# Patient Record
Sex: Male | Born: 1970
Health system: Southern US, Community
[De-identification: ages and names within clinical notes are randomized; demographics above are authoritative.]

## PROBLEM LIST (undated history)

## (undated) DIAGNOSIS — Z8709 Personal history of other diseases of the respiratory system: Secondary | ICD-10-CM

## (undated) DIAGNOSIS — D649 Anemia, unspecified: Secondary | ICD-10-CM

## (undated) DIAGNOSIS — Z992 Dependence on renal dialysis: Secondary | ICD-10-CM

## (undated) DIAGNOSIS — D573 Sickle-cell trait: Secondary | ICD-10-CM

## (undated) DIAGNOSIS — I729 Aneurysm of unspecified site: Secondary | ICD-10-CM

## (undated) DIAGNOSIS — K219 Gastro-esophageal reflux disease without esophagitis: Secondary | ICD-10-CM

## (undated) DIAGNOSIS — K5900 Constipation, unspecified: Secondary | ICD-10-CM

## (undated) DIAGNOSIS — N186 End stage renal disease: Secondary | ICD-10-CM

## (undated) DIAGNOSIS — I10 Essential (primary) hypertension: Secondary | ICD-10-CM

## (undated) HISTORY — PX: AV FISTULA PLACEMENT: SHX1204

## (undated) HISTORY — DX: End stage renal disease: N18.6

## (undated) HISTORY — PX: OTHER SURGICAL HISTORY: SHX169

## (undated) HISTORY — DX: Essential (primary) hypertension: I10

## (undated) HISTORY — DX: Dependence on renal dialysis: Z99.2

## (undated) HISTORY — DX: Aneurysm of unspecified site: I72.9

## (undated) HISTORY — PX: COLONOSCOPY: SHX174

## (undated) HISTORY — DX: Anemia, unspecified: D64.9

---

## 2004-02-20 ENCOUNTER — Inpatient Hospital Stay (HOSPITAL_COMMUNITY): Admission: EM | Admit: 2004-02-20 | Discharge: 2004-02-25 | Payer: Self-pay | Admitting: *Deleted

## 2004-02-23 DIAGNOSIS — Z992 Dependence on renal dialysis: Secondary | ICD-10-CM

## 2004-02-23 HISTORY — DX: Dependence on renal dialysis: Z99.2

## 2004-11-02 ENCOUNTER — Ambulatory Visit (HOSPITAL_COMMUNITY): Admission: RE | Admit: 2004-11-02 | Discharge: 2004-11-02 | Payer: Self-pay | Admitting: Vascular Surgery

## 2007-02-15 ENCOUNTER — Ambulatory Visit: Payer: Self-pay | Admitting: *Deleted

## 2007-02-15 DIAGNOSIS — I729 Aneurysm of unspecified site: Secondary | ICD-10-CM

## 2007-02-15 HISTORY — DX: Aneurysm of unspecified site: I72.9

## 2007-05-22 ENCOUNTER — Ambulatory Visit: Payer: Self-pay | Admitting: Vascular Surgery

## 2007-08-07 ENCOUNTER — Ambulatory Visit: Payer: Self-pay | Admitting: Surgery

## 2007-08-07 ENCOUNTER — Ambulatory Visit (HOSPITAL_COMMUNITY): Admission: RE | Admit: 2007-08-07 | Discharge: 2007-08-07 | Payer: Self-pay | Admitting: Surgery

## 2007-08-14 ENCOUNTER — Ambulatory Visit: Payer: Self-pay | Admitting: Vascular Surgery

## 2008-08-04 ENCOUNTER — Observation Stay (HOSPITAL_COMMUNITY): Admission: AD | Admit: 2008-08-04 | Discharge: 2008-08-05 | Payer: Self-pay | Admitting: Internal Medicine

## 2008-10-16 ENCOUNTER — Ambulatory Visit: Payer: Self-pay | Admitting: *Deleted

## 2009-06-25 ENCOUNTER — Ambulatory Visit: Payer: Self-pay | Admitting: Vascular Surgery

## 2009-07-30 ENCOUNTER — Ambulatory Visit: Payer: Self-pay | Admitting: Vascular Surgery

## 2010-06-29 ENCOUNTER — Ambulatory Visit: Payer: Self-pay | Admitting: Vascular Surgery

## 2011-03-08 NOTE — Assessment & Plan Note (Signed)
OFFICE VISIT   KOLVIN, [name]  DOB:  [birth year]                                       07/30/2009  [id number]   I saw the patient in the office today for continued followup of his  right upper arm AV fistula.  I had evaluated these on 06/25/2009.  He  had some large aneurysms of his right upper arm AV fistula, and I  favored placing new access in his left arm and then once this was usable  addressing the aneurysms and essentially excising them.  However, the  patient states that he has had such good luck with his fistula, he was  reluctant to do anything about this.  He takes meticulously good care of  his fistula and keeps a very close eye on the aneurysms.   He comes in today to have his fistula checked again.  The areas of  superficial ulceration over the aneurysms appear to be gradually  improving.  There is no erythema or drainage.  The fistula has an  excellent thrill.   He will continue to keep the fistula well protected and to apply  bacitracin to the small eschars and keep them covered.  Hopefully these  will gradually contract and heal in and he can continue to use his  fistula.  I have explained that certainly if these increase in size at  all, we would need to proceed with placement of new access and excise  these large aneurysms.  I plan on seeing him back in 3 months.  He knows  to call sooner if he has problems.   Judeth Cornfield. Scot Dock, M.D.  Electronically Signed   CSD/MEDQ  D:  07/30/2009  T:  07/31/2009  Job:  [id number]

## 2011-03-08 NOTE — Assessment & Plan Note (Signed)
OFFICE VISIT   STRAN, BECTON  DOB:  05-08-1971                                       08/14/2007  [id number]   Patient is a 40 year old male patient with end-stage renal disease with  a right upper arm AV fistula which I  created in 2005.  The fistula has  functioned nicely but has become aneurysmally dilated in a few areas.  We have evaluated him twice in the past six months for that problem.  Apparently he had some superficial infection over one of the areas of  dilatation but has had no bleeding.  He was treated with Levaquin via a  Port-A-Cath.   PHYSICAL EXAMINATION:  He has three areas of aneurysmal dilatation in  the right upper arm fistula, which does have an excellent pulse and  palpable thrill.  There is no evidence of steal distally.  There is one  area of very slight excoriation at the apex of one of these aneurysms,  measuring about 3 x 4 mm, which is healing.  There is no evidence of any  active infection at this time.  No purulent drainage or cellulitis.   I discussed at length with the patient the options, including creating a  fistula in the contralateral left upper arm versus trying to get some  more use out of this fistula because of his young age, and he would like  to continue to use this fistula.  We discussed the very small incidence  of bleeding from this, and he understands that and would like to  continue to utilize this fistula for  the time being.  If he should change his feelings about this or if renal  physicians would like [place] to proceed with a left upper arm fistula, would  be happy to do so.   Nelda Severe Kellie Simmering, M.D.  Electronically Signed   JDL/MEDQ  D:  08/14/2007  T:  08/15/2007  Job:  [id number]   cc:   Sherril Croon, M.D.

## 2011-03-08 NOTE — Assessment & Plan Note (Signed)
OFFICE VISIT   [name], [name]  DOB:  1971-07-16                                       10/16/2008  [id number]   The patient is a 40 year old gentleman on chronic hemodialysis.  He had  a right upper arm arteriovenous fistula created in 2005 with revision  for competing branches in January 2006.   He is referred today for an evaluation due to an area of ulceration over  his fistula.  No history of bleeding.  Also, this has become quite  hypertrophied.   Evaluation reveals a well-appearing 40 year old [place] American male.  Alert and oriented.  No distress.  His right upper arm does reveal a  large hypertrophied fistula.  Two small areas of eschar noted over this.  They do not appear to erode deeply.  No history of bleeding.  No recent  bleeding evident.   Recommend cannulation away from these sites to allow the skin to heal.  Moving needles around.  No indication at this time for ligation of the  fistula as this is functioning well.   Dorothea Glassman, M.D.  Electronically Signed   PGH/MEDQ  D:  10/16/2008  T:  10/20/2008  Job:  [id number]   cc:   Sherril Croon, M.D.  [place]

## 2011-03-08 NOTE — Assessment & Plan Note (Signed)
OFFICE VISIT   [name], [name]  DOB:  1971-07-07                                       06/25/2009  [id number]   I saw the patient in the office today to evaluate the large aneurysms of  his right upper arm AV fistula.  This was placed in 2005.  He has  developed a superficial ulceration over two of the aneurysms that are  located more centrally.  He states that the fistula has been working  well and he has never really had any problems with this before this.  He  states that he has had the ulcers for about 2 weeks.   PAST MEDICAL HISTORY:  Significant for chronic kidney disease and  hypertension.  He denies any history of diabetes, history of previous  myocardial infarction or history of congestive heart failure.  He also  has secondary hyperparathyroidism and obesity.   SOCIAL HISTORY:  He has one child.  He works for the [employer] division of [employer].  He does not use tobacco.   REVIEW OF SYSTEMS:  Is unremarkable and is documented on the medical  history form of his chart.   PHYSICAL EXAMINATION:  This is a pleasant 40 year old gentleman who  appears his stated age.  Blood pressure is 99/66, heart rate is 86.  Lungs are clear bilaterally to auscultation.  On cardiac exam he has a  regular rate and rhythm.  His fistula has several large aneurysms, the  two more centrally located aneurysms have some superficial ulceration  overlying them.  There is no real eschar just some superficial loss of  the epidermis.  I do not see any drainage or erythema to suggest  infection.   Given the large aneurysms I would favor working on new access in the  left arm and then once this can be used we could ligate the fistula on  the right and address the aneurysms.  I have explained that the other  option would be to go ahead and ligate the fistula now and place a  catheter and place a new graft in the right arm soon after.  I  have  explained that I think there certainly is some risk of bleeding from his  aneurysms and we discussed the importance of applying pressure if he  developed any bleeding associated with these aneurysms.   The patient is very reluctant to give up on the fistula at this point  and would like at least some time to see if these superficial ulcers  heal on their own.  I do not think that this is totally unreasonable and  we had a long discussion about the importance of proper wound care  including the use of xeroform or Vaseline gauze to prevent the dressing  from sticking to the aneurysms and also trying to avoid the use of tape.  Certainly I do not think it would be a good idea to cannulate in this  area.  I plan on seeing him back in 2 weeks to be sure that these are  improving.  If they worsen then I think the best option would be to go  ahead and ligate the fistula, address the aneurysms and place a catheter  and then plan on placing a new graft once the arm is healed and we do  not have any significant problems with swelling.   I will see him back in 2 weeks.  He knows to call sooner if the ulcers  are not improving.   Judeth Cornfield. Scot Dock, M.D.  Electronically Signed   CSD/MEDQ  D:  06/25/2009  T:  06/26/2009  Job:  [id number]   cc:   Elzie Rings. Lorrene Reid, M.D.

## 2011-03-08 NOTE — Assessment & Plan Note (Signed)
OFFICE VISIT   [name], [name]  DOB:  03/13/1971                                       05/22/2007  [id number]   [name] returns today for further evaluation of his right upper arm  IV fistula.  This was evaluated by Dr. Amedeo Plenty in April 2008.  The fistula  has been present for about three years and has worked nicely over that  time.  He has developed aneurysmal dilatation as many patients do  diffusely throughout the fistula with some areas dilated up to 2.5-3 cm.  There are two superficial ulcerations in the mid portion and in the  proximal portion, neither of which has bled or been infected.  I  discussed the options with [name] today including ligation of the  fistula which would require insertion of access in his contralateral  left arm versus trying to get more time out of this fistula since it has  not bled or been infected, and he would like to proceed with more use  from the right upper arm fistula and watching carefully.  The next step  would be to create access in his other upper extremity and at some point  in time when that is functional then ligate fistula completely because  it is not revisable in the right arm.   Nelda Severe Kellie Simmering, M.D.  Electronically Signed   JDL/MEDQ  D:  05/22/2007  T:  05/24/2007  Job:  [id number]

## 2011-03-08 NOTE — Assessment & Plan Note (Signed)
OFFICE VISIT   CAMIREN, DORWARD  DOB:  Oct 15, 1971                                       06/29/2010  [id number]   The patient presents today for evaluation of his right arm AV fistula.  This was initially placed by Dr. Ruel Favors in 2005.  He has had  outstanding results with this since that time.  He in looking back in  his office notes has had multiple evaluations in our office, average  once a year where he has superficial ulcerations over his vein.  These  have all healed eventually in the past.  The patient is a very pleasant  young man and he reports that despite his best intentions does have a  habit of picking at these small eschars where he has had puncture.  He  has not had any bleeding from these.  His history otherwise is unchanged  from prior evaluations.   PHYSICAL EXAM:  General:  A well-developed obese black male in no acute  distress.  Vital signs:  Blood pressure is 124/71, heart rate is 108,  temperature is 98.  He is well-nourished and in no acute distress.  HEENT:  Normal.  Musculoskeletal:  Shows no major deformities or  cyanosis.  Neurological:  No focal weakness or paresthesias.  Skin:  Without ulcers or rashes.  Aside from an evaluation of his right upper  arm AV fistula he does have two areas that appear to be superficial over  the fistula.  He has three large venous aneurysms basically making up  the entire fistula.  He reports that he is not having any difficulty  accessing this at the dialysis center.   I have recommended that he continue to keep a close eye on this and to  avoid traumatizing the vein.  I would recommend continued use of his  fistula as long as possible, feel it is very unlikely that he will ever  have this long of a durable outcome in the future and he understands  this.  We will see him again on an as-needed basis and he knows to  notify us should he develop any worsening ulceration or skin breakdown.  I  did discuss the emergent treatment should he have any bleeding from  this with direct pressure, specifically over the bleeding site until he  can seek emergency help.  He will see [place] on an as-needed basis.     Rosetta Posner, M.D.  Electronically Signed   TFE/MEDQ  D:  06/29/2010  T:  06/30/2010  Job:  [id number]   cc:   Sherril Croon, M.D.  [facility]

## 2011-03-08 NOTE — Discharge Summary (Signed)
[name], [name]               ACCOUNT NO.:  [id number]   MEDICAL RECORD NO.:  [id number]          PATIENT TYPE:  INP   LOCATION:  [id number]                         FACILITY:  [facility]   PHYSICIAN:  Vernell Leep, MD     DATE OF BIRTH:  08/18/1971   DATE OF ADMISSION:  08/04/2008  DATE OF DISCHARGE:  08/05/2008                               DISCHARGE SUMMARY   PRIMARY MEDICAL DOCTOR:  Althia Forts.   PRIMARY NEUROLOGIST:  Dr. Edrick Oh at the [facility].   DISCHARGE DIAGNOSES:  1. Abdominal pain, unclear etiology.  Resolved.  2. ? Acute pancreatitis.  3. End-stage renal disease on hemodialysis.  4. Hypertension.  5. Secondary hyperparathyroidism.  6. Obesity.  7. Anemia - resolved.   DISCHARGE MEDICATIONS:  Unchanged from the ones prior to admission which  include:  1. Tums 2 tablets p.o. t.i.d. before meals.  2. Sensipar 30 mg p.o. daily.  3. Fosrenol 1000 mg with each meal t.i.d.  4. Nephro-Vite1 p.o. daily.   PROCEDURES:  CT of the abdomen and pelvis without contrast done at  [facility] in [place].  I called this morning for this report.  1. Urogram.  Impression:  Normal unenhanced CT of the pelvis without      evidence of obstructive uropathy or urethral calculi.  2. CT of the abdomen without contrast.  Negative unenhanced CT of the      abdomen with small shrunken kidneys consistent with medical renal      disease.  No evidence of obstructive uropathy.  3. Ultrasound of the abdomen on 08/05/2008.  Impression:  A)  No      evidence of gallstones, biliary dilatation or other acute findings.      B) small echogenic kidneys, consistent with end-stage renal      disease.   PERTINENT LABORATORIES:  Lipase 37, amylase 287, complete metabolic  panel is remarkable for BUN 31, creatinine 12.5.  LFTs with total  protein 7.8, albumin 3.7, AST 157, ALT 35, total bilirubin 1.4, alkaline  phosphatase 39.  CBCs with hemoglobin 15.5, hematocrit 45.2, white blood  cell 8.8, platelets 203.   CONSULTATIONS:  None.   HOSPITAL COURSE/PATIENT DISPOSITION:  Please refer to the history and  physical note for initial admission details.  In summary, Mr. Schadler is  a very pleasant 40 year old African American man patient with history of  end-stage renal disease on dialysis at the [facility] on  Mondays, Wednesdays and Fridays who presented with history of abdominal  pain which began at 11 a.m. on the day of admission.  This was not  associated with nausea, vomiting.  He is not a heavy alcohol consumer.  There were no recent change in his home medications.  The current  medications he has been taking for approximately 4 years.  In any event,  he was evaluated at the [facility] and was found to have a lipase  of 1263.  Although the CT of the abdomen and pelvis was negative and his  LFT showed AST 55 and ALT 20.  He was thereby transferred for management  of possible acute pancreatitis.  Patient was admitted to the medical  floor.  Patient indicates that since 7:30 p.m. last night, patient has  had no pains or any other symptoms.  This morning he is without  complaints.  He is hemodynamically stable.  His abdomen is soft.  Unclear if his high lipase at the [facility] was true versus a  lab error.  In any event, we will advance his diet to low-fat diet as  tolerated and if he does tolerate this, he will be discharged today to  follow up with his renal physicians at the [facility].  He  is advised to seek immediate medical attention if there is any  recurrence of symptoms.  We will add fasting lipids to his a.m. labs.  Also, to evaluate his uric acid at the dialysis center.  He has mildly  elevated AST. Consider evaluation by a gastroenterologist for this in  the context of this transient abdominal pain. D/W Dr. Justin Mend.      Vernell Leep, MD  Electronically Signed     AH/MEDQ  D:  08/05/2008  T:  08/05/2008  Job:   [id number]   cc:   Sherril Croon, M.D.

## 2011-03-08 NOTE — H&P (Signed)
[name], [name]               ACCOUNT NO.:  [id number]   MEDICAL RECORD NO.:  [id number]          PATIENT TYPE:  INP   LOCATION:  NA                           FACILITY:  [facility]   PHYSICIAN:  Sharlet Salina, M.D.   DATE OF BIRTH:  [birth year]   DATE OF ADMISSION:  DATE OF DISCHARGE:                              HISTORY & PHYSICAL   CHIEF COMPLAINT:  Abdominal pain.   HISTORY OF PRESENT ILLNESS:  The patient is a 40 year old African  American male with end-stage renal disease.  The patient stated that he  was at dialysis in [place] this morning when he developed some pain on  the right side of the abdomen; that was about 11:00 a.m. today.  He had  no nausea, no vomiting.  He stated that normally when he gets a little  pain with dialysis it will go away, but this pain was persistent.  He  ate two pickles, thinking that, that would make it better, but the pain  got worse.  He states that he does not drink alcohol.  The last time he  had alcohol was about 2 weeks ago.  He had a beer in a mixed drink at a  reunion.   PAST MEDICAL HISTORY:  1. Significant for end-stage renal disease, hemodialysis Monday,      Wednesday, Friday in [place].  2. Hypertension.  3. Anemia.  4. Obesity.  5. Secondary hyperparathyroidism.   FAMILY HISTORY:  Mother has end-stage renal disease secondary to  hypertension.  She also has sickle cell anemia.  Father's history is  unknown.   SOCIAL HISTORY:  The patient is single.  He has one son.  No tobacco  use.  He drinks alcohol socially.  He works in the [employer] in [place].   MEDICATIONS:  1. Tums 2 with each meal three times daily.  2. Sensipar 30 mg daily.  3. Fosrenol 1000 mg with each meal three times a day.  4. Nephro-Vite daily.   ALLERGIES:  NONE.   REVIEW OF SYSTEMS:  Negative, otherwise stated in the HPI.   PHYSICAL EXAMINATION:  VITAL SIGNS:  Temperature 97, blood pressure  105/67, heart rate  76, respirations 18.  Pulse ox 99%.  GENERAL:  Patient lying in bed no acute distress.  HEENT:  Normocephalic, atraumatic.  Pupils reactive to light.  Throat  without erythema.  CARDIOVASCULAR:  Regular rate and rhythm with a 2/6  systolic murmur.  LUNGS:  Clear bilaterally.  ABDOMEN:  Positive bowel sounds.  Soft, nontender, nondistended.  EXTREMITIES:  Without edema.  He does have a good thrill over his  fistula on the right.   LABORATORY DATA:  WBC 9.7, hemoglobin 15.9, MCV of 94, platelets 201.  Sodium 142, potassium 4.8, chloride 96, CO2 of 28, glucose 96, BUN 29,  creatinine 9.98, total protein 9.9, bilirubin 0.9, AST 55, ALT 20.  Alk  phos 64, lipase 1263.  CT scan non-contrast of the abdomen and pelvis  negative, except for small shrunken kidney.   ASSESSMENT/PLAN:  1. Acute pancreatitis.  2. End-stage renal disease.  3. Obesity.  4. Secondary hyperparathyroidism.  5. Anemia.   I am unsure of the etiology of his acute pancreatitis.  He does not  drink alcohol.  He is not on hydrochlorothiazide.  I will have to check  to see if any of the medications that he is on can cause acute  pancreatitis.  Will make him n.p.o. for now.  He is pain free at  present.  Will recheck his lipase in the morning and his amylase as  well.  Will get a right upper quadrant ultrasound as well, but he is  totally asymptomatic, no abdominal pain or tenderness at present.  Will  also ask nephrology to see him tomorrow for his dialysis on Wednesday.      Sharlet Salina, M.D.  Electronically Signed     NJ/MEDQ  D:  08/04/2008  T:  08/04/2008  Job:  [id number]

## 2011-03-08 NOTE — Op Note (Signed)
[name], [name]               ACCOUNT NO.:  [id number]   MEDICAL RECORD NO.:  [id number]          PATIENT TYPE:  AMB   LOCATION:  SDS                          FACILITY:  [facility]   PHYSICIAN:  Theotis Burrow IV, MDDATE OF BIRTH:  [birth year]   DATE OF PROCEDURE:  08/07/2007  DATE OF DISCHARGE:                               OPERATIVE REPORT   PREOPERATIVE DIAGNOSIS:  End stage renal disease.   POSTOPERATIVE DIAGNOSIS:  End stage renal disease.   PROCEDURE PERFORMED:  Right internal jugular Diatek catheter placement.   TYPE OF ANESTHESIA:  MAC.   BLOOD LOSS:  Minimal.   INDICATIONS:  This is a 40 year old with end stage renal disease who has  been dialyzing through a right upper arm fistula that has become  infected.  Plan is for a Diatek catheter today.   After obtaining informed consent, the patient was taken to room 8.  He  was placed supine on the table.  MAC anesthesia was induced.  The  patient was then prepped and draped in standard, sterile fashion.  A  timeout was called.  Perioperative antibiotics were administered.  Using  ultrasound, the right internal jugular vein was accessed with an 18-  gauge needle after the skin and subcutaneous tissue had been  anesthetized with 1% lidocaine.  An 0.035 wire was then advanced without  resistance into the right atrium.  Next, the sequential dilators were  used to dilate the subcutaneous tract.  Over the wire, the peel-away  sheath was then placed.  A 32-cm catheter was selected.  It was advanced  through the peel-away sheath which was then removed.  The catheter tip  was then evaluated to be in the right atrium.  A skin nick was made at  the intended location for the cuff of the catheter.  A subcutaneous  tunnel was then created and then dilated with the dilator that comes  with the kit.  The catheter was then brought through the subcutaneous  tunnel and the cuff was placed at the skin exit site.  Fluoroscopic  images were  then obtained to ensure there were no kinks within the  catheter.  The tip was in good position.  Both lumens flushed and  aspirated without difficulty.  The catheter was then sutured into  position.  Sterile dressings were applied.  The patient tolerated the  procedure well.  There were no immediate complications.      Eldridge Abrahams, MD  Electronically Signed     VWB/MEDQ  D:  08/07/2007  T:  08/07/2007  Job:  [id number]

## 2011-03-11 NOTE — Discharge Summary (Signed)
NAMEZACKRY, ZEPHIR                           ACCOUNT NO.:  [id number]   MEDICAL RECORD NO.:  [id number]                   PATIENT TYPE:  INP   LOCATION:  5501                                 FACILITY:  [facility]   PHYSICIAN:  Sol Blazing, M.D.             DATE OF BIRTH:  03-28-1971   DATE OF ADMISSION:  02/20/2004  DATE OF DISCHARGE:  02/25/2004                                 DISCHARGE SUMMARY   ADMISSION DIAGNOSES:  1. Renal failure acute on chronic.  2. Hypertension.  3. Anemia.   DISCHARGE DIAGNOSES:  1. End-stage renal disease with new onset hemodialysis.  2. Hypertension.  3. Anemia of chronic disease.  4. Secondary hyperparathyroidism.  5. Obesity.  6. Iron-deficiency anemia.   BRIEF HISTORY/REASON FOR ADMISSION:  Mr. Yeagley is a 40 year old black male  with history of hypertension, questionable  __________ factor, who presents  after outpatient blood tests revealed a creatinine of 16.9.  Prior  creatinine in April, 2003 was 2.1.  The patient was turning to his primary  care physician for followup of hypertension noted on ED visit on February 12, 2004 when seen for foot pain which resolved.  His blood pressure at that  time was 207/111.  The patient states he received Norvasc and Vicodin  prescriptions from the emergency room, and reports he has been off his blood  pressure medications for at least one year.  He took Altace and  hydrochlorothiazide in the past, was prescribed Norvasc but never filled it.   He gave a history of hypertension for 3 to 4 years.  Again denied any NSAID.  Reports his mother is on hemodialysis  with history of hypertension and  sickle cell.  On presentation physical by the renal resident, he had a blood  pressure of 148/86.  He was alert, appropriate, in no acute distress.  Lungs  were clear to auscultation and cardiac exam had a regular rate and rhythm,  no rub appreciated.  Abdomen was soft, nontender, no bruits appreciated.  Extremities  revealed 2+ pedal edema.  On abdominal exam he was obese, bowel  sounds positive, no bruits were appreciated.  Neurologically no asterixis  was appreciated.  Admission labs showed a hemoglobin of 9.5.  A phosphorus  of 7.0, BUN of 82, creatinine 16.2, potassium of 4.3, CO2 was 21, calcium  was 6.0.  He was admitted by Dr. Jonnie Finner and renal resident to evaluate his  acute renal failure with history of chronic renal insufficiency by notes  from [facility].  Renal ultrasound was ordered to rule  out obstruction.  Dr. Jonnie Finner thought his progressive renal disease was  secondary to hypertension and/or primary __________ disease.  Will give IV  fluids, check his UA, ultrasound, follow up renal function if no response to  IV fluids, will need to prepare for dialysis treatment.   HOSPITAL COURSE:  The patient's renal ultrasound showed  small echogenic  kidneys compatible with chronic medical renal disease, no hydronephrosis or  mass appreciated.  The chest x-ray showed no volume overload.  Cardiomegaly  and no active lung disease.  With IV fluids his creatinine was no better.  HIV was nonreactive.  Noted PTH intact was 828.6.  Bence-Jones protein was  negative.  Urine was negative for monoclonal free light chains.  Urinalysis  showed more than 300 mg/dl of protein.  Granular casts present, 0-2 rbcs and  white blood cells.  Specific gravity was 1.001.  Culture showed no growth.  ANA was negative.  It was thought that his renal failure was due to  hypertensive disease.   PROBLEM 1.  Therefore arrangements were made for hemodialysis.  A Diatek  catheter to be placed by CV[facility].  This was done with a right upper arm A-V  fistula inserted by Dr. Kellie Simmering on Feb 23, 2004.  Postop no signs of steal  syndrome.  His hemodialysis Perm-Cath functioned without difficulty with  hemodialysis performed on May 2, May 3, Feb 25, 2004.  Arrangements were made  for outpatient hemodialysis at [facility] with Tuesday, Thursday,  Saturday schedule.   PROBLEM 2.  Hypertension.  The patient's blood pressure improved with volume  removal on hemodialysis.  Blood pressure medications begun on admission of  Labetalol and Norvasc were tapered down at time of discharge.  He was only  on Norvasc 5 mg at h.s.  To be held if his blood pressure is low.  It should  be noted he did require some IV fluids at the time of discharge because of  hypotension post-hemodialysis.  Blood pressure and dry weights will be  adjusted at the [facility].   PROBLEM 3.  Anemia.  As noted, his hemoglobin was below 10 on admission.  Stool was checked which was heme negative.  Also noted to have a transferrin  saturation of 40, ferritin was 435, and TIBC was low at 206.  He was placed  on Epogen at 10,000 units IV q. Dialysis and will be on 50 mg IV weekly of  InFeD.  Hemoglobin remained stable at 9.6 at the time of discharge.  He did  not require any transfusion.   PROBLEM 4.  Secondary hyperparathyroidism.  Noted his PTH intact was 828.6.  He was placed on Calcitrol 0.5 mcg IV q. Hemodialysis at the time of  discharge.  He was placed on Tums 3 each meal for phosphate binder, with a  calcium up to 7.3 with an albumin of 3.6 and his phosphorus was down to 6.3  at the time of discharge.  Calcium, phosphorus and PTH are to be followed up  at the [facility] as an outpatient.   It should be noted he had a 24-hour urine protein came back near discharge  at 5.2 grams, implying chronic gene, questionable type, rather than elevated  blood pressures causing his hemodialysis.  This will be followed up at the  dialysis unit.  Again, blood pressure was responding nicely to volume  removal.   DISCHARGE MEDICATIONS:  1. Nephrite 1 daily.  2. Norvasc 5 mg at h.s., hold if blood pressure is low.  3. Tums Ultra 3 a.c.  4. Quinine sulfate 325 mg q.8h p.r.n. cramping. 5. InFeD q. week at dialysis.  6. Calcitrol  0.5 mcg IV q. dialysis.  7. Epogen 10,000 units IV q. dialysis.   Renal failure was discussed with the patient at the time of discharge.  He  was instructed to keep his PermCath dry and clean with dressing changes at  dialysis unit.  Told to exercise his fistula starting one week after  insertion.  Dry weight is 138 kg., and to be tapered at the dialysis unit  and adjusted there.   FOLLOW UP:  [facility] dialysis unit Tuesday, Thursday, Saturday.      Foye Clock, P.A.                    Sol Blazing, M.D.    DWZ/MEDQ  D:  03/29/2004  T:  03/30/2004  Job:  [id number]   cc:   [facility]   [place], [place]

## 2011-03-11 NOTE — Op Note (Signed)
NAMERHET, BERNOSKY               ACCOUNT NO.:  [id number]   MEDICAL RECORD NO.:  [id number]          PATIENT TYPE:  OIB   LOCATION:  2899                         FACILITY:  [facility]   PHYSICIAN:  Rosetta Posner, M.D.    DATE OF BIRTH:  [birth year]   DATE OF PROCEDURE:  11/02/2004  DATE OF DISCHARGE:                                 OPERATIVE REPORT   PREOPERATIVE DIAGNOSIS:  End-stage renal disease with competing branch of  ___left_______ upper arm arteriovenous fistula.   POSTOPERATIVE DIAGNOSIS:  End-stage renal disease with competing branch of  ____left______ upper arm arteriovenous fistula.   OPERATION PERFORMED:  Ligation of competing branch of __left______ upper arm  arteriovenous fistula.   SURGEON:  Rosetta Posner, M.D.   ASSISTANT:  John Giovanni, P.A.-C.   ANESTHESIA:  MAC.   COMPLICATIONS:  None.   DISPOSITION:  Recovery room stable.   DESCRIPTION OF PROCEDURE:  The patient was taken to the operating room and  placed in supine position where the area of the ____left______ arm was  prepped and draped in the usual sterile fashion.  The venogram had shown a  large competing branch of his arteriovenous fistula near the venous  anastomosis.  There was no evidence of proximal stenosis.  Incision was made  at the brachial artery anastomosis and the vein was exposed at this level  and the fistula branch could not be reached from this site.  A separate  small incision was made using local anesthesia, more proximally over the  area of the vein.  The competing vein was easily visualized and was quite  large.  This was ligated with 2-0 silk suture.  There remained excellent  flow through the cephalic vein fistula.  Wounds irrigated with saline,  hemostasis electrocautery.  Wounds closed with 3-0 Vicryl in subcutaneous  and subcuticular tissue.  Benzoin and Steri-Strips were applied.                                               ______________________________  Rosetta Posner,  M.D.  Electronically Signed 11/15/2004 01:56:32pm EST    TFE/MEDQ  D:  11/02/2004  T:  11/02/2004  Job:  [id number]

## 2011-03-11 NOTE — Op Note (Signed)
NAMERIZWAN, MAMMONE                           ACCOUNT NO.:  [id number]   MEDICAL RECORD NO.:  [id number]                   PATIENT TYPE:  INP   LOCATION:  5501                                 FACILITY:  [facility]   PHYSICIAN:  Nelda Severe. Kellie Simmering, M.D.               DATE OF BIRTH:  1971-10-05   DATE OF PROCEDURE:  02/23/2004  DATE OF DISCHARGE:                                 OPERATIVE REPORT   PREOPERATIVE DIAGNOSIS:  End-stage renal disease.   POSTOPERATIVE DIAGNOSIS:  End-stage renal disease.   OPERATION:  1. Creation of a right upper arm brachial artery to cephalic vein     arteriovenous fistula (Kauffman shunt).  2. Bilateral ultrasound localization, internal jugular veins.  3. Insertion of Diatek catheter, right internal jugular vein (28 cm).   SURGEON:  Nelda Severe. Kellie Simmering, M.D.   FIRST ASSISTANT:  Claudette TLevie Heritage, N.P.   ANESTHESIA:  Local.   PROCEDURE:  The patient was taken to the operating room and placed in the  supine position, at which time the right upper extremity was prepped with  Betadine scrub and solution and draped in the routine sterile manner.  After  infiltration with 1% Xylocaine with epinephrine, a transverse incision was  made in the antecubital area, antecubital vein dissected free.  The cephalic  vein was excellent, being at least 4 mm in size.  The brachial artery was  exposed beneath the fascia and encircled with vessel loops, and it was an  excellent artery.  The vein was dilated with heparinized saline and could be  palpated up to the deltopectoral groove in the shoulder.  It was decided to  proceed with a fistula.  The vein was transected distally after ligating it,  3000 units of heparin given intravenously.  Brachial artery was occluded  proximally and distally with vessel loops, opened with a 15 blade, extended  with the Potts scissors.  The cephalic vein was spatulated and anastomosed  end-to-side with 6-0 Prolene.  Vessel loops then released and  there was a  palpable pulse and a thrill in the operative site and good Doppler flow up  to the shoulder in the cephalic vein.  Protamine was given to reverse the  heparin.  Following adequate hemostasis, the wound was irrigated with saline  and closed in layers with Vicryl in a subcuticular fashion.  Attention  turned to the upper chest and neck, which were then exposed, and both  internal jugular veins were imaged with the B mode ultrasound device.  Both  were noted to be patent and normal in appearance.  After prepping and  draping in a routine sterile manner, the right internal jugular vein was  entered using a supraclavicular approach.  A guidewire wire passed into the  right atrium under fluoroscopic guidance.  After dilating the tract  appropriately over the guidewire, a 28 cm Diatek catheter was passed through  a peel-away  sheath positioned in the right atrium and the sheath removed.  The catheter was then tunneled peripherally, secured with nylon sutures, and  the wound closed with Vicryl in a subcuticular fashion.  A sterile dressing  applied, the patient taken to the recovery room in satisfactory condition.                                               Nelda Severe Kellie Simmering, M.D.    JDL/MEDQ  D:  02/23/2004  T:  02/23/2004  Job:  [id number]

## 2011-07-25 LAB — COMPREHENSIVE METABOLIC PANEL
ALT: 35
Albumin: 3.7
Alkaline Phosphatase: 39
BUN: 31 — ABNORMAL HIGH
CO2: 29
Chloride: 97
GFR calc non Af Amer: 5 — ABNORMAL LOW
Total Protein: 7.8

## 2011-07-25 LAB — CBC
HCT: 45.2
Hemoglobin: 15.5
RBC: 4.78
WBC: 8.8

## 2011-07-25 LAB — LIPASE, BLOOD: Lipase: 37

## 2011-07-25 LAB — LIPID PANEL: LDL Cholesterol: 84

## 2011-07-25 LAB — AMYLASE: Amylase: 287 — ABNORMAL HIGH

## 2011-08-04 LAB — POCT I-STAT 4, (NA,K, GLUC, HGB,HCT)
Hemoglobin: 16.7
Operator id: 274481
Potassium: 4.2
Sodium: 134 — ABNORMAL LOW

## 2011-10-26 DIAGNOSIS — N186 End stage renal disease: Secondary | ICD-10-CM | POA: Diagnosis not present

## 2011-10-26 DIAGNOSIS — N2581 Secondary hyperparathyroidism of renal origin: Secondary | ICD-10-CM | POA: Diagnosis not present

## 2011-11-24 DIAGNOSIS — N186 End stage renal disease: Secondary | ICD-10-CM | POA: Diagnosis not present

## 2011-11-25 DIAGNOSIS — N2581 Secondary hyperparathyroidism of renal origin: Secondary | ICD-10-CM | POA: Diagnosis not present

## 2011-11-25 DIAGNOSIS — N186 End stage renal disease: Secondary | ICD-10-CM | POA: Diagnosis not present

## 2011-11-28 DIAGNOSIS — N2581 Secondary hyperparathyroidism of renal origin: Secondary | ICD-10-CM | POA: Diagnosis not present

## 2011-11-28 DIAGNOSIS — N186 End stage renal disease: Secondary | ICD-10-CM | POA: Diagnosis not present

## 2011-11-29 DIAGNOSIS — N2581 Secondary hyperparathyroidism of renal origin: Secondary | ICD-10-CM | POA: Diagnosis not present

## 2011-11-29 DIAGNOSIS — N186 End stage renal disease: Secondary | ICD-10-CM | POA: Diagnosis not present

## 2011-12-02 DIAGNOSIS — N2581 Secondary hyperparathyroidism of renal origin: Secondary | ICD-10-CM | POA: Diagnosis not present

## 2011-12-02 DIAGNOSIS — N186 End stage renal disease: Secondary | ICD-10-CM | POA: Diagnosis not present

## 2011-12-05 DIAGNOSIS — N2581 Secondary hyperparathyroidism of renal origin: Secondary | ICD-10-CM | POA: Diagnosis not present

## 2011-12-05 DIAGNOSIS — N186 End stage renal disease: Secondary | ICD-10-CM | POA: Diagnosis not present

## 2011-12-07 DIAGNOSIS — N186 End stage renal disease: Secondary | ICD-10-CM | POA: Diagnosis not present

## 2011-12-07 DIAGNOSIS — N2581 Secondary hyperparathyroidism of renal origin: Secondary | ICD-10-CM | POA: Diagnosis not present

## 2011-12-09 DIAGNOSIS — N186 End stage renal disease: Secondary | ICD-10-CM | POA: Diagnosis not present

## 2011-12-09 DIAGNOSIS — N2581 Secondary hyperparathyroidism of renal origin: Secondary | ICD-10-CM | POA: Diagnosis not present

## 2011-12-12 DIAGNOSIS — N186 End stage renal disease: Secondary | ICD-10-CM | POA: Diagnosis not present

## 2011-12-12 DIAGNOSIS — N2581 Secondary hyperparathyroidism of renal origin: Secondary | ICD-10-CM | POA: Diagnosis not present

## 2011-12-14 DIAGNOSIS — N2581 Secondary hyperparathyroidism of renal origin: Secondary | ICD-10-CM | POA: Diagnosis not present

## 2011-12-14 DIAGNOSIS — N186 End stage renal disease: Secondary | ICD-10-CM | POA: Diagnosis not present

## 2011-12-16 DIAGNOSIS — N186 End stage renal disease: Secondary | ICD-10-CM | POA: Diagnosis not present

## 2011-12-16 DIAGNOSIS — N2581 Secondary hyperparathyroidism of renal origin: Secondary | ICD-10-CM | POA: Diagnosis not present

## 2011-12-19 DIAGNOSIS — N2581 Secondary hyperparathyroidism of renal origin: Secondary | ICD-10-CM | POA: Diagnosis not present

## 2011-12-19 DIAGNOSIS — N186 End stage renal disease: Secondary | ICD-10-CM | POA: Diagnosis not present

## 2011-12-21 DIAGNOSIS — N186 End stage renal disease: Secondary | ICD-10-CM | POA: Diagnosis not present

## 2011-12-21 DIAGNOSIS — N2581 Secondary hyperparathyroidism of renal origin: Secondary | ICD-10-CM | POA: Diagnosis not present

## 2011-12-22 DIAGNOSIS — N186 End stage renal disease: Secondary | ICD-10-CM | POA: Diagnosis not present

## 2011-12-23 DIAGNOSIS — D539 Nutritional anemia, unspecified: Secondary | ICD-10-CM | POA: Diagnosis not present

## 2011-12-23 DIAGNOSIS — N2581 Secondary hyperparathyroidism of renal origin: Secondary | ICD-10-CM | POA: Diagnosis not present

## 2011-12-23 DIAGNOSIS — N186 End stage renal disease: Secondary | ICD-10-CM | POA: Diagnosis not present

## 2011-12-26 DIAGNOSIS — N186 End stage renal disease: Secondary | ICD-10-CM | POA: Diagnosis not present

## 2011-12-26 DIAGNOSIS — N2581 Secondary hyperparathyroidism of renal origin: Secondary | ICD-10-CM | POA: Diagnosis not present

## 2011-12-26 DIAGNOSIS — D539 Nutritional anemia, unspecified: Secondary | ICD-10-CM | POA: Diagnosis not present

## 2011-12-28 DIAGNOSIS — D539 Nutritional anemia, unspecified: Secondary | ICD-10-CM | POA: Diagnosis not present

## 2011-12-28 DIAGNOSIS — N186 End stage renal disease: Secondary | ICD-10-CM | POA: Diagnosis not present

## 2011-12-28 DIAGNOSIS — N2581 Secondary hyperparathyroidism of renal origin: Secondary | ICD-10-CM | POA: Diagnosis not present

## 2011-12-30 DIAGNOSIS — N186 End stage renal disease: Secondary | ICD-10-CM | POA: Diagnosis not present

## 2011-12-30 DIAGNOSIS — D539 Nutritional anemia, unspecified: Secondary | ICD-10-CM | POA: Diagnosis not present

## 2011-12-30 DIAGNOSIS — N2581 Secondary hyperparathyroidism of renal origin: Secondary | ICD-10-CM | POA: Diagnosis not present

## 2012-01-02 DIAGNOSIS — N2581 Secondary hyperparathyroidism of renal origin: Secondary | ICD-10-CM | POA: Diagnosis not present

## 2012-01-02 DIAGNOSIS — D539 Nutritional anemia, unspecified: Secondary | ICD-10-CM | POA: Diagnosis not present

## 2012-01-02 DIAGNOSIS — N186 End stage renal disease: Secondary | ICD-10-CM | POA: Diagnosis not present

## 2012-01-04 DIAGNOSIS — N186 End stage renal disease: Secondary | ICD-10-CM | POA: Diagnosis not present

## 2012-01-04 DIAGNOSIS — D539 Nutritional anemia, unspecified: Secondary | ICD-10-CM | POA: Diagnosis not present

## 2012-01-04 DIAGNOSIS — N2581 Secondary hyperparathyroidism of renal origin: Secondary | ICD-10-CM | POA: Diagnosis not present

## 2012-01-06 DIAGNOSIS — N2581 Secondary hyperparathyroidism of renal origin: Secondary | ICD-10-CM | POA: Diagnosis not present

## 2012-01-06 DIAGNOSIS — N186 End stage renal disease: Secondary | ICD-10-CM | POA: Diagnosis not present

## 2012-01-06 DIAGNOSIS — D539 Nutritional anemia, unspecified: Secondary | ICD-10-CM | POA: Diagnosis not present

## 2012-01-09 DIAGNOSIS — D539 Nutritional anemia, unspecified: Secondary | ICD-10-CM | POA: Diagnosis not present

## 2012-01-09 DIAGNOSIS — N186 End stage renal disease: Secondary | ICD-10-CM | POA: Diagnosis not present

## 2012-01-09 DIAGNOSIS — N2581 Secondary hyperparathyroidism of renal origin: Secondary | ICD-10-CM | POA: Diagnosis not present

## 2012-01-11 DIAGNOSIS — N186 End stage renal disease: Secondary | ICD-10-CM | POA: Diagnosis not present

## 2012-01-11 DIAGNOSIS — N2581 Secondary hyperparathyroidism of renal origin: Secondary | ICD-10-CM | POA: Diagnosis not present

## 2012-01-11 DIAGNOSIS — D539 Nutritional anemia, unspecified: Secondary | ICD-10-CM | POA: Diagnosis not present

## 2012-01-13 DIAGNOSIS — N186 End stage renal disease: Secondary | ICD-10-CM | POA: Diagnosis not present

## 2012-01-13 DIAGNOSIS — N2581 Secondary hyperparathyroidism of renal origin: Secondary | ICD-10-CM | POA: Diagnosis not present

## 2012-01-13 DIAGNOSIS — D539 Nutritional anemia, unspecified: Secondary | ICD-10-CM | POA: Diagnosis not present

## 2012-01-16 DIAGNOSIS — N2581 Secondary hyperparathyroidism of renal origin: Secondary | ICD-10-CM | POA: Diagnosis not present

## 2012-01-16 DIAGNOSIS — N186 End stage renal disease: Secondary | ICD-10-CM | POA: Diagnosis not present

## 2012-01-16 DIAGNOSIS — D539 Nutritional anemia, unspecified: Secondary | ICD-10-CM | POA: Diagnosis not present

## 2012-01-18 DIAGNOSIS — D539 Nutritional anemia, unspecified: Secondary | ICD-10-CM | POA: Diagnosis not present

## 2012-01-18 DIAGNOSIS — N2581 Secondary hyperparathyroidism of renal origin: Secondary | ICD-10-CM | POA: Diagnosis not present

## 2012-01-18 DIAGNOSIS — N186 End stage renal disease: Secondary | ICD-10-CM | POA: Diagnosis not present

## 2012-01-20 DIAGNOSIS — N186 End stage renal disease: Secondary | ICD-10-CM | POA: Diagnosis not present

## 2012-01-20 DIAGNOSIS — D539 Nutritional anemia, unspecified: Secondary | ICD-10-CM | POA: Diagnosis not present

## 2012-01-20 DIAGNOSIS — N2581 Secondary hyperparathyroidism of renal origin: Secondary | ICD-10-CM | POA: Diagnosis not present

## 2012-01-22 DIAGNOSIS — N186 End stage renal disease: Secondary | ICD-10-CM | POA: Diagnosis not present

## 2012-01-23 DIAGNOSIS — N2581 Secondary hyperparathyroidism of renal origin: Secondary | ICD-10-CM | POA: Diagnosis not present

## 2012-01-23 DIAGNOSIS — E8779 Other fluid overload: Secondary | ICD-10-CM | POA: Diagnosis not present

## 2012-01-23 DIAGNOSIS — N186 End stage renal disease: Secondary | ICD-10-CM | POA: Diagnosis not present

## 2012-01-23 DIAGNOSIS — D539 Nutritional anemia, unspecified: Secondary | ICD-10-CM | POA: Diagnosis not present

## 2012-01-23 DIAGNOSIS — D509 Iron deficiency anemia, unspecified: Secondary | ICD-10-CM | POA: Diagnosis not present

## 2012-01-25 DIAGNOSIS — N2581 Secondary hyperparathyroidism of renal origin: Secondary | ICD-10-CM | POA: Diagnosis not present

## 2012-01-25 DIAGNOSIS — D539 Nutritional anemia, unspecified: Secondary | ICD-10-CM | POA: Diagnosis not present

## 2012-01-25 DIAGNOSIS — N186 End stage renal disease: Secondary | ICD-10-CM | POA: Diagnosis not present

## 2012-01-25 DIAGNOSIS — D509 Iron deficiency anemia, unspecified: Secondary | ICD-10-CM | POA: Diagnosis not present

## 2012-01-25 DIAGNOSIS — E8779 Other fluid overload: Secondary | ICD-10-CM | POA: Diagnosis not present

## 2012-01-27 DIAGNOSIS — N2581 Secondary hyperparathyroidism of renal origin: Secondary | ICD-10-CM | POA: Diagnosis not present

## 2012-01-27 DIAGNOSIS — D509 Iron deficiency anemia, unspecified: Secondary | ICD-10-CM | POA: Diagnosis not present

## 2012-01-27 DIAGNOSIS — E8779 Other fluid overload: Secondary | ICD-10-CM | POA: Diagnosis not present

## 2012-01-27 DIAGNOSIS — D539 Nutritional anemia, unspecified: Secondary | ICD-10-CM | POA: Diagnosis not present

## 2012-01-27 DIAGNOSIS — N186 End stage renal disease: Secondary | ICD-10-CM | POA: Diagnosis not present

## 2012-01-30 DIAGNOSIS — E8779 Other fluid overload: Secondary | ICD-10-CM | POA: Diagnosis not present

## 2012-01-30 DIAGNOSIS — D539 Nutritional anemia, unspecified: Secondary | ICD-10-CM | POA: Diagnosis not present

## 2012-01-30 DIAGNOSIS — N186 End stage renal disease: Secondary | ICD-10-CM | POA: Diagnosis not present

## 2012-01-30 DIAGNOSIS — N2581 Secondary hyperparathyroidism of renal origin: Secondary | ICD-10-CM | POA: Diagnosis not present

## 2012-01-30 DIAGNOSIS — D509 Iron deficiency anemia, unspecified: Secondary | ICD-10-CM | POA: Diagnosis not present

## 2012-02-01 DIAGNOSIS — N186 End stage renal disease: Secondary | ICD-10-CM | POA: Diagnosis not present

## 2012-02-01 DIAGNOSIS — N2581 Secondary hyperparathyroidism of renal origin: Secondary | ICD-10-CM | POA: Diagnosis not present

## 2012-02-01 DIAGNOSIS — D539 Nutritional anemia, unspecified: Secondary | ICD-10-CM | POA: Diagnosis not present

## 2012-02-01 DIAGNOSIS — E8779 Other fluid overload: Secondary | ICD-10-CM | POA: Diagnosis not present

## 2012-02-01 DIAGNOSIS — D509 Iron deficiency anemia, unspecified: Secondary | ICD-10-CM | POA: Diagnosis not present

## 2012-02-03 DIAGNOSIS — E8779 Other fluid overload: Secondary | ICD-10-CM | POA: Diagnosis not present

## 2012-02-03 DIAGNOSIS — N186 End stage renal disease: Secondary | ICD-10-CM | POA: Diagnosis not present

## 2012-02-03 DIAGNOSIS — N2581 Secondary hyperparathyroidism of renal origin: Secondary | ICD-10-CM | POA: Diagnosis not present

## 2012-02-03 DIAGNOSIS — D509 Iron deficiency anemia, unspecified: Secondary | ICD-10-CM | POA: Diagnosis not present

## 2012-02-03 DIAGNOSIS — D539 Nutritional anemia, unspecified: Secondary | ICD-10-CM | POA: Diagnosis not present

## 2012-02-06 DIAGNOSIS — N2581 Secondary hyperparathyroidism of renal origin: Secondary | ICD-10-CM | POA: Diagnosis not present

## 2012-02-06 DIAGNOSIS — N186 End stage renal disease: Secondary | ICD-10-CM | POA: Diagnosis not present

## 2012-02-06 DIAGNOSIS — D539 Nutritional anemia, unspecified: Secondary | ICD-10-CM | POA: Diagnosis not present

## 2012-02-06 DIAGNOSIS — D509 Iron deficiency anemia, unspecified: Secondary | ICD-10-CM | POA: Diagnosis not present

## 2012-02-06 DIAGNOSIS — E8779 Other fluid overload: Secondary | ICD-10-CM | POA: Diagnosis not present

## 2012-02-08 DIAGNOSIS — D539 Nutritional anemia, unspecified: Secondary | ICD-10-CM | POA: Diagnosis not present

## 2012-02-08 DIAGNOSIS — D509 Iron deficiency anemia, unspecified: Secondary | ICD-10-CM | POA: Diagnosis not present

## 2012-02-08 DIAGNOSIS — E8779 Other fluid overload: Secondary | ICD-10-CM | POA: Diagnosis not present

## 2012-02-08 DIAGNOSIS — N186 End stage renal disease: Secondary | ICD-10-CM | POA: Diagnosis not present

## 2012-02-08 DIAGNOSIS — N2581 Secondary hyperparathyroidism of renal origin: Secondary | ICD-10-CM | POA: Diagnosis not present

## 2012-02-10 DIAGNOSIS — N2581 Secondary hyperparathyroidism of renal origin: Secondary | ICD-10-CM | POA: Diagnosis not present

## 2012-02-10 DIAGNOSIS — D509 Iron deficiency anemia, unspecified: Secondary | ICD-10-CM | POA: Diagnosis not present

## 2012-02-10 DIAGNOSIS — N186 End stage renal disease: Secondary | ICD-10-CM | POA: Diagnosis not present

## 2012-02-10 DIAGNOSIS — D539 Nutritional anemia, unspecified: Secondary | ICD-10-CM | POA: Diagnosis not present

## 2012-02-10 DIAGNOSIS — E8779 Other fluid overload: Secondary | ICD-10-CM | POA: Diagnosis not present

## 2012-02-13 DIAGNOSIS — D509 Iron deficiency anemia, unspecified: Secondary | ICD-10-CM | POA: Diagnosis not present

## 2012-02-13 DIAGNOSIS — E8779 Other fluid overload: Secondary | ICD-10-CM | POA: Diagnosis not present

## 2012-02-13 DIAGNOSIS — N186 End stage renal disease: Secondary | ICD-10-CM | POA: Diagnosis not present

## 2012-02-13 DIAGNOSIS — N2581 Secondary hyperparathyroidism of renal origin: Secondary | ICD-10-CM | POA: Diagnosis not present

## 2012-02-13 DIAGNOSIS — D539 Nutritional anemia, unspecified: Secondary | ICD-10-CM | POA: Diagnosis not present

## 2012-02-14 DIAGNOSIS — E8779 Other fluid overload: Secondary | ICD-10-CM | POA: Diagnosis not present

## 2012-02-14 DIAGNOSIS — D509 Iron deficiency anemia, unspecified: Secondary | ICD-10-CM | POA: Diagnosis not present

## 2012-02-14 DIAGNOSIS — D539 Nutritional anemia, unspecified: Secondary | ICD-10-CM | POA: Diagnosis not present

## 2012-02-14 DIAGNOSIS — N2581 Secondary hyperparathyroidism of renal origin: Secondary | ICD-10-CM | POA: Diagnosis not present

## 2012-02-14 DIAGNOSIS — N186 End stage renal disease: Secondary | ICD-10-CM | POA: Diagnosis not present

## 2012-02-15 DIAGNOSIS — D509 Iron deficiency anemia, unspecified: Secondary | ICD-10-CM | POA: Diagnosis not present

## 2012-02-15 DIAGNOSIS — E8779 Other fluid overload: Secondary | ICD-10-CM | POA: Diagnosis not present

## 2012-02-15 DIAGNOSIS — N186 End stage renal disease: Secondary | ICD-10-CM | POA: Diagnosis not present

## 2012-02-15 DIAGNOSIS — N2581 Secondary hyperparathyroidism of renal origin: Secondary | ICD-10-CM | POA: Diagnosis not present

## 2012-02-15 DIAGNOSIS — D539 Nutritional anemia, unspecified: Secondary | ICD-10-CM | POA: Diagnosis not present

## 2012-02-17 DIAGNOSIS — D539 Nutritional anemia, unspecified: Secondary | ICD-10-CM | POA: Diagnosis not present

## 2012-02-17 DIAGNOSIS — E8779 Other fluid overload: Secondary | ICD-10-CM | POA: Diagnosis not present

## 2012-02-17 DIAGNOSIS — N2581 Secondary hyperparathyroidism of renal origin: Secondary | ICD-10-CM | POA: Diagnosis not present

## 2012-02-17 DIAGNOSIS — D509 Iron deficiency anemia, unspecified: Secondary | ICD-10-CM | POA: Diagnosis not present

## 2012-02-17 DIAGNOSIS — N186 End stage renal disease: Secondary | ICD-10-CM | POA: Diagnosis not present

## 2012-02-20 DIAGNOSIS — D539 Nutritional anemia, unspecified: Secondary | ICD-10-CM | POA: Diagnosis not present

## 2012-02-20 DIAGNOSIS — D509 Iron deficiency anemia, unspecified: Secondary | ICD-10-CM | POA: Diagnosis not present

## 2012-02-20 DIAGNOSIS — E8779 Other fluid overload: Secondary | ICD-10-CM | POA: Diagnosis not present

## 2012-02-20 DIAGNOSIS — N2581 Secondary hyperparathyroidism of renal origin: Secondary | ICD-10-CM | POA: Diagnosis not present

## 2012-02-20 DIAGNOSIS — N186 End stage renal disease: Secondary | ICD-10-CM | POA: Diagnosis not present

## 2012-02-21 DIAGNOSIS — N186 End stage renal disease: Secondary | ICD-10-CM | POA: Diagnosis not present

## 2012-02-22 DIAGNOSIS — N186 End stage renal disease: Secondary | ICD-10-CM | POA: Diagnosis not present

## 2012-02-22 DIAGNOSIS — N2581 Secondary hyperparathyroidism of renal origin: Secondary | ICD-10-CM | POA: Diagnosis not present

## 2012-02-22 DIAGNOSIS — D539 Nutritional anemia, unspecified: Secondary | ICD-10-CM | POA: Diagnosis not present

## 2012-03-23 DIAGNOSIS — N186 End stage renal disease: Secondary | ICD-10-CM | POA: Diagnosis not present

## 2012-03-26 DIAGNOSIS — N186 End stage renal disease: Secondary | ICD-10-CM | POA: Diagnosis not present

## 2012-03-26 DIAGNOSIS — N2581 Secondary hyperparathyroidism of renal origin: Secondary | ICD-10-CM | POA: Diagnosis not present

## 2012-03-26 DIAGNOSIS — D539 Nutritional anemia, unspecified: Secondary | ICD-10-CM | POA: Diagnosis not present

## 2012-03-28 DIAGNOSIS — D539 Nutritional anemia, unspecified: Secondary | ICD-10-CM | POA: Diagnosis not present

## 2012-03-28 DIAGNOSIS — N2581 Secondary hyperparathyroidism of renal origin: Secondary | ICD-10-CM | POA: Diagnosis not present

## 2012-03-28 DIAGNOSIS — N186 End stage renal disease: Secondary | ICD-10-CM | POA: Diagnosis not present

## 2012-03-30 DIAGNOSIS — N186 End stage renal disease: Secondary | ICD-10-CM | POA: Diagnosis not present

## 2012-03-30 DIAGNOSIS — D539 Nutritional anemia, unspecified: Secondary | ICD-10-CM | POA: Diagnosis not present

## 2012-03-30 DIAGNOSIS — N2581 Secondary hyperparathyroidism of renal origin: Secondary | ICD-10-CM | POA: Diagnosis not present

## 2012-04-02 DIAGNOSIS — N186 End stage renal disease: Secondary | ICD-10-CM | POA: Diagnosis not present

## 2012-04-02 DIAGNOSIS — D539 Nutritional anemia, unspecified: Secondary | ICD-10-CM | POA: Diagnosis not present

## 2012-04-02 DIAGNOSIS — N2581 Secondary hyperparathyroidism of renal origin: Secondary | ICD-10-CM | POA: Diagnosis not present

## 2012-04-04 ENCOUNTER — Other Ambulatory Visit: Payer: Self-pay

## 2012-04-04 DIAGNOSIS — N186 End stage renal disease: Secondary | ICD-10-CM | POA: Diagnosis not present

## 2012-04-04 DIAGNOSIS — N2581 Secondary hyperparathyroidism of renal origin: Secondary | ICD-10-CM | POA: Diagnosis not present

## 2012-04-04 DIAGNOSIS — T82598A Other mechanical complication of other cardiac and vascular devices and implants, initial encounter: Secondary | ICD-10-CM

## 2012-04-04 DIAGNOSIS — D539 Nutritional anemia, unspecified: Secondary | ICD-10-CM | POA: Diagnosis not present

## 2012-04-06 DIAGNOSIS — N186 End stage renal disease: Secondary | ICD-10-CM | POA: Diagnosis not present

## 2012-04-06 DIAGNOSIS — D539 Nutritional anemia, unspecified: Secondary | ICD-10-CM | POA: Diagnosis not present

## 2012-04-06 DIAGNOSIS — N2581 Secondary hyperparathyroidism of renal origin: Secondary | ICD-10-CM | POA: Diagnosis not present

## 2012-04-09 ENCOUNTER — Encounter: Payer: Self-pay | Admitting: Vascular Surgery

## 2012-04-09 DIAGNOSIS — N186 End stage renal disease: Secondary | ICD-10-CM | POA: Diagnosis not present

## 2012-04-09 DIAGNOSIS — N2581 Secondary hyperparathyroidism of renal origin: Secondary | ICD-10-CM | POA: Diagnosis not present

## 2012-04-09 DIAGNOSIS — D539 Nutritional anemia, unspecified: Secondary | ICD-10-CM | POA: Diagnosis not present

## 2012-04-11 DIAGNOSIS — D539 Nutritional anemia, unspecified: Secondary | ICD-10-CM | POA: Diagnosis not present

## 2012-04-11 DIAGNOSIS — N186 End stage renal disease: Secondary | ICD-10-CM | POA: Diagnosis not present

## 2012-04-11 DIAGNOSIS — N2581 Secondary hyperparathyroidism of renal origin: Secondary | ICD-10-CM | POA: Diagnosis not present

## 2012-04-13 DIAGNOSIS — D539 Nutritional anemia, unspecified: Secondary | ICD-10-CM | POA: Diagnosis not present

## 2012-04-13 DIAGNOSIS — N2581 Secondary hyperparathyroidism of renal origin: Secondary | ICD-10-CM | POA: Diagnosis not present

## 2012-04-13 DIAGNOSIS — N186 End stage renal disease: Secondary | ICD-10-CM | POA: Diagnosis not present

## 2012-04-16 DIAGNOSIS — D539 Nutritional anemia, unspecified: Secondary | ICD-10-CM | POA: Diagnosis not present

## 2012-04-16 DIAGNOSIS — N2581 Secondary hyperparathyroidism of renal origin: Secondary | ICD-10-CM | POA: Diagnosis not present

## 2012-04-16 DIAGNOSIS — N186 End stage renal disease: Secondary | ICD-10-CM | POA: Diagnosis not present

## 2012-04-17 ENCOUNTER — Ambulatory Visit: Payer: Self-pay | Admitting: Vascular Surgery

## 2012-04-18 DIAGNOSIS — N2581 Secondary hyperparathyroidism of renal origin: Secondary | ICD-10-CM | POA: Diagnosis not present

## 2012-04-18 DIAGNOSIS — D539 Nutritional anemia, unspecified: Secondary | ICD-10-CM | POA: Diagnosis not present

## 2012-04-18 DIAGNOSIS — N186 End stage renal disease: Secondary | ICD-10-CM | POA: Diagnosis not present

## 2012-04-20 DIAGNOSIS — D539 Nutritional anemia, unspecified: Secondary | ICD-10-CM | POA: Diagnosis not present

## 2012-04-20 DIAGNOSIS — N2581 Secondary hyperparathyroidism of renal origin: Secondary | ICD-10-CM | POA: Diagnosis not present

## 2012-04-20 DIAGNOSIS — N186 End stage renal disease: Secondary | ICD-10-CM | POA: Diagnosis not present

## 2012-04-22 DIAGNOSIS — N186 End stage renal disease: Secondary | ICD-10-CM | POA: Diagnosis not present

## 2012-04-23 DIAGNOSIS — D509 Iron deficiency anemia, unspecified: Secondary | ICD-10-CM | POA: Diagnosis not present

## 2012-04-23 DIAGNOSIS — D539 Nutritional anemia, unspecified: Secondary | ICD-10-CM | POA: Diagnosis not present

## 2012-04-23 DIAGNOSIS — N186 End stage renal disease: Secondary | ICD-10-CM | POA: Diagnosis not present

## 2012-04-23 DIAGNOSIS — N2581 Secondary hyperparathyroidism of renal origin: Secondary | ICD-10-CM | POA: Diagnosis not present

## 2012-05-02 ENCOUNTER — Encounter: Payer: Self-pay | Admitting: Vascular Surgery

## 2012-05-03 ENCOUNTER — Encounter (INDEPENDENT_AMBULATORY_CARE_PROVIDER_SITE_OTHER): Payer: Medicare Other | Admitting: *Deleted

## 2012-05-03 ENCOUNTER — Ambulatory Visit (INDEPENDENT_AMBULATORY_CARE_PROVIDER_SITE_OTHER): Payer: Medicare Other | Admitting: Vascular Surgery

## 2012-05-03 ENCOUNTER — Encounter: Payer: Self-pay | Admitting: Vascular Surgery

## 2012-05-03 VITALS — BP 109/61 | HR 111 | Temp 98.4°F | Ht 71.5 in | Wt 318.0 lb

## 2012-05-03 DIAGNOSIS — T82598A Other mechanical complication of other cardiac and vascular devices and implants, initial encounter: Secondary | ICD-10-CM

## 2012-05-03 DIAGNOSIS — N186 End stage renal disease: Secondary | ICD-10-CM

## 2012-05-03 DIAGNOSIS — T82898A Other specified complication of vascular prosthetic devices, implants and grafts, initial encounter: Secondary | ICD-10-CM

## 2012-05-03 NOTE — Progress Notes (Signed)
Patient is a 41 year old male referred by Dr. 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 The fistula was originally placed in 2005. He was last seen in September of 2011 [facility] [address]. early. At that time was described he had 3 areas of aneurysmal degeneration in the upper arm fistula. He has had no prior episodes of bleeding. He occasionally does have a small scab that forms on the aneurysm and he tries to avoid picking at these. He states that he thinks aneurysms at about the same as they did 2 years ago.  Review of systems: Pulmonary he denies shortness of breath Cardiac he denies chest pain  Physical exam: Filed Vitals:   05/03/12 1108  BP: 109/61  Pulse: 111  Temp: 98.4 F (36.9 C)  TempSrc: Oral  Height: 5' 11.5" (1.816 m)  Weight: 318 lb (144.244 kg)  SpO2: 90%   Right upper extremity: 3 discrete aneurysms of the right upper arm AV fistula. These are all approximately 4-4/2 cm in diameter. There is some pink scan that it does not appear to be significantly thinned out. I can pinch the skin up and away from the fistula. There are no real significant ulcerations.  Chest: Clear to auscultation palpable fistula in the right anterior chest wall but no collateralization  Cardiac: Regular rate and rhythm  Data: Patient had a duplex of his AV fistula today. This shows the fistula diameter is between 2 and 3 a half centimeters throughout its course. There is no obvious narrowing. I reviewed and interpreted this study.  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 This does not really appear to have deteriorated any since seen by my partner Dr. Donnetta Hutching in 2011.  Plan: The patient was instructed today on how to control any bleeding if he has a bleeding episode from the fistula. Think overall he is fairly low risk for this currently. If he does have any bleeding episodes we would need to consider either ligation of the fistula or plication of it. However do  not believe that is necessarily warranted currently. I scheduled him for a followup visit with me in 6 months time to recheck him.  Ruta Hinds, MD [facility] Office: [phone] Pager: [phone]

## 2012-05-11 NOTE — Procedures (Unsigned)
VASCULAR LAB EXAM  INDICATION:  Evaluation of stability of right arm fistula pseudoaneurysm.  HISTORY: Diabetes:  Yes Cardiac:  Unknown Hypertension:  Yes  EXAM:  Right brachiocephalic AV fistula duplex.  IMPRESSION: 1. Patent right arterial inflow and outflow. 2. Multiple areas of aneurysmal dilatation noted throughout the right     cephalic outflow vein. 3. Please see the following worksheet for depth, diameter, and     velocity measurements.  ___________________________________________ Jessy Oto. Fields, MD  EM/MEDQ  D:  05/04/2012  T:  05/04/2012  Job:  [id number]

## 2012-05-23 DIAGNOSIS — N186 End stage renal disease: Secondary | ICD-10-CM | POA: Diagnosis not present

## 2012-05-25 DIAGNOSIS — D539 Nutritional anemia, unspecified: Secondary | ICD-10-CM | POA: Diagnosis not present

## 2012-05-25 DIAGNOSIS — N2581 Secondary hyperparathyroidism of renal origin: Secondary | ICD-10-CM | POA: Diagnosis not present

## 2012-05-25 DIAGNOSIS — D509 Iron deficiency anemia, unspecified: Secondary | ICD-10-CM | POA: Diagnosis not present

## 2012-05-25 DIAGNOSIS — N186 End stage renal disease: Secondary | ICD-10-CM | POA: Diagnosis not present

## 2012-05-25 DIAGNOSIS — E8779 Other fluid overload: Secondary | ICD-10-CM | POA: Diagnosis not present

## 2012-05-28 DIAGNOSIS — N186 End stage renal disease: Secondary | ICD-10-CM | POA: Diagnosis not present

## 2012-05-28 DIAGNOSIS — E8779 Other fluid overload: Secondary | ICD-10-CM | POA: Diagnosis not present

## 2012-05-28 DIAGNOSIS — N2581 Secondary hyperparathyroidism of renal origin: Secondary | ICD-10-CM | POA: Diagnosis not present

## 2012-05-28 DIAGNOSIS — D539 Nutritional anemia, unspecified: Secondary | ICD-10-CM | POA: Diagnosis not present

## 2012-05-28 DIAGNOSIS — D509 Iron deficiency anemia, unspecified: Secondary | ICD-10-CM | POA: Diagnosis not present

## 2012-05-30 DIAGNOSIS — D539 Nutritional anemia, unspecified: Secondary | ICD-10-CM | POA: Diagnosis not present

## 2012-05-30 DIAGNOSIS — D509 Iron deficiency anemia, unspecified: Secondary | ICD-10-CM | POA: Diagnosis not present

## 2012-05-30 DIAGNOSIS — N2581 Secondary hyperparathyroidism of renal origin: Secondary | ICD-10-CM | POA: Diagnosis not present

## 2012-05-30 DIAGNOSIS — E8779 Other fluid overload: Secondary | ICD-10-CM | POA: Diagnosis not present

## 2012-05-30 DIAGNOSIS — N186 End stage renal disease: Secondary | ICD-10-CM | POA: Diagnosis not present

## 2012-06-01 DIAGNOSIS — D509 Iron deficiency anemia, unspecified: Secondary | ICD-10-CM | POA: Diagnosis not present

## 2012-06-01 DIAGNOSIS — E8779 Other fluid overload: Secondary | ICD-10-CM | POA: Diagnosis not present

## 2012-06-01 DIAGNOSIS — N2581 Secondary hyperparathyroidism of renal origin: Secondary | ICD-10-CM | POA: Diagnosis not present

## 2012-06-01 DIAGNOSIS — D539 Nutritional anemia, unspecified: Secondary | ICD-10-CM | POA: Diagnosis not present

## 2012-06-01 DIAGNOSIS — N186 End stage renal disease: Secondary | ICD-10-CM | POA: Diagnosis not present

## 2012-06-04 DIAGNOSIS — N2581 Secondary hyperparathyroidism of renal origin: Secondary | ICD-10-CM | POA: Diagnosis not present

## 2012-06-04 DIAGNOSIS — E8779 Other fluid overload: Secondary | ICD-10-CM | POA: Diagnosis not present

## 2012-06-04 DIAGNOSIS — D509 Iron deficiency anemia, unspecified: Secondary | ICD-10-CM | POA: Diagnosis not present

## 2012-06-04 DIAGNOSIS — D539 Nutritional anemia, unspecified: Secondary | ICD-10-CM | POA: Diagnosis not present

## 2012-06-04 DIAGNOSIS — N186 End stage renal disease: Secondary | ICD-10-CM | POA: Diagnosis not present

## 2012-06-06 DIAGNOSIS — D509 Iron deficiency anemia, unspecified: Secondary | ICD-10-CM | POA: Diagnosis not present

## 2012-06-06 DIAGNOSIS — D539 Nutritional anemia, unspecified: Secondary | ICD-10-CM | POA: Diagnosis not present

## 2012-06-06 DIAGNOSIS — N186 End stage renal disease: Secondary | ICD-10-CM | POA: Diagnosis not present

## 2012-06-06 DIAGNOSIS — E8779 Other fluid overload: Secondary | ICD-10-CM | POA: Diagnosis not present

## 2012-06-06 DIAGNOSIS — N2581 Secondary hyperparathyroidism of renal origin: Secondary | ICD-10-CM | POA: Diagnosis not present

## 2012-06-08 DIAGNOSIS — E8779 Other fluid overload: Secondary | ICD-10-CM | POA: Diagnosis not present

## 2012-06-08 DIAGNOSIS — N2581 Secondary hyperparathyroidism of renal origin: Secondary | ICD-10-CM | POA: Diagnosis not present

## 2012-06-08 DIAGNOSIS — D539 Nutritional anemia, unspecified: Secondary | ICD-10-CM | POA: Diagnosis not present

## 2012-06-08 DIAGNOSIS — N186 End stage renal disease: Secondary | ICD-10-CM | POA: Diagnosis not present

## 2012-06-08 DIAGNOSIS — D509 Iron deficiency anemia, unspecified: Secondary | ICD-10-CM | POA: Diagnosis not present

## 2012-06-11 DIAGNOSIS — D509 Iron deficiency anemia, unspecified: Secondary | ICD-10-CM | POA: Diagnosis not present

## 2012-06-11 DIAGNOSIS — D539 Nutritional anemia, unspecified: Secondary | ICD-10-CM | POA: Diagnosis not present

## 2012-06-11 DIAGNOSIS — N2581 Secondary hyperparathyroidism of renal origin: Secondary | ICD-10-CM | POA: Diagnosis not present

## 2012-06-11 DIAGNOSIS — E8779 Other fluid overload: Secondary | ICD-10-CM | POA: Diagnosis not present

## 2012-06-11 DIAGNOSIS — N186 End stage renal disease: Secondary | ICD-10-CM | POA: Diagnosis not present

## 2012-06-13 DIAGNOSIS — E8779 Other fluid overload: Secondary | ICD-10-CM | POA: Diagnosis not present

## 2012-06-13 DIAGNOSIS — D509 Iron deficiency anemia, unspecified: Secondary | ICD-10-CM | POA: Diagnosis not present

## 2012-06-13 DIAGNOSIS — N186 End stage renal disease: Secondary | ICD-10-CM | POA: Diagnosis not present

## 2012-06-13 DIAGNOSIS — D539 Nutritional anemia, unspecified: Secondary | ICD-10-CM | POA: Diagnosis not present

## 2012-06-13 DIAGNOSIS — N2581 Secondary hyperparathyroidism of renal origin: Secondary | ICD-10-CM | POA: Diagnosis not present

## 2012-06-14 DIAGNOSIS — N186 End stage renal disease: Secondary | ICD-10-CM | POA: Diagnosis not present

## 2012-06-14 DIAGNOSIS — E8779 Other fluid overload: Secondary | ICD-10-CM | POA: Diagnosis not present

## 2012-06-14 DIAGNOSIS — N2581 Secondary hyperparathyroidism of renal origin: Secondary | ICD-10-CM | POA: Diagnosis not present

## 2012-06-14 DIAGNOSIS — D509 Iron deficiency anemia, unspecified: Secondary | ICD-10-CM | POA: Diagnosis not present

## 2012-06-14 DIAGNOSIS — D539 Nutritional anemia, unspecified: Secondary | ICD-10-CM | POA: Diagnosis not present

## 2012-06-15 DIAGNOSIS — D539 Nutritional anemia, unspecified: Secondary | ICD-10-CM | POA: Diagnosis not present

## 2012-06-15 DIAGNOSIS — D509 Iron deficiency anemia, unspecified: Secondary | ICD-10-CM | POA: Diagnosis not present

## 2012-06-15 DIAGNOSIS — E8779 Other fluid overload: Secondary | ICD-10-CM | POA: Diagnosis not present

## 2012-06-15 DIAGNOSIS — N186 End stage renal disease: Secondary | ICD-10-CM | POA: Diagnosis not present

## 2012-06-15 DIAGNOSIS — N2581 Secondary hyperparathyroidism of renal origin: Secondary | ICD-10-CM | POA: Diagnosis not present

## 2012-06-18 DIAGNOSIS — N186 End stage renal disease: Secondary | ICD-10-CM | POA: Diagnosis not present

## 2012-06-18 DIAGNOSIS — N2581 Secondary hyperparathyroidism of renal origin: Secondary | ICD-10-CM | POA: Diagnosis not present

## 2012-06-18 DIAGNOSIS — D509 Iron deficiency anemia, unspecified: Secondary | ICD-10-CM | POA: Diagnosis not present

## 2012-06-18 DIAGNOSIS — E8779 Other fluid overload: Secondary | ICD-10-CM | POA: Diagnosis not present

## 2012-06-18 DIAGNOSIS — D539 Nutritional anemia, unspecified: Secondary | ICD-10-CM | POA: Diagnosis not present

## 2012-06-20 DIAGNOSIS — E8779 Other fluid overload: Secondary | ICD-10-CM | POA: Diagnosis not present

## 2012-06-20 DIAGNOSIS — N2581 Secondary hyperparathyroidism of renal origin: Secondary | ICD-10-CM | POA: Diagnosis not present

## 2012-06-20 DIAGNOSIS — D509 Iron deficiency anemia, unspecified: Secondary | ICD-10-CM | POA: Diagnosis not present

## 2012-06-20 DIAGNOSIS — D539 Nutritional anemia, unspecified: Secondary | ICD-10-CM | POA: Diagnosis not present

## 2012-06-20 DIAGNOSIS — N186 End stage renal disease: Secondary | ICD-10-CM | POA: Diagnosis not present

## 2012-06-22 DIAGNOSIS — D539 Nutritional anemia, unspecified: Secondary | ICD-10-CM | POA: Diagnosis not present

## 2012-06-22 DIAGNOSIS — N2581 Secondary hyperparathyroidism of renal origin: Secondary | ICD-10-CM | POA: Diagnosis not present

## 2012-06-22 DIAGNOSIS — N186 End stage renal disease: Secondary | ICD-10-CM | POA: Diagnosis not present

## 2012-06-22 DIAGNOSIS — E8779 Other fluid overload: Secondary | ICD-10-CM | POA: Diagnosis not present

## 2012-06-22 DIAGNOSIS — D509 Iron deficiency anemia, unspecified: Secondary | ICD-10-CM | POA: Diagnosis not present

## 2012-06-23 DIAGNOSIS — N186 End stage renal disease: Secondary | ICD-10-CM | POA: Diagnosis not present

## 2012-06-25 DIAGNOSIS — N2581 Secondary hyperparathyroidism of renal origin: Secondary | ICD-10-CM | POA: Diagnosis not present

## 2012-06-25 DIAGNOSIS — N186 End stage renal disease: Secondary | ICD-10-CM | POA: Diagnosis not present

## 2012-06-25 DIAGNOSIS — D539 Nutritional anemia, unspecified: Secondary | ICD-10-CM | POA: Diagnosis not present

## 2012-06-27 DIAGNOSIS — D539 Nutritional anemia, unspecified: Secondary | ICD-10-CM | POA: Diagnosis not present

## 2012-06-27 DIAGNOSIS — N2581 Secondary hyperparathyroidism of renal origin: Secondary | ICD-10-CM | POA: Diagnosis not present

## 2012-06-27 DIAGNOSIS — N186 End stage renal disease: Secondary | ICD-10-CM | POA: Diagnosis not present

## 2012-06-29 DIAGNOSIS — N186 End stage renal disease: Secondary | ICD-10-CM | POA: Diagnosis not present

## 2012-06-29 DIAGNOSIS — N2581 Secondary hyperparathyroidism of renal origin: Secondary | ICD-10-CM | POA: Diagnosis not present

## 2012-06-29 DIAGNOSIS — D539 Nutritional anemia, unspecified: Secondary | ICD-10-CM | POA: Diagnosis not present

## 2012-07-02 DIAGNOSIS — N186 End stage renal disease: Secondary | ICD-10-CM | POA: Diagnosis not present

## 2012-07-02 DIAGNOSIS — N2581 Secondary hyperparathyroidism of renal origin: Secondary | ICD-10-CM | POA: Diagnosis not present

## 2012-07-02 DIAGNOSIS — D539 Nutritional anemia, unspecified: Secondary | ICD-10-CM | POA: Diagnosis not present

## 2012-07-04 DIAGNOSIS — N186 End stage renal disease: Secondary | ICD-10-CM | POA: Diagnosis not present

## 2012-07-04 DIAGNOSIS — D539 Nutritional anemia, unspecified: Secondary | ICD-10-CM | POA: Diagnosis not present

## 2012-07-04 DIAGNOSIS — N2581 Secondary hyperparathyroidism of renal origin: Secondary | ICD-10-CM | POA: Diagnosis not present

## 2012-07-07 DIAGNOSIS — N186 End stage renal disease: Secondary | ICD-10-CM | POA: Diagnosis not present

## 2012-07-07 DIAGNOSIS — D539 Nutritional anemia, unspecified: Secondary | ICD-10-CM | POA: Diagnosis not present

## 2012-07-07 DIAGNOSIS — N2581 Secondary hyperparathyroidism of renal origin: Secondary | ICD-10-CM | POA: Diagnosis not present

## 2012-07-10 DIAGNOSIS — N2581 Secondary hyperparathyroidism of renal origin: Secondary | ICD-10-CM | POA: Diagnosis not present

## 2012-07-10 DIAGNOSIS — N186 End stage renal disease: Secondary | ICD-10-CM | POA: Diagnosis not present

## 2012-07-10 DIAGNOSIS — D539 Nutritional anemia, unspecified: Secondary | ICD-10-CM | POA: Diagnosis not present

## 2012-07-12 DIAGNOSIS — N2581 Secondary hyperparathyroidism of renal origin: Secondary | ICD-10-CM | POA: Diagnosis not present

## 2012-07-12 DIAGNOSIS — N186 End stage renal disease: Secondary | ICD-10-CM | POA: Diagnosis not present

## 2012-07-12 DIAGNOSIS — D539 Nutritional anemia, unspecified: Secondary | ICD-10-CM | POA: Diagnosis not present

## 2012-07-13 DIAGNOSIS — N186 End stage renal disease: Secondary | ICD-10-CM | POA: Diagnosis not present

## 2012-07-13 DIAGNOSIS — N2581 Secondary hyperparathyroidism of renal origin: Secondary | ICD-10-CM | POA: Diagnosis not present

## 2012-07-13 DIAGNOSIS — D539 Nutritional anemia, unspecified: Secondary | ICD-10-CM | POA: Diagnosis not present

## 2012-07-16 DIAGNOSIS — D539 Nutritional anemia, unspecified: Secondary | ICD-10-CM | POA: Diagnosis not present

## 2012-07-16 DIAGNOSIS — N186 End stage renal disease: Secondary | ICD-10-CM | POA: Diagnosis not present

## 2012-07-16 DIAGNOSIS — N2581 Secondary hyperparathyroidism of renal origin: Secondary | ICD-10-CM | POA: Diagnosis not present

## 2012-07-18 DIAGNOSIS — N2581 Secondary hyperparathyroidism of renal origin: Secondary | ICD-10-CM | POA: Diagnosis not present

## 2012-07-18 DIAGNOSIS — N186 End stage renal disease: Secondary | ICD-10-CM | POA: Diagnosis not present

## 2012-07-18 DIAGNOSIS — D539 Nutritional anemia, unspecified: Secondary | ICD-10-CM | POA: Diagnosis not present

## 2012-07-20 DIAGNOSIS — N2581 Secondary hyperparathyroidism of renal origin: Secondary | ICD-10-CM | POA: Diagnosis not present

## 2012-07-20 DIAGNOSIS — N186 End stage renal disease: Secondary | ICD-10-CM | POA: Diagnosis not present

## 2012-07-20 DIAGNOSIS — D539 Nutritional anemia, unspecified: Secondary | ICD-10-CM | POA: Diagnosis not present

## 2012-07-23 DIAGNOSIS — D539 Nutritional anemia, unspecified: Secondary | ICD-10-CM | POA: Diagnosis not present

## 2012-07-23 DIAGNOSIS — N2581 Secondary hyperparathyroidism of renal origin: Secondary | ICD-10-CM | POA: Diagnosis not present

## 2012-07-23 DIAGNOSIS — N186 End stage renal disease: Secondary | ICD-10-CM | POA: Diagnosis not present

## 2012-07-25 DIAGNOSIS — N2581 Secondary hyperparathyroidism of renal origin: Secondary | ICD-10-CM | POA: Diagnosis not present

## 2012-07-25 DIAGNOSIS — Z23 Encounter for immunization: Secondary | ICD-10-CM | POA: Diagnosis not present

## 2012-07-25 DIAGNOSIS — N186 End stage renal disease: Secondary | ICD-10-CM | POA: Diagnosis not present

## 2012-07-25 DIAGNOSIS — D539 Nutritional anemia, unspecified: Secondary | ICD-10-CM | POA: Diagnosis not present

## 2012-07-25 DIAGNOSIS — D509 Iron deficiency anemia, unspecified: Secondary | ICD-10-CM | POA: Diagnosis not present

## 2012-07-27 DIAGNOSIS — D509 Iron deficiency anemia, unspecified: Secondary | ICD-10-CM | POA: Diagnosis not present

## 2012-07-27 DIAGNOSIS — N2581 Secondary hyperparathyroidism of renal origin: Secondary | ICD-10-CM | POA: Diagnosis not present

## 2012-07-27 DIAGNOSIS — D539 Nutritional anemia, unspecified: Secondary | ICD-10-CM | POA: Diagnosis not present

## 2012-07-27 DIAGNOSIS — N186 End stage renal disease: Secondary | ICD-10-CM | POA: Diagnosis not present

## 2012-07-27 DIAGNOSIS — Z23 Encounter for immunization: Secondary | ICD-10-CM | POA: Diagnosis not present

## 2012-07-30 DIAGNOSIS — D509 Iron deficiency anemia, unspecified: Secondary | ICD-10-CM | POA: Diagnosis not present

## 2012-07-30 DIAGNOSIS — D539 Nutritional anemia, unspecified: Secondary | ICD-10-CM | POA: Diagnosis not present

## 2012-07-30 DIAGNOSIS — N186 End stage renal disease: Secondary | ICD-10-CM | POA: Diagnosis not present

## 2012-07-30 DIAGNOSIS — Z23 Encounter for immunization: Secondary | ICD-10-CM | POA: Diagnosis not present

## 2012-07-30 DIAGNOSIS — N2581 Secondary hyperparathyroidism of renal origin: Secondary | ICD-10-CM | POA: Diagnosis not present

## 2012-07-31 DIAGNOSIS — N2581 Secondary hyperparathyroidism of renal origin: Secondary | ICD-10-CM | POA: Diagnosis not present

## 2012-07-31 DIAGNOSIS — D509 Iron deficiency anemia, unspecified: Secondary | ICD-10-CM | POA: Diagnosis not present

## 2012-07-31 DIAGNOSIS — D539 Nutritional anemia, unspecified: Secondary | ICD-10-CM | POA: Diagnosis not present

## 2012-07-31 DIAGNOSIS — N186 End stage renal disease: Secondary | ICD-10-CM | POA: Diagnosis not present

## 2012-07-31 DIAGNOSIS — Z23 Encounter for immunization: Secondary | ICD-10-CM | POA: Diagnosis not present

## 2012-08-01 DIAGNOSIS — D539 Nutritional anemia, unspecified: Secondary | ICD-10-CM | POA: Diagnosis not present

## 2012-08-01 DIAGNOSIS — N186 End stage renal disease: Secondary | ICD-10-CM | POA: Diagnosis not present

## 2012-08-01 DIAGNOSIS — N2581 Secondary hyperparathyroidism of renal origin: Secondary | ICD-10-CM | POA: Diagnosis not present

## 2012-08-01 DIAGNOSIS — Z23 Encounter for immunization: Secondary | ICD-10-CM | POA: Diagnosis not present

## 2012-08-01 DIAGNOSIS — D509 Iron deficiency anemia, unspecified: Secondary | ICD-10-CM | POA: Diagnosis not present

## 2012-08-03 DIAGNOSIS — N186 End stage renal disease: Secondary | ICD-10-CM | POA: Diagnosis not present

## 2012-08-03 DIAGNOSIS — N2581 Secondary hyperparathyroidism of renal origin: Secondary | ICD-10-CM | POA: Diagnosis not present

## 2012-08-03 DIAGNOSIS — D509 Iron deficiency anemia, unspecified: Secondary | ICD-10-CM | POA: Diagnosis not present

## 2012-08-03 DIAGNOSIS — Z23 Encounter for immunization: Secondary | ICD-10-CM | POA: Diagnosis not present

## 2012-08-03 DIAGNOSIS — D539 Nutritional anemia, unspecified: Secondary | ICD-10-CM | POA: Diagnosis not present

## 2012-08-06 DIAGNOSIS — Z23 Encounter for immunization: Secondary | ICD-10-CM | POA: Diagnosis not present

## 2012-08-06 DIAGNOSIS — D539 Nutritional anemia, unspecified: Secondary | ICD-10-CM | POA: Diagnosis not present

## 2012-08-06 DIAGNOSIS — D509 Iron deficiency anemia, unspecified: Secondary | ICD-10-CM | POA: Diagnosis not present

## 2012-08-06 DIAGNOSIS — N2581 Secondary hyperparathyroidism of renal origin: Secondary | ICD-10-CM | POA: Diagnosis not present

## 2012-08-06 DIAGNOSIS — N186 End stage renal disease: Secondary | ICD-10-CM | POA: Diagnosis not present

## 2012-08-08 DIAGNOSIS — N2581 Secondary hyperparathyroidism of renal origin: Secondary | ICD-10-CM | POA: Diagnosis not present

## 2012-08-08 DIAGNOSIS — D509 Iron deficiency anemia, unspecified: Secondary | ICD-10-CM | POA: Diagnosis not present

## 2012-08-08 DIAGNOSIS — N186 End stage renal disease: Secondary | ICD-10-CM | POA: Diagnosis not present

## 2012-08-08 DIAGNOSIS — D539 Nutritional anemia, unspecified: Secondary | ICD-10-CM | POA: Diagnosis not present

## 2012-08-08 DIAGNOSIS — Z23 Encounter for immunization: Secondary | ICD-10-CM | POA: Diagnosis not present

## 2012-08-10 DIAGNOSIS — N2581 Secondary hyperparathyroidism of renal origin: Secondary | ICD-10-CM | POA: Diagnosis not present

## 2012-08-10 DIAGNOSIS — N186 End stage renal disease: Secondary | ICD-10-CM | POA: Diagnosis not present

## 2012-08-10 DIAGNOSIS — Z23 Encounter for immunization: Secondary | ICD-10-CM | POA: Diagnosis not present

## 2012-08-10 DIAGNOSIS — D509 Iron deficiency anemia, unspecified: Secondary | ICD-10-CM | POA: Diagnosis not present

## 2012-08-10 DIAGNOSIS — D539 Nutritional anemia, unspecified: Secondary | ICD-10-CM | POA: Diagnosis not present

## 2012-08-13 DIAGNOSIS — Z23 Encounter for immunization: Secondary | ICD-10-CM | POA: Diagnosis not present

## 2012-08-13 DIAGNOSIS — D509 Iron deficiency anemia, unspecified: Secondary | ICD-10-CM | POA: Diagnosis not present

## 2012-08-13 DIAGNOSIS — D539 Nutritional anemia, unspecified: Secondary | ICD-10-CM | POA: Diagnosis not present

## 2012-08-13 DIAGNOSIS — N186 End stage renal disease: Secondary | ICD-10-CM | POA: Diagnosis not present

## 2012-08-13 DIAGNOSIS — N2581 Secondary hyperparathyroidism of renal origin: Secondary | ICD-10-CM | POA: Diagnosis not present

## 2012-08-15 DIAGNOSIS — D539 Nutritional anemia, unspecified: Secondary | ICD-10-CM | POA: Diagnosis not present

## 2012-08-15 DIAGNOSIS — N186 End stage renal disease: Secondary | ICD-10-CM | POA: Diagnosis not present

## 2012-08-15 DIAGNOSIS — Z23 Encounter for immunization: Secondary | ICD-10-CM | POA: Diagnosis not present

## 2012-08-15 DIAGNOSIS — D509 Iron deficiency anemia, unspecified: Secondary | ICD-10-CM | POA: Diagnosis not present

## 2012-08-15 DIAGNOSIS — N2581 Secondary hyperparathyroidism of renal origin: Secondary | ICD-10-CM | POA: Diagnosis not present

## 2012-08-18 DIAGNOSIS — N186 End stage renal disease: Secondary | ICD-10-CM | POA: Diagnosis not present

## 2012-08-18 DIAGNOSIS — D509 Iron deficiency anemia, unspecified: Secondary | ICD-10-CM | POA: Diagnosis not present

## 2012-08-18 DIAGNOSIS — D539 Nutritional anemia, unspecified: Secondary | ICD-10-CM | POA: Diagnosis not present

## 2012-08-18 DIAGNOSIS — Z23 Encounter for immunization: Secondary | ICD-10-CM | POA: Diagnosis not present

## 2012-08-18 DIAGNOSIS — N2581 Secondary hyperparathyroidism of renal origin: Secondary | ICD-10-CM | POA: Diagnosis not present

## 2012-08-22 DIAGNOSIS — D509 Iron deficiency anemia, unspecified: Secondary | ICD-10-CM | POA: Diagnosis not present

## 2012-08-22 DIAGNOSIS — Z23 Encounter for immunization: Secondary | ICD-10-CM | POA: Diagnosis not present

## 2012-08-22 DIAGNOSIS — N186 End stage renal disease: Secondary | ICD-10-CM | POA: Diagnosis not present

## 2012-08-22 DIAGNOSIS — D539 Nutritional anemia, unspecified: Secondary | ICD-10-CM | POA: Diagnosis not present

## 2012-08-22 DIAGNOSIS — N2581 Secondary hyperparathyroidism of renal origin: Secondary | ICD-10-CM | POA: Diagnosis not present

## 2012-08-23 DIAGNOSIS — D539 Nutritional anemia, unspecified: Secondary | ICD-10-CM | POA: Diagnosis not present

## 2012-08-23 DIAGNOSIS — D509 Iron deficiency anemia, unspecified: Secondary | ICD-10-CM | POA: Diagnosis not present

## 2012-08-23 DIAGNOSIS — N186 End stage renal disease: Secondary | ICD-10-CM | POA: Diagnosis not present

## 2012-08-23 DIAGNOSIS — Z23 Encounter for immunization: Secondary | ICD-10-CM | POA: Diagnosis not present

## 2012-08-23 DIAGNOSIS — N2581 Secondary hyperparathyroidism of renal origin: Secondary | ICD-10-CM | POA: Diagnosis not present

## 2012-08-24 DIAGNOSIS — E8779 Other fluid overload: Secondary | ICD-10-CM | POA: Diagnosis not present

## 2012-08-24 DIAGNOSIS — N186 End stage renal disease: Secondary | ICD-10-CM | POA: Diagnosis not present

## 2012-08-24 DIAGNOSIS — N2581 Secondary hyperparathyroidism of renal origin: Secondary | ICD-10-CM | POA: Diagnosis not present

## 2012-08-27 DIAGNOSIS — E8779 Other fluid overload: Secondary | ICD-10-CM | POA: Diagnosis not present

## 2012-08-27 DIAGNOSIS — N2581 Secondary hyperparathyroidism of renal origin: Secondary | ICD-10-CM | POA: Diagnosis not present

## 2012-08-27 DIAGNOSIS — N186 End stage renal disease: Secondary | ICD-10-CM | POA: Diagnosis not present

## 2012-08-29 DIAGNOSIS — N2581 Secondary hyperparathyroidism of renal origin: Secondary | ICD-10-CM | POA: Diagnosis not present

## 2012-08-29 DIAGNOSIS — N186 End stage renal disease: Secondary | ICD-10-CM | POA: Diagnosis not present

## 2012-08-29 DIAGNOSIS — E8779 Other fluid overload: Secondary | ICD-10-CM | POA: Diagnosis not present

## 2012-08-31 DIAGNOSIS — N186 End stage renal disease: Secondary | ICD-10-CM | POA: Diagnosis not present

## 2012-08-31 DIAGNOSIS — E8779 Other fluid overload: Secondary | ICD-10-CM | POA: Diagnosis not present

## 2012-08-31 DIAGNOSIS — N2581 Secondary hyperparathyroidism of renal origin: Secondary | ICD-10-CM | POA: Diagnosis not present

## 2012-09-03 DIAGNOSIS — E8779 Other fluid overload: Secondary | ICD-10-CM | POA: Diagnosis not present

## 2012-09-03 DIAGNOSIS — N2581 Secondary hyperparathyroidism of renal origin: Secondary | ICD-10-CM | POA: Diagnosis not present

## 2012-09-03 DIAGNOSIS — N186 End stage renal disease: Secondary | ICD-10-CM | POA: Diagnosis not present

## 2012-09-05 DIAGNOSIS — N2581 Secondary hyperparathyroidism of renal origin: Secondary | ICD-10-CM | POA: Diagnosis not present

## 2012-09-05 DIAGNOSIS — E8779 Other fluid overload: Secondary | ICD-10-CM | POA: Diagnosis not present

## 2012-09-05 DIAGNOSIS — N186 End stage renal disease: Secondary | ICD-10-CM | POA: Diagnosis not present

## 2012-09-07 DIAGNOSIS — E8779 Other fluid overload: Secondary | ICD-10-CM | POA: Diagnosis not present

## 2012-09-07 DIAGNOSIS — N2581 Secondary hyperparathyroidism of renal origin: Secondary | ICD-10-CM | POA: Diagnosis not present

## 2012-09-07 DIAGNOSIS — N186 End stage renal disease: Secondary | ICD-10-CM | POA: Diagnosis not present

## 2012-09-10 DIAGNOSIS — N186 End stage renal disease: Secondary | ICD-10-CM | POA: Diagnosis not present

## 2012-09-10 DIAGNOSIS — E8779 Other fluid overload: Secondary | ICD-10-CM | POA: Diagnosis not present

## 2012-09-10 DIAGNOSIS — N2581 Secondary hyperparathyroidism of renal origin: Secondary | ICD-10-CM | POA: Diagnosis not present

## 2012-09-11 DIAGNOSIS — E8779 Other fluid overload: Secondary | ICD-10-CM | POA: Diagnosis not present

## 2012-09-11 DIAGNOSIS — N186 End stage renal disease: Secondary | ICD-10-CM | POA: Diagnosis not present

## 2012-09-11 DIAGNOSIS — N2581 Secondary hyperparathyroidism of renal origin: Secondary | ICD-10-CM | POA: Diagnosis not present

## 2012-09-12 DIAGNOSIS — N2581 Secondary hyperparathyroidism of renal origin: Secondary | ICD-10-CM | POA: Diagnosis not present

## 2012-09-12 DIAGNOSIS — N186 End stage renal disease: Secondary | ICD-10-CM | POA: Diagnosis not present

## 2012-09-12 DIAGNOSIS — E8779 Other fluid overload: Secondary | ICD-10-CM | POA: Diagnosis not present

## 2012-09-14 DIAGNOSIS — N2581 Secondary hyperparathyroidism of renal origin: Secondary | ICD-10-CM | POA: Diagnosis not present

## 2012-09-14 DIAGNOSIS — E8779 Other fluid overload: Secondary | ICD-10-CM | POA: Diagnosis not present

## 2012-09-14 DIAGNOSIS — N186 End stage renal disease: Secondary | ICD-10-CM | POA: Diagnosis not present

## 2012-09-17 DIAGNOSIS — N186 End stage renal disease: Secondary | ICD-10-CM | POA: Diagnosis not present

## 2012-09-17 DIAGNOSIS — N2581 Secondary hyperparathyroidism of renal origin: Secondary | ICD-10-CM | POA: Diagnosis not present

## 2012-09-17 DIAGNOSIS — E8779 Other fluid overload: Secondary | ICD-10-CM | POA: Diagnosis not present

## 2012-09-19 DIAGNOSIS — N2581 Secondary hyperparathyroidism of renal origin: Secondary | ICD-10-CM | POA: Diagnosis not present

## 2012-09-19 DIAGNOSIS — E8779 Other fluid overload: Secondary | ICD-10-CM | POA: Diagnosis not present

## 2012-09-19 DIAGNOSIS — N186 End stage renal disease: Secondary | ICD-10-CM | POA: Diagnosis not present

## 2012-09-21 DIAGNOSIS — N186 End stage renal disease: Secondary | ICD-10-CM | POA: Diagnosis not present

## 2012-09-21 DIAGNOSIS — E8779 Other fluid overload: Secondary | ICD-10-CM | POA: Diagnosis not present

## 2012-09-21 DIAGNOSIS — N2581 Secondary hyperparathyroidism of renal origin: Secondary | ICD-10-CM | POA: Diagnosis not present

## 2012-09-22 DIAGNOSIS — N186 End stage renal disease: Secondary | ICD-10-CM | POA: Diagnosis not present

## 2012-09-24 DIAGNOSIS — N2581 Secondary hyperparathyroidism of renal origin: Secondary | ICD-10-CM | POA: Diagnosis not present

## 2012-09-24 DIAGNOSIS — N186 End stage renal disease: Secondary | ICD-10-CM | POA: Diagnosis not present

## 2012-09-24 DIAGNOSIS — E8779 Other fluid overload: Secondary | ICD-10-CM | POA: Diagnosis not present

## 2012-10-23 DIAGNOSIS — N186 End stage renal disease: Secondary | ICD-10-CM | POA: Diagnosis not present

## 2012-10-24 HISTORY — PX: REMOVAL OF A DIALYSIS CATHETER: SHX6053

## 2012-10-26 DIAGNOSIS — N2581 Secondary hyperparathyroidism of renal origin: Secondary | ICD-10-CM | POA: Diagnosis not present

## 2012-10-26 DIAGNOSIS — N186 End stage renal disease: Secondary | ICD-10-CM | POA: Diagnosis not present

## 2012-11-05 ENCOUNTER — Encounter: Payer: Self-pay | Admitting: Neurosurgery

## 2012-11-06 ENCOUNTER — Ambulatory Visit (INDEPENDENT_AMBULATORY_CARE_PROVIDER_SITE_OTHER): Payer: Medicare Other | Admitting: Neurosurgery

## 2012-11-06 ENCOUNTER — Encounter: Payer: Self-pay | Admitting: Neurosurgery

## 2012-11-06 VITALS — BP 77/51 | HR 117 | Resp 16 | Ht 71.5 in | Wt 324.0 lb

## 2012-11-06 DIAGNOSIS — N186 End stage renal disease: Secondary | ICD-10-CM | POA: Diagnosis not present

## 2012-11-06 NOTE — Progress Notes (Signed)
Subjective:     Patient ID: [name], male   DOB: [birth year], 42 y.o.   MRN: [id number]  HPI: 42 year old male patient seen for evaluation pseudoaneurysms in his right AV fistula. This was created in 2005 by Dr. Kellie Simmering. The patient was last evaluated by Dr. Oneida Alar in July 2013. The patient denies any problems with his fistula, he has no pain and states there is been no change in the shape or size of the fistula since he was last evaluated. The patient is dialyzing without difficulty using this fistula.   Review of Systems: 12 point review of systems is notable for the difficulties described above otherwise unremarkable     Objective:   Physical Exam: Afebrile, vital signs are stable, there is a palpable thrill throughout the fistula there is no bleeding or drainage or redness.     Assessment:     Assessment as above    Plan:     The patient will followup in 6 months or sooner if he feels he needs to, the patient's questions were encouraged and answered, he is in agreement with this plan. Everlean Patterson ANP  Clinic M.D.: Early

## 2012-11-08 ENCOUNTER — Ambulatory Visit: Payer: Medicare Other | Admitting: Neurosurgery

## 2012-11-23 DIAGNOSIS — N186 End stage renal disease: Secondary | ICD-10-CM | POA: Diagnosis not present

## 2012-11-26 DIAGNOSIS — E8779 Other fluid overload: Secondary | ICD-10-CM | POA: Diagnosis not present

## 2012-11-26 DIAGNOSIS — N2581 Secondary hyperparathyroidism of renal origin: Secondary | ICD-10-CM | POA: Diagnosis not present

## 2012-11-26 DIAGNOSIS — N186 End stage renal disease: Secondary | ICD-10-CM | POA: Diagnosis not present

## 2012-11-26 DIAGNOSIS — T82898A Other specified complication of vascular prosthetic devices, implants and grafts, initial encounter: Secondary | ICD-10-CM | POA: Diagnosis not present

## 2012-11-27 DIAGNOSIS — N186 End stage renal disease: Secondary | ICD-10-CM | POA: Diagnosis not present

## 2012-11-27 DIAGNOSIS — T82898A Other specified complication of vascular prosthetic devices, implants and grafts, initial encounter: Secondary | ICD-10-CM | POA: Diagnosis not present

## 2012-11-27 DIAGNOSIS — N2581 Secondary hyperparathyroidism of renal origin: Secondary | ICD-10-CM | POA: Diagnosis not present

## 2012-11-27 DIAGNOSIS — E8779 Other fluid overload: Secondary | ICD-10-CM | POA: Diagnosis not present

## 2012-11-28 DIAGNOSIS — E8779 Other fluid overload: Secondary | ICD-10-CM | POA: Diagnosis not present

## 2012-11-28 DIAGNOSIS — N186 End stage renal disease: Secondary | ICD-10-CM | POA: Diagnosis not present

## 2012-11-28 DIAGNOSIS — T82898A Other specified complication of vascular prosthetic devices, implants and grafts, initial encounter: Secondary | ICD-10-CM | POA: Diagnosis not present

## 2012-11-28 DIAGNOSIS — N2581 Secondary hyperparathyroidism of renal origin: Secondary | ICD-10-CM | POA: Diagnosis not present

## 2012-12-01 DIAGNOSIS — N186 End stage renal disease: Secondary | ICD-10-CM | POA: Diagnosis not present

## 2012-12-01 DIAGNOSIS — E8779 Other fluid overload: Secondary | ICD-10-CM | POA: Diagnosis not present

## 2012-12-01 DIAGNOSIS — N2581 Secondary hyperparathyroidism of renal origin: Secondary | ICD-10-CM | POA: Diagnosis not present

## 2012-12-01 DIAGNOSIS — T82898A Other specified complication of vascular prosthetic devices, implants and grafts, initial encounter: Secondary | ICD-10-CM | POA: Diagnosis not present

## 2012-12-03 DIAGNOSIS — E8779 Other fluid overload: Secondary | ICD-10-CM | POA: Diagnosis not present

## 2012-12-03 DIAGNOSIS — T82898A Other specified complication of vascular prosthetic devices, implants and grafts, initial encounter: Secondary | ICD-10-CM | POA: Diagnosis not present

## 2012-12-03 DIAGNOSIS — N186 End stage renal disease: Secondary | ICD-10-CM | POA: Diagnosis not present

## 2012-12-03 DIAGNOSIS — N2581 Secondary hyperparathyroidism of renal origin: Secondary | ICD-10-CM | POA: Diagnosis not present

## 2012-12-04 DIAGNOSIS — N2581 Secondary hyperparathyroidism of renal origin: Secondary | ICD-10-CM | POA: Diagnosis not present

## 2012-12-04 DIAGNOSIS — T82898A Other specified complication of vascular prosthetic devices, implants and grafts, initial encounter: Secondary | ICD-10-CM | POA: Diagnosis not present

## 2012-12-04 DIAGNOSIS — E8779 Other fluid overload: Secondary | ICD-10-CM | POA: Diagnosis not present

## 2012-12-04 DIAGNOSIS — N186 End stage renal disease: Secondary | ICD-10-CM | POA: Diagnosis not present

## 2012-12-07 DIAGNOSIS — T82898A Other specified complication of vascular prosthetic devices, implants and grafts, initial encounter: Secondary | ICD-10-CM | POA: Diagnosis not present

## 2012-12-07 DIAGNOSIS — N186 End stage renal disease: Secondary | ICD-10-CM | POA: Diagnosis not present

## 2012-12-07 DIAGNOSIS — N2581 Secondary hyperparathyroidism of renal origin: Secondary | ICD-10-CM | POA: Diagnosis not present

## 2012-12-07 DIAGNOSIS — E8779 Other fluid overload: Secondary | ICD-10-CM | POA: Diagnosis not present

## 2012-12-10 DIAGNOSIS — T82898A Other specified complication of vascular prosthetic devices, implants and grafts, initial encounter: Secondary | ICD-10-CM | POA: Diagnosis not present

## 2012-12-10 DIAGNOSIS — N186 End stage renal disease: Secondary | ICD-10-CM | POA: Diagnosis not present

## 2012-12-10 DIAGNOSIS — E8779 Other fluid overload: Secondary | ICD-10-CM | POA: Diagnosis not present

## 2012-12-10 DIAGNOSIS — N2581 Secondary hyperparathyroidism of renal origin: Secondary | ICD-10-CM | POA: Diagnosis not present

## 2012-12-14 DIAGNOSIS — T82898A Other specified complication of vascular prosthetic devices, implants and grafts, initial encounter: Secondary | ICD-10-CM | POA: Diagnosis not present

## 2012-12-14 DIAGNOSIS — E8779 Other fluid overload: Secondary | ICD-10-CM | POA: Diagnosis not present

## 2012-12-17 DIAGNOSIS — N186 End stage renal disease: Secondary | ICD-10-CM | POA: Diagnosis not present

## 2012-12-19 DIAGNOSIS — T82898A Other specified complication of vascular prosthetic devices, implants and grafts, initial encounter: Secondary | ICD-10-CM | POA: Diagnosis not present

## 2012-12-19 DIAGNOSIS — N2581 Secondary hyperparathyroidism of renal origin: Secondary | ICD-10-CM | POA: Diagnosis not present

## 2012-12-19 DIAGNOSIS — N186 End stage renal disease: Secondary | ICD-10-CM | POA: Diagnosis not present

## 2012-12-24 DIAGNOSIS — N2581 Secondary hyperparathyroidism of renal origin: Secondary | ICD-10-CM | POA: Diagnosis not present

## 2012-12-24 DIAGNOSIS — N186 End stage renal disease: Secondary | ICD-10-CM | POA: Diagnosis not present

## 2013-01-21 DIAGNOSIS — N186 End stage renal disease: Secondary | ICD-10-CM | POA: Diagnosis not present

## 2013-01-23 DIAGNOSIS — N186 End stage renal disease: Secondary | ICD-10-CM | POA: Diagnosis not present

## 2013-01-23 DIAGNOSIS — E8779 Other fluid overload: Secondary | ICD-10-CM | POA: Diagnosis not present

## 2013-01-23 DIAGNOSIS — N2581 Secondary hyperparathyroidism of renal origin: Secondary | ICD-10-CM | POA: Diagnosis not present

## 2013-02-20 DIAGNOSIS — N186 End stage renal disease: Secondary | ICD-10-CM | POA: Diagnosis not present

## 2013-02-22 DIAGNOSIS — E8779 Other fluid overload: Secondary | ICD-10-CM | POA: Diagnosis not present

## 2013-02-22 DIAGNOSIS — N186 End stage renal disease: Secondary | ICD-10-CM | POA: Diagnosis not present

## 2013-02-22 DIAGNOSIS — N2581 Secondary hyperparathyroidism of renal origin: Secondary | ICD-10-CM | POA: Diagnosis not present

## 2013-03-23 DIAGNOSIS — N186 End stage renal disease: Secondary | ICD-10-CM | POA: Diagnosis not present

## 2013-03-25 DIAGNOSIS — N186 End stage renal disease: Secondary | ICD-10-CM | POA: Diagnosis not present

## 2013-03-25 DIAGNOSIS — N2581 Secondary hyperparathyroidism of renal origin: Secondary | ICD-10-CM | POA: Diagnosis not present

## 2013-03-25 DIAGNOSIS — D631 Anemia in chronic kidney disease: Secondary | ICD-10-CM | POA: Diagnosis not present

## 2013-03-25 DIAGNOSIS — T82898A Other specified complication of vascular prosthetic devices, implants and grafts, initial encounter: Secondary | ICD-10-CM | POA: Diagnosis not present

## 2013-03-27 DIAGNOSIS — T82898A Other specified complication of vascular prosthetic devices, implants and grafts, initial encounter: Secondary | ICD-10-CM | POA: Diagnosis not present

## 2013-03-27 DIAGNOSIS — N2581 Secondary hyperparathyroidism of renal origin: Secondary | ICD-10-CM | POA: Diagnosis not present

## 2013-03-27 DIAGNOSIS — N186 End stage renal disease: Secondary | ICD-10-CM | POA: Diagnosis not present

## 2013-03-27 DIAGNOSIS — D631 Anemia in chronic kidney disease: Secondary | ICD-10-CM | POA: Diagnosis not present

## 2013-03-29 DIAGNOSIS — N2581 Secondary hyperparathyroidism of renal origin: Secondary | ICD-10-CM | POA: Diagnosis not present

## 2013-03-29 DIAGNOSIS — T82898A Other specified complication of vascular prosthetic devices, implants and grafts, initial encounter: Secondary | ICD-10-CM | POA: Diagnosis not present

## 2013-03-29 DIAGNOSIS — N186 End stage renal disease: Secondary | ICD-10-CM | POA: Diagnosis not present

## 2013-03-29 DIAGNOSIS — N039 Chronic nephritic syndrome with unspecified morphologic changes: Secondary | ICD-10-CM | POA: Diagnosis not present

## 2013-04-01 DIAGNOSIS — N186 End stage renal disease: Secondary | ICD-10-CM | POA: Diagnosis not present

## 2013-04-01 DIAGNOSIS — N2581 Secondary hyperparathyroidism of renal origin: Secondary | ICD-10-CM | POA: Diagnosis not present

## 2013-04-01 DIAGNOSIS — T82898A Other specified complication of vascular prosthetic devices, implants and grafts, initial encounter: Secondary | ICD-10-CM | POA: Diagnosis not present

## 2013-04-01 DIAGNOSIS — D631 Anemia in chronic kidney disease: Secondary | ICD-10-CM | POA: Diagnosis not present

## 2013-04-03 DIAGNOSIS — N186 End stage renal disease: Secondary | ICD-10-CM | POA: Diagnosis not present

## 2013-04-03 DIAGNOSIS — N039 Chronic nephritic syndrome with unspecified morphologic changes: Secondary | ICD-10-CM | POA: Diagnosis not present

## 2013-04-03 DIAGNOSIS — T82898A Other specified complication of vascular prosthetic devices, implants and grafts, initial encounter: Secondary | ICD-10-CM | POA: Diagnosis not present

## 2013-04-03 DIAGNOSIS — N2581 Secondary hyperparathyroidism of renal origin: Secondary | ICD-10-CM | POA: Diagnosis not present

## 2013-04-05 DIAGNOSIS — T82898A Other specified complication of vascular prosthetic devices, implants and grafts, initial encounter: Secondary | ICD-10-CM | POA: Diagnosis not present

## 2013-04-05 DIAGNOSIS — N039 Chronic nephritic syndrome with unspecified morphologic changes: Secondary | ICD-10-CM | POA: Diagnosis not present

## 2013-04-05 DIAGNOSIS — N186 End stage renal disease: Secondary | ICD-10-CM | POA: Diagnosis not present

## 2013-04-05 DIAGNOSIS — N2581 Secondary hyperparathyroidism of renal origin: Secondary | ICD-10-CM | POA: Diagnosis not present

## 2013-04-08 DIAGNOSIS — N186 End stage renal disease: Secondary | ICD-10-CM | POA: Diagnosis not present

## 2013-04-08 DIAGNOSIS — N039 Chronic nephritic syndrome with unspecified morphologic changes: Secondary | ICD-10-CM | POA: Diagnosis not present

## 2013-04-08 DIAGNOSIS — N2581 Secondary hyperparathyroidism of renal origin: Secondary | ICD-10-CM | POA: Diagnosis not present

## 2013-04-08 DIAGNOSIS — D631 Anemia in chronic kidney disease: Secondary | ICD-10-CM | POA: Diagnosis not present

## 2013-04-08 DIAGNOSIS — T82898A Other specified complication of vascular prosthetic devices, implants and grafts, initial encounter: Secondary | ICD-10-CM | POA: Diagnosis not present

## 2013-04-10 DIAGNOSIS — T82898A Other specified complication of vascular prosthetic devices, implants and grafts, initial encounter: Secondary | ICD-10-CM | POA: Diagnosis not present

## 2013-04-10 DIAGNOSIS — N2581 Secondary hyperparathyroidism of renal origin: Secondary | ICD-10-CM | POA: Diagnosis not present

## 2013-04-10 DIAGNOSIS — N186 End stage renal disease: Secondary | ICD-10-CM | POA: Diagnosis not present

## 2013-04-10 DIAGNOSIS — D631 Anemia in chronic kidney disease: Secondary | ICD-10-CM | POA: Diagnosis not present

## 2013-04-12 DIAGNOSIS — D631 Anemia in chronic kidney disease: Secondary | ICD-10-CM | POA: Diagnosis not present

## 2013-04-12 DIAGNOSIS — T82898A Other specified complication of vascular prosthetic devices, implants and grafts, initial encounter: Secondary | ICD-10-CM | POA: Diagnosis not present

## 2013-04-12 DIAGNOSIS — N186 End stage renal disease: Secondary | ICD-10-CM | POA: Diagnosis not present

## 2013-04-12 DIAGNOSIS — N2581 Secondary hyperparathyroidism of renal origin: Secondary | ICD-10-CM | POA: Diagnosis not present

## 2013-04-15 DIAGNOSIS — D631 Anemia in chronic kidney disease: Secondary | ICD-10-CM | POA: Diagnosis not present

## 2013-04-15 DIAGNOSIS — T82898A Other specified complication of vascular prosthetic devices, implants and grafts, initial encounter: Secondary | ICD-10-CM | POA: Diagnosis not present

## 2013-04-15 DIAGNOSIS — N039 Chronic nephritic syndrome with unspecified morphologic changes: Secondary | ICD-10-CM | POA: Diagnosis not present

## 2013-04-15 DIAGNOSIS — N186 End stage renal disease: Secondary | ICD-10-CM | POA: Diagnosis not present

## 2013-04-15 DIAGNOSIS — N2581 Secondary hyperparathyroidism of renal origin: Secondary | ICD-10-CM | POA: Diagnosis not present

## 2013-04-17 DIAGNOSIS — N2581 Secondary hyperparathyroidism of renal origin: Secondary | ICD-10-CM | POA: Diagnosis not present

## 2013-04-17 DIAGNOSIS — T82898A Other specified complication of vascular prosthetic devices, implants and grafts, initial encounter: Secondary | ICD-10-CM | POA: Diagnosis not present

## 2013-04-17 DIAGNOSIS — D631 Anemia in chronic kidney disease: Secondary | ICD-10-CM | POA: Diagnosis not present

## 2013-04-17 DIAGNOSIS — N186 End stage renal disease: Secondary | ICD-10-CM | POA: Diagnosis not present

## 2013-04-18 DIAGNOSIS — N186 End stage renal disease: Secondary | ICD-10-CM | POA: Diagnosis not present

## 2013-04-18 DIAGNOSIS — I871 Compression of vein: Secondary | ICD-10-CM | POA: Diagnosis not present

## 2013-04-18 DIAGNOSIS — T82898A Other specified complication of vascular prosthetic devices, implants and grafts, initial encounter: Secondary | ICD-10-CM | POA: Diagnosis not present

## 2013-04-19 DIAGNOSIS — D631 Anemia in chronic kidney disease: Secondary | ICD-10-CM | POA: Diagnosis not present

## 2013-04-19 DIAGNOSIS — N2581 Secondary hyperparathyroidism of renal origin: Secondary | ICD-10-CM | POA: Diagnosis not present

## 2013-04-19 DIAGNOSIS — T82898A Other specified complication of vascular prosthetic devices, implants and grafts, initial encounter: Secondary | ICD-10-CM | POA: Diagnosis not present

## 2013-04-19 DIAGNOSIS — N186 End stage renal disease: Secondary | ICD-10-CM | POA: Diagnosis not present

## 2013-04-22 ENCOUNTER — Other Ambulatory Visit: Payer: Self-pay | Admitting: *Deleted

## 2013-04-22 ENCOUNTER — Ambulatory Visit (HOSPITAL_COMMUNITY): Payer: Medicare Other | Admitting: Certified Registered Nurse Anesthetist

## 2013-04-22 ENCOUNTER — Encounter: Payer: Self-pay | Admitting: Surgery

## 2013-04-22 ENCOUNTER — Inpatient Hospital Stay (HOSPITAL_COMMUNITY)
Admission: AD | Admit: 2013-04-22 | Discharge: 2013-04-23 | DRG: 264 | Disposition: A | Discharge status: Home Health | Payer: Medicare Other | Source: Ambulatory Visit | Attending: Vascular Surgery | Admitting: Vascular Surgery

## 2013-04-22 ENCOUNTER — Ambulatory Visit (HOSPITAL_COMMUNITY): Payer: Medicare Other

## 2013-04-22 ENCOUNTER — Encounter (HOSPITAL_COMMUNITY): Payer: Self-pay | Admitting: Certified Registered Nurse Anesthetist

## 2013-04-22 ENCOUNTER — Encounter (HOSPITAL_COMMUNITY)
Admission: AD | Disposition: A | Discharge status: Home Health | Payer: Self-pay | Source: Ambulatory Visit | Attending: Vascular Surgery

## 2013-04-22 ENCOUNTER — Ambulatory Visit (INDEPENDENT_AMBULATORY_CARE_PROVIDER_SITE_OTHER): Payer: Medicare Other | Admitting: Surgery

## 2013-04-22 VITALS — BP 92/53 | HR 107 | Temp 98.4°F | Ht 71.5 in | Wt 324.0 lb

## 2013-04-22 DIAGNOSIS — IMO0002 Reserved for concepts with insufficient information to code with codable children: Secondary | ICD-10-CM | POA: Diagnosis present

## 2013-04-22 DIAGNOSIS — T827XXA Infection and inflammatory reaction due to other cardiac and vascular devices, implants and grafts, initial encounter: Secondary | ICD-10-CM | POA: Diagnosis not present

## 2013-04-22 DIAGNOSIS — D509 Iron deficiency anemia, unspecified: Secondary | ICD-10-CM | POA: Diagnosis present

## 2013-04-22 DIAGNOSIS — T82898A Other specified complication of vascular prosthetic devices, implants and grafts, initial encounter: Secondary | ICD-10-CM

## 2013-04-22 DIAGNOSIS — N186 End stage renal disease: Secondary | ICD-10-CM | POA: Diagnosis not present

## 2013-04-22 DIAGNOSIS — E119 Type 2 diabetes mellitus without complications: Secondary | ICD-10-CM | POA: Diagnosis present

## 2013-04-22 DIAGNOSIS — I120 Hypertensive chronic kidney disease with stage 5 chronic kidney disease or end stage renal disease: Secondary | ICD-10-CM | POA: Diagnosis present

## 2013-04-22 DIAGNOSIS — N2581 Secondary hyperparathyroidism of renal origin: Secondary | ICD-10-CM | POA: Diagnosis not present

## 2013-04-22 DIAGNOSIS — N039 Chronic nephritic syndrome with unspecified morphologic changes: Secondary | ICD-10-CM | POA: Diagnosis not present

## 2013-04-22 DIAGNOSIS — Z452 Encounter for adjustment and management of vascular access device: Secondary | ICD-10-CM | POA: Diagnosis not present

## 2013-04-22 DIAGNOSIS — I809 Phlebitis and thrombophlebitis of unspecified site: Secondary | ICD-10-CM | POA: Diagnosis not present

## 2013-04-22 DIAGNOSIS — D631 Anemia in chronic kidney disease: Secondary | ICD-10-CM | POA: Diagnosis not present

## 2013-04-22 DIAGNOSIS — Y832 Surgical operation with anastomosis, bypass or graft as the cause of abnormal reaction of the patient, or of later complication, without mention of misadventure at the time of the procedure: Secondary | ICD-10-CM | POA: Diagnosis present

## 2013-04-22 DIAGNOSIS — I10 Essential (primary) hypertension: Secondary | ICD-10-CM | POA: Diagnosis not present

## 2013-04-22 DIAGNOSIS — Z992 Dependence on renal dialysis: Secondary | ICD-10-CM | POA: Diagnosis not present

## 2013-04-22 HISTORY — PX: LIGATION OF ARTERIOVENOUS  FISTULA: SHX5948

## 2013-04-22 HISTORY — PX: INSERTION OF DIALYSIS CATHETER: SHX1324

## 2013-04-22 SURGERY — LIGATION OF ARTERIOVENOUS  FISTULA
Anesthesia: General | Site: Neck | Laterality: Right | Wound class: Dirty or Infected

## 2013-04-22 MED ORDER — SODIUM CHLORIDE 0.9 % IV SOLN
INTRAVENOUS | Status: DC | PRN
  Administered 2013-04-22 (×2): via INTRAVENOUS

## 2013-04-22 MED ORDER — HEPARIN SODIUM (PORCINE) 1000 UNIT/ML IJ SOLN
INTRAMUSCULAR | Status: DC | PRN
  Administered 2013-04-22: 5000 [IU]

## 2013-04-22 MED ORDER — VANCOMYCIN HCL 10 G IV SOLR
2500.0000 mg | Freq: Once | INTRAVENOUS | Status: AC
  Administered 2013-04-23: 2500 mg via INTRAVENOUS
  Filled 2013-04-22: qty 2500

## 2013-04-22 MED ORDER — OXYCODONE HCL 5 MG/5ML PO SOLN: 5.0000 mg | Freq: Once | ORAL | Status: AC | PRN

## 2013-04-22 MED ORDER — HEPARIN SODIUM (PORCINE) 1000 UNIT/ML IJ SOLN
INTRAMUSCULAR | Status: AC
  Filled 2013-04-22: qty 1

## 2013-04-22 MED ORDER — SODIUM CHLORIDE 0.9 % IV SOLN
INTRAVENOUS | Status: DC
  Administered 2013-04-22: via INTRAVENOUS

## 2013-04-22 MED ORDER — LIDOCAINE-EPINEPHRINE 0.5 %-1:200000 IJ SOLN
INTRAMUSCULAR | Status: DC | PRN
  Administered 2013-04-22: 50 mL

## 2013-04-22 MED ORDER — MIDAZOLAM HCL 5 MG/5ML IJ SOLN
INTRAMUSCULAR | Status: DC | PRN
  Administered 2013-04-22: 2 mg via INTRAVENOUS

## 2013-04-22 MED ORDER — SODIUM CHLORIDE 0.9 % IV SOLN: 250.0000 mL | INTRAVENOUS | Status: DC | PRN

## 2013-04-22 MED ORDER — PROPOFOL 10 MG/ML IV BOLUS
INTRAVENOUS | Status: DC | PRN
  Administered 2013-04-22: 200 mg via INTRAVENOUS
  Administered 2013-04-22 (×4): 100 mg via INTRAVENOUS

## 2013-04-22 MED ORDER — PHENYLEPHRINE HCL 10 MG/ML IJ SOLN
INTRAMUSCULAR | Status: DC | PRN
  Administered 2013-04-22 (×5): 120 ug via INTRAVENOUS

## 2013-04-22 MED ORDER — HYDROMORPHONE HCL PF 1 MG/ML IJ SOLN
INTRAMUSCULAR | Status: AC
  Filled 2013-04-22: qty 1

## 2013-04-22 MED ORDER — ONDANSETRON HCL 4 MG/2ML IJ SOLN
INTRAMUSCULAR | Status: DC | PRN
  Administered 2013-04-22: 4 mg via INTRAVENOUS

## 2013-04-22 MED ORDER — PROMETHAZINE HCL 25 MG/ML IJ SOLN: 6.2500 mg | INTRAMUSCULAR | Status: DC | PRN

## 2013-04-22 MED ORDER — HYDROMORPHONE HCL PF 1 MG/ML IJ SOLN
INTRAMUSCULAR | Status: DC | PRN
  Administered 2013-04-22: 1 mg via INTRAVENOUS

## 2013-04-22 MED ORDER — SODIUM CHLORIDE 0.9 % IJ SOLN
3.0000 mL | Freq: Two times a day (BID) | INTRAMUSCULAR | Status: DC
  Administered 2013-04-23: 3 mL via INTRAVENOUS

## 2013-04-22 MED ORDER — LIDOCAINE HCL (CARDIAC) 20 MG/ML IV SOLN
INTRAVENOUS | Status: DC | PRN
  Administered 2013-04-22: 100 mg via INTRAVENOUS

## 2013-04-22 MED ORDER — FENTANYL CITRATE 0.05 MG/ML IJ SOLN
INTRAMUSCULAR | Status: DC | PRN
  Administered 2013-04-22 (×4): 50 ug via INTRAVENOUS
  Administered 2013-04-22: 100 ug via INTRAVENOUS
  Administered 2013-04-22: 50 ug via INTRAVENOUS
  Administered 2013-04-22: 150 ug via INTRAVENOUS

## 2013-04-22 MED ORDER — ALBUMIN HUMAN 5 % IV SOLN
INTRAVENOUS | Status: AC
  Administered 2013-04-22: 12.5 g via INTRAVENOUS
  Filled 2013-04-22: qty 250

## 2013-04-22 MED ORDER — OXYCODONE HCL 5 MG PO TABS: 5.0000 mg | ORAL_TABLET | Freq: Once | ORAL | Status: AC | PRN

## 2013-04-22 MED ORDER — DEXTROSE 5 % IV SOLN
1.5000 g | INTRAVENOUS | Status: AC
  Administered 2013-04-22: 1.5 g via INTRAVENOUS
  Filled 2013-04-22: qty 1.5

## 2013-04-22 MED ORDER — SODIUM CHLORIDE 0.9 % IR SOLN
Status: DC | PRN
  Administered 2013-04-22 (×2)

## 2013-04-22 MED ORDER — LIDOCAINE-EPINEPHRINE 0.5 %-1:200000 IJ SOLN
INTRAMUSCULAR | Status: AC
  Filled 2013-04-22: qty 1

## 2013-04-22 MED ORDER — PHENYLEPHRINE HCL 10 MG/ML IJ SOLN
10.0000 mg | INTRAVENOUS | Status: DC | PRN
  Administered 2013-04-22: 25 ug/min via INTRAVENOUS

## 2013-04-22 MED ORDER — SODIUM CHLORIDE 0.9 % IJ SOLN: 3.0000 mL | INTRAMUSCULAR | Status: DC | PRN

## 2013-04-22 MED ORDER — HYDROMORPHONE HCL PF 1 MG/ML IJ SOLN
0.2500 mg | INTRAMUSCULAR | Status: DC | PRN
  Administered 2013-04-22 (×3): 0.5 mg via INTRAVENOUS

## 2013-04-22 MED ORDER — SODIUM CHLORIDE 0.9 % IR SOLN
Status: DC | PRN
  Administered 2013-04-22: 1000 mL

## 2013-04-22 MED ORDER — ALBUMIN HUMAN 5 % IV SOLN: 12.5000 g | Freq: Once | INTRAVENOUS | Status: DC

## 2013-04-22 SURGICAL SUPPLY — 64 items
BAG DECANTER FOR FLEXI CONT (MISCELLANEOUS) ×6 IMPLANT
BANDAGE ELASTIC 6 VELCRO ST LF (GAUZE/BANDAGES/DRESSINGS) ×3 IMPLANT
BANDAGE GAUZE ELAST BULKY 4 IN (GAUZE/BANDAGES/DRESSINGS) ×3 IMPLANT
BENZOIN TINCTURE PRP APPL 2/3 (GAUZE/BANDAGES/DRESSINGS) ×3 IMPLANT
CANISTER SUCTION 2500CC (MISCELLANEOUS) ×3 IMPLANT
CATH CANNON HEMO 15F 50CM (CATHETERS) IMPLANT
CATH CANNON HEMO 15FR 19 (HEMODIALYSIS SUPPLIES) IMPLANT
CATH CANNON HEMO 15FR 23CM (HEMODIALYSIS SUPPLIES) ×3 IMPLANT
CATH CANNON HEMO 15FR 31CM (HEMODIALYSIS SUPPLIES) IMPLANT
CATH CANNON HEMO 15FR 32CM (HEMODIALYSIS SUPPLIES) IMPLANT
CLIP LIGATING EXTRA MED SLVR (CLIP) ×3 IMPLANT
CLIP LIGATING EXTRA SM BLUE (MISCELLANEOUS) ×3 IMPLANT
CLOTH BEACON ORANGE TIMEOUT ST (SAFETY) ×3 IMPLANT
CLSR STERI-STRIP ANTIMIC 1/2X4 (GAUZE/BANDAGES/DRESSINGS) ×3 IMPLANT
COVER PROBE W GEL 5X96 (DRAPES) ×3 IMPLANT
COVER SURGICAL LIGHT HANDLE (MISCELLANEOUS) ×3 IMPLANT
DECANTER SPIKE VIAL GLASS SM (MISCELLANEOUS) ×3 IMPLANT
DRAPE C-ARM 42X72 X-RAY (DRAPES) ×3 IMPLANT
DRAPE CHEST BREAST 15X10 FENES (DRAPES) ×3 IMPLANT
ELECT REM PT RETURN 9FT ADLT (ELECTROSURGICAL) ×3
ELECTRODE REM PT RTRN 9FT ADLT (ELECTROSURGICAL) ×2 IMPLANT
GAUZE SPONGE 2X2 8PLY STRL LF (GAUZE/BANDAGES/DRESSINGS) ×2 IMPLANT
GAUZE SPONGE 4X4 16PLY XRAY LF (GAUZE/BANDAGES/DRESSINGS) ×3 IMPLANT
GEL ULTRASOUND 20GR AQUASONIC (MISCELLANEOUS) IMPLANT
GLOVE BIOGEL PI IND STRL 7.5 (GLOVE) ×2 IMPLANT
GLOVE BIOGEL PI INDICATOR 7.5 (GLOVE) ×1
GLOVE SS BIOGEL STRL SZ 7.5 (GLOVE) ×2 IMPLANT
GLOVE SUPERSENSE BIOGEL SZ 7.5 (GLOVE) ×1
GLOVE SURG SS PI 6.5 STRL IVOR (GLOVE) ×3 IMPLANT
GLOVE SURG SS PI 7.5 STRL IVOR (GLOVE) ×3 IMPLANT
GOWN STRL NON-REIN LRG LVL3 (GOWN DISPOSABLE) ×6 IMPLANT
KIT BASIN OR (CUSTOM PROCEDURE TRAY) ×6 IMPLANT
KIT ROOM TURNOVER OR (KITS) ×3 IMPLANT
NEEDLE 18GX1X1/2 (RX/OR ONLY) (NEEDLE) ×3 IMPLANT
NEEDLE 22X1 1/2 (OR ONLY) (NEEDLE) ×3 IMPLANT
NEEDLE HYPO 25GX1X1/2 BEV (NEEDLE) ×3 IMPLANT
NS IRRIG 1000ML POUR BTL (IV SOLUTION) ×3 IMPLANT
PACK CV ACCESS (CUSTOM PROCEDURE TRAY) ×3 IMPLANT
PACK SURGICAL SETUP 50X90 (CUSTOM PROCEDURE TRAY) ×3 IMPLANT
PAD ARMBOARD 7.5X6 YLW CONV (MISCELLANEOUS) ×6 IMPLANT
SLEEVE SURGEON STRL (DRAPES) ×3 IMPLANT
SOAP 2 % CHG 4 OZ (WOUND CARE) ×3 IMPLANT
SPONGE GAUZE 2X2 STER 10/PKG (GAUZE/BANDAGES/DRESSINGS) ×1
SPONGE GAUZE 4X4 12PLY (GAUZE/BANDAGES/DRESSINGS) ×3 IMPLANT
STAPLER VISISTAT 35W (STAPLE) IMPLANT
STRIP CLOSURE SKIN 1/2X4 (GAUZE/BANDAGES/DRESSINGS) ×3 IMPLANT
SUT ETHILON 3 0 PS 1 (SUTURE) ×9 IMPLANT
SUT PROLENE 5 0 C 1 24 (SUTURE) ×15 IMPLANT
SUT PROLENE 6 0 CC (SUTURE) IMPLANT
SUT SILK 0 TIES 10X30 (SUTURE) IMPLANT
SUT VIC AB 3-0 SH 27 (SUTURE) ×3
SUT VIC AB 3-0 SH 27X BRD (SUTURE) ×6 IMPLANT
SUT VICRYL 4-0 PS2 18IN ABS (SUTURE) ×3 IMPLANT
SWAB COLLECTION DEVICE MRSA (MISCELLANEOUS) ×3 IMPLANT
SYR 20CC LL (SYRINGE) ×3 IMPLANT
SYR 30ML LL (SYRINGE) ×3 IMPLANT
SYR 5ML LL (SYRINGE) ×6 IMPLANT
SYR CONTROL 10ML LL (SYRINGE) ×3 IMPLANT
SYRINGE 10CC LL (SYRINGE) ×3 IMPLANT
TOWEL OR 17X24 6PK STRL BLUE (TOWEL DISPOSABLE) ×3 IMPLANT
TOWEL OR 17X26 10 PK STRL BLUE (TOWEL DISPOSABLE) ×3 IMPLANT
TUBE ANAEROBIC SPECIMEN COL (MISCELLANEOUS) ×3 IMPLANT
UNDERPAD 30X30 INCONTINENT (UNDERPADS AND DIAPERS) ×3 IMPLANT
WATER STERILE IRR 1000ML POUR (IV SOLUTION) ×3 IMPLANT

## 2013-04-22 NOTE — Progress Notes (Signed)
ANTIBIOTIC CONSULT NOTE - INITIAL  Pharmacy Consult for Vancomycin Indication: infected AVF  No Known Allergies  Patient Measurements: Height: 5' 11.5" (181.6 cm) Weight: 315 lb (142.883 kg) IBW/kg (Calculated) : 76.45 Adjusted Body Weight: 105 kg  Vital Signs: Temp: 97.4 F (36.3 C) (06/30 2230) Temp src: Oral (06/30 1804) BP: 95/50 mmHg (06/30 2315) Pulse Rate: 87 (06/30 2315) Intake/Output from previous day:   Intake/Output from this shift: Total I/O In: 800 [I.V.:800] Out: 300 [Blood:300]  Labs: No results found for this basename: WBC, HGB, PLT, LABCREA, CREATININE,  in the last 72 hours Estimated Creatinine Clearance: 11 ml/min (by C-G formula based on Cr of 12.51). No results found for this basename: VANCOTROUGH, VANCOPEAK, VANCORANDOM, GENTTROUGH, GENTPEAK, GENTRANDOM, TOBRATROUGH, TOBRAPEAK, TOBRARND, AMIKACINPEAK, AMIKACINTROU, AMIKACIN,  in the last 72 hours   Microbiology: No results found for this or any previous visit (from the past 720 hour(s)).  Medical History: Past Medical History  Diagnosis Date  . Chronic kidney disease   . Hypertension   . Hemodialysis finding May 2 ,2005    First HD at [facility].  . Anemia     Iron deficiency  . End stage renal disease on dialysis   . Aneurysm 02/15/2007    Right arm large aneurysm    Medications:  Prescriptions prior to admission  Medication Sig Dispense Refill  . cinacalcet (SENSIPAR) 60 MG tablet Take 60 mg by mouth daily.      . multivitamin (RENA-VIT) TABS tablet Take 1 tablet by mouth daily.      . ranitidine (ZANTAC) 15 MG/ML syrup Take by mouth 2 (two) times daily.      . ranitidine (ZANTAC) 150 MG capsule Take 150 mg by mouth as needed.      . sevelamer (RENVELA) 800 MG tablet Take 800 mg by mouth 3 (three) times daily with meals.       Assessment: 42 yo male s/p AVF abcess resection for empiric antibiotics  Goal of Therapy:  Vancomycin trough level 10-15 mcg/ml  Plan:  Vancomycin 2500  mg IV now, then 1 g IV after each HD  Yuko Coventry, Bronson Curb 04/22/2013,11:31 PM

## 2013-04-22 NOTE — H&P (View-Only) (Signed)
[facility]   Patient name: [name] MRN: [id number] DOB: [birth year] Sex: male     Chief Complaint  Patient presents with  . Re-evaluation    abscess on fistula - Dr. Otelia Santee    HISTORY OF PRESENT ILLNESS: The patient presents today for evaluation of his right upper arm fistula. It was created by Dr. Kellie Simmering in 2005. Dr. early perform branch ligation. He has had issues with infection in the past as he has been known to pick and superficial ulcerations. Last week he developed an area of fluctuance which was lanced by Dr. Garlon Hatchet. He comes in today for further evaluation. He has had possibly drained from the areas that were lanced.  Past Medical History  Diagnosis Date  . Chronic kidney disease   . Hypertension   . Hemodialysis finding May 2 ,2005    [facility] HD at [facility].  . Anemia     Iron deficiency  . End stage renal disease on dialysis   . Aneurysm 02/15/2007    Right arm large aneurysm  . Diabetes mellitus 08/07/2007    Past Surgical History  Procedure Laterality Date  . Arteriovenous graft placement  11/02/2004    Right arm AVF    History   Social History  . Marital Status: Single    Spouse Name: N/A    Number of Children: N/A  . Years of Education: N/A   Occupational History  . Not on file.   Social History Main Topics  . Smoking status: Never Smoker   . Smokeless tobacco: Never Used  . Alcohol Use: No  . Drug Use: Yes     Comment: occasional  . Sexually Active: Not on file   Other Topics Concern  . Not on file   Social History Narrative  . No narrative on file    Family History  Problem Relation Age of Onset  . Hypertension Mother   . Heart disease Mother     before age 47  . Other Brother     DVT    Allergies as of 04/22/2013  . (No Known Allergies)    Current Outpatient Prescriptions on File Prior to Visit  Medication Sig Dispense Refill  . cinacalcet (SENSIPAR) 60 MG tablet Take 60 mg by mouth  daily.      . multivitamin (RENA-VIT) TABS tablet Take 1 tablet by mouth daily.      . ranitidine (ZANTAC) 15 MG/ML syrup Take by mouth 2 (two) times daily.      . ranitidine (ZANTAC) 150 MG capsule Take 150 mg by mouth as needed.      . sevelamer (RENVELA) 800 MG tablet Take 800 mg by mouth 3 (three) times daily with meals.       No current facility-administered medications on file prior to visit.     REVIEW OF SYSTEMS: See history of present illness, all other systems are negative  PHYSICAL EXAMINATION:   Vital signs are BP 92/53  Pulse 107  Temp(Src) 98.4 F (36.9 C) (Oral)  Ht 5' 11.5" (1.816 m)  Wt 324 lb (146.965 kg)  BMI 44.56 kg/m2  SpO2 98% General: The patient appears their stated age. HEENT:  No gross abnormalities Pulmonary:  Non labored breathing Musculoskeletal: There are no major deformities. Neurologic: No focal weakness or paresthesias are detected, Skin: There are no ulcer or rashes noted. Psychiatric: The patient has normal affect. Cardiovascular: There is a regular rate and rhythm without significant murmur appreciated.  Aneurysmal right upper arm fistula with thinning of the skin in 2 areas which does blanch to palpation and is slightly warm. There are several areas of skin breakdown one of which is draining purulent fluid.   Diagnostic Studies None  Assessment: Abscess, right upper arm fistula Plan: The patient comes in today with pus draining from his fistula. There are also several areas where the overlying skin is not intact. I suspect that the fistula itself or at least a segment of the aneurysmal portion has become infected. I do not think that the fistula is salvageable. I am concerned about rupture and life-threatening bleeding. I have recommended that he get up to the hospital immediately for fistula ligation and catheter placement. The patient understands this and is willing to proceed at this time.  Eldridge Abrahams, M.D. [facility] Office: [phone] Pager:  [phone]

## 2013-04-22 NOTE — Interval H&P Note (Signed)
History and Physical Interval Note:  04/22/2013 7:40 PM  [name]  has presented today for surgery, with the diagnosis of INFECTED AVF  The various methods of treatment have been discussed with the patient and family. After consideration of risks, benefits and other options for treatment, the patient has consented to  Procedure(s): LIGATION OF ARTERIOVENOUS  FISTULA (Right) INSERTION OF DIALYSIS CATHETER (N/A) as a surgical intervention .  The patient's history has been reviewed, patient examined, no change in status, stable for surgery.  I have reviewed the patient's chart and labs.  Questions were answered to the patient's satisfaction.     Tulio Facundo

## 2013-04-22 NOTE — Anesthesia Procedure Notes (Signed)
Procedure Name: LMA Insertion Date/Time: 04/22/2013 8:00 PM Performed by: Trixie Deis A Pre-anesthesia Checklist: Patient identified, Timeout performed, Emergency Drugs available, Suction available and Patient being monitored Patient Re-evaluated:Patient Re-evaluated prior to inductionOxygen Delivery Method: Circle system utilized Preoxygenation: Pre-oxygenation with 100% oxygen Intubation Type: IV induction Ventilation: Mask ventilation without difficulty and Oral airway inserted - appropriate to patient size LMA: LMA with gastric port inserted LMA Size: 5.0 Placement Confirmation: breath sounds checked- equal and bilateral and positive ETCO2 Tube secured with: Tape Dental Injury: Teeth and Oropharynx as per pre-operative assessment

## 2013-04-22 NOTE — Progress Notes (Signed)
Spoke with Dr Tobias Alexander about patients history of HTn and that he currently is not being treated for HTN.  Orders received that an EKG and CXR are not necessary.

## 2013-04-22 NOTE — Transfer of Care (Signed)
Immediate Anesthesia Transfer of Care Note  Patient: [name]  Procedure(s) Performed: Procedure(s): LIGATION OF ARTERIOVENOUS  FISTULA  AND RESECTION OF VENOUS ANEURYSM AND ABSCESS (Right) INSERTION OF DIALYSIS CATHETER (Right)  Patient Location: PACU  Anesthesia Type:General  Level of Consciousness: awake and alert   Airway & Oxygen Therapy: Patient Spontanous Breathing and Patient connected to face mask oxygen  Post-op Assessment: Report given to PACU RN, Post -op Vital signs reviewed and stable and Patient moving all extremities  Post vital signs: Reviewed and stable  Complications: No apparent anesthesia complications

## 2013-04-22 NOTE — Preoperative (Signed)
Beta Blockers   Reason not to administer Beta Blockers:Not Applicable 

## 2013-04-22 NOTE — Op Note (Signed)
OPERATIVE REPORT  DATE OF SURGERY: 04/22/2013  PATIENT: [name], 42 y.o. male MRN: [id number]  DOB: 02/13/1971  PRE-OPERATIVE DIAGNOSIS: Abscess in mega fistula right upper arm  POST-OPERATIVE DIAGNOSIS:  Same  PROCEDURE: Resection of venous aneurysm and abscess right upper arm AV fistula and placement of right IJ dietetic catheter with ultrasound visualization  SURGEON:  Curt Jews, M.D.   ASSISTANT: Nurse  ANESTHESIA:  Gen.  EBL: 300 ml  Total I/O In: 500 [I.V.:500] Out: 300 [Blood:300]  BLOOD ADMINISTERED: None  DRAINS: None  SPECIMEN: Venous aneurysm to pathology and aerobic and anaerobic cultures of purulence and abscess  COUNTS CORRECT:  YES  PLAN OF CARE: PACU   PATIENT DISPOSITION:  PACU - hemodynamically stable  PROCEDURE DETAILS: Patient is a 42 year old gentleman with long-standing use of right upper arm AV fistula. His had significant venous dilatation venous aneurysms. He reports that he had been having some drainage and this was lanced at an outpatient dialysis access center several days ago. He has had persistent erythema and drainage of pus and he presented to our office this evening. He had obvious fluctuance and concern for potential rupture. He therefore was recommended he go emergently to the operating room for drainage and probable resection. Did explain that he may need a dialysis catheter if the fistula had to be resected  The patient was placed in supine position where general endotracheal anesthesia was administered. Incision was made at the brachial artery anastomosis to control the fistula. Once this was controlled the fistula was occluded and the area of aneurysm in the mid upper arm was opened. There were several areas that were draining pus and these tracked down to the venous aneurysm. There was actually a purulence surrounding the entire upper surface of this. This was sent for culture and sensitivity. It is obvious is no way to salvage  this fistula. Next an incision was made towards the shoulder to control the venous outflow and this was a ligated with 2-0 silk tie. The incision was extended to incorporate the several areas there were draining pus in the midportion of the upper arm and a venous aneurysm and this was excised in its entirety. Both the ends were oversewn with running 5-0 Prolene suture. The vein was oversewn after dividing it at the brachial artery anastomosis again this was with several layers of 5-0 Prolene suture. The patient had a second venous aneurysm that was near the antecubital space and this was resected in its entirety as well. There was bleeding from a branch of further immediately off of the basilic vein and a separate incision was made over this for control and ligation of this. The wound irrigated with saline. Hemostasis obtained with cautery. The middle incision that was a purulent was packed with Betadine soaked 4 x 4's. 2 3-0 nylon sutures were placed in a mattress fashion the were not tied down to E. close later it is cleaned up. The remainder of the incisions were closed with 3-0 Vicryl the subcutaneous and subcuticular tissue. Benzoin stricture for applied over these. The arm was wrapped with Curlex and a six-inch race Ace wrap.  Attention was then turned to the dialysis catheter placement. The patient was reprepped and draped and the new instruments were opened for use in saline were contaminated by the purulent first per the procedure. The patient patient delivered position and the neck and chest prepped in usual fashion. Ultrasound revealed widely patent jugular vein on the right. Using an 18-gauge needle and  the Seldinger technique a guidewire is passed down to the level the right atrium. A dilator and catheter peel-away sheath was passed over this. The catheter was placed through the peel-away sheath the peel-away sheath was removed. The catheter tips were positioned level the distal right atrium this was  confirmed with fluoroscopy. The catheter was then brought to separate subcutaneous tunnel and the 2 lm ports were attached. Both lumens flushed and aspirated easily reluctant without unit per cc heparin. The catheter was secured to the skin with a 3-0 nylon stitch and the underside for the for septic Vicryl stitch. The patient was taken to the recovery room where chest x-ray is pending   Curt Jews, M.D. 04/22/2013 10:27 PM

## 2013-04-22 NOTE — Progress Notes (Signed)
Vascular and Vein Specialist of [facility]   Patient name: [name] MRN: [id number] DOB: February 16, 1971 Sex: male     Chief Complaint  Patient presents with  . Re-evaluation    abscess on fistula - Dr. Otelia Santee    HISTORY OF PRESENT ILLNESS: The patient presents today for evaluation of his right upper arm fistula. It was created by Dr. Kellie Simmering in 2005. Dr. early perform branch ligation. He has had issues with infection in the past as he has been known to pick and superficial ulcerations. Last week he developed an area of fluctuance which was lanced by Dr. Garlon Hatchet. He comes in today for further evaluation. He has had possibly drained from the areas that were lanced.  Past Medical History  Diagnosis Date  . Chronic kidney disease   . Hypertension   . Hemodialysis finding May 2 ,2005    First HD at [facility].  . Anemia     Iron deficiency  . End stage renal disease on dialysis   . Aneurysm 02/15/2007    Right arm large aneurysm  . Diabetes mellitus 08/07/2007    Past Surgical History  Procedure Laterality Date  . Arteriovenous graft placement  11/02/2004    Right arm AVF    History   Social History  . Marital Status: Single    Spouse Name: N/A    Number of Children: N/A  . Years of Education: N/A   Occupational History  . Not on file.   Social History Main Topics  . Smoking status: Never Smoker   . Smokeless tobacco: Never Used  . Alcohol Use: No  . Drug Use: Yes     Comment: occasional  . Sexually Active: Not on file   Other Topics Concern  . Not on file   Social History Narrative  . No narrative on file    Family History  Problem Relation Age of Onset  . Hypertension Mother   . Heart disease Mother     before age 7  . Other Brother     DVT    Allergies as of 04/22/2013  . (No Known Allergies)    Current Outpatient Prescriptions on File Prior to Visit  Medication Sig Dispense Refill  . cinacalcet (SENSIPAR) 60 MG tablet Take 60 mg by mouth  daily.      . multivitamin (RENA-VIT) TABS tablet Take 1 tablet by mouth daily.      . ranitidine (ZANTAC) 15 MG/ML syrup Take by mouth 2 (two) times daily.      . ranitidine (ZANTAC) 150 MG capsule Take 150 mg by mouth as needed.      . sevelamer (RENVELA) 800 MG tablet Take 800 mg by mouth 3 (three) times daily with meals.       No current facility-administered medications on file prior to visit.     REVIEW OF SYSTEMS: See history of present illness, all other systems are negative  PHYSICAL EXAMINATION:   Vital signs are BP 92/53  Pulse 107  Temp(Src) 98.4 F (36.9 C) (Oral)  Ht 5' 11.5" (1.816 m)  Wt 324 lb (146.965 kg)  BMI 44.56 kg/m2  SpO2 98% General: The patient appears their stated age. HEENT:  No gross abnormalities Pulmonary:  Non labored breathing Musculoskeletal: There are no major deformities. Neurologic: No focal weakness or paresthesias are detected, Skin: There are no ulcer or rashes noted. Psychiatric: The patient has normal affect. Cardiovascular: There is a regular rate and rhythm without significant murmur appreciated.  Aneurysmal right upper arm fistula with thinning of the skin in 2 areas which does blanch to palpation and is slightly warm. There are several areas of skin breakdown one of which is draining purulent fluid.   Diagnostic Studies None  Assessment: Abscess, right upper arm fistula Plan: The patient comes in today with pus draining from his fistula. There are also several areas where the overlying skin is not intact. I suspect that the fistula itself or at least a segment of the aneurysmal portion has become infected. I do not think that the fistula is salvageable. I am concerned about rupture and life-threatening bleeding. I have recommended that he get up to the hospital immediately for fistula ligation and catheter placement. The patient understands this and is willing to proceed at this time.  Eldridge Abrahams, M.D. V[facility] Ve[facility]  Specialists of [place] Office: [phone] Pager:  [phone]

## 2013-04-22 NOTE — Anesthesia Preprocedure Evaluation (Addendum)
Anesthesia Evaluation    Reviewed: Allergy & Precautions, H&P , NPO status , Patient's Chart, lab work & pertinent test results  Airway       Dental   Pulmonary Current Smoker,          Cardiovascular hypertension,     Neuro/Psych negative neurological ROS  negative psych ROS   GI/Hepatic negative GI ROS, Neg liver ROS,   Endo/Other  diabetes  Renal/GU CRF and DialysisRenal disease     Musculoskeletal   Abdominal   Peds  Hematology   Anesthesia Other Findings   Reproductive/Obstetrics                          Anesthesia Physical Anesthesia Plan  ASA: III  Anesthesia Plan: General   Post-op Pain Management:    Induction: Intravenous and Rapid sequence  Airway Management Planned:   Additional Equipment:   Intra-op Plan:   Post-operative Plan: Extubation in OR  Informed Consent:   Plan Discussed with: CRNA, Anesthesiologist and Surgeon  Anesthesia Plan Comments:         Anesthesia Quick Evaluation

## 2013-04-23 ENCOUNTER — Other Ambulatory Visit: Payer: Self-pay | Admitting: *Deleted

## 2013-04-23 ENCOUNTER — Telehealth: Payer: Self-pay | Admitting: Vascular Surgery

## 2013-04-23 DIAGNOSIS — Z01810 Encounter for preprocedural cardiovascular examination: Secondary | ICD-10-CM

## 2013-04-23 DIAGNOSIS — T82598A Other mechanical complication of other cardiac and vascular devices and implants, initial encounter: Secondary | ICD-10-CM

## 2013-04-23 DIAGNOSIS — N186 End stage renal disease: Secondary | ICD-10-CM

## 2013-04-23 LAB — BASIC METABOLIC PANEL
Calcium: 9 mg/dL (ref 8.4–10.5)
GFR calc Af Amer: 5 mL/min — ABNORMAL LOW (ref >90)
GFR calc non Af Amer: 4 mL/min — ABNORMAL LOW (ref >90)
Glucose, Bld: 99 mg/dL (ref 70–99)
Potassium: 5 mEq/L (ref 3.5–5.1)
Sodium: 136 mEq/L (ref 135–145)

## 2013-04-23 LAB — MRSA PCR SCREENING: MRSA by PCR: NEGATIVE

## 2013-04-23 LAB — CBC
Hemoglobin: 11.5 g/dL — ABNORMAL LOW (ref 13.0–17.0)
MCHC: 34 g/dL (ref 30.0–36.0)
Platelets: 222 10*3/uL (ref 150–400)

## 2013-04-23 LAB — POCT I-STAT 4, (NA,K, GLUC, HGB,HCT)
Glucose, Bld: 95 mg/dL (ref 70–99)
HCT: 41 % (ref 39.0–52.0)
Hemoglobin: 13.9 g/dL (ref 13.0–17.0)
Potassium: 8.4 mEq/L (ref 3.5–5.1)
Potassium: 4.7 mEq/L (ref 3.5–5.1)
Sodium: 134 mEq/L — ABNORMAL LOW (ref 135–145)

## 2013-04-23 MED ORDER — ONDANSETRON HCL 4 MG/2ML IJ SOLN: 4.0000 mg | Freq: Four times a day (QID) | INTRAMUSCULAR | Status: DC | PRN

## 2013-04-23 MED ORDER — RENA-VITE PO TABS
1.0000 | ORAL_TABLET | Freq: Every day | ORAL | Status: DC
  Filled 2013-04-23: qty 1

## 2013-04-23 MED ORDER — VANCOMYCIN HCL IN DEXTROSE 1-5 GM/200ML-% IV SOLN: 1000.0000 mg | INTRAVENOUS | Status: DC

## 2013-04-23 MED ORDER — MORPHINE SULFATE 2 MG/ML IJ SOLN
2.0000 mg | INTRAMUSCULAR | Status: DC | PRN
  Administered 2013-04-23: 2 mg via INTRAVENOUS
  Filled 2013-04-23: qty 1
  Filled 2013-04-23: qty 2

## 2013-04-23 MED ORDER — OXYCODONE HCL 5 MG PO TABS
5.0000 mg | ORAL_TABLET | ORAL | Status: DC | PRN
  Administered 2013-04-23 (×2): 10 mg via ORAL
  Filled 2013-04-23 (×2): qty 2

## 2013-04-23 MED ORDER — OXYCODONE HCL 5 MG PO TABS: 5.0000 mg | ORAL_TABLET | ORAL | Status: DC | PRN

## 2013-04-23 MED ORDER — PHENOL 1.4 % MT LIQD: 1.0000 | OROMUCOSAL | Status: DC | PRN

## 2013-04-23 MED ORDER — CINACALCET HCL 30 MG PO TABS
60.0000 mg | ORAL_TABLET | Freq: Every day | ORAL | Status: DC
Start: 2013-04-23 — End: 2013-04-23
  Administered 2013-04-23: 60 mg via ORAL
  Filled 2013-04-23 (×2): qty 2

## 2013-04-23 MED ORDER — LABETALOL HCL 5 MG/ML IV SOLN
10.0000 mg | INTRAVENOUS | Status: DC | PRN
  Filled 2013-04-23: qty 4

## 2013-04-23 MED ORDER — METOPROLOL TARTRATE 1 MG/ML IV SOLN
2.0000 mg | INTRAVENOUS | Status: DC | PRN
  Filled 2013-04-23: qty 5

## 2013-04-23 MED ORDER — SEVELAMER CARBONATE 800 MG PO TABS
800.0000 mg | ORAL_TABLET | Freq: Three times a day (TID) | ORAL | Status: DC
  Administered 2013-04-23 (×2): 800 mg via ORAL
  Filled 2013-04-23 (×4): qty 1

## 2013-04-23 MED ORDER — HYDRALAZINE HCL 20 MG/ML IJ SOLN: 10.0000 mg | INTRAMUSCULAR | Status: DC | PRN

## 2013-04-23 NOTE — Progress Notes (Addendum)
  VASCULAR AND VEIN SURGERY PROGRESS NOTE  POST-OP HEMODIALYSIS ACCESS  Date of Surgery: 04/22/2013 - 04/23/2013 Surgeon: Juliann Mule): Rosetta Posner, MD 1 Day Post-Op Right Upper arm LIGATION OF ARTERIOVENOUS  FISTULA  AND RESECTION OF VENOUS ANEURYSM AND ABSCESS INSERTION OF DIALYSIS CATHETER   HPI: [name] is a 42 y.o. male who is 1 Day Post-Op removal infected  right upper extremity Hemodialysis access. The patient denies symptoms of numbness, tingling, weakness; denies pain in the operative limb.   Significant Diagnostic Studies: CBC Lab Results  Component Value Date   WBC 8.5 04/23/2013   HGB 11.5* 04/23/2013   HCT 33.8* 04/23/2013   MCV 91.4 04/23/2013   PLT 222 04/23/2013    BMET    Component Value Date/Time   NA 136 04/23/2013 0605   K 5.0 04/23/2013 0605   CL 95* 04/23/2013 0605   CO2 29 04/23/2013 0605   GLUCOSE 99 04/23/2013 0605   BUN 33* 04/23/2013 0605   CREATININE 12.01* 04/23/2013 0605   CALCIUM 9.0 04/23/2013 0605   GFRNONAA 4* 04/23/2013 0605   GFRAA 5* 04/23/2013 0605    COAG No results found for this basename: INR, PROTIME   No results found for this basename: PTT    Vital Signs  BP Readings from Last 3 Encounters:  04/23/13 87/58  04/22/13 92/53  04/23/13 87/58   Temp Readings from Last 3 Encounters:  04/23/13 99.1 F (37.3 C) Oral  04/22/13 98.4 F (36.9 C) Oral  04/23/13 99.1 F (37.3 C) Oral   SpO2 Readings from Last 3 Encounters:  04/23/13 100%  04/22/13 98%  04/23/13 100%   Pulse Readings from Last 3 Encounters:  04/23/13 94  04/22/13 107  04/23/13 94     Physical Examination  right upper Incision is healing well, skin color is normal , hand grip is 5/5, sensation in digits is intact;  The open wound is clean, serous drainage Repacked with wet to dry dressing Closed incisions look good.  Assessment/Plan Ramiz SYAIR TRAVAGLINI is a 42 y.o. year old male who presents s/p removal infected right upper extremity Hemodialysis access. Follow-up in 1  week The patient's catheter ready to use Home today after H/H dressing changes set up  Annetta J 04/23/2013 9:23 AM

## 2013-04-23 NOTE — Care Management Note (Signed)
   CARE MANAGEMENT NOTE 04/23/2013  Patient:  [name], [name]   Account Number:  [id number]  Date Initiated:  04/23/2013  Documentation initiated by:  Demetrick Eichenberger  Subjective/Objective Assessment:   Order for [facility] for dressing changes.     Action/Plan:   Met with pt and friend, his friend will assit with dressing changes , she has agreed. They were given list of [facility] provider in [place] and [place] counties. Await their decision.   Anticipated DC Date:  04/23/2013   Anticipated DC Plan:  [place]         [facility] Choice  HOME HEALTH   Choice offered to / List presented to:  C-1 Patient        [place] arranged  HH-1 RN      [place]   Status of service:  Completed, signed off Medicare Important Message given?   (If response is "NO", the following Medicare IM given date fields will be blank) Date Medicare IM given:   Date Additional Medicare IM given:    Discharge Disposition:  [place]  Per UR Regulation:    If discussed at Long Length of Stay Meetings, dates discussed:    Comments:

## 2013-04-23 NOTE — Discharge Summary (Signed)
Vascular [facility] Vein Specialists Discharge Summary   Patient ID:  [name] MRN: [id number] DOB/AGE: September 17, 1971 42 y.o.  Admit date: 04/22/2013 Discharge date: 04/23/2013 Date of Surgery: 04/22/2013 - 04/23/2013 Surgeon: Juliann Mule): Rosetta Posner, MD  Admission Diagnosis: INFECTED AVF  Discharge Diagnoses:  INFECTED AVF  Secondary Diagnoses: Past Medical History  Diagnosis Date  . Chronic kidney disease   . Hypertension   . Hemodialysis finding May 2 ,2005    First HD at [facility].  . Anemia     Iron deficiency  . End stage renal disease on dialysis   . Aneurysm 02/15/2007    Right arm large aneurysm    Procedure(s): LIGATION OF ARTERIOVENOUS  FISTULA  AND RESECTION OF VENOUS ANEURYSM AND ABSCESS INSERTION OF DIALYSIS CATHETER  Discharged Condition: good  HPI:  The patient presents today for evaluation of his right upper arm fistula. It was created by Dr. Kellie Simmering in 2005. Dr. early perform branch ligation. He has had issues with infection in the past as he has been known to pick and superficial ulcerations. Last week he developed an area of fluctuance which was lanced by Dr. Garlon Hatchet. He comes in today for further evaluation. He has had possibly drained from the areas that were lanced. The patient comes in today with pus draining from his fistula. There are also several areas where the overlying skin is not intact. I suspect that the fistula itself or at least a segment of the aneurysmal portion has become infected. I do not think that the fistula is salvageable. I am concerned about rupture and life-threatening bleeding. I have recommended that he get up to the hospital immediately for fistula ligation and catheter placement. The patient understands this and is willing to proceed at this time.   Hospital Course:  [name] is a 42 y.o. male is S/P Resection of venous aneurysm and abscess right upper arm AV fistula and placement of right IJ dietetic catheter with  ultrasound visualization Extubated: POD # 0 Physical exam: right arm with 2+ radial pulse Good sensation and motion in hand  Open wound mid upper arm is open/ clean Post-op wounds healing well Pt. Ambulating, voiding and taking PO diet without difficulty. Pt pain controlled with PO pain meds. Labs as below Complications:none  Consults:     Significant Diagnostic Studies: CBC Lab Results  Component Value Date   WBC 8.5 04/23/2013   HGB 11.5* 04/23/2013   HCT 33.8* 04/23/2013   MCV 91.4 04/23/2013   PLT 222 04/23/2013    BMET    Component Value Date/Time   NA 136 04/23/2013 0605   K 5.0 04/23/2013 0605   CL 95* 04/23/2013 0605   CO2 29 04/23/2013 0605   GLUCOSE 99 04/23/2013 0605   BUN 33* 04/23/2013 0605   CREATININE 12.01* 04/23/2013 0605   CALCIUM 9.0 04/23/2013 0605   GFRNONAA 4* 04/23/2013 0605   GFRAA 5* 04/23/2013 0605   COAG No results found for this basename: INR, PROTIME     Disposition:  Discharge to :Home Discharge Orders   Future Appointments Provider Department Dept Phone   05/09/2013 4:20 PM Princess Perna, NP [facility] [phone]   Future Orders Complete By Expires     Call MD for:  redness, tenderness, or signs of infection (pain, swelling, bleeding, redness, odor or green/yellow discharge around incision site)  As directed     Call MD for:  severe or increased pain, loss or decreased feeling  in affected limb(s)  As directed     Call MD for:  temperature >100.5  As directed     Discharge wound care:  As directed     Comments:      Wet to dry dressing once daily to right arm wound, dry dressings over other wounds    Driving Restrictions  As directed     Comments:      No driving for at least 48 hours and you are off pain medication    Increase activity slowly  As directed     Comments:      Walk with assistance use walker or cane as needed    Lifting restrictions  As directed     Comments:      No lifting for 4 weeks    Resume  previous diet  As directed         Medication List         cinacalcet 60 MG tablet  Commonly known as:  SENSIPAR  Take 60 mg by mouth daily.     multivitamin Tabs tablet  Take 1 tablet by mouth daily.     oxyCODONE 5 MG immediate release tablet  Commonly known as:  Oxy IR/ROXICODONE  Take 1-2 tablets (5-10 mg total) by mouth every 4 (four) hours as needed.     ranitidine 150 MG capsule  Commonly known as:  ZANTAC  Take 150 mg by mouth as needed.     RENVELA 800 MG tablet  Generic drug:  sevelamer carbonate  Take 800 mg by mouth 3 (three) times daily with meals.       Verbal and written Discharge instructions given to the patient. Wound care per Discharge AVS     Follow-up Information   Follow up with EARLY, TODD, MD In 1 week. (office will arange)    Contact information:   [address] [place] [address] [phone]       Signed: Richrd Prime 04/23/2013, 9:27 AM

## 2013-04-23 NOTE — Anesthesia Postprocedure Evaluation (Signed)
Anesthesia Post Note  Patient: [name]  Procedure(s) Performed: Procedure(s) (LRB): LIGATION OF ARTERIOVENOUS  FISTULA  AND RESECTION OF VENOUS ANEURYSM AND ABSCESS (Right) INSERTION OF DIALYSIS CATHETER (Right)  Anesthesia type: general  Patient location: PACU  Post pain: Pain level controlled  Post assessment: Patient's Cardiovascular Status Stable  Last Vitals:  Filed Vitals:   04/23/13 0015  BP: 93/49  Pulse: 83  Temp:   Resp: 25    Post vital signs: Reviewed and stable  Level of consciousness: sedated  Complications: No apparent anesthesia complications

## 2013-04-23 NOTE — Progress Notes (Signed)
Patient ID: [name], male   DOB: 1971/05/03, 42 y.o.   MRN: [id number] The patient is comfortable this morning. His arm dressing was removed and the opened abscess area was repacked. There is no evidence of bleeding. No evidence of pus. He will be discharged home today. He dialyzes on Monday Wednesday and Friday so therefore we'll dialyze tomorrow and aspirin to his usual time. I will see him in one week. We will arrange home health nurse for dressing changes to the open area in his right upper arm.

## 2013-04-23 NOTE — Progress Notes (Signed)
Nursing Note - Late Entry  Pt arrived to [id number] via stretcher from PACU at 0100. A&OX4, VSS, no distress noted. Pt rates his pain 3/10. Telemetry in place. 2L O2 n/c in place post op. RUA with ACE wrap in place - circulation check WNL. Rt chest with new rt IJ HD catheter; DSD with dried blood present. Pt is from home with family. Oriented to unit and surroundings. Call bell within reach. Bed alarm on. Will continue to monitor. C.Frankee Gritz, RN.

## 2013-04-23 NOTE — Telephone Encounter (Addendum)
Message copied by Doristine Section on Tue Apr 23, 2013 10:18 AM ------      Message from: Richrd Prime      Created: Tue Apr 23, 2013  8:01 AM       1 week F/U removal infected graft - Early ------  notified patient of fu appt. on 04-30-13 3:15 with tfe

## 2013-04-24 ENCOUNTER — Encounter (HOSPITAL_COMMUNITY): Payer: Self-pay | Admitting: Vascular Surgery

## 2013-04-24 DIAGNOSIS — D631 Anemia in chronic kidney disease: Secondary | ICD-10-CM | POA: Diagnosis not present

## 2013-04-24 DIAGNOSIS — E119 Type 2 diabetes mellitus without complications: Secondary | ICD-10-CM | POA: Diagnosis not present

## 2013-04-24 DIAGNOSIS — I120 Hypertensive chronic kidney disease with stage 5 chronic kidney disease or end stage renal disease: Secondary | ICD-10-CM | POA: Diagnosis not present

## 2013-04-24 DIAGNOSIS — T827XXA Infection and inflammatory reaction due to other cardiac and vascular devices, implants and grafts, initial encounter: Secondary | ICD-10-CM | POA: Diagnosis not present

## 2013-04-24 DIAGNOSIS — B958 Unspecified staphylococcus as the cause of diseases classified elsewhere: Secondary | ICD-10-CM | POA: Diagnosis not present

## 2013-04-24 DIAGNOSIS — N186 End stage renal disease: Secondary | ICD-10-CM | POA: Diagnosis not present

## 2013-04-24 DIAGNOSIS — D509 Iron deficiency anemia, unspecified: Secondary | ICD-10-CM | POA: Diagnosis not present

## 2013-04-24 DIAGNOSIS — N2581 Secondary hyperparathyroidism of renal origin: Secondary | ICD-10-CM | POA: Diagnosis not present

## 2013-04-24 DIAGNOSIS — T82898A Other specified complication of vascular prosthetic devices, implants and grafts, initial encounter: Secondary | ICD-10-CM | POA: Diagnosis not present

## 2013-04-25 DIAGNOSIS — E119 Type 2 diabetes mellitus without complications: Secondary | ICD-10-CM | POA: Diagnosis not present

## 2013-04-25 DIAGNOSIS — T827XXA Infection and inflammatory reaction due to other cardiac and vascular devices, implants and grafts, initial encounter: Secondary | ICD-10-CM | POA: Diagnosis not present

## 2013-04-25 DIAGNOSIS — N186 End stage renal disease: Secondary | ICD-10-CM | POA: Diagnosis not present

## 2013-04-25 DIAGNOSIS — I120 Hypertensive chronic kidney disease with stage 5 chronic kidney disease or end stage renal disease: Secondary | ICD-10-CM | POA: Diagnosis not present

## 2013-04-25 LAB — CULTURE, ROUTINE-ABSCESS: Gram Stain: NONE SEEN

## 2013-04-26 DIAGNOSIS — I120 Hypertensive chronic kidney disease with stage 5 chronic kidney disease or end stage renal disease: Secondary | ICD-10-CM | POA: Diagnosis not present

## 2013-04-26 DIAGNOSIS — E119 Type 2 diabetes mellitus without complications: Secondary | ICD-10-CM | POA: Diagnosis not present

## 2013-04-26 DIAGNOSIS — T827XXA Infection and inflammatory reaction due to other cardiac and vascular devices, implants and grafts, initial encounter: Secondary | ICD-10-CM | POA: Diagnosis not present

## 2013-04-26 DIAGNOSIS — N186 End stage renal disease: Secondary | ICD-10-CM | POA: Diagnosis not present

## 2013-04-27 DIAGNOSIS — I120 Hypertensive chronic kidney disease with stage 5 chronic kidney disease or end stage renal disease: Secondary | ICD-10-CM | POA: Diagnosis not present

## 2013-04-27 DIAGNOSIS — N186 End stage renal disease: Secondary | ICD-10-CM | POA: Diagnosis not present

## 2013-04-27 DIAGNOSIS — T827XXA Infection and inflammatory reaction due to other cardiac and vascular devices, implants and grafts, initial encounter: Secondary | ICD-10-CM | POA: Diagnosis not present

## 2013-04-27 DIAGNOSIS — E119 Type 2 diabetes mellitus without complications: Secondary | ICD-10-CM | POA: Diagnosis not present

## 2013-04-28 DIAGNOSIS — N186 End stage renal disease: Secondary | ICD-10-CM | POA: Diagnosis not present

## 2013-04-28 DIAGNOSIS — E119 Type 2 diabetes mellitus without complications: Secondary | ICD-10-CM | POA: Diagnosis not present

## 2013-04-28 DIAGNOSIS — I120 Hypertensive chronic kidney disease with stage 5 chronic kidney disease or end stage renal disease: Secondary | ICD-10-CM | POA: Diagnosis not present

## 2013-04-28 DIAGNOSIS — T827XXA Infection and inflammatory reaction due to other cardiac and vascular devices, implants and grafts, initial encounter: Secondary | ICD-10-CM | POA: Diagnosis not present

## 2013-04-29 ENCOUNTER — Encounter: Payer: Self-pay | Admitting: Vascular Surgery

## 2013-04-29 DIAGNOSIS — N186 End stage renal disease: Secondary | ICD-10-CM | POA: Diagnosis not present

## 2013-04-29 DIAGNOSIS — T827XXA Infection and inflammatory reaction due to other cardiac and vascular devices, implants and grafts, initial encounter: Secondary | ICD-10-CM | POA: Diagnosis not present

## 2013-04-29 DIAGNOSIS — E119 Type 2 diabetes mellitus without complications: Secondary | ICD-10-CM | POA: Diagnosis not present

## 2013-04-29 DIAGNOSIS — I120 Hypertensive chronic kidney disease with stage 5 chronic kidney disease or end stage renal disease: Secondary | ICD-10-CM | POA: Diagnosis not present

## 2013-04-30 ENCOUNTER — Encounter: Payer: Self-pay | Admitting: Vascular Surgery

## 2013-04-30 ENCOUNTER — Encounter (INDEPENDENT_AMBULATORY_CARE_PROVIDER_SITE_OTHER): Payer: Medicare Other | Admitting: Vascular Surgery

## 2013-04-30 ENCOUNTER — Ambulatory Visit (INDEPENDENT_AMBULATORY_CARE_PROVIDER_SITE_OTHER): Payer: Medicare Other | Admitting: Vascular Surgery

## 2013-04-30 ENCOUNTER — Ambulatory Visit: Payer: Medicare Other | Admitting: Vascular Surgery

## 2013-04-30 VITALS — BP 110/74 | HR 83 | Resp 20 | Ht 71.5 in | Wt 315.3 lb

## 2013-04-30 DIAGNOSIS — N186 End stage renal disease: Secondary | ICD-10-CM

## 2013-04-30 DIAGNOSIS — Z01810 Encounter for preprocedural cardiovascular examination: Secondary | ICD-10-CM | POA: Diagnosis not present

## 2013-04-30 DIAGNOSIS — T82598A Other mechanical complication of other cardiac and vascular devices and implants, initial encounter: Secondary | ICD-10-CM

## 2013-04-30 NOTE — Progress Notes (Signed)
Here today for followup of removal of infected right upper arm  fistula due to infection artery one week ago. He has been very well with a home health nurse doing dressing changes. He had a very large open deficit in his mid upper arm and this is granulating and closing nicely. He will continue with local wound care. He is having very good function of his new hemodialysis catheter which was placed the same setting.  He does need a new permanent access. He underwent vein mapping today which showed a good caliber of his cephalic vein at the wrist and the upper arm. The basilic vein also has good caliber in the upper arm.  I have recommended AV fistula placement as soon as possible to shorten his life the need for catheter. We will schedule this for Friday, 05/03/2013 as an outpatient

## 2013-05-01 ENCOUNTER — Other Ambulatory Visit: Payer: Self-pay

## 2013-05-01 ENCOUNTER — Encounter (HOSPITAL_COMMUNITY): Payer: Self-pay | Admitting: Pharmacy Technician

## 2013-05-02 ENCOUNTER — Encounter (HOSPITAL_COMMUNITY): Payer: Self-pay | Admitting: *Deleted

## 2013-05-02 DIAGNOSIS — N186 End stage renal disease: Secondary | ICD-10-CM | POA: Diagnosis not present

## 2013-05-02 DIAGNOSIS — I120 Hypertensive chronic kidney disease with stage 5 chronic kidney disease or end stage renal disease: Secondary | ICD-10-CM | POA: Diagnosis not present

## 2013-05-02 DIAGNOSIS — E119 Type 2 diabetes mellitus without complications: Secondary | ICD-10-CM | POA: Diagnosis not present

## 2013-05-02 DIAGNOSIS — T827XXA Infection and inflammatory reaction due to other cardiac and vascular devices, implants and grafts, initial encounter: Secondary | ICD-10-CM | POA: Diagnosis not present

## 2013-05-02 MED ORDER — DEXTROSE 5 % IV SOLN
1.5000 g | INTRAVENOUS | Status: AC
  Administered 2013-05-03: 1.5 g via INTRAVENOUS
  Filled 2013-05-02: qty 1.5

## 2013-05-02 MED ORDER — DEXTROSE 5 % IV SOLN
1.5000 g | INTRAVENOUS | Status: DC
  Filled 2013-05-02: qty 1.5

## 2013-05-02 NOTE — Progress Notes (Signed)
Pt denies SOB, chest pain, being under the care of a cardiologist, having an echo, cardiac cath, and EKG within the last year.  Pt states that he had a stress test over 5 years ago at [facility]. Pt advised to stop taking Aspirin and herbal medications. Do not take any NSAIDs ie: Ibuprofen, Advil, Naproxen or any medication containing Aspirin.

## 2013-05-03 ENCOUNTER — Ambulatory Visit (HOSPITAL_COMMUNITY)
Admission: RE | Admit: 2013-05-03 | Discharge: 2013-05-03 | Disposition: A | Discharge status: Home / Self Care | Payer: Medicare Other | Source: Ambulatory Visit | Attending: Vascular Surgery | Admitting: Vascular Surgery

## 2013-05-03 ENCOUNTER — Telehealth: Payer: Self-pay | Admitting: Vascular Surgery

## 2013-05-03 ENCOUNTER — Ambulatory Visit (HOSPITAL_COMMUNITY): Payer: Medicare Other | Admitting: Certified Registered Nurse Anesthetist

## 2013-05-03 ENCOUNTER — Encounter (HOSPITAL_COMMUNITY): Payer: Self-pay | Admitting: Certified Registered Nurse Anesthetist

## 2013-05-03 ENCOUNTER — Encounter (HOSPITAL_COMMUNITY)
Admission: RE | Disposition: A | Discharge status: Home / Self Care | Payer: Self-pay | Source: Ambulatory Visit | Attending: Vascular Surgery

## 2013-05-03 ENCOUNTER — Ambulatory Visit (HOSPITAL_COMMUNITY): Payer: Medicare Other

## 2013-05-03 DIAGNOSIS — N186 End stage renal disease: Secondary | ICD-10-CM | POA: Diagnosis not present

## 2013-05-03 DIAGNOSIS — I10 Essential (primary) hypertension: Secondary | ICD-10-CM | POA: Diagnosis not present

## 2013-05-03 DIAGNOSIS — K219 Gastro-esophageal reflux disease without esophagitis: Secondary | ICD-10-CM | POA: Diagnosis not present

## 2013-05-03 DIAGNOSIS — T8140XA Infection following a procedure, unspecified, initial encounter: Secondary | ICD-10-CM | POA: Insufficient documentation

## 2013-05-03 DIAGNOSIS — Z79899 Other long term (current) drug therapy: Secondary | ICD-10-CM | POA: Insufficient documentation

## 2013-05-03 DIAGNOSIS — Y832 Surgical operation with anastomosis, bypass or graft as the cause of abnormal reaction of the patient, or of later complication, without mention of misadventure at the time of the procedure: Secondary | ICD-10-CM | POA: Insufficient documentation

## 2013-05-03 DIAGNOSIS — D649 Anemia, unspecified: Secondary | ICD-10-CM | POA: Diagnosis not present

## 2013-05-03 HISTORY — DX: Gastro-esophageal reflux disease without esophagitis: K21.9

## 2013-05-03 HISTORY — PX: AV FISTULA PLACEMENT: SHX1204

## 2013-05-03 LAB — POCT I-STAT 4, (NA,K, GLUC, HGB,HCT)
Glucose, Bld: 93 mg/dL (ref 70–99)
Potassium: 5.1 mEq/L (ref 3.5–5.1)

## 2013-05-03 SURGERY — ARTERIOVENOUS (AV) FISTULA CREATION
Anesthesia: General | Site: Arm Lower | Laterality: Left | Wound class: Clean

## 2013-05-03 MED ORDER — PROPOFOL 10 MG/ML IV BOLUS
INTRAVENOUS | Status: DC | PRN
  Administered 2013-05-03: 400 mg via INTRAVENOUS
  Administered 2013-05-03: 30 mg via INTRAVENOUS

## 2013-05-03 MED ORDER — OXYCODONE-ACETAMINOPHEN 5-325 MG PO TABS: 1.0000 | ORAL_TABLET | Freq: Four times a day (QID) | ORAL | Status: DC | PRN

## 2013-05-03 MED ORDER — HYDROMORPHONE HCL PF 1 MG/ML IJ SOLN
INTRAMUSCULAR | Status: AC
  Filled 2013-05-03: qty 1

## 2013-05-03 MED ORDER — MIDAZOLAM HCL 5 MG/5ML IJ SOLN
INTRAMUSCULAR | Status: DC | PRN
  Administered 2013-05-03: 2 mg via INTRAVENOUS

## 2013-05-03 MED ORDER — LIDOCAINE HCL (CARDIAC) 20 MG/ML IV SOLN
INTRAVENOUS | Status: DC | PRN
  Administered 2013-05-03: 100 mg via INTRAVENOUS

## 2013-05-03 MED ORDER — SODIUM CHLORIDE 0.9 % IV SOLN
INTRAVENOUS | Status: DC | PRN
  Administered 2013-05-03: 13:00:00 via INTRAVENOUS

## 2013-05-03 MED ORDER — FENTANYL CITRATE 0.05 MG/ML IJ SOLN
INTRAMUSCULAR | Status: DC | PRN
  Administered 2013-05-03: 100 ug via INTRAVENOUS

## 2013-05-03 MED ORDER — SUCCINYLCHOLINE CHLORIDE 20 MG/ML IJ SOLN
INTRAMUSCULAR | Status: DC | PRN
  Administered 2013-05-03: 200 mg via INTRAVENOUS

## 2013-05-03 MED ORDER — LACTATED RINGERS IV SOLN: INTRAVENOUS | Status: DC | PRN

## 2013-05-03 MED ORDER — LIDOCAINE-EPINEPHRINE 0.5 %-1:200000 IJ SOLN
INTRAMUSCULAR | Status: AC
  Filled 2013-05-03: qty 1

## 2013-05-03 MED ORDER — ROCURONIUM BROMIDE 100 MG/10ML IV SOLN
INTRAVENOUS | Status: DC | PRN
  Administered 2013-05-03: 50 mg via INTRAVENOUS

## 2013-05-03 MED ORDER — SODIUM CHLORIDE 0.9 % IR SOLN
Status: DC | PRN
  Administered 2013-05-03: 12:00:00

## 2013-05-03 MED ORDER — SODIUM CHLORIDE 0.9 % IV SOLN
INTRAVENOUS | Status: DC
  Administered 2013-05-03: 12:00:00 via INTRAVENOUS

## 2013-05-03 MED ORDER — LIDOCAINE-EPINEPHRINE 0.5 %-1:200000 IJ SOLN
INTRAMUSCULAR | Status: DC | PRN
  Administered 2013-05-03: 7 mL

## 2013-05-03 MED ORDER — PHENYLEPHRINE HCL 10 MG/ML IJ SOLN
10.0000 mg | INTRAVENOUS | Status: DC | PRN
  Administered 2013-05-03: 40 ug/min via INTRAVENOUS

## 2013-05-03 MED ORDER — ONDANSETRON HCL 4 MG/2ML IJ SOLN
INTRAMUSCULAR | Status: DC | PRN
  Administered 2013-05-03: 4 mg via INTRAVENOUS

## 2013-05-03 MED ORDER — NEOSTIGMINE METHYLSULFATE 1 MG/ML IJ SOLN
INTRAMUSCULAR | Status: DC | PRN
  Administered 2013-05-03: 5 mg via INTRAVENOUS

## 2013-05-03 MED ORDER — 0.9 % SODIUM CHLORIDE (POUR BTL) OPTIME
TOPICAL | Status: DC | PRN
  Administered 2013-05-03: 1000 mL

## 2013-05-03 MED ORDER — HYDROMORPHONE HCL PF 1 MG/ML IJ SOLN
0.2500 mg | INTRAMUSCULAR | Status: DC | PRN
  Administered 2013-05-03 (×2): 0.5 mg via INTRAVENOUS

## 2013-05-03 MED ORDER — GLYCOPYRROLATE 0.2 MG/ML IJ SOLN
INTRAMUSCULAR | Status: DC | PRN
  Administered 2013-05-03: .8 mg via INTRAVENOUS

## 2013-05-03 SURGICAL SUPPLY — 36 items
ARMBAND PINK RESTRICT EXTREMIT (MISCELLANEOUS) ×2 IMPLANT
BENZOIN TINCTURE PRP APPL 2/3 (GAUZE/BANDAGES/DRESSINGS) ×2 IMPLANT
CANISTER SUCTION 2500CC (MISCELLANEOUS) ×2 IMPLANT
CLIP LIGATING EXTRA MED SLVR (CLIP) ×2 IMPLANT
CLIP LIGATING EXTRA SM BLUE (MISCELLANEOUS) ×2 IMPLANT
CLOTH BEACON ORANGE TIMEOUT ST (SAFETY) ×2 IMPLANT
CLSR STERI-STRIP ANTIMIC 1/2X4 (GAUZE/BANDAGES/DRESSINGS) ×2 IMPLANT
COVER PROBE W GEL 5X96 (DRAPES) ×2 IMPLANT
COVER SURGICAL LIGHT HANDLE (MISCELLANEOUS) ×2 IMPLANT
DECANTER SPIKE VIAL GLASS SM (MISCELLANEOUS) ×2 IMPLANT
ELECT REM PT RETURN 9FT ADLT (ELECTROSURGICAL) ×2
ELECTRODE REM PT RTRN 9FT ADLT (ELECTROSURGICAL) ×1 IMPLANT
GEL ULTRASOUND 20GR AQUASONIC (MISCELLANEOUS) IMPLANT
GLOVE BIOGEL PI IND STRL 6.5 (GLOVE) ×3 IMPLANT
GLOVE BIOGEL PI IND STRL 7.0 (GLOVE) ×1 IMPLANT
GLOVE BIOGEL PI INDICATOR 6.5 (GLOVE) ×3
GLOVE BIOGEL PI INDICATOR 7.0 (GLOVE) ×1
GLOVE ECLIPSE 6.5 STRL STRAW (GLOVE) ×4 IMPLANT
GLOVE SS BIOGEL STRL SZ 7.5 (GLOVE) ×1 IMPLANT
GLOVE SUPERSENSE BIOGEL SZ 7.5 (GLOVE) ×1
GLOVE SURG SS PI 7.0 STRL IVOR (GLOVE) ×2 IMPLANT
GOWN STRL NON-REIN LRG LVL3 (GOWN DISPOSABLE) ×4 IMPLANT
KIT BASIN OR (CUSTOM PROCEDURE TRAY) ×2 IMPLANT
KIT ROOM TURNOVER OR (KITS) ×2 IMPLANT
NS IRRIG 1000ML POUR BTL (IV SOLUTION) ×2 IMPLANT
PACK CV ACCESS (CUSTOM PROCEDURE TRAY) ×2 IMPLANT
PAD ARMBOARD 7.5X6 YLW CONV (MISCELLANEOUS) ×4 IMPLANT
SPONGE GAUZE 4X4 12PLY (GAUZE/BANDAGES/DRESSINGS) ×2 IMPLANT
STRIP CLOSURE SKIN 1/2X4 (GAUZE/BANDAGES/DRESSINGS) ×2 IMPLANT
SUT PROLENE 6 0 CC (SUTURE) ×2 IMPLANT
SUT VIC AB 3-0 SH 27 (SUTURE) ×1
SUT VIC AB 3-0 SH 27X BRD (SUTURE) ×1 IMPLANT
TOWEL OR 17X24 6PK STRL BLUE (TOWEL DISPOSABLE) ×2 IMPLANT
TOWEL OR 17X26 10 PK STRL BLUE (TOWEL DISPOSABLE) ×2 IMPLANT
UNDERPAD 30X30 INCONTINENT (UNDERPADS AND DIAPERS) ×2 IMPLANT
WATER STERILE IRR 1000ML POUR (IV SOLUTION) ×2 IMPLANT

## 2013-05-03 NOTE — Anesthesia Procedure Notes (Signed)
Procedure Name: Intubation Date/Time: 05/03/2013 12:58 PM Performed by: Trixie Deis A Pre-anesthesia Checklist: Patient identified, Emergency Drugs available, Suction available, Patient being monitored and Timeout performed Patient Re-evaluated:Patient Re-evaluated prior to inductionOxygen Delivery Method: Circle system utilized Preoxygenation: Pre-oxygenation with 100% oxygen Intubation Type: IV induction Ventilation: Oral airway inserted - appropriate to patient size and Mask ventilation with difficulty Laryngoscope Size: Mac and 4 Grade View: Grade III Tube type: Oral Tube size: 7.5 mm Number of attempts: 2 (DLx1 CRNA, DLx1 MDA) Airway Equipment and Method: Stylet,  LTA kit utilized and Bougie stylet Placement Confirmation: ETT inserted through vocal cords under direct vision,  positive ETCO2 and breath sounds checked- equal and bilateral Secured at: 22 cm Tube secured with: Tape Dental Injury: Teeth and Oropharynx as per pre-operative assessment

## 2013-05-03 NOTE — Transfer of Care (Signed)
Immediate Anesthesia Transfer of Care Note  Patient: [name]  Procedure(s) Performed: Procedure(s): ARTERIOVENOUS (AV) FISTULA CREATION (Left)  Patient Location: PACU  Anesthesia Type:General  Level of Consciousness: awake, alert  and patient cooperative  Airway & Oxygen Therapy: Patient Spontanous Breathing and Patient connected to nasal cannula oxygen  Post-op Assessment: Report given to PACU RN, Post -op Vital signs reviewed and stable and Patient moving all extremities  Post vital signs: Reviewed and stable  Complications: No apparent anesthesia complications

## 2013-05-03 NOTE — Anesthesia Preprocedure Evaluation (Signed)
Anesthesia Evaluation  Patient identified by MRN, date of birth, ID band Patient awake    Reviewed: Allergy & Precautions, H&P , NPO status , Patient's Chart, lab work & pertinent test results  Airway Mallampati: II      Dental   Pulmonary neg pulmonary ROS,  breath sounds clear to auscultation        Cardiovascular Pt. on medications Rhythm:Regular Rate:Normal     Neuro/Psych negative neurological ROS     GI/Hepatic Neg liver ROS, GERD-  ,  Endo/Other  negative endocrine ROS  Renal/GU Renal disease     Musculoskeletal   Abdominal   Peds  Hematology   Anesthesia Other Findings   Reproductive/Obstetrics                           Anesthesia Physical Anesthesia Plan  ASA: III  Anesthesia Plan: General   Post-op Pain Management:    Induction: Intravenous  Airway Management Planned: Oral ETT  Additional Equipment:   Intra-op Plan:   Post-operative Plan: Extubation in OR  Informed Consent: I have reviewed the patients History and Physical, chart, labs and discussed the procedure including the risks, benefits and alternatives for the proposed anesthesia with the patient or authorized representative who has indicated his/her understanding and acceptance.     Plan Discussed with: CRNA, Anesthesiologist and Surgeon  Anesthesia Plan Comments:         Anesthesia Quick Evaluation

## 2013-05-03 NOTE — Progress Notes (Signed)
[facility]  Mr. [name] may return to work Wednesday 16th of July 2014 with out restrictions.    Chanan Detwiler MAUREEN PA-C

## 2013-05-03 NOTE — Op Note (Signed)
OPERATIVE REPORT  DATE OF SURGERY: 05/03/2013  PATIENT: [name], 42 y.o. male MRN: [id number]  DOB: 04-07-1971  PRE-OPERATIVE DIAGNOSIS: End-stage renal disease  POST-OPERATIVE DIAGNOSIS:  Same  PROCEDURE: Left wrist Cimino AV fistula  SURGEON:  Curt Jews, M.D.  PHYSICIAN ASSISTANT: Collins  ANESTHESIA:  Gen.  EBL: Minimal ml     BLOOD ADMINISTERED: None  DRAINS: None  SPECIMEN: None  COUNTS CORRECT:  YES  PLAN OF CARE: PACU   PATIENT DISPOSITION:  PACU - hemodynamically stable  PROCEDURE DETAILS: The patient was taken to the operating room placed supine position where the area of the left arm is prepped and draped in the usual sterile fashion. A ultrasound SonoSite was used to visualize the cephalic vein which was of acceptable caliber at the wrist. Local anesthesia was instilled in this area an incision was made between the level of the cephalic vein and the radial artery at the wrist. The vein was of adequate size. Curvature branches were ligated with 30 and 4-0 silk ties and divided. The vein was ligated distally and was mobilized to the level of the radial artery. The radial artery was of good caliber. The artery was occluded proximally and distally was opened with an 11 blade and extended longitudinally with Potts scissors. The vein was spatulated and sewn end-to-side to the artery with a running 6-0 Prolene suture. Clamps removed and good thrill was noted. Wound irrigated with saline. Hemostasis electrocautery. The wounds were closed with 3-0 Vicryl in the subcutaneous and subcuticular tissue. A sterile dressing was applied and the patient was taken to the recovery room in stable condition   Curt Jews, M.D. 05/03/2013 2:28 PM

## 2013-05-03 NOTE — Interval H&P Note (Signed)
History and Physical Interval Note:  05/03/2013 12:12 PM  [name]  has presented today for surgery, with the diagnosis of End Stage Renal Disease  The various methods of treatment have been discussed with the patient and family. After consideration of risks, benefits and other options for treatment, the patient has consented to  Procedure(s): ARTERIOVENOUS (AV) FISTULA CREATION (Left) as a surgical intervention .  The patient's history has been reviewed, patient examined, no change in status, stable for surgery.  I have reviewed the patient's chart and labs.  Questions were answered to the patient's satisfaction.     [name]

## 2013-05-03 NOTE — Anesthesia Postprocedure Evaluation (Signed)
  Anesthesia Post-op Note  Patient: [name]  Procedure(s) Performed: Procedure(s): ARTERIOVENOUS (AV) FISTULA CREATION (Left)  Patient Location: PACU  Anesthesia Type:General  Level of Consciousness: awake, oriented, sedated and patient cooperative  Airway and Oxygen Therapy: Patient Spontanous Breathing  Post-op Pain: mild  Post-op Assessment: Post-op Vital signs reviewed, Patient's Cardiovascular Status Stable, Respiratory Function Stable, Patent Airway, No signs of Nausea or vomiting and Pain level controlled  Post-op Vital Signs: stable  Complications: No apparent anesthesia complications

## 2013-05-03 NOTE — Telephone Encounter (Signed)
Message copied by Berniece Salines on Fri May 03, 2013  4:32 PM ------      Message from: Denman George      Created: Fri May 03, 2013  3:53 PM                   ----- Message -----         From: Ulyses Amor, PA-C         Sent: 05/03/2013   1:59 PM           To: Alfonso Patten, RN            F/U 6 week Dr. Donnetta Hutching  Left forearm AV Fistula ------

## 2013-05-03 NOTE — H&P (View-Only) (Signed)
Here today for followup of removal of infected right upper arm  fistula due to infection artery one week ago. He has been very well with a home health nurse doing dressing changes. He had a very large open deficit in his mid upper arm and this is granulating and closing nicely. He will continue with local wound care. He is having very good function of his new hemodialysis catheter which was placed the same setting.  He does need a new permanent access. He underwent vein mapping today which showed a good caliber of his cephalic vein at the wrist and the upper arm. The basilic vein also has good caliber in the upper arm.  I have recommended AV fistula placement as soon as possible to shorten his life the need for catheter. We will schedule this for Friday, 05/03/2013 as an outpatient

## 2013-05-03 NOTE — Telephone Encounter (Signed)
Lvm, sent letter - kf

## 2013-05-03 NOTE — Preoperative (Signed)
Beta Blockers   Reason not to administer Beta Blockers:Not Applicable 

## 2013-05-07 ENCOUNTER — Encounter (HOSPITAL_COMMUNITY): Payer: Self-pay | Admitting: Vascular Surgery

## 2013-05-07 DIAGNOSIS — T827XXA Infection and inflammatory reaction due to other cardiac and vascular devices, implants and grafts, initial encounter: Secondary | ICD-10-CM | POA: Diagnosis not present

## 2013-05-07 DIAGNOSIS — N186 End stage renal disease: Secondary | ICD-10-CM | POA: Diagnosis not present

## 2013-05-07 DIAGNOSIS — E119 Type 2 diabetes mellitus without complications: Secondary | ICD-10-CM | POA: Diagnosis not present

## 2013-05-07 DIAGNOSIS — I120 Hypertensive chronic kidney disease with stage 5 chronic kidney disease or end stage renal disease: Secondary | ICD-10-CM | POA: Diagnosis not present

## 2013-05-08 DIAGNOSIS — E119 Type 2 diabetes mellitus without complications: Secondary | ICD-10-CM | POA: Diagnosis not present

## 2013-05-08 DIAGNOSIS — I120 Hypertensive chronic kidney disease with stage 5 chronic kidney disease or end stage renal disease: Secondary | ICD-10-CM | POA: Diagnosis not present

## 2013-05-08 DIAGNOSIS — N186 End stage renal disease: Secondary | ICD-10-CM | POA: Diagnosis not present

## 2013-05-08 DIAGNOSIS — T827XXA Infection and inflammatory reaction due to other cardiac and vascular devices, implants and grafts, initial encounter: Secondary | ICD-10-CM | POA: Diagnosis not present

## 2013-05-09 ENCOUNTER — Ambulatory Visit: Payer: Medicare Other | Admitting: Neurosurgery

## 2013-05-13 DIAGNOSIS — N186 End stage renal disease: Secondary | ICD-10-CM | POA: Diagnosis not present

## 2013-05-13 DIAGNOSIS — I120 Hypertensive chronic kidney disease with stage 5 chronic kidney disease or end stage renal disease: Secondary | ICD-10-CM | POA: Diagnosis not present

## 2013-05-13 DIAGNOSIS — T827XXA Infection and inflammatory reaction due to other cardiac and vascular devices, implants and grafts, initial encounter: Secondary | ICD-10-CM | POA: Diagnosis not present

## 2013-05-13 DIAGNOSIS — E119 Type 2 diabetes mellitus without complications: Secondary | ICD-10-CM | POA: Diagnosis not present

## 2013-05-23 DIAGNOSIS — N186 End stage renal disease: Secondary | ICD-10-CM | POA: Diagnosis not present

## 2013-05-24 DIAGNOSIS — D649 Anemia, unspecified: Secondary | ICD-10-CM | POA: Diagnosis not present

## 2013-05-24 DIAGNOSIS — N2581 Secondary hyperparathyroidism of renal origin: Secondary | ICD-10-CM | POA: Diagnosis not present

## 2013-05-24 DIAGNOSIS — N186 End stage renal disease: Secondary | ICD-10-CM | POA: Diagnosis not present

## 2013-05-24 DIAGNOSIS — D631 Anemia in chronic kidney disease: Secondary | ICD-10-CM | POA: Diagnosis not present

## 2013-05-24 DIAGNOSIS — D509 Iron deficiency anemia, unspecified: Secondary | ICD-10-CM | POA: Diagnosis not present

## 2013-05-31 ENCOUNTER — Other Ambulatory Visit: Payer: Self-pay | Admitting: *Deleted

## 2013-05-31 DIAGNOSIS — N186 End stage renal disease: Secondary | ICD-10-CM

## 2013-05-31 DIAGNOSIS — Z4931 Encounter for adequacy testing for hemodialysis: Secondary | ICD-10-CM

## 2013-06-17 ENCOUNTER — Encounter: Payer: Self-pay | Admitting: Vascular Surgery

## 2013-06-18 ENCOUNTER — Encounter (INDEPENDENT_AMBULATORY_CARE_PROVIDER_SITE_OTHER): Payer: Medicare Other | Admitting: Vascular Surgery

## 2013-06-18 ENCOUNTER — Encounter: Payer: Self-pay | Admitting: Vascular Surgery

## 2013-06-18 ENCOUNTER — Ambulatory Visit (INDEPENDENT_AMBULATORY_CARE_PROVIDER_SITE_OTHER): Payer: Medicare Other | Admitting: Vascular Surgery

## 2013-06-18 VITALS — BP 113/81 | HR 98 | Ht 71.5 in | Wt 326.0 lb

## 2013-06-18 DIAGNOSIS — N186 End stage renal disease: Secondary | ICD-10-CM

## 2013-06-18 DIAGNOSIS — Z4931 Encounter for adequacy testing for hemodialysis: Secondary | ICD-10-CM

## 2013-06-18 DIAGNOSIS — N184 Chronic kidney disease, stage 4 (severe): Secondary | ICD-10-CM | POA: Diagnosis not present

## 2013-06-18 NOTE — Progress Notes (Signed)
Patient presents today for followup of left radiocephalic Cimino AV fistula placed by myself on 05/03/2013. He is completely healed the area of concern regarding his right upper arm fistula.  His left wrist incision is completely healed. He does have a good thrill through the AV fistula. He is obese in his vein does run somewhat deep. Duplex shows multiple large competing branches throughout his forearm fistula. The size of the fistula itself was good but does run quite deep.  Impression and plan maturing left Cimino AV fistula. I recommended superficial mobilization and ligation of competing branches for optimization of his fistula. 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 I feel this is appropriations he will continue to have maturation during this time. His dialysis catheter is working without difficulty. We've tentatively scheduled the surgery for September 24 is an outpatient

## 2013-06-23 DIAGNOSIS — N186 End stage renal disease: Secondary | ICD-10-CM | POA: Diagnosis not present

## 2013-06-24 DIAGNOSIS — N2581 Secondary hyperparathyroidism of renal origin: Secondary | ICD-10-CM | POA: Diagnosis not present

## 2013-06-24 DIAGNOSIS — D509 Iron deficiency anemia, unspecified: Secondary | ICD-10-CM | POA: Diagnosis not present

## 2013-06-24 DIAGNOSIS — E875 Hyperkalemia: Secondary | ICD-10-CM | POA: Diagnosis not present

## 2013-06-24 DIAGNOSIS — N186 End stage renal disease: Secondary | ICD-10-CM | POA: Diagnosis not present

## 2013-06-26 DIAGNOSIS — E875 Hyperkalemia: Secondary | ICD-10-CM | POA: Diagnosis not present

## 2013-06-26 DIAGNOSIS — N2581 Secondary hyperparathyroidism of renal origin: Secondary | ICD-10-CM | POA: Diagnosis not present

## 2013-06-26 DIAGNOSIS — D509 Iron deficiency anemia, unspecified: Secondary | ICD-10-CM | POA: Diagnosis not present

## 2013-06-26 DIAGNOSIS — N186 End stage renal disease: Secondary | ICD-10-CM | POA: Diagnosis not present

## 2013-06-28 ENCOUNTER — Other Ambulatory Visit: Payer: Self-pay | Admitting: *Deleted

## 2013-06-29 DIAGNOSIS — D509 Iron deficiency anemia, unspecified: Secondary | ICD-10-CM | POA: Diagnosis not present

## 2013-06-29 DIAGNOSIS — N186 End stage renal disease: Secondary | ICD-10-CM | POA: Diagnosis not present

## 2013-06-29 DIAGNOSIS — E875 Hyperkalemia: Secondary | ICD-10-CM | POA: Diagnosis not present

## 2013-06-29 DIAGNOSIS — N2581 Secondary hyperparathyroidism of renal origin: Secondary | ICD-10-CM | POA: Diagnosis not present

## 2013-07-02 DIAGNOSIS — N186 End stage renal disease: Secondary | ICD-10-CM | POA: Diagnosis not present

## 2013-07-02 DIAGNOSIS — D509 Iron deficiency anemia, unspecified: Secondary | ICD-10-CM | POA: Diagnosis not present

## 2013-07-02 DIAGNOSIS — E875 Hyperkalemia: Secondary | ICD-10-CM | POA: Diagnosis not present

## 2013-07-02 DIAGNOSIS — N2581 Secondary hyperparathyroidism of renal origin: Secondary | ICD-10-CM | POA: Diagnosis not present

## 2013-07-04 DIAGNOSIS — N2581 Secondary hyperparathyroidism of renal origin: Secondary | ICD-10-CM | POA: Diagnosis not present

## 2013-07-04 DIAGNOSIS — D509 Iron deficiency anemia, unspecified: Secondary | ICD-10-CM | POA: Diagnosis not present

## 2013-07-04 DIAGNOSIS — E875 Hyperkalemia: Secondary | ICD-10-CM | POA: Diagnosis not present

## 2013-07-04 DIAGNOSIS — N186 End stage renal disease: Secondary | ICD-10-CM | POA: Diagnosis not present

## 2013-07-06 DIAGNOSIS — D509 Iron deficiency anemia, unspecified: Secondary | ICD-10-CM | POA: Diagnosis not present

## 2013-07-06 DIAGNOSIS — N186 End stage renal disease: Secondary | ICD-10-CM | POA: Diagnosis not present

## 2013-07-06 DIAGNOSIS — N2581 Secondary hyperparathyroidism of renal origin: Secondary | ICD-10-CM | POA: Diagnosis not present

## 2013-07-06 DIAGNOSIS — E875 Hyperkalemia: Secondary | ICD-10-CM | POA: Diagnosis not present

## 2013-07-08 DIAGNOSIS — D509 Iron deficiency anemia, unspecified: Secondary | ICD-10-CM | POA: Diagnosis not present

## 2013-07-08 DIAGNOSIS — E875 Hyperkalemia: Secondary | ICD-10-CM | POA: Diagnosis not present

## 2013-07-08 DIAGNOSIS — N186 End stage renal disease: Secondary | ICD-10-CM | POA: Diagnosis not present

## 2013-07-08 DIAGNOSIS — N2581 Secondary hyperparathyroidism of renal origin: Secondary | ICD-10-CM | POA: Diagnosis not present

## 2013-07-10 DIAGNOSIS — E875 Hyperkalemia: Secondary | ICD-10-CM | POA: Diagnosis not present

## 2013-07-10 DIAGNOSIS — N186 End stage renal disease: Secondary | ICD-10-CM | POA: Diagnosis not present

## 2013-07-10 DIAGNOSIS — N2581 Secondary hyperparathyroidism of renal origin: Secondary | ICD-10-CM | POA: Diagnosis not present

## 2013-07-10 DIAGNOSIS — D509 Iron deficiency anemia, unspecified: Secondary | ICD-10-CM | POA: Diagnosis not present

## 2013-07-12 DIAGNOSIS — N2581 Secondary hyperparathyroidism of renal origin: Secondary | ICD-10-CM | POA: Diagnosis not present

## 2013-07-12 DIAGNOSIS — N186 End stage renal disease: Secondary | ICD-10-CM | POA: Diagnosis not present

## 2013-07-12 DIAGNOSIS — E875 Hyperkalemia: Secondary | ICD-10-CM | POA: Diagnosis not present

## 2013-07-12 DIAGNOSIS — D509 Iron deficiency anemia, unspecified: Secondary | ICD-10-CM | POA: Diagnosis not present

## 2013-07-15 DIAGNOSIS — N2581 Secondary hyperparathyroidism of renal origin: Secondary | ICD-10-CM | POA: Diagnosis not present

## 2013-07-15 DIAGNOSIS — E875 Hyperkalemia: Secondary | ICD-10-CM | POA: Diagnosis not present

## 2013-07-15 DIAGNOSIS — N186 End stage renal disease: Secondary | ICD-10-CM | POA: Diagnosis not present

## 2013-07-15 DIAGNOSIS — D509 Iron deficiency anemia, unspecified: Secondary | ICD-10-CM | POA: Diagnosis not present

## 2013-07-17 DIAGNOSIS — N186 End stage renal disease: Secondary | ICD-10-CM | POA: Diagnosis not present

## 2013-07-17 DIAGNOSIS — N2581 Secondary hyperparathyroidism of renal origin: Secondary | ICD-10-CM | POA: Diagnosis not present

## 2013-07-17 DIAGNOSIS — D509 Iron deficiency anemia, unspecified: Secondary | ICD-10-CM | POA: Diagnosis not present

## 2013-07-17 DIAGNOSIS — E875 Hyperkalemia: Secondary | ICD-10-CM | POA: Diagnosis not present

## 2013-07-19 ENCOUNTER — Encounter (HOSPITAL_COMMUNITY): Payer: Self-pay | Admitting: Pharmacy Technician

## 2013-07-19 DIAGNOSIS — D509 Iron deficiency anemia, unspecified: Secondary | ICD-10-CM | POA: Diagnosis not present

## 2013-07-19 DIAGNOSIS — E875 Hyperkalemia: Secondary | ICD-10-CM | POA: Diagnosis not present

## 2013-07-19 DIAGNOSIS — N2581 Secondary hyperparathyroidism of renal origin: Secondary | ICD-10-CM | POA: Diagnosis not present

## 2013-07-19 DIAGNOSIS — N186 End stage renal disease: Secondary | ICD-10-CM | POA: Diagnosis not present

## 2013-07-22 DIAGNOSIS — E875 Hyperkalemia: Secondary | ICD-10-CM | POA: Diagnosis not present

## 2013-07-22 DIAGNOSIS — D509 Iron deficiency anemia, unspecified: Secondary | ICD-10-CM | POA: Diagnosis not present

## 2013-07-22 DIAGNOSIS — N186 End stage renal disease: Secondary | ICD-10-CM | POA: Diagnosis not present

## 2013-07-22 DIAGNOSIS — N2581 Secondary hyperparathyroidism of renal origin: Secondary | ICD-10-CM | POA: Diagnosis not present

## 2013-07-23 ENCOUNTER — Encounter (HOSPITAL_COMMUNITY): Payer: Self-pay | Admitting: *Deleted

## 2013-07-23 DIAGNOSIS — N2581 Secondary hyperparathyroidism of renal origin: Secondary | ICD-10-CM | POA: Diagnosis not present

## 2013-07-23 DIAGNOSIS — D509 Iron deficiency anemia, unspecified: Secondary | ICD-10-CM | POA: Diagnosis not present

## 2013-07-23 DIAGNOSIS — E875 Hyperkalemia: Secondary | ICD-10-CM | POA: Diagnosis not present

## 2013-07-23 DIAGNOSIS — N186 End stage renal disease: Secondary | ICD-10-CM | POA: Diagnosis not present

## 2013-07-23 MED ORDER — DEXTROSE 5 % IV SOLN
1.5000 g | INTRAVENOUS | Status: AC
  Administered 2013-07-24: 1.5 g via INTRAVENOUS
  Filled 2013-07-23: qty 1.5

## 2013-07-23 NOTE — Progress Notes (Signed)
Pt denies SOB, chest pain, and being under the care of a cardiologist. Pt states that he had a stress test in 2006 or 2006 at [facility] but denies having any other cardiac studies.

## 2013-07-24 ENCOUNTER — Ambulatory Visit (HOSPITAL_COMMUNITY)
Admission: RE | Admit: 2013-07-24 | Discharge: 2013-07-24 | Disposition: A | Discharge status: Home / Self Care | Payer: Medicare Other | Source: Ambulatory Visit | Attending: Vascular Surgery | Admitting: Vascular Surgery

## 2013-07-24 ENCOUNTER — Encounter (HOSPITAL_COMMUNITY): Payer: Self-pay | Admitting: *Deleted

## 2013-07-24 ENCOUNTER — Ambulatory Visit (HOSPITAL_COMMUNITY): Payer: Medicare Other | Admitting: Certified Registered"

## 2013-07-24 ENCOUNTER — Encounter (HOSPITAL_COMMUNITY)
Admission: RE | Disposition: A | Discharge status: Home / Self Care | Payer: Self-pay | Source: Ambulatory Visit | Attending: Vascular Surgery

## 2013-07-24 ENCOUNTER — Encounter (HOSPITAL_COMMUNITY): Payer: Self-pay | Admitting: Certified Registered"

## 2013-07-24 ENCOUNTER — Telehealth: Payer: Self-pay | Admitting: Vascular Surgery

## 2013-07-24 DIAGNOSIS — I10 Essential (primary) hypertension: Secondary | ICD-10-CM | POA: Diagnosis not present

## 2013-07-24 DIAGNOSIS — T82898A Other specified complication of vascular prosthetic devices, implants and grafts, initial encounter: Secondary | ICD-10-CM | POA: Insufficient documentation

## 2013-07-24 DIAGNOSIS — I120 Hypertensive chronic kidney disease with stage 5 chronic kidney disease or end stage renal disease: Secondary | ICD-10-CM | POA: Diagnosis not present

## 2013-07-24 DIAGNOSIS — Z992 Dependence on renal dialysis: Secondary | ICD-10-CM | POA: Diagnosis not present

## 2013-07-24 DIAGNOSIS — K219 Gastro-esophageal reflux disease without esophagitis: Secondary | ICD-10-CM | POA: Diagnosis not present

## 2013-07-24 DIAGNOSIS — N186 End stage renal disease: Secondary | ICD-10-CM | POA: Insufficient documentation

## 2013-07-24 DIAGNOSIS — Z79899 Other long term (current) drug therapy: Secondary | ICD-10-CM | POA: Diagnosis not present

## 2013-07-24 DIAGNOSIS — Y832 Surgical operation with anastomosis, bypass or graft as the cause of abnormal reaction of the patient, or of later complication, without mention of misadventure at the time of the procedure: Secondary | ICD-10-CM | POA: Insufficient documentation

## 2013-07-24 HISTORY — PX: FISTULA SUPERFICIALIZATION: SHX6341

## 2013-07-24 LAB — POCT I-STAT 4, (NA,K, GLUC, HGB,HCT)
HCT: 46 % (ref 39.0–52.0)
Hemoglobin: 15.6 g/dL (ref 13.0–17.0)
Potassium: 5.3 mEq/L — ABNORMAL HIGH (ref 3.5–5.1)
Sodium: 137 mEq/L (ref 135–145)

## 2013-07-24 SURGERY — FISTULA SUPERFICIALIZATION
Anesthesia: General | Site: Arm Lower | Laterality: Left | Wound class: Clean

## 2013-07-24 MED ORDER — SUFENTANIL CITRATE 50 MCG/ML IV SOLN
INTRAVENOUS | Status: DC | PRN
  Administered 2013-07-24 (×2): 10 ug via INTRAVENOUS
  Administered 2013-07-24: 20 ug via INTRAVENOUS
  Administered 2013-07-24: 10 ug via INTRAVENOUS

## 2013-07-24 MED ORDER — HYDROMORPHONE HCL PF 1 MG/ML IJ SOLN
INTRAMUSCULAR | Status: AC
  Filled 2013-07-24: qty 1

## 2013-07-24 MED ORDER — OXYCODONE HCL 5 MG PO TABS: 5.0000 mg | ORAL_TABLET | Freq: Once | ORAL | Status: DC | PRN

## 2013-07-24 MED ORDER — SODIUM CHLORIDE 0.9 % IR SOLN
Status: DC | PRN
  Administered 2013-07-24: 10:00:00

## 2013-07-24 MED ORDER — OXYCODONE HCL 5 MG PO TABS: 5.0000 mg | ORAL_TABLET | Freq: Four times a day (QID) | ORAL | Status: DC | PRN

## 2013-07-24 MED ORDER — ALBUMIN HUMAN 5 % IV SOLN
INTRAVENOUS | Status: DC | PRN
  Administered 2013-07-24: 10:00:00 via INTRAVENOUS

## 2013-07-24 MED ORDER — MIDAZOLAM HCL 5 MG/5ML IJ SOLN
INTRAMUSCULAR | Status: DC | PRN
  Administered 2013-07-24: 2 mg via INTRAVENOUS

## 2013-07-24 MED ORDER — SODIUM POLYSTYRENE SULFONATE 15 GM/60ML PO SUSP
15.0000 g | Freq: Once | ORAL | Status: DC
  Filled 2013-07-24 (×2): qty 60

## 2013-07-24 MED ORDER — MEPERIDINE HCL 25 MG/ML IJ SOLN: 6.2500 mg | INTRAMUSCULAR | Status: DC | PRN

## 2013-07-24 MED ORDER — OXYCODONE HCL 5 MG/5ML PO SOLN: 5.0000 mg | Freq: Once | ORAL | Status: DC | PRN

## 2013-07-24 MED ORDER — SODIUM CHLORIDE 0.9 % IV SOLN
INTRAVENOUS | Status: DC
  Administered 2013-07-24 (×2): via INTRAVENOUS

## 2013-07-24 MED ORDER — HYDROMORPHONE HCL PF 1 MG/ML IJ SOLN
0.2500 mg | INTRAMUSCULAR | Status: DC | PRN
  Administered 2013-07-24 (×4): 0.5 mg via INTRAVENOUS

## 2013-07-24 MED ORDER — ONDANSETRON HCL 4 MG/2ML IJ SOLN: 4.0000 mg | Freq: Once | INTRAMUSCULAR | Status: DC | PRN

## 2013-07-24 MED ORDER — PHENYLEPHRINE HCL 10 MG/ML IJ SOLN
10.0000 mg | INTRAVENOUS | Status: DC | PRN
  Administered 2013-07-24: 75 ug/min via INTRAVENOUS

## 2013-07-24 MED ORDER — PROPOFOL 10 MG/ML IV BOLUS
INTRAVENOUS | Status: DC | PRN
  Administered 2013-07-24: 100 mg via INTRAVENOUS
  Administered 2013-07-24: 200 mg via INTRAVENOUS
  Administered 2013-07-24: 50 mg via INTRAVENOUS

## 2013-07-24 MED ORDER — PHENYLEPHRINE HCL 10 MG/ML IJ SOLN
INTRAMUSCULAR | Status: DC | PRN
  Administered 2013-07-24 (×4): 80 ug via INTRAVENOUS

## 2013-07-24 MED ORDER — 0.9 % SODIUM CHLORIDE (POUR BTL) OPTIME
TOPICAL | Status: DC | PRN
  Administered 2013-07-24: 1000 mL

## 2013-07-24 MED ORDER — GLYCOPYRROLATE 0.2 MG/ML IJ SOLN
INTRAMUSCULAR | Status: DC | PRN
  Administered 2013-07-24: 0.2 mg via INTRAVENOUS

## 2013-07-24 SURGICAL SUPPLY — 33 items
BANDAGE ELASTIC 4 VELCRO ST LF (GAUZE/BANDAGES/DRESSINGS) ×2 IMPLANT
BENZOIN TINCTURE PRP APPL 2/3 (GAUZE/BANDAGES/DRESSINGS) ×2 IMPLANT
CANISTER SUCTION 2500CC (MISCELLANEOUS) ×2 IMPLANT
CLIP LIGATING EXTRA MED SLVR (CLIP) ×2 IMPLANT
CLIP LIGATING EXTRA SM BLUE (MISCELLANEOUS) ×2 IMPLANT
CLSR STERI-STRIP ANTIMIC 1/2X4 (GAUZE/BANDAGES/DRESSINGS) ×2 IMPLANT
COVER PROBE W GEL 5X96 (DRAPES) ×2 IMPLANT
COVER SURGICAL LIGHT HANDLE (MISCELLANEOUS) ×2 IMPLANT
DECANTER SPIKE VIAL GLASS SM (MISCELLANEOUS) ×2 IMPLANT
ELECT REM PT RETURN 9FT ADLT (ELECTROSURGICAL) ×2
ELECTRODE REM PT RTRN 9FT ADLT (ELECTROSURGICAL) ×1 IMPLANT
GEL ULTRASOUND 20GR AQUASONIC (MISCELLANEOUS) IMPLANT
GLOVE BIOGEL M 6.5 STRL (GLOVE) ×4 IMPLANT
GLOVE BIOGEL PI IND STRL 6.5 (GLOVE) ×2 IMPLANT
GLOVE BIOGEL PI INDICATOR 6.5 (GLOVE) ×2
GLOVE SS BIOGEL STRL SZ 7.5 (GLOVE) ×1 IMPLANT
GLOVE SUPERSENSE BIOGEL SZ 7.5 (GLOVE) ×1
GOWN STRL NON-REIN LRG LVL3 (GOWN DISPOSABLE) ×6 IMPLANT
KIT BASIN OR (CUSTOM PROCEDURE TRAY) ×2 IMPLANT
KIT ROOM TURNOVER OR (KITS) ×2 IMPLANT
NS IRRIG 1000ML POUR BTL (IV SOLUTION) ×2 IMPLANT
PACK CV ACCESS (CUSTOM PROCEDURE TRAY) ×2 IMPLANT
PAD ARMBOARD 7.5X6 YLW CONV (MISCELLANEOUS) ×4 IMPLANT
SPONGE GAUZE 4X4 12PLY (GAUZE/BANDAGES/DRESSINGS) ×2 IMPLANT
STRIP CLOSURE SKIN 1/2X4 (GAUZE/BANDAGES/DRESSINGS) ×2 IMPLANT
SUT PROLENE 6 0 CC (SUTURE) ×2 IMPLANT
SUT VIC AB 3-0 SH 27 (SUTURE) ×2
SUT VIC AB 3-0 SH 27X BRD (SUTURE) ×2 IMPLANT
TAPE CLOTH SURG 4X10 WHT LF (GAUZE/BANDAGES/DRESSINGS) ×2 IMPLANT
TOWEL OR 17X24 6PK STRL BLUE (TOWEL DISPOSABLE) ×2 IMPLANT
TOWEL OR 17X26 10 PK STRL BLUE (TOWEL DISPOSABLE) ×2 IMPLANT
UNDERPAD 30X30 INCONTINENT (UNDERPADS AND DIAPERS) ×2 IMPLANT
WATER STERILE IRR 1000ML POUR (IV SOLUTION) ×2 IMPLANT

## 2013-07-24 NOTE — Transfer of Care (Signed)
Immediate Anesthesia Transfer of Care Note  Patient: [name]  Procedure(s) Performed: Procedure(s): FISTULA SUPERFICIALIZATION AND LIGATION OF BRANCHES (Left)  Patient Location: PACU  Anesthesia Type:General  Level of Consciousness: sedated, patient cooperative and responds to stimulation  Airway & Oxygen Therapy: Patient Spontanous Breathing and Patient connected to nasal cannula oxygen  Post-op Assessment: Report given to PACU RN, Post -op Vital signs reviewed and stable and Patient moving all extremities X 4  Post vital signs: Reviewed and stable  Complications: No apparent anesthesia complications

## 2013-07-24 NOTE — Anesthesia Preprocedure Evaluation (Addendum)
Anesthesia Evaluation  Patient identified by MRN, date of birth, ID band Patient awake    Reviewed: Allergy & Precautions, H&P , NPO status , Patient's Chart, lab work & pertinent test results  Airway Mallampati: II TM Distance: >3 FB Neck ROM: full    Dental  (+) Teeth Intact   Pulmonary neg pulmonary ROS,          Cardiovascular hypertension, On Medications     Neuro/Psych negative neurological ROS  negative psych ROS   GI/Hepatic Neg liver ROS, GERD-  Medicated and Controlled,  Endo/Other  negative endocrine ROS  Renal/GU ESRF and Dialysis  negative genitourinary   Musculoskeletal   Abdominal   Peds  Hematology  (+) Sickle cell trait ,   Anesthesia Other Findings   Reproductive/Obstetrics                          Anesthesia Physical Anesthesia Plan  ASA: III  Anesthesia Plan: General ETT and General   Post-op Pain Management:    Induction: Intravenous  Airway Management Planned: LMA  Additional Equipment:   Intra-op Plan:   Post-operative Plan: Extubation in OR  Informed Consent: I have reviewed the patients History and Physical, chart, labs and discussed the procedure including the risks, benefits and alternatives for the proposed anesthesia with the patient or authorized representative who has indicated his/her understanding and acceptance.   Dental Advisory Given  Plan Discussed with: Surgeon and CRNA  Anesthesia Plan Comments:        Anesthesia Quick Evaluation

## 2013-07-24 NOTE — H&P (Signed)
           [name] Description: 42 year old male  06/18/2013 4:00 PM Office Visit Provider: Rosetta Posner, MD  MRN: [id number] Department: [facility]            Diagnoses Reason for Visit   End stage renal disease - Primary  Re-evaluation   585.6  6 wk f/u s/p left wrist Cimino AVF 05/03/2013           Current Vitals - Last Recorded    BP Pulse Ht Wt BMI SpO2   113/81 98 5' 11.5" (1.816 m) 326 lb (147.873 kg) 44.84 kg/m2 98%       Vitals History Recorded            Progress Notes    Rosetta Posner, MD at 06/18/2013 4:42 PM    Status: Signed             Patient presents today for followup of left radiocephalic Cimino AV fistula placed by myself on 05/03/2013. He is completely healed the area of concern regarding his right upper arm fistula.  His left wrist incision is completely healed. He does have a good thrill through the AV fistula. He is obese in his vein does run somewhat deep. Duplex shows multiple large competing branches throughout his forearm fistula. The size of the fistula itself was good but does run quite deep.  Impression and plan maturing left Cimino AV fistula. I recommended superficial mobilization and ligation of competing branches for optimization of his fistula. 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 I feel this is appropriations he will continue to have maturation during this time. His dialysis catheter is working without difficulty. We've tentatively scheduled the surgery for September 24 is an outpatient          Encounter-Level Documents:    Electronic signature on 06/18/2013 3:16 PM          Not recorded                            Addendum:  The patient has been re-examined and re-evaluated.  The patient's history and physical has been reviewed and is unchanged.    [name] is a 42 y.o. male is being admitted with ESRD;COMPETING BRANCHES AVF.  All the risks, benefits and other treatment options have been discussed with the patient. The patient has consented to proceed with Procedure(s): FISTULA SUPERFICIALIZATION AND LIGATION BRANCHES as a surgical intervention.  Andria Head 07/24/2013 8:12 AM [facility]

## 2013-07-24 NOTE — Telephone Encounter (Signed)
LVM re appt information, sent letter - kf

## 2013-07-24 NOTE — Progress Notes (Signed)
Had IVF infusing via blue port of RT IJ [place].   Stopped fluids, obtained 1cc blood return, flushed 10cc NS then instilled 2.6cc Heparin (1,000units/cc) into blue port.    Capped.   Taped clamps and caps closed.

## 2013-07-24 NOTE — Progress Notes (Signed)
Iv team capped off HD cath

## 2013-07-24 NOTE — Op Note (Signed)
OPERATIVE REPORT  DATE OF SURGERY: 07/24/2013  PATIENT: [name], 42 y.o. male MRN: [id number]  DOB: [birth year]  PRE-OPERATIVE DIAGNOSIS: End-stage renal disease with left Cimino fistula with multiple side branches and also the course runs deep  POST-OPERATIVE DIAGNOSIS:  Same  PROCEDURE: Superficial mobilization and ligation of competing branches left AV fistula  SURGEON:  Curt Jews, M.D.  PHYSICIAN ASSISTANT: RHYNE  ANESTHESIA:  Gen.  EBL: Normal ml  Total I/O In: 1050 [I.V.:800; IV Piggyback:250] Out: -   BLOOD ADMINISTERED: None  DRAINS: None  SPECIMEN: None  COUNTS CORRECT:  YES  PLAN OF CARE: PACU   PATIENT DISPOSITION:  PACU - hemodynamically stable  PROCEDURE DETAILS: The patient taken up and placed supine position where the left arm continues to fashion. As was made over the distal forearm over the fistula. The fistula was imaged with SonoSite ultrasound and branches were marked. A separate incision was made in the forearm just below the antecubital space. Through these 2 incisions the cephalic vein was mobilized and the subcutaneous fat was resected. This was done under the level of the tunnel connecting these 2 as well. The wound irrigated with saline hemostasis daily cautery. Side branches were ligated with 2 and 3-0 silk ties and divided. The incisions were closed with 3-0 Vicryl in the subcuticular tissue. Sterile dressing and Ace wrap were applied. The patient was transferred to the recovery room in stable condition   Curt Jews, M.D. 07/24/2013 11:21 AM

## 2013-07-24 NOTE — Progress Notes (Signed)
[name] ordered kayexalate 15 gm po for pt, and stated pt to return to hd center on friday

## 2013-07-24 NOTE — Anesthesia Postprocedure Evaluation (Signed)
Anesthesia Post Note  Patient: [name]  Procedure(s) Performed: Procedure(s) (LRB): FISTULA SUPERFICIALIZATION AND LIGATION OF BRANCHES (Left)  Anesthesia type: general  Patient location: PACU  Post pain: Pain level controlled  Post assessment: Patient's Cardiovascular Status Stable  Last Vitals:  Filed Vitals:   07/24/13 1230  BP: 96/60  Pulse: 100  Temp:   Resp: 19    Post vital signs: Reviewed and stable  Level of consciousness: sedated  Complications: No apparent anesthesia complications

## 2013-07-24 NOTE — Telephone Encounter (Signed)
Message copied by Berniece Salines on Wed Jul 24, 2013 11:43 AM ------      Message from: Peter Minium K      Created: Wed Jul 24, 2013 10:45 AM      Regarding: schedule                   ----- Message -----         From: Gabriel Earing, PA-C         Sent: 07/24/2013  10:28 AM           To: Mena Goes, CMA            S/p left arm AVF superficialization and branch ligation 07/24/13.  F/u with Dr. Donnetta Hutching in 4 weeks.  He does not need a duplex.            Thanks,      Aldona Bar ------

## 2013-07-26 ENCOUNTER — Encounter (HOSPITAL_COMMUNITY): Payer: Self-pay | Admitting: Vascular Surgery

## 2013-07-26 DIAGNOSIS — Z23 Encounter for immunization: Secondary | ICD-10-CM | POA: Diagnosis not present

## 2013-07-26 DIAGNOSIS — N186 End stage renal disease: Secondary | ICD-10-CM | POA: Diagnosis not present

## 2013-07-26 DIAGNOSIS — E875 Hyperkalemia: Secondary | ICD-10-CM | POA: Diagnosis not present

## 2013-07-26 DIAGNOSIS — E8779 Other fluid overload: Secondary | ICD-10-CM | POA: Diagnosis not present

## 2013-07-26 DIAGNOSIS — N2581 Secondary hyperparathyroidism of renal origin: Secondary | ICD-10-CM | POA: Diagnosis not present

## 2013-07-29 DIAGNOSIS — Z23 Encounter for immunization: Secondary | ICD-10-CM | POA: Diagnosis not present

## 2013-07-29 DIAGNOSIS — N2581 Secondary hyperparathyroidism of renal origin: Secondary | ICD-10-CM | POA: Diagnosis not present

## 2013-07-29 DIAGNOSIS — N186 End stage renal disease: Secondary | ICD-10-CM | POA: Diagnosis not present

## 2013-07-29 DIAGNOSIS — E875 Hyperkalemia: Secondary | ICD-10-CM | POA: Diagnosis not present

## 2013-07-29 DIAGNOSIS — E8779 Other fluid overload: Secondary | ICD-10-CM | POA: Diagnosis not present

## 2013-07-30 DIAGNOSIS — N186 End stage renal disease: Secondary | ICD-10-CM | POA: Diagnosis not present

## 2013-07-30 DIAGNOSIS — Z23 Encounter for immunization: Secondary | ICD-10-CM | POA: Diagnosis not present

## 2013-07-30 DIAGNOSIS — E875 Hyperkalemia: Secondary | ICD-10-CM | POA: Diagnosis not present

## 2013-07-30 DIAGNOSIS — E8779 Other fluid overload: Secondary | ICD-10-CM | POA: Diagnosis not present

## 2013-07-30 DIAGNOSIS — N2581 Secondary hyperparathyroidism of renal origin: Secondary | ICD-10-CM | POA: Diagnosis not present

## 2013-07-31 DIAGNOSIS — N2581 Secondary hyperparathyroidism of renal origin: Secondary | ICD-10-CM | POA: Diagnosis not present

## 2013-07-31 DIAGNOSIS — E8779 Other fluid overload: Secondary | ICD-10-CM | POA: Diagnosis not present

## 2013-07-31 DIAGNOSIS — N186 End stage renal disease: Secondary | ICD-10-CM | POA: Diagnosis not present

## 2013-07-31 DIAGNOSIS — E875 Hyperkalemia: Secondary | ICD-10-CM | POA: Diagnosis not present

## 2013-07-31 DIAGNOSIS — Z23 Encounter for immunization: Secondary | ICD-10-CM | POA: Diagnosis not present

## 2013-08-02 DIAGNOSIS — N186 End stage renal disease: Secondary | ICD-10-CM | POA: Diagnosis not present

## 2013-08-02 DIAGNOSIS — N2581 Secondary hyperparathyroidism of renal origin: Secondary | ICD-10-CM | POA: Diagnosis not present

## 2013-08-02 DIAGNOSIS — E8779 Other fluid overload: Secondary | ICD-10-CM | POA: Diagnosis not present

## 2013-08-02 DIAGNOSIS — E875 Hyperkalemia: Secondary | ICD-10-CM | POA: Diagnosis not present

## 2013-08-02 DIAGNOSIS — Z23 Encounter for immunization: Secondary | ICD-10-CM | POA: Diagnosis not present

## 2013-08-05 DIAGNOSIS — Z23 Encounter for immunization: Secondary | ICD-10-CM | POA: Diagnosis not present

## 2013-08-05 DIAGNOSIS — N2581 Secondary hyperparathyroidism of renal origin: Secondary | ICD-10-CM | POA: Diagnosis not present

## 2013-08-05 DIAGNOSIS — E8779 Other fluid overload: Secondary | ICD-10-CM | POA: Diagnosis not present

## 2013-08-05 DIAGNOSIS — N186 End stage renal disease: Secondary | ICD-10-CM | POA: Diagnosis not present

## 2013-08-05 DIAGNOSIS — E875 Hyperkalemia: Secondary | ICD-10-CM | POA: Diagnosis not present

## 2013-08-07 DIAGNOSIS — Z23 Encounter for immunization: Secondary | ICD-10-CM | POA: Diagnosis not present

## 2013-08-07 DIAGNOSIS — E875 Hyperkalemia: Secondary | ICD-10-CM | POA: Diagnosis not present

## 2013-08-07 DIAGNOSIS — N186 End stage renal disease: Secondary | ICD-10-CM | POA: Diagnosis not present

## 2013-08-07 DIAGNOSIS — N2581 Secondary hyperparathyroidism of renal origin: Secondary | ICD-10-CM | POA: Diagnosis not present

## 2013-08-07 DIAGNOSIS — E8779 Other fluid overload: Secondary | ICD-10-CM | POA: Diagnosis not present

## 2013-08-09 DIAGNOSIS — Z23 Encounter for immunization: Secondary | ICD-10-CM | POA: Diagnosis not present

## 2013-08-09 DIAGNOSIS — E875 Hyperkalemia: Secondary | ICD-10-CM | POA: Diagnosis not present

## 2013-08-09 DIAGNOSIS — E8779 Other fluid overload: Secondary | ICD-10-CM | POA: Diagnosis not present

## 2013-08-09 DIAGNOSIS — N2581 Secondary hyperparathyroidism of renal origin: Secondary | ICD-10-CM | POA: Diagnosis not present

## 2013-08-09 DIAGNOSIS — N186 End stage renal disease: Secondary | ICD-10-CM | POA: Diagnosis not present

## 2013-08-12 DIAGNOSIS — Z23 Encounter for immunization: Secondary | ICD-10-CM | POA: Diagnosis not present

## 2013-08-12 DIAGNOSIS — N2581 Secondary hyperparathyroidism of renal origin: Secondary | ICD-10-CM | POA: Diagnosis not present

## 2013-08-12 DIAGNOSIS — N186 End stage renal disease: Secondary | ICD-10-CM | POA: Diagnosis not present

## 2013-08-12 DIAGNOSIS — E8779 Other fluid overload: Secondary | ICD-10-CM | POA: Diagnosis not present

## 2013-08-12 DIAGNOSIS — E875 Hyperkalemia: Secondary | ICD-10-CM | POA: Diagnosis not present

## 2013-08-13 DIAGNOSIS — E8779 Other fluid overload: Secondary | ICD-10-CM | POA: Diagnosis not present

## 2013-08-13 DIAGNOSIS — N186 End stage renal disease: Secondary | ICD-10-CM | POA: Diagnosis not present

## 2013-08-13 DIAGNOSIS — N2581 Secondary hyperparathyroidism of renal origin: Secondary | ICD-10-CM | POA: Diagnosis not present

## 2013-08-13 DIAGNOSIS — E875 Hyperkalemia: Secondary | ICD-10-CM | POA: Diagnosis not present

## 2013-08-13 DIAGNOSIS — Z23 Encounter for immunization: Secondary | ICD-10-CM | POA: Diagnosis not present

## 2013-08-14 DIAGNOSIS — E875 Hyperkalemia: Secondary | ICD-10-CM | POA: Diagnosis not present

## 2013-08-14 DIAGNOSIS — Z23 Encounter for immunization: Secondary | ICD-10-CM | POA: Diagnosis not present

## 2013-08-14 DIAGNOSIS — E8779 Other fluid overload: Secondary | ICD-10-CM | POA: Diagnosis not present

## 2013-08-14 DIAGNOSIS — N2581 Secondary hyperparathyroidism of renal origin: Secondary | ICD-10-CM | POA: Diagnosis not present

## 2013-08-14 DIAGNOSIS — N186 End stage renal disease: Secondary | ICD-10-CM | POA: Diagnosis not present

## 2013-08-16 DIAGNOSIS — E875 Hyperkalemia: Secondary | ICD-10-CM | POA: Diagnosis not present

## 2013-08-16 DIAGNOSIS — N186 End stage renal disease: Secondary | ICD-10-CM | POA: Diagnosis not present

## 2013-08-16 DIAGNOSIS — N2581 Secondary hyperparathyroidism of renal origin: Secondary | ICD-10-CM | POA: Diagnosis not present

## 2013-08-16 DIAGNOSIS — E8779 Other fluid overload: Secondary | ICD-10-CM | POA: Diagnosis not present

## 2013-08-16 DIAGNOSIS — Z23 Encounter for immunization: Secondary | ICD-10-CM | POA: Diagnosis not present

## 2013-08-19 ENCOUNTER — Encounter: Payer: Self-pay | Admitting: Vascular Surgery

## 2013-08-19 DIAGNOSIS — E8779 Other fluid overload: Secondary | ICD-10-CM | POA: Diagnosis not present

## 2013-08-19 DIAGNOSIS — Z23 Encounter for immunization: Secondary | ICD-10-CM | POA: Diagnosis not present

## 2013-08-19 DIAGNOSIS — N186 End stage renal disease: Secondary | ICD-10-CM | POA: Diagnosis not present

## 2013-08-19 DIAGNOSIS — E875 Hyperkalemia: Secondary | ICD-10-CM | POA: Diagnosis not present

## 2013-08-19 DIAGNOSIS — N2581 Secondary hyperparathyroidism of renal origin: Secondary | ICD-10-CM | POA: Diagnosis not present

## 2013-08-20 ENCOUNTER — Encounter (INDEPENDENT_AMBULATORY_CARE_PROVIDER_SITE_OTHER): Payer: Self-pay

## 2013-08-20 ENCOUNTER — Encounter: Payer: Self-pay | Admitting: Vascular Surgery

## 2013-08-20 ENCOUNTER — Encounter: Payer: Medicare Other | Admitting: Vascular Surgery

## 2013-08-20 ENCOUNTER — Ambulatory Visit (INDEPENDENT_AMBULATORY_CARE_PROVIDER_SITE_OTHER): Payer: Self-pay | Admitting: Vascular Surgery

## 2013-08-20 VITALS — BP 109/70 | HR 101 | Resp 18 | Ht 71.0 in | Wt 327.0 lb

## 2013-08-20 DIAGNOSIS — N186 End stage renal disease: Secondary | ICD-10-CM

## 2013-08-20 NOTE — Progress Notes (Signed)
The patient has today for followup of superficial mobilization and ligation of competing branches of his left Cimino AV fistula on 07/24/2013. He's had complete healing of the incisions. He does have a good thrill and good vein size. There is a significant amount of thickening of the incisions. I did imaged this with SonoSite ultrasound and he does have good size vein. He does still run somewhat under the surface of the skin do to M.D. in 4 weeks out from surgery. I feel that he is a good chance that this will eventually become usable. He is using a catheter currently for dialysis without difficulty. As long as his catheter is working well I would route weight 1 additional month for healing and then would begin access his fistula. One imaging his cephalic vein above the elbow he does have a good caliber cephalic vein extending in the upper arm. This is deepened below the fat. We will see him again on an as-needed basis if he has difficulty after he begins access in his left Cimino fistula in one month

## 2013-08-21 DIAGNOSIS — N2581 Secondary hyperparathyroidism of renal origin: Secondary | ICD-10-CM | POA: Diagnosis not present

## 2013-08-21 DIAGNOSIS — Z23 Encounter for immunization: Secondary | ICD-10-CM | POA: Diagnosis not present

## 2013-08-21 DIAGNOSIS — E8779 Other fluid overload: Secondary | ICD-10-CM | POA: Diagnosis not present

## 2013-08-21 DIAGNOSIS — N186 End stage renal disease: Secondary | ICD-10-CM | POA: Diagnosis not present

## 2013-08-21 DIAGNOSIS — E875 Hyperkalemia: Secondary | ICD-10-CM | POA: Diagnosis not present

## 2013-08-23 DIAGNOSIS — N186 End stage renal disease: Secondary | ICD-10-CM | POA: Diagnosis not present

## 2013-08-23 DIAGNOSIS — N2581 Secondary hyperparathyroidism of renal origin: Secondary | ICD-10-CM | POA: Diagnosis not present

## 2013-08-23 DIAGNOSIS — Z23 Encounter for immunization: Secondary | ICD-10-CM | POA: Diagnosis not present

## 2013-08-23 DIAGNOSIS — T82898A Other specified complication of vascular prosthetic devices, implants and grafts, initial encounter: Secondary | ICD-10-CM | POA: Diagnosis not present

## 2013-08-23 DIAGNOSIS — E875 Hyperkalemia: Secondary | ICD-10-CM | POA: Diagnosis not present

## 2013-08-23 DIAGNOSIS — E8779 Other fluid overload: Secondary | ICD-10-CM | POA: Diagnosis not present

## 2013-08-26 ENCOUNTER — Encounter: Payer: Self-pay | Admitting: Vascular Surgery

## 2013-08-26 DIAGNOSIS — N2581 Secondary hyperparathyroidism of renal origin: Secondary | ICD-10-CM | POA: Diagnosis not present

## 2013-08-26 DIAGNOSIS — R5081 Fever presenting with conditions classified elsewhere: Secondary | ICD-10-CM | POA: Diagnosis not present

## 2013-08-26 DIAGNOSIS — E875 Hyperkalemia: Secondary | ICD-10-CM | POA: Diagnosis not present

## 2013-08-26 DIAGNOSIS — R7881 Bacteremia: Secondary | ICD-10-CM | POA: Diagnosis not present

## 2013-08-26 DIAGNOSIS — N186 End stage renal disease: Secondary | ICD-10-CM | POA: Diagnosis not present

## 2013-08-27 ENCOUNTER — Encounter: Payer: Self-pay | Admitting: Vascular Surgery

## 2013-08-27 ENCOUNTER — Ambulatory Visit (INDEPENDENT_AMBULATORY_CARE_PROVIDER_SITE_OTHER): Payer: BC Managed Care – PPO | Admitting: Vascular Surgery

## 2013-08-27 VITALS — BP 107/73 | HR 102 | Temp 98.1°F | Ht 71.0 in | Wt 333.3 lb

## 2013-08-27 DIAGNOSIS — N186 End stage renal disease: Secondary | ICD-10-CM

## 2013-08-27 DIAGNOSIS — T82898A Other specified complication of vascular prosthetic devices, implants and grafts, initial encounter: Secondary | ICD-10-CM

## 2013-08-27 NOTE — Progress Notes (Signed)
Patient ID: [name], male   DOB: 1971-10-10, 42 y.o.   MRN: [id number] Patient presents today for concern regarding a area of drainage in his mid forearm. He was seen one week ago with a nice healing and resolution. He has had little interruption of a stitch abscess on the mid incision in his forearm. I did review this in the office and there is no evidence of loculation or fluctuance under this. He will continue his oral antibiotics as was started in hemodialysis. There is no prosthetic material below this and have reassured him. We will plan to clean this when the veins and showers and keep it covered with a Band-Aid. Should be able to use his fistula in approximately one month

## 2013-08-29 ENCOUNTER — Other Ambulatory Visit: Payer: Self-pay

## 2013-09-09 ENCOUNTER — Emergency Department (HOSPITAL_COMMUNITY): Payer: Medicare Other

## 2013-09-09 ENCOUNTER — Inpatient Hospital Stay (HOSPITAL_COMMUNITY)
Admission: EM | Admit: 2013-09-09 | Discharge: 2013-09-14 | DRG: 314 | Disposition: A | Discharge status: Home / Self Care | Payer: Medicare Other | Attending: Internal Medicine | Admitting: Internal Medicine

## 2013-09-09 ENCOUNTER — Encounter (HOSPITAL_COMMUNITY): Payer: Self-pay | Admitting: Emergency Medicine

## 2013-09-09 DIAGNOSIS — N186 End stage renal disease: Secondary | ICD-10-CM | POA: Diagnosis present

## 2013-09-09 DIAGNOSIS — A419 Sepsis, unspecified organism: Secondary | ICD-10-CM | POA: Diagnosis not present

## 2013-09-09 DIAGNOSIS — Z8249 Family history of ischemic heart disease and other diseases of the circulatory system: Secondary | ICD-10-CM

## 2013-09-09 DIAGNOSIS — M899 Disorder of bone, unspecified: Secondary | ICD-10-CM | POA: Diagnosis present

## 2013-09-09 DIAGNOSIS — F17200 Nicotine dependence, unspecified, uncomplicated: Secondary | ICD-10-CM | POA: Diagnosis present

## 2013-09-09 DIAGNOSIS — T827XXA Infection and inflammatory reaction due to other cardiac and vascular devices, implants and grafts, initial encounter: Principal | ICD-10-CM | POA: Diagnosis present

## 2013-09-09 DIAGNOSIS — R7881 Bacteremia: Secondary | ICD-10-CM

## 2013-09-09 DIAGNOSIS — F1020 Alcohol dependence, uncomplicated: Secondary | ICD-10-CM | POA: Diagnosis present

## 2013-09-09 DIAGNOSIS — R197 Diarrhea, unspecified: Secondary | ICD-10-CM | POA: Diagnosis present

## 2013-09-09 DIAGNOSIS — D638 Anemia in other chronic diseases classified elsewhere: Secondary | ICD-10-CM | POA: Diagnosis present

## 2013-09-09 DIAGNOSIS — R6510 Systemic inflammatory response syndrome (SIRS) of non-infectious origin without acute organ dysfunction: Secondary | ICD-10-CM | POA: Diagnosis present

## 2013-09-09 DIAGNOSIS — A4101 Sepsis due to Methicillin susceptible Staphylococcus aureus: Secondary | ICD-10-CM | POA: Diagnosis present

## 2013-09-09 DIAGNOSIS — R509 Fever, unspecified: Secondary | ICD-10-CM | POA: Diagnosis present

## 2013-09-09 DIAGNOSIS — Y841 Kidney dialysis as the cause of abnormal reaction of the patient, or of later complication, without mention of misadventure at the time of the procedure: Secondary | ICD-10-CM | POA: Diagnosis present

## 2013-09-09 DIAGNOSIS — I120 Hypertensive chronic kidney disease with stage 5 chronic kidney disease or end stage renal disease: Secondary | ICD-10-CM | POA: Diagnosis present

## 2013-09-09 DIAGNOSIS — R51 Headache: Secondary | ICD-10-CM | POA: Diagnosis not present

## 2013-09-09 DIAGNOSIS — N2581 Secondary hyperparathyroidism of renal origin: Secondary | ICD-10-CM | POA: Diagnosis present

## 2013-09-09 DIAGNOSIS — T827XXS Infection and inflammatory reaction due to other cardiac and vascular devices, implants and grafts, sequela: Secondary | ICD-10-CM

## 2013-09-09 DIAGNOSIS — D509 Iron deficiency anemia, unspecified: Secondary | ICD-10-CM | POA: Diagnosis present

## 2013-09-09 DIAGNOSIS — R6889 Other general symptoms and signs: Secondary | ICD-10-CM | POA: Diagnosis not present

## 2013-09-09 DIAGNOSIS — Z113 Encounter for screening for infections with a predominantly sexual mode of transmission: Secondary | ICD-10-CM

## 2013-09-09 DIAGNOSIS — K219 Gastro-esophageal reflux disease without esophagitis: Secondary | ICD-10-CM | POA: Diagnosis present

## 2013-09-09 DIAGNOSIS — Z992 Dependence on renal dialysis: Secondary | ICD-10-CM

## 2013-09-09 DIAGNOSIS — R5081 Fever presenting with conditions classified elsewhere: Secondary | ICD-10-CM | POA: Diagnosis not present

## 2013-09-09 LAB — CBC WITH DIFFERENTIAL/PLATELET
Basophils Absolute: 0 10*3/uL (ref 0.0–0.1)
Eosinophils Relative: 0 % (ref 0–5)
HCT: 43.9 % (ref 39.0–52.0)
Hemoglobin: 15.6 g/dL (ref 13.0–17.0)
Lymphs Abs: 0.8 10*3/uL (ref 0.7–4.0)
MCH: 31.6 pg (ref 26.0–34.0)
MCHC: 35.5 g/dL (ref 30.0–36.0)
Neutro Abs: 10.7 10*3/uL — ABNORMAL HIGH (ref 1.7–7.7)
Platelets: 167 10*3/uL (ref 150–400)
RDW: 14.6 % (ref 11.5–15.5)

## 2013-09-09 LAB — COMPREHENSIVE METABOLIC PANEL
ALT: 10 U/L (ref 0–53)
AST: 21 U/L (ref 0–37)
Alkaline Phosphatase: 62 U/L (ref 39–117)
BUN: 23 mg/dL (ref 6–23)
CO2: 24 mEq/L (ref 19–32)
Calcium: 10.2 mg/dL (ref 8.4–10.5)
Creatinine, Ser: 10.53 mg/dL — ABNORMAL HIGH (ref 0.50–1.35)
GFR calc Af Amer: 6 mL/min — ABNORMAL LOW (ref >90)
GFR calc non Af Amer: 5 mL/min — ABNORMAL LOW (ref >90)
Glucose, Bld: 72 mg/dL (ref 70–99)
Potassium: 4.4 mEq/L (ref 3.5–5.1)
Sodium: 136 mEq/L (ref 135–145)
Total Bilirubin: 1 mg/dL (ref 0.3–1.2)
Total Protein: 9.2 g/dL — ABNORMAL HIGH (ref 6.0–8.3)

## 2013-09-09 MED ORDER — ACETAMINOPHEN 325 MG PO TABS
650.0000 mg | ORAL_TABLET | Freq: Once | ORAL | Status: AC
  Administered 2013-09-09: 650 mg via ORAL
  Filled 2013-09-09: qty 2

## 2013-09-09 MED ORDER — ACETAMINOPHEN 500 MG PO TABS
1000.0000 mg | ORAL_TABLET | Freq: Once | ORAL | Status: AC
  Administered 2013-09-09: 1000 mg via ORAL
  Filled 2013-09-09: qty 2

## 2013-09-09 NOTE — ED Notes (Signed)
Pt given [place] Sandwich and ginger ale sitting on side of stretcher eating at this time.

## 2013-09-09 NOTE — ED Notes (Signed)
Per EMS pt from dialysis center in [place] with c/o fever. Pt began dialysis treatment at 6am, around 1300 began to have fever. 102.1, states left arm feels warmer than right. Pt c/o headache. Pt finished dialysis treatment. Given vancomycin 1500mg . VSS.

## 2013-09-09 NOTE — ED Notes (Signed)
Received  Call from IV team st's it will be a while before they can attempted IV access due to call volume.

## 2013-09-09 NOTE — ED Notes (Signed)
Pt requesting something to eat.  

## 2013-09-09 NOTE — ED Provider Notes (Signed)
CSN: [id number]     Arrival date & time 09/09/13  1445 History   First MD Initiated Contact with Patient 09/09/13 1555     Chief Complaint  Patient presents with  . Fever  . Vascular Access Problem   (Consider location/radiation/quality/duration/timing/severity/associated sxs/prior Treatment) HPI Comments: Patient brought to the ER by ambulance from dialysis center. Patient reports that he presented for dialysis this morning as usual. He was in his normal health, no complaints. During the session, however, he began to have fever, chills, sweats. He had a documented fever of 102.1. Patient finished the session and then was given vancomycin. He was sent to the ER for further evaluation. Patient denies any other symptoms. He has not had any cough, congestion, vomiting, diarrhea or other symptoms to explain the fever.  Patient reports that he had a recent right upper extremity dialysis fistula infection which was removed. He recently had a left forearm fistula placed, but it has not been used yet. Dialysis access is currently through a right subclavian line.  Patient is a 42 y.o. male presenting with fever.  Fever Associated symptoms: chills     Past Medical History  Diagnosis Date  . Chronic kidney disease   . Hypertension   . Hemodialysis finding May 2 ,2005    First HD at [facility].  . Anemia     Iron deficiency  . End stage renal disease on dialysis   . Aneurysm 02/15/2007    Right arm large aneurysm  . GERD (gastroesophageal reflux disease)    Past Surgical History  Procedure Laterality Date  . Ligation of arteriovenous  fistula Right 04/22/2013    Procedure: LIGATION OF ARTERIOVENOUS  FISTULA  AND RESECTION OF VENOUS ANEURYSM AND ABSCESS;  Surgeon: Rosetta Posner, MD;  Location: [place];  Service: Vascular;  Laterality: Right;  . Insertion of dialysis catheter Right 04/22/2013    Procedure: INSERTION OF DIALYSIS CATHETER;  Surgeon: Rosetta Posner, MD;  Location: [place];  Service:  Vascular;  Laterality: Right;  . Av fistula placement    . Colonscopy    . Colonoscopy      Hxl: of  . Av fistula placement Left 05/03/2013    Procedure: ARTERIOVENOUS (AV) FISTULA CREATION;  Surgeon: Rosetta Posner, MD;  Location: [place];  Service: Vascular;  Laterality: Left;  . Fistula superficialization Left 07/24/2013    Procedure: FISTULA SUPERFICIALIZATION AND LIGATION OF BRANCHES;  Surgeon: Rosetta Posner, MD;  Location: [facility] OR;  Service: Vascular;  Laterality: Left;   Family History  Problem Relation Age of Onset  . Hypertension Mother   . Heart disease Mother     before age 21  . Other Brother     DVT   History  Substance Use Topics  . Smoking status: Light Tobacco Smoker    Types: Cigars  . Smokeless tobacco: Never Used     Comment: pt states he smokes about 3 cigars per year  . Alcohol Use: Yes     Comment: 3 drinks a year    Review of Systems  Constitutional: Positive for fever and chills.  Respiratory: Negative.   Cardiovascular: Negative.   Gastrointestinal: Negative.   All other systems reviewed and are negative.    Allergies  Review of patient's allergies indicates no known allergies.  Home Medications   Current Outpatient Rx  Name  Route  Sig  Dispense  Refill  . Acetaminophen (TYLENOL EXTRA STRENGTH PO)   Oral   Take 2  tablets by mouth once as needed (headaches).         Marland Kitchen b complex-vitamin c-folic acid (NEPHRO-VITE) 0.8 MG TABS   Oral   Take 0.8 mg by mouth daily.         . diphenhydrAMINE (BENADRYL) 25 MG tablet   Oral   Take 25 mg by mouth every 6 (six) hours as needed for itching.         . midodrine (PROAMATINE) 5 MG tablet   Oral   Take 5 mg by mouth as needed. Take 1 capsule in the morning prior to dialysis to increase blood pressure         . ranitidine (ZANTAC) 150 MG capsule   Oral   Take 150 mg by mouth daily as needed for heartburn. For heartburn         . sevelamer (RENVELA) 800 MG tablet   Oral   Take 1,600-2,400 mg  by mouth 3 (three) times daily with meals. Takes either 1600mg  or 2400mg  with each meal depending on the size of the meal         . cinacalcet (SENSIPAR) 60 MG tablet   Oral   Take 60 mg by mouth daily.         Marland Kitchen doxycycline (DORYX) 100 MG EC tablet   Oral   Take 100 mg by mouth 2 (two) times daily. Pt completed course 2 weeks ago          BP 125/69  Pulse 121  Temp(Src) 101 F (38.3 C) (Oral)  Resp 27  SpO2 97% Physical Exam  Constitutional: He is oriented to person, place, and time. He appears well-developed and well-nourished. No distress.  HENT:  Head: Normocephalic and atraumatic.  Right Ear: Hearing normal.  Left Ear: Hearing normal.  Nose: Nose normal.  Mouth/Throat: Oropharynx is clear and moist and mucous membranes are normal.  Eyes: Conjunctivae and EOM are normal. Pupils are equal, round, and reactive to light.  Neck: Normal range of motion. Neck supple.  Cardiovascular: Regular rhythm, S1 normal and S2 normal.  Exam reveals no gallop and no friction rub.   No murmur heard. Pulmonary/Chest: Effort normal and breath sounds normal. No respiratory distress. He exhibits no tenderness.  Abdominal: Soft. Normal appearance and bowel sounds are normal. There is no hepatosplenomegaly. There is no tenderness. There is no rebound, no guarding, no tenderness at McBurney's point and negative Murphy's sign. No hernia.  Musculoskeletal: Normal range of motion.  Neurological: He is alert and oriented to person, place, and time. He has normal strength. No cranial nerve deficit or sensory deficit. Coordination normal. GCS eye subscore is 4. GCS verbal subscore is 5. GCS motor subscore is 6.  Skin: Skin is warm, dry and intact. No rash noted. No cyanosis.  Psychiatric: He has a normal mood and affect. His speech is normal and behavior is normal. Thought content normal.    ED Course  Procedures (including critical care time) Labs Review Labs Reviewed  CULTURE, BLOOD (ROUTINE X  2)  CULTURE, BLOOD (ROUTINE X 2)  CBC WITH DIFFERENTIAL  COMPREHENSIVE METABOLIC PANEL   Imaging Review Dg Chest Port 1 View  09/09/2013   CLINICAL DATA:  Fever  EXAM: PORTABLE CHEST - 1 VIEW  COMPARISON:  05/03/2013  FINDINGS: A right-sided dialysis catheter is appreciated unchanged when compared to previous study. The cardiac silhouette is mild-to-moderately enlarged. There is prominence of the interstitial markings without peribronchial cuffing. No focal region of consolidation or focal infiltrates are appreciated.  EKG leads are appreciated within the right left hemithoraces. The visualized osseous structures are grossly unremarkable.  IMPRESSION: 1. Pulmonary vascular congestion/mild edema 2. No focal regions of consolidation or focal infiltrates.   Electronically Signed   By: Margaree Mackintosh M.D.   On: 09/09/2013 15:33    EKG Interpretation   None      Date: 09/09/2013  Rate: 130  Rhythm: sinus tachycardia  QRS Axis: left  Intervals: normal  ST/T Wave abnormalities: normal  Conduction Disutrbances:none  Narrative Interpretation:   Old EKG Reviewed: unchanged    MDM  Diagnosis: Fever  Patient presents to the ER for evaluation of fever. Patient was sent to the ER from the dialysis center. Patient had spiked a fever while undergoing dialysis. This session was finished. Blood cultures were drawn and the patient was given vancomycin. It's not clear why the patient was sent to the ER. I did call his nephrologist, and Doctor Posey Pronto, who does indicate to me that the patient has been adequately treated. He did not feel that the patient needed his subclavian line removed. Patient will be treated as an outpatient with vancomycin after dialysis and await cultures.  Patient was noted to become tachycardic after these arrangements were made. His temperature was rechecked and had spiked fever up to 102 again. He was one hour shorter panel to be given Tylenol. Tylenol was given at 4 hours after  his previous dose and patient monitored. Continues to do well.    Orpah Greek, MD 09/09/13 [phone]

## 2013-09-09 NOTE — ED Notes (Signed)
Ice given to pt per Dr. Betsey Holiday.

## 2013-09-09 NOTE — ED Notes (Signed)
IV team paged.  

## 2013-09-09 NOTE — ED Notes (Signed)
Pt coming from dialysis with c/o fever/chills. States began shortly after dialysis started around 6am. Pt reports fever of 102.1 at dialysis. On arrival temp 101. Pt tachycardic, tachypnic. Pt states hx of catheter has become infected before. This catheter placed in July. Reports mild headache.

## 2013-09-09 NOTE — ED Notes (Signed)
Attempted x's 2 without success.  IV team called.

## 2013-09-10 ENCOUNTER — Encounter (HOSPITAL_COMMUNITY): Payer: Self-pay | Admitting: Internal Medicine

## 2013-09-10 DIAGNOSIS — R509 Fever, unspecified: Secondary | ICD-10-CM | POA: Diagnosis present

## 2013-09-10 DIAGNOSIS — R6510 Systemic inflammatory response syndrome (SIRS) of non-infectious origin without acute organ dysfunction: Secondary | ICD-10-CM | POA: Diagnosis not present

## 2013-09-10 DIAGNOSIS — I517 Cardiomegaly: Secondary | ICD-10-CM

## 2013-09-10 DIAGNOSIS — N186 End stage renal disease: Secondary | ICD-10-CM

## 2013-09-10 LAB — CREATININE, SERUM
Creatinine, Ser: 14.59 mg/dL — ABNORMAL HIGH (ref 0.50–1.35)
GFR calc Af Amer: 4 mL/min — ABNORMAL LOW (ref >90)
GFR calc non Af Amer: 4 mL/min — ABNORMAL LOW (ref >90)

## 2013-09-10 LAB — CBC
HCT: 38.3 % — ABNORMAL LOW (ref 39.0–52.0)
Hemoglobin: 13.7 g/dL (ref 13.0–17.0)
MCHC: 35.8 g/dL (ref 30.0–36.0)

## 2013-09-10 MED ORDER — VANCOMYCIN HCL 10 G IV SOLR
1500.0000 mg | Freq: Once | INTRAVENOUS | Status: AC
  Administered 2013-09-10: 1500 mg via INTRAVENOUS
  Filled 2013-09-10 (×2): qty 1500

## 2013-09-10 MED ORDER — PIPERACILLIN-TAZOBACTAM 3.375 G IVPB
3.3750 g | Freq: Once | INTRAVENOUS | Status: AC
  Administered 2013-09-10: 3.375 g via INTRAVENOUS
  Filled 2013-09-10: qty 50

## 2013-09-10 MED ORDER — POLYETHYLENE GLYCOL 3350 17 G PO PACK
17.0000 g | PACK | Freq: Every day | ORAL | Status: DC | PRN
  Filled 2013-09-10: qty 1

## 2013-09-10 MED ORDER — SODIUM CHLORIDE 0.9 % IV SOLN
Freq: Once | INTRAVENOUS | Status: AC
  Administered 2013-09-10: 08:00:00 via INTRAVENOUS

## 2013-09-10 MED ORDER — ONDANSETRON HCL 4 MG PO TABS: 4.0000 mg | ORAL_TABLET | Freq: Four times a day (QID) | ORAL | Status: DC | PRN

## 2013-09-10 MED ORDER — DIPHENHYDRAMINE HCL 25 MG PO CAPS
25.0000 mg | ORAL_CAPSULE | Freq: Four times a day (QID) | ORAL | Status: DC | PRN
  Administered 2013-09-11: 25 mg via ORAL
  Filled 2013-09-10: qty 1

## 2013-09-10 MED ORDER — SEVELAMER CARBONATE 800 MG PO TABS
1600.0000 mg | ORAL_TABLET | Freq: Three times a day (TID) | ORAL | Status: DC
  Administered 2013-09-11 (×2): 1800 mg via ORAL
  Filled 2013-09-10 (×6): qty 3

## 2013-09-10 MED ORDER — CINACALCET HCL 30 MG PO TABS
60.0000 mg | ORAL_TABLET | Freq: Every day | ORAL | Status: DC
  Administered 2013-09-10 – 2013-09-14 (×4): 60 mg via ORAL
  Filled 2013-09-10 (×6): qty 2

## 2013-09-10 MED ORDER — ONDANSETRON HCL 4 MG/2ML IJ SOLN: 4.0000 mg | Freq: Four times a day (QID) | INTRAMUSCULAR | Status: DC | PRN

## 2013-09-10 MED ORDER — ACETAMINOPHEN 325 MG PO TABS
650.0000 mg | ORAL_TABLET | Freq: Once | ORAL | Status: AC
  Administered 2013-09-10: 650 mg via ORAL
  Filled 2013-09-10: qty 2

## 2013-09-10 MED ORDER — FAMOTIDINE 20 MG PO TABS
20.0000 mg | ORAL_TABLET | Freq: Every day | ORAL | Status: DC
  Administered 2013-09-10 – 2013-09-14 (×5): 20 mg via ORAL
  Filled 2013-09-10 (×5): qty 1

## 2013-09-10 MED ORDER — ONDANSETRON HCL 4 MG/2ML IJ SOLN: 4.0000 mg | Freq: Three times a day (TID) | INTRAMUSCULAR | Status: AC | PRN

## 2013-09-10 MED ORDER — RENA-VITE PO TABS
1.0000 | ORAL_TABLET | Freq: Every day | ORAL | Status: DC
  Administered 2013-09-10 – 2013-09-13 (×4): 1 via ORAL
  Filled 2013-09-10 (×5): qty 1

## 2013-09-10 MED ORDER — HEPARIN SODIUM (PORCINE) 5000 UNIT/ML IJ SOLN
5000.0000 [IU] | Freq: Three times a day (TID) | INTRAMUSCULAR | Status: DC
  Administered 2013-09-10 – 2013-09-14 (×11): 5000 [IU] via SUBCUTANEOUS
  Filled 2013-09-10 (×15): qty 1

## 2013-09-10 NOTE — H&P (Addendum)
[facility] History and Physical  [name] [id number] DOB: 1970/11/25 DOA: 09/09/2013  Referring physician: none PCP: Ulla Potash., MD  Specialists: Renal  Chief Complaint: fevers  HPI: [name] is a 42 y.o. male  With past medical history of end-stage renal disease, anemia, was brought from the dialysis center by ambulance, at the dialysis center he was given vancomycin and sent to the emergency room. He relates he started having fevers 5 minutes after being connected to dialysis machine chills and chest nauseated. Dialysis was continued he was given a dose of Bank and, in improved. In the emergency room he relates some fever, no cough shortness of breath chest pain culture sores or sick contacts.   In the ED: He developed a fever of 103 tachycardic blood pressure stable CBC was done that showed a white count of 12 with a ANC of 0000000 basic metabolic panel showed creatinine of 10.5. Chest x-ray was done that showed no acute cardiopulmonary disease we are consulted for further evaluation.  Review of Systems: The patient denies anorexia, weight loss,, vision loss, decreased hearing, hoarseness, chest pain, syncope, dyspnea on exertion, peripheral edema, balance deficits, hemoptysis, abdominal pain, melena, hematochezia, severe indigestion/heartburn, hematuria, incontinence, genital sores, muscle weakness, suspicious skin lesions, transient blindness, difficulty walking, depression, unusual weight change, abnormal bleeding, enlarged lymph nodes, angioedema, and breast masses.    Past Medical History  Diagnosis Date  . Chronic kidney disease   . Hypertension   . Hemodialysis finding May 2 ,2005    First HD at [facility].  . Anemia     Iron deficiency  . End stage renal disease on dialysis   . Aneurysm 02/15/2007    Right arm large aneurysm  . GERD (gastroesophageal reflux disease)    Past Surgical History  Procedure Laterality Date  . Ligation of  arteriovenous  fistula Right 04/22/2013    Procedure: LIGATION OF ARTERIOVENOUS  FISTULA  AND RESECTION OF VENOUS ANEURYSM AND ABSCESS;  Surgeon: Rosetta Posner, MD;  Location: [place];  Service: Vascular;  Laterality: Right;  . Insertion of dialysis catheter Right 04/22/2013    Procedure: INSERTION OF DIALYSIS CATHETER;  Surgeon: Rosetta Posner, MD;  Location: [place];  Service: Vascular;  Laterality: Right;  . Av fistula placement    . Colonscopy    . Colonoscopy      Hxl: of  . Av fistula placement Left 05/03/2013    Procedure: ARTERIOVENOUS (AV) FISTULA CREATION;  Surgeon: Rosetta Posner, MD;  Location: [place];  Service: Vascular;  Laterality: Left;  . Fistula superficialization Left 07/24/2013    Procedure: FISTULA SUPERFICIALIZATION AND LIGATION OF BRANCHES;  Surgeon: Rosetta Posner, MD;  Location: [place];  Service: Vascular;  Laterality: Left;   Social History:  reports that he has been smoking Cigars.  He has never used smokeless tobacco. He reports that he drinks alcohol. He reports that he does not use illicit drugs. Is at home with family  No Known Allergies  Family History  Problem Relation Age of Onset  . Hypertension Mother   . Heart disease Mother     before age 61  . Other Brother     DVT    Prior to Admission medications   Medication Sig Start Date End Date Taking? Authorizing Provider  Acetaminophen (TYLENOL EXTRA STRENGTH PO) Take 2 tablets by mouth once as needed (headaches).   Yes Historical Provider, MD  b complex-vitamin c-folic acid (NEPHRO-VITE) 0.8 MG TABS Take  0.8 mg by mouth daily.   Yes Historical Provider, MD  diphenhydrAMINE (BENADRYL) 25 MG tablet Take 25 mg by mouth every 6 (six) hours as needed for itching.   Yes Historical Provider, MD  midodrine (PROAMATINE) 5 MG tablet Take 5 mg by mouth as needed. Take 1 capsule in the morning prior to dialysis to increase blood pressure   Yes Historical Provider, MD  ranitidine (ZANTAC) 150 MG capsule Take 150 mg by mouth daily  as needed for heartburn. For heartburn   Yes Historical Provider, MD  sevelamer (RENVELA) 800 MG tablet Take 1,600-2,400 mg by mouth 3 (three) times daily with meals. Takes either 1600mg  or 2400mg  with each meal depending on the size of the meal   Yes Historical Provider, MD  cinacalcet (SENSIPAR) 60 MG tablet Take 60 mg by mouth daily.    Historical Provider, MD  doxycycline (DORYX) 100 MG EC tablet Take 100 mg by mouth 2 (two) times daily. Pt completed course 2 weeks ago    Historical Provider, MD   Physical Exam: Filed Vitals:   09/10/13 0622  BP: 112/48  Pulse: 117  Temp: 102.9 F (39.4 C)  Resp: 18    BP 112/48  Pulse 117  Temp(Src) 102.9 F (39.4 C) (Oral)  Resp 18  SpO2 98%  General Appearance:    Alert, cooperative, no distress, appears stated age  Head:    Normocephalic, without obvious abnormality, atraumatic           Throat:   Lips, mucosa, and tongue dry  Neck:   Supple, symmetrical, trachea midline, no adenopathy;       thyroid:  No JVD     Lungs:     Clear to auscultation bilaterally, respirations unlabored     Heart:    Regular rate and rhythm, S1 and S2 normal, no murmur, rub   or gallop  Abdomen:     Soft, non-tender, bowel sounds active all four quadrants,    no masses, no organomegaly        Extremities:   Extremities normal, atraumatic, no cyanosis or edema  Pulses:   2+ and symmetric all extremities  Skin:   .skin no appreciated redness but he does have a little bit of pus around the dietekcath      Neurologic:   CNII-XII intact. Normal strength, sensation and reflexes      throughout     Labs on Admission:  Basic Metabolic Panel:  Recent Labs Lab 09/09/13 1711  NA 136  K 4.4  CL 94*  CO2 24  GLUCOSE 72  BUN 23  CREATININE 10.53*  CALCIUM 10.2   Liver Function Tests:  Recent Labs Lab 09/09/13 1711  AST 21  ALT 10  ALKPHOS 62  BILITOT 1.0  PROT 9.2*  ALBUMIN 3.7   No results found for this basename: LIPASE, AMYLASE,  in the  last 168 hours No results found for this basename: AMMONIA,  in the last 168 hours CBC:  Recent Labs Lab 09/09/13 1711  WBC 12.8*  NEUTROABS 10.7*  HGB 15.6  HCT 43.9  MCV 89.0  PLT 167   Cardiac Enzymes: No results found for this basename: CKTOTAL, CKMB, CKMBINDEX, TROPONINI,  in the last 168 hours  BNP (last 3 results) No results found for this basename: PROBNP,  in the last 8760 hours CBG: No results found for this basename: GLUCAP,  in the last 168 hours  Radiological Exams on Admission: Dg Chest Port 1 View  09/09/2013  CLINICAL DATA:  Fever  EXAM: PORTABLE CHEST - 1 VIEW  COMPARISON:  05/03/2013  FINDINGS: A right-sided dialysis catheter is appreciated unchanged when compared to previous study. The cardiac silhouette is mild-to-moderately enlarged. There is prominence of the interstitial markings without peribronchial cuffing. No focal region of consolidation or focal infiltrates are appreciated. EKG leads are appreciated within the right left hemithoraces. The visualized osseous structures are grossly unremarkable.  IMPRESSION: 1. Pulmonary vascular congestion/mild edema 2. No focal regions of consolidation or focal infiltrates.   Electronically Signed   By: Margaree Mackintosh M.D.   On: 09/09/2013 15:33    EKG: Independently reviewed. none  Assessment/Plan Sirs /End stage renal disease/ Fever in adult; - I have already consulted renal for possible dialysis, he does have a little bit of pus around the catheter site, and doesn't look will maintain. He relates fever 5 minutes after getting his dialysis along with chills and nausea, most likely side of the source of infection is probably his diet to catheter. We'll discuss with renal to see that could be removed. Also get a 2-D echo to rule out agitation. I will go ahead and stop Manuela Schwartz his most probable cause probably step our staff will start him on IV vancomycin. Get blood cultures x2. - Stop his midodrine.  Consulted  nephrology  Code Status: full Family Communication: none  Disposition Plan: inpatient  Time spent: 80 minutes  Charlynne Cousins [facility] Pager [phone]  If 7PM-7AM, please contact night-coverage [email or web address] Password [place] 09/10/2013, 7:56 AM

## 2013-09-10 NOTE — ED Provider Notes (Signed)
42 year old male, dialysis patient, dialysis catheter in the right upper chest presents with fever from dialysis yesterday. He has been persistently febrile and tachycardic overnight, he has a leukocytosis of over 12,000, has no other symptoms at this time including no belly pain, no shortness of breath, no sinus pressure tenderness or drainage, no sore throat, no rashes. On exam he has a soft abdomen, clear her lung sounds, clear oropharynx, he is tachycardic. He does not have any drainage tenderness or redness surrounding the right subclavian catheter location.  Upper arm fistula shows no redness tenderness or induration, good thrill  Due to the ongoing fever and since he has been given vancomycin already, I will have a peripheral IV placed, dry lactic acid, blood cultures, IV Zosyn and admitted to the hospital.  D/w Dr. Posey Pronto - agrees with admission.  Johnna Acosta, MD 09/10/13 [phone]

## 2013-09-10 NOTE — ED Notes (Signed)
IV team called to establish PIV site.

## 2013-09-10 NOTE — Progress Notes (Signed)
  Echocardiogram 2D Echocardiogram has been performed.  [name] 09/10/2013, 2:04 PM

## 2013-09-10 NOTE — Progress Notes (Signed)
[facility] PROGRESS NOTE  [name]  [id number]  DOB: 27-Jan-1971  DOA: 09/09/2013 PCP: Ulla Potash., MD  HPI/Subjective: Presented with the complaint of fever after hemodialysis. Received one dose of vancomycin after hemodialysis. No other symptoms as per the ER physician.  Assessment and Plan: Hypotensive, tachycardic. Has elevated white count. No obvious source of infection at present is as per ER physician. Patient is given Zosyn on top of IV vancomycin. Admit to telemetry.  Author: Berle Mull, MD [facility] Pager: [phone] 09/10/2013 6:56 AM   If 7PM-7AM, please contact night-coverage at [email or web address], password [facility]

## 2013-09-10 NOTE — Progress Notes (Signed)
ANTIBIOTIC CONSULT NOTE - INITIAL  Pharmacy Consult for Vancomycin Indication: r/o HD catheter infection   No Known Allergies  Patient Measurements: Height: 5\' 11"  (180.3 cm) Weight: 317 lb 7.4 oz (144 kg) IBW/kg (Calculated) : 75.3 Adjusted Body Weight: n/a  Vital Signs: Temp: 99.5 F (37.5 C) (11/18 1128) Temp src: Oral (11/18 1128) BP: 102/64 mmHg (11/18 0841) Pulse Rate: 134 (11/18 0841) Intake/Output from previous day:   Intake/Output from this shift:    Labs:  Recent Labs  09/09/13 1711 09/10/13 1000  WBC 12.8* 11.3*  HGB 15.6 13.7  PLT 167 146*  CREATININE 10.53* 14.59*   Estimated Creatinine Clearance: 9.4 ml/min (by C-G formula based on Cr of 14.59). No results found for this basename: VANCOTROUGH, Corlis Leak, VANCORANDOM, [place], GENTPEAK, GENTRANDOM, [place], TOBRAPEAK, TOBRARND, AMIKACINPEAK, AMIKACINTROU, AMIKACIN,  in the last 72 hours   Microbiology: Recent Results (from the past 720 hour(s))  CULTURE, BLOOD (ROUTINE X 2)     Status: None   Collection Time    09/09/13  4:48 PM      Result Value Range Status   Specimen Description BLOOD HAND RIGHT   Final   Special Requests BOTTLES DRAWN AEROBIC ONLY 2.5CC   Final   Culture  Setup Time     Final   Value: 09/09/2013 22:43     Performed at [facility]   Culture     Final   Value:        BLOOD CULTURE RECEIVED NO GROWTH TO DATE CULTURE WILL BE HELD FOR 5 DAYS BEFORE ISSUING A FINAL NEGATIVE REPORT     Performed at [facility]   Report Status PENDING   Incomplete    Medical History: Past Medical History  Diagnosis Date  . Chronic kidney disease   . Hypertension   . Hemodialysis finding May 2 ,2005    First HD at [facility].  . Anemia     Iron deficiency  . End stage renal disease on dialysis   . Aneurysm 02/15/2007    Right arm large aneurysm  . GERD (gastroesophageal reflux disease)     Medications:  Prescriptions prior to admission  Medication Sig  Dispense Refill  . Acetaminophen (TYLENOL EXTRA STRENGTH PO) Take 2 tablets by mouth once as needed (headaches).      Marland Kitchen b complex-vitamin c-folic acid (NEPHRO-VITE) 0.8 MG TABS Take 0.8 mg by mouth daily.      . diphenhydrAMINE (BENADRYL) 25 MG tablet Take 25 mg by mouth every 6 (six) hours as needed for itching.      . midodrine (PROAMATINE) 5 MG tablet Take 5 mg by mouth as needed. Take 1 capsule in the morning prior to dialysis to increase blood pressure      . ranitidine (ZANTAC) 150 MG capsule Take 150 mg by mouth daily as needed for heartburn. For heartburn      . sevelamer (RENVELA) 800 MG tablet Take 1,600-2,400 mg by mouth 3 (three) times daily with meals. Takes either 1600mg  or 2400mg  with each meal depending on the size of the meal      . cinacalcet (SENSIPAR) 60 MG tablet Take 60 mg by mouth daily.      Marland Kitchen doxycycline (DORYX) 100 MG EC tablet Take 100 mg by mouth 2 (two) times daily. Pt completed course 2 weeks ago       Assessment: 38 yom was brought to ER from the dialysis center in [place] after he began to have chills, fever and sweats  during his dialysis session. Documented fever of 102.1. Patient completed the dialysis session and was given 1500 mg vancomycin after dialysis. In ED, he had WBC of 12, developed a fever of 103, was tachycardic with stable BP. Vd 129.6L  Goal of Therapy:  Pre-HD vanc level: 15-25   Plan:  1) Give additional vancomycin loading dose of 1500 mg IV x 1 dose today 2) F/u HD plans  3) Monitor CBC, cultures, and patient clinical status   Albertina Parr, PharmD.  Clinical Pharmacist Pager [phone]

## 2013-09-11 DIAGNOSIS — A4101 Sepsis due to Methicillin susceptible Staphylococcus aureus: Secondary | ICD-10-CM | POA: Diagnosis not present

## 2013-09-11 DIAGNOSIS — R112 Nausea with vomiting, unspecified: Secondary | ICD-10-CM | POA: Diagnosis not present

## 2013-09-11 DIAGNOSIS — R6510 Systemic inflammatory response syndrome (SIRS) of non-infectious origin without acute organ dysfunction: Secondary | ICD-10-CM | POA: Diagnosis not present

## 2013-09-11 DIAGNOSIS — R509 Fever, unspecified: Secondary | ICD-10-CM | POA: Diagnosis not present

## 2013-09-11 DIAGNOSIS — I120 Hypertensive chronic kidney disease with stage 5 chronic kidney disease or end stage renal disease: Secondary | ICD-10-CM | POA: Diagnosis not present

## 2013-09-11 DIAGNOSIS — N186 End stage renal disease: Secondary | ICD-10-CM | POA: Diagnosis not present

## 2013-09-11 LAB — CBC
Hemoglobin: 13.8 g/dL (ref 13.0–17.0)
Platelets: 128 10*3/uL — ABNORMAL LOW (ref 150–400)
RBC: 4.34 MIL/uL (ref 4.22–5.81)
WBC: 7.8 10*3/uL (ref 4.0–10.5)

## 2013-09-11 LAB — BASIC METABOLIC PANEL
CO2: 20 mEq/L (ref 19–32)
Glucose, Bld: 87 mg/dL (ref 70–99)
Potassium: 4.6 mEq/L (ref 3.5–5.1)
Sodium: 131 mEq/L — ABNORMAL LOW (ref 135–145)

## 2013-09-11 MED ORDER — DIPHENHYDRAMINE HCL 25 MG PO CAPS
ORAL_CAPSULE | ORAL | Status: AC
  Filled 2013-09-11: qty 1

## 2013-09-11 MED ORDER — MIDODRINE HCL 5 MG PO TABS
5.0000 mg | ORAL_TABLET | ORAL | Status: DC
  Administered 2013-09-11 – 2013-09-13 (×2): 5 mg via ORAL
  Filled 2013-09-11 (×2): qty 1

## 2013-09-11 MED ORDER — SEVELAMER CARBONATE 800 MG PO TABS
1600.0000 mg | ORAL_TABLET | Freq: Three times a day (TID) | ORAL | Status: DC
  Filled 2013-09-11 (×2): qty 2

## 2013-09-11 MED ORDER — SEVELAMER CARBONATE 800 MG PO TABS
3200.0000 mg | ORAL_TABLET | Freq: Three times a day (TID) | ORAL | Status: DC
  Administered 2013-09-11 – 2013-09-12 (×3): 3200 mg via ORAL
  Administered 2013-09-14: 1600 mg via ORAL
  Administered 2013-09-14 (×2): 3200 mg via ORAL
  Filled 2013-09-11 (×11): qty 4

## 2013-09-11 MED ORDER — LOPERAMIDE HCL 2 MG PO CAPS
2.0000 mg | ORAL_CAPSULE | ORAL | Status: DC | PRN
  Administered 2013-09-11: 2 mg via ORAL
  Filled 2013-09-11: qty 1

## 2013-09-11 MED ORDER — VANCOMYCIN HCL IN DEXTROSE 1-5 GM/200ML-% IV SOLN
1000.0000 mg | INTRAVENOUS | Status: DC
  Administered 2013-09-11: 1000 mg via INTRAVENOUS
  Filled 2013-09-11 (×4): qty 200

## 2013-09-11 MED ORDER — LOPERAMIDE HCL 2 MG PO CAPS
ORAL_CAPSULE | ORAL | Status: AC
  Filled 2013-09-11: qty 1

## 2013-09-11 NOTE — Progress Notes (Addendum)
[facility] PROGRESS NOTE  [name] [id number] DOB: 1971/01/09 DOA: 09/09/2013 PCP: Ulla Potash., MD  Assessment/Plan: 1. Sepsis -suspect HD catheter related infection, improving -continue IV vanc, Fu cultures -got one dose of IV Vanc at Dialysis center prior to admission which may affect cultures - i think HD catheter needs removal, await cultures and Renal input  2. Diarrhea -suspect related to sepsis, abx -improving -Cdiff pCR negative  3. ESRD on HD MWF -due for HD today -await Renal input -has AVF in LUE which is was placed July 11  Code Status: Full Family Communication: none at bedside Disposition Plan: home when improved   Consultants:  Renal  Antibiotics:  vancomycin  HPI/Subjective: Feels much better on Abx  Objective: Filed Vitals:   09/11/13 1000  BP: 122/61  Pulse: 98  Temp: 98.2 F (36.8 C)  Resp: 18    Intake/Output Summary (Last 24 hours) at 09/11/13 1402 Last data filed at 09/11/13 0900  Gross per 24 hour  Intake  12250 ml  Output      0 ml  Net  12250 ml   Filed Weights   09/10/13 0823 09/10/13 2157  Weight: 144 kg (317 lb 7.4 oz) 141.794 kg (312 lb 9.6 oz)    Exam:   General: AAOx3  Cardiovascular: S1s2/RRR  Chest: HD catheter noted  Respiratory: CTAB  Abdomen: soft, NT, BS present  Musculoskeletal: no edema c/c   Data Reviewed: Basic Metabolic Panel:  Recent Labs Lab 09/09/13 1711 09/10/13 1000 09/11/13 0525  NA 136  --  131*  K 4.4  --  4.6  CL 94*  --  92*  CO2 24  --  20  GLUCOSE 72  --  87  BUN 23  --  52*  CREATININE 10.53* 14.59* 17.73*  CALCIUM 10.2  --  9.5   Liver Function Tests:  Recent Labs Lab 09/09/13 1711  AST 21  ALT 10  ALKPHOS 62  BILITOT 1.0  PROT 9.2*  ALBUMIN 3.7   No results found for this basename: LIPASE, AMYLASE,  in the last 168 hours No results found for this basename: AMMONIA,  in the last 168 hours CBC:  Recent Labs Lab 09/09/13 1711  09/10/13 1000 09/11/13 0525  WBC 12.8* 11.3* 7.8  NEUTROABS 10.7*  --   --   HGB 15.6 13.7 13.8  HCT 43.9 38.3* 38.4*  MCV 89.0 89.3 88.5  PLT 167 146* 128*   Cardiac Enzymes: No results found for this basename: CKTOTAL, CKMB, CKMBINDEX, TROPONINI,  in the last 168 hours BNP (last 3 results) No results found for this basename: PROBNP,  in the last 8760 hours CBG: No results found for this basename: GLUCAP,  in the last 168 hours  Recent Results (from the past 240 hour(s))  CULTURE, BLOOD (ROUTINE X 2)     Status: None   Collection Time    09/09/13  4:48 PM      Result Value Range Status   Specimen Description BLOOD HAND RIGHT   Final   Special Requests BOTTLES DRAWN AEROBIC ONLY 2.5CC   Final   Culture  Setup Time     Final   Value: 09/09/2013 22:43     Performed at [facility]   Culture     Final   Value:        BLOOD CULTURE RECEIVED NO GROWTH TO DATE CULTURE WILL BE HELD FOR 5 DAYS BEFORE ISSUING A FINAL NEGATIVE REPORT     Performed at  [facility]   Report Status PENDING   Incomplete  CULTURE, BLOOD (ROUTINE X 2)     Status: None   Collection Time    09/09/13  6:31 PM      Result Value Range Status   Specimen Description BLOOD HAND RIGHT   Final   Special Requests BOTTLES DRAWN AEROBIC ONLY 2CC   Final   Culture  Setup Time     Final   Value: 09/10/2013 01:23     Performed at [facility]   Culture     Final   Value:        BLOOD CULTURE RECEIVED NO GROWTH TO DATE CULTURE WILL BE HELD FOR 5 DAYS BEFORE ISSUING A FINAL NEGATIVE REPORT     Performed at [facility]   Report Status PENDING   Incomplete  CLOSTRIDIUM DIFFICILE BY PCR     Status: None   Collection Time    09/11/13  3:28 AM      Result Value Range Status   C difficile by pcr NEGATIVE  NEGATIVE Final     Studies: Dg Chest Port 1 View  09/09/2013   CLINICAL DATA:  Fever  EXAM: PORTABLE CHEST - 1 VIEW  COMPARISON:  05/03/2013  FINDINGS: A right-sided dialysis catheter  is appreciated unchanged when compared to previous study. The cardiac silhouette is mild-to-moderately enlarged. There is prominence of the interstitial markings without peribronchial cuffing. No focal region of consolidation or focal infiltrates are appreciated. EKG leads are appreciated within the right left hemithoraces. The visualized osseous structures are grossly unremarkable.  IMPRESSION: 1. Pulmonary vascular congestion/mild edema 2. No focal regions of consolidation or focal infiltrates.   Electronically Signed   By: Margaree Mackintosh M.D.   On: 09/09/2013 15:33    Scheduled Meds: . cinacalcet  60 mg Oral QAC breakfast  . famotidine  20 mg Oral Daily  . heparin  5,000 Units Subcutaneous Q8H  . multivitamin  1 tablet Oral QHS  . sevelamer carbonate  1,600-2,400 mg Oral TID WC   Continuous Infusions:   Active Problems:   End stage renal disease   Fever in adult   SIRS (systemic inflammatory response syndrome)    Time spent: 22min    [facility] Pager [phone]. If 7PM-7AM, please contact night-coverage at [email or web address], password [facility] 09/11/2013, 2:02 PM  LOS: 2 days

## 2013-09-11 NOTE — Progress Notes (Signed)
Utilization review completed.  

## 2013-09-11 NOTE — Progress Notes (Signed)
ANTIBIOTIC CONSULT NOTE - Follow up  Pharmacy Consult for Vancomycin Indication: Gram positive bacteremia   No Known Allergies  Patient Measurements: Height: 5\' 11"  (180.3 cm) Weight: 312 lb 9.6 oz (141.794 kg) IBW/kg (Calculated) : 75.3 Adjusted Body Weight: n/a  Vital Signs: Temp: 98.2 F (36.8 C) (11/19 1000) Temp src: Oral (11/19 1000) BP: 122/61 mmHg (11/19 1000) Pulse Rate: 98 (11/19 1000) Intake/Output from previous day: 11/18 0701 - 11/19 0700 In: 12250 [P.O.:12250] Out: 0  Intake/Output from this shift: Total I/O In: 240 [P.O.:240] Out: -   Labs:  Recent Labs  09/09/13 1711 09/10/13 1000 09/11/13 0525  WBC 12.8* 11.3* 7.8  HGB 15.6 13.7 13.8  PLT 167 146* 128*  CREATININE 10.53* 14.59* 17.73*   Estimated Creatinine Clearance: 7.7 ml/min (by C-G formula based on Cr of 17.73). No results found for this basename: VANCOTROUGH, Corlis Leak, VANCORANDOM, Cornelius, GENTPEAK, [place], [place], [place], TOBRARND, AMIKACINPEAK, AMIKACINTROU, AMIKACIN,  in the last 72 hours   Microbiology: Recent Results (from the past 720 hour(s))  CULTURE, BLOOD (ROUTINE X 2)     Status: None   Collection Time    09/09/13  4:48 PM      Result Value Range Status   Specimen Description BLOOD HAND RIGHT   Final   Special Requests BOTTLES DRAWN AEROBIC ONLY 2.5CC   Final   Culture  Setup Time     Final   Value: 09/09/2013 22:43     Performed at [facility]   Culture     Final   Value: [place] IN CLUSTERS     Note: Gram Stain Report Called to,Read Back By and Verified With: CY HICKS 09/11/13 1415 BY SMITHERSJ     Performed at [facility]   Report Status PENDING   Incomplete  CULTURE, BLOOD (ROUTINE X 2)     Status: None   Collection Time    09/09/13  6:31 PM      Result Value Range Status   Specimen Description BLOOD HAND RIGHT   Final   Special Requests BOTTLES DRAWN AEROBIC ONLY 2CC   Final   Culture  Setup Time     Final   Value:  09/10/2013 01:23     Performed at [facility]   Culture     Final   Value: [place] IN CLUSTERS     Note: Gram Stain Report Called to,Read Back By and Verified With: CY HICKS 09/11/13 1415 BY SMITHERSJ     Performed at [facility]   Report Status PENDING   Incomplete  CLOSTRIDIUM DIFFICILE BY PCR     Status: None   Collection Time    09/11/13  3:28 AM      Result Value Range Status   C difficile by pcr NEGATIVE  NEGATIVE Final    Medical History: Past Medical History  Diagnosis Date  . Chronic kidney disease   . Hypertension   . Hemodialysis finding May 2 ,2005    First HD at [facility].  . Anemia     Iron deficiency  . End stage renal disease on dialysis   . Aneurysm 02/15/2007    Right arm large aneurysm  . GERD (gastroesophageal reflux disease)     Medications:  Prescriptions prior to admission  Medication Sig Dispense Refill  . Acetaminophen (TYLENOL EXTRA STRENGTH PO) Take 2 tablets by mouth once as needed (headaches).      Marland Kitchen b complex-vitamin c-folic acid (NEPHRO-VITE) 0.8 MG  TABS Take 0.8 mg by mouth daily.      . diphenhydrAMINE (BENADRYL) 25 MG tablet Take 25 mg by mouth every 6 (six) hours as needed for itching.      . midodrine (PROAMATINE) 5 MG tablet Take 5 mg by mouth as needed. Take 1 capsule in the morning prior to dialysis to increase blood pressure      . ranitidine (ZANTAC) 150 MG capsule Take 150 mg by mouth daily as needed for heartburn. For heartburn      . sevelamer (RENVELA) 800 MG tablet Take 1,600-2,400 mg by mouth 3 (three) times daily with meals. Takes either 1600mg  or 2400mg  with each meal depending on the size of the meal      . cinacalcet (SENSIPAR) 60 MG tablet Take 60 mg by mouth daily.      Marland Kitchen doxycycline (DORYX) 100 MG EC tablet Take 100 mg by mouth 2 (two) times daily. Pt completed course 2 weeks ago       Assessment: [name] was brought to ER from the dialysis center in [place] after he began to have  chills, fever and sweats during his dialysis session. On vancomycin for gram positive bacteremia with AVF as possible source. TTE shows no vegetation. WBC 7.8 (trending down), Max temp yesterday 101.4, currently afebrile.   Cultures: 11/19 Blood Cx x 2 Gram positive cocci in clusters  Goal of Therapy:  Pre-HD vanc level: 15-25   Plan:  1) Vancomycin 1000 mg IV Q HD on MWF 2) Monitor CBC, cultures, and patient clinical status  3) Collect levels when appropriate   Albertina Parr, PharmD.  Clinical Pharmacist Pager [phone]

## 2013-09-11 NOTE — Consult Note (Signed)
[facility] Renal Consultation Note    Indication for Consultation:  Management of ESRD/hemodialysis; anemia, hypertension/volume and secondary hyperparathyroidism  HPI: [name] is a 42 y.o. male ESRD patient (MWF HD) with past medical history significant for hypotension who reports that he was in his usual state of health until he began experiencing fever and chills on hemodialysis on Monday. His temperature was documented at 102.1. Blood cultures were collected (now positive for S. aureaus), he was given IV vancomycin and Fortaz and transported to the ED for further evaluation. He has a right I-J tunneled dialysis catheter as well as a maturing left forearm AV fistula which was recently treated for positive wound cultures (also S. Aureus). At the time of this encounter, he is resting in bed, complaining only diarrhea and poor appetite over the past 2 days. Stool cultures are C. Diff negative and TTE is negative for vegetation. He denies HA, dizziness, nausea, vomiting, or pain.  Past Medical History  Diagnosis Date  . Chronic kidney disease   . Hypertension   . Hemodialysis finding May 2 ,2005    First HD at [facility].  . Anemia     Iron deficiency  . End stage renal disease on dialysis   . Aneurysm 02/15/2007    Right arm large aneurysm  . GERD (gastroesophageal reflux disease)    Past Surgical History  Procedure Laterality Date  . Ligation of arteriovenous  fistula Right 04/22/2013    Procedure: LIGATION OF ARTERIOVENOUS  FISTULA  AND RESECTION OF VENOUS ANEURYSM AND ABSCESS;  Surgeon: Rosetta Posner, MD;  Location: [place];  Service: Vascular;  Laterality: Right;  . Insertion of dialysis catheter Right 04/22/2013    Procedure: INSERTION OF DIALYSIS CATHETER;  Surgeon: Rosetta Posner, MD;  Location: [place];  Service: Vascular;  Laterality: Right;  . Av fistula placement    . Colonscopy    . Colonoscopy      Hxl: of  . Av fistula placement Left 05/03/2013     Procedure: ARTERIOVENOUS (AV) FISTULA CREATION;  Surgeon: Rosetta Posner, MD;  Location: [place];  Service: Vascular;  Laterality: Left;  . Fistula superficialization Left 07/24/2013    Procedure: FISTULA SUPERFICIALIZATION AND LIGATION OF BRANCHES;  Surgeon: Rosetta Posner, MD;  Location: [facility] OR;  Service: Vascular;  Laterality: Left;   Family History  Problem Relation Age of Onset  . Hypertension Mother   . Heart disease Mother     before age 96  . Other Brother     DVT   Social History:  reports that he has been smoking Cigars.  He has never used smokeless tobacco. He reports that he drinks alcohol. He reports that he does not use illicit drugs. No Known Allergies Prior to Admission medications   Medication Sig Start Date End Date Taking? Authorizing Provider  Acetaminophen (TYLENOL EXTRA STRENGTH PO) Take 2 tablets by mouth once as needed (headaches).   Yes Historical Provider, MD  b complex-vitamin c-folic acid (NEPHRO-VITE) 0.8 MG TABS Take 0.8 mg by mouth daily.   Yes Historical Provider, MD  diphenhydrAMINE (BENADRYL) 25 MG tablet Take 25 mg by mouth every 6 (six) hours as needed for itching.   Yes Historical Provider, MD  midodrine (PROAMATINE) 5 MG tablet Take 5 mg by mouth as needed. Take 1 capsule in the morning prior to dialysis to increase blood pressure   Yes Historical Provider, MD  ranitidine (ZANTAC) 150 MG capsule Take 150 mg by mouth  daily as needed for heartburn. For heartburn   Yes Historical Provider, MD  sevelamer (RENVELA) 800 MG tablet Take 1,600-2,400 mg by mouth 3 (three) times daily with meals. Takes either 1600mg  or 2400mg  with each meal depending on the size of the meal   Yes Historical Provider, MD  cinacalcet (SENSIPAR) 60 MG tablet Take 60 mg by mouth daily.    Historical Provider, MD  doxycycline (DORYX) 100 MG EC tablet Take 100 mg by mouth 2 (two) times daily. Pt completed course 2 weeks ago    Historical Provider, MD   Current Facility-Administered Medications   Medication Dose Route Frequency Provider Last Rate Last Dose  . cinacalcet (SENSIPAR) tablet 60 mg  60 mg Oral QAC breakfast Charlynne Cousins, MD   60 mg at 09/11/13 [id number]  . diphenhydrAMINE (BENADRYL) capsule 25 mg  25 mg Oral Q6H PRN Charlynne Cousins, MD      . famotidine (PEPCID) tablet 20 mg  20 mg Oral Daily Charlynne Cousins, MD   20 mg at 09/11/13 1212  . heparin injection 5,000 Units  5,000 Units Subcutaneous Q8H Charlynne Cousins, MD   5,000 Units at 09/11/13 [phone]  . multivitamin (RENA-VIT) tablet 1 tablet  1 tablet Oral QHS Charlynne Cousins, MD   1 tablet at 09/10/13 2125  . ondansetron (ZOFRAN) tablet 4 mg  4 mg Oral Q6H PRN Charlynne Cousins, MD       Or  . ondansetron [facility]) injection 4 mg  4 mg Intravenous Q6H PRN Charlynne Cousins, MD      . polyethylene glycol [facility] Raveling) packet 17 g  17 g Oral Daily PRN Charlynne Cousins, MD      . sevelamer carbonate (RENVELA) tablet 1,600-2,400 mg  1,600-2,400 mg Oral TID WC Charlynne Cousins, MD   1,800 mg at 09/11/13 1212   Labs: Basic Metabolic Panel:  Recent Labs Lab 09/09/13 1711 09/10/13 1000 09/11/13 0525  NA 136  --  131*  K 4.4  --  4.6  CL 94*  --  92*  CO2 24  --  20  GLUCOSE 72  --  87  BUN 23  --  52*  CREATININE 10.53* 14.59* 17.73*  CALCIUM 10.2  --  9.5   Liver Function Tests:  Recent Labs Lab 09/09/13 1711  AST 21  ALT 10  ALKPHOS 62  BILITOT 1.0  PROT 9.2*  ALBUMIN 3.7   No results found for this basename: LIPASE, AMYLASE,  in the last 168 hours No results found for this basename: AMMONIA,  in the last 168 hours CBC:  Recent Labs Lab 09/09/13 1711 09/10/13 1000 09/11/13 0525  WBC 12.8* 11.3* 7.8  NEUTROABS 10.7*  --   --   HGB 15.6 13.7 13.8  HCT 43.9 38.3* 38.4*  MCV 89.0 89.3 88.5  PLT 167 146* 128*   Cardiac Enzymes: No results found for this basename: CKTOTAL, CKMB, CKMBINDEX, TROPONINI,  in the last 168 hours CBG: No results found for this basename:  GLUCAP,  in the last 168 hours INR: @labcntip (inr:3)@ Iron Studies: No results found for this basename: IRON, TIBC, TRANSFERRIN, FERRITIN,  in the last 72 hours Studies/Results: Dg Chest Port 1 View  09/09/2013   CLINICAL DATA:  Fever  EXAM: PORTABLE CHEST - 1 VIEW  COMPARISON:  05/03/2013  FINDINGS: A right-sided dialysis catheter is appreciated unchanged when compared to previous study. The cardiac silhouette is mild-to-moderately enlarged. There is prominence of the interstitial markings  without peribronchial cuffing. No focal region of consolidation or focal infiltrates are appreciated. EKG leads are appreciated within the right left hemithoraces. The visualized osseous structures are grossly unremarkable.  IMPRESSION: 1. Pulmonary vascular congestion/mild edema 2. No focal regions of consolidation or focal infiltrates.   Electronically Signed   By: Margaree Mackintosh M.D.   On: 09/09/2013 15:33    ROS: + Diarrhea. 10 pt ROS asked and answered. All systems negative except as in the HPI above   Physical Exam: Filed Vitals:   09/10/13 1400 09/10/13 1817 09/10/13 2157 09/11/13 1000  BP: 108/75 100/61 117/52 122/61  Pulse: 102 94 108 98  Temp: 98.4 F (36.9 C) 98.2 F (36.8 C) 98.9 F (37.2 C) 98.2 F (36.8 C)  TempSrc: Oral Oral Oral Oral  Resp:   18 18  Height:      Weight:   141.794 kg (312 lb 9.6 oz)   SpO2: 97% 98% 99% 98%     General: Well developed, well nourished, in no acute distress. Head: Normocephalic, atraumatic, sclera non-icteric, mucus membranes are moist Neck: Supple. JVD not elevated. Lungs: Clear bilaterally to auscultation without wheezes, rales, or rhonchi. Breathing is unlabored. Heart: RRR with S1 S2. No murmurs, rubs, or gallops appreciated. Abdomen: Obese, non-tender, non-distended with normoactive bowel sounds. No rebound/guarding. No obvious abdominal masses. M-S:  Strength and tone appear normal for age. Lower extremities:without edema or ischemic changes,  no open wounds  Neuro: Alert and oriented X 3. Moves all extremities spontaneously. Psych:  Responds to questions appropriately with a normal affect. Dialysis Access: Rt I-J TDC/ Maturing LFA AVF + bruit, small healing area of ulceration, no erythema, tenderness or drainage.  Dialysis Orders: Center: [facility]  on MWF . EDW 143 HD Bath 1K/2.5Ca  Time 5:00 Heparin 10000. Access TDC/ Maturing L AVFBFR 400 DFR A1.5    Hectorol 6 mcg IV/HD Epogen 0   Units IV/HD  Venofer  0  Recent Labs: Hgb 13.9, Tsat 40%, P 8.7, PTH 866, K 5.1. Positive blood cultures collected 11/17 - gram positive cocci in clusters  Assessment/Plan: 1. Staph Aureus Bacteremia - Per op blood cultures drawn 11/17. Vanc started at op center and continued here per pharmacy. Repeat BC's pending. TTE show no vegetation. AVF possibly original source of infection (See #2 below). Will consult with Dr Jonnie Finner regarding possible removal of catheter after dialysis today. 2. HD Access - Using [facility]. Maturing L AVF recently treated for area of purulent drainage.(Op wound cx positive for S. Aureus on 10/31). Completed course of Doxy. Per [facility] OV notes on 11/4, site healing well and AVF ready for use in about a month (~1st week in Dec) 3. Diarrhea - C Diff negative. Mgmt per primary  4. ESRD -  MWF, K+ 4.6 HD today 5. Hypertension/volume  - SBPs 120s. Known hypotension on HD. Midodrine 5 mg pre-tx. Below edw here by weights. Pulm vasc congestion on CXR. UF 3L as tolerated. 6. Anemia  - Hgb 13.8 consistent with op labs. No ESAs. Last Tsat 40%. No op Fe. 7. Metabolic bone disease -  Ca 9.5. P poorly controlled op. Last PTH 866. Continue Renvela, Vit D and Sensipar 60 8. Nutrition - Change to renal diet, add multivitamin  Sonnie Alamo, PA-C [facility] Pager [phone] 09/11/2013, 2:10 PM    I have seen and examined patient, discussed with PA and agree with assessment and plan as outlined above.  Pleasant adult male with ESRD on HD for  over 2 years,  was here in June with abcess on right upper arm AVF and underwent resection of aneurysm and fistula abcess with R neck cath placement.  2 wks later in July had placement of L wrist Cimino fistula.  On Oct 1st had superificialization of the fistula with ligation of competing branches by Dr Donnetta Hutching.  2 days ago had chills at op hd and was admitted later that evening.  The op blood cx's are + for S Aureus. On exam the cath is not grossly infected, the old R arm fistula is clean and the L arm active fistula has no active infection.  Course c/w cath-related sepsis, rec's as above, will need HD cath removed tomorrow after HD tonight. Will follow. Kelly Splinter MD pager [phone]    cell [phone] 09/11/2013, 4:56 PM

## 2013-09-11 NOTE — Progress Notes (Signed)
   CARE MANAGEMENT NOTE 09/11/2013  Patient:  [name], [name]   Account Number:  [id number]  Date Initiated:  09/11/2013  Documentation initiated by:  Lizabeth Leyden  Subjective/Objective Assessment:   admitted from dialysis ctr with temp 102.9,diarrhea,  ? infected graft,     Action/Plan:   progression of care and discharge  planning   Anticipated DC Date:     Anticipated DC Plan:  HOME/SELF CARE         Choice offered to / List presented to:             Status of service:  In process, will continue to follow Medicare Important Message given?   (If response is "NO", the following Medicare IM given date fields will be blank) Date Medicare IM given:   Date Additional Medicare IM given:    Discharge Disposition:    Per UR Regulation:  Reviewed for med. necessity/level of care/duration of stay  If discussed at [place] of Stay Meetings, dates discussed:    Comments:

## 2013-09-11 NOTE — Significant Event (Signed)
CRITICAL VALUE ALERT  Critical value received:  Gram positive cocci in clusters   Date of notification:  09/11/13  Time of notification:  [id number]  Critical value read back:yes  Nurse who received alert:  Dudley Major  MD notified (1st page):  Dr. Broadus John  Time of first page:  [phone]  MD notified (2nd page):  Time of second page:  Responding MD:  Dr. Oswaldo Milian, RN

## 2013-09-12 DIAGNOSIS — I120 Hypertensive chronic kidney disease with stage 5 chronic kidney disease or end stage renal disease: Secondary | ICD-10-CM | POA: Diagnosis not present

## 2013-09-12 DIAGNOSIS — N186 End stage renal disease: Secondary | ICD-10-CM | POA: Diagnosis not present

## 2013-09-12 DIAGNOSIS — T827XXA Infection and inflammatory reaction due to other cardiac and vascular devices, implants and grafts, initial encounter: Secondary | ICD-10-CM | POA: Diagnosis not present

## 2013-09-12 DIAGNOSIS — T82898A Other specified complication of vascular prosthetic devices, implants and grafts, initial encounter: Secondary | ICD-10-CM

## 2013-09-12 DIAGNOSIS — A4901 Methicillin susceptible Staphylococcus aureus infection, unspecified site: Secondary | ICD-10-CM

## 2013-09-12 DIAGNOSIS — A419 Sepsis, unspecified organism: Secondary | ICD-10-CM | POA: Diagnosis not present

## 2013-09-12 DIAGNOSIS — R112 Nausea with vomiting, unspecified: Secondary | ICD-10-CM | POA: Diagnosis not present

## 2013-09-12 DIAGNOSIS — R6510 Systemic inflammatory response syndrome (SIRS) of non-infectious origin without acute organ dysfunction: Secondary | ICD-10-CM | POA: Diagnosis not present

## 2013-09-12 DIAGNOSIS — R7881 Bacteremia: Secondary | ICD-10-CM

## 2013-09-12 DIAGNOSIS — A4101 Sepsis due to Methicillin susceptible Staphylococcus aureus: Secondary | ICD-10-CM | POA: Diagnosis not present

## 2013-09-12 LAB — BASIC METABOLIC PANEL
BUN: 26 mg/dL — ABNORMAL HIGH (ref 6–23)
CO2: 25 mEq/L (ref 19–32)
Calcium: 9.4 mg/dL (ref 8.4–10.5)
Creatinine, Ser: 11.46 mg/dL — ABNORMAL HIGH (ref 0.50–1.35)
GFR calc Af Amer: 5 mL/min — ABNORMAL LOW (ref >90)
Glucose, Bld: 86 mg/dL (ref 70–99)
Potassium: 4.1 mEq/L (ref 3.5–5.1)

## 2013-09-12 LAB — CBC
HCT: 38.2 % — ABNORMAL LOW (ref 39.0–52.0)
MCH: 31.4 pg (ref 26.0–34.0)
MCV: 88.8 fL (ref 78.0–100.0)
Platelets: 151 10*3/uL (ref 150–400)
RDW: 14.5 % (ref 11.5–15.5)

## 2013-09-12 MED ORDER — SODIUM CHLORIDE 0.9 % IV SOLN
300.0000 mg | INTRAVENOUS | Status: DC
  Administered 2013-09-12: 300 mg via INTRAVENOUS
  Filled 2013-09-12 (×2): qty 300

## 2013-09-12 MED ORDER — CEFAZOLIN SODIUM-DEXTROSE 2-3 GM-% IV SOLR
2.0000 g | Freq: Once | INTRAVENOUS | Status: AC
  Administered 2013-09-12: 2 g via INTRAVENOUS
  Filled 2013-09-12: qty 50

## 2013-09-12 MED ORDER — CEFAZOLIN SODIUM-DEXTROSE 2-3 GM-% IV SOLR
2.0000 g | INTRAVENOUS | Status: DC
  Filled 2013-09-12: qty 50

## 2013-09-12 NOTE — Progress Notes (Addendum)
[facility] PROGRESS NOTE  [name] [id number] DOB: 1971-05-11 DOA: 09/09/2013 PCP: Ulla Potash., MD  Assessment/Plan: 1. Staph bacteremia/Sepsis - suspect HD catheter related infection, improving - continue IV vanc, Fu cultures - For HD catheter removal today - check 2D ECHO - repeat Blood Cx  2. Diarrhea -suspect related to sepsis, abx -improving -Cdiff pCR negative  3. ESRD on HD MWF -s/p  HD yetserday -appreciate  Renal input -has AVF in LUE which is was placed July 11  Code Status: Full Family Communication: none at bedside Disposition Plan: home when improved   Consultants:  Renal  Antibiotics:  vancomycin  HPI/Subjective: Feels much better on Abx  Objective: Filed Vitals:   09/12/13 1259  BP: 127/73  Pulse: 105  Temp: 98.7 F (37.1 C)  Resp: 21    Intake/Output Summary (Last 24 hours) at 09/12/13 1357 Last data filed at 09/12/13 1300  Gross per 24 hour  Intake    360 ml  Output   3000 ml  Net  -2640 ml   Filed Weights   09/10/13 2157 09/11/13 1745 09/11/13 2250  Weight: 141.794 kg (312 lb 9.6 oz) 144.3 kg (318 lb 2 oz) 141.5 kg (311 lb 15.2 oz)    Exam:   General: AAOx3  Cardiovascular: S1s2/RRR  Chest: HD catheter noted  Respiratory: CTAB  Abdomen: soft, NT, BS present  Musculoskeletal: no edema c/c   Data Reviewed: Basic Metabolic Panel:  Recent Labs Lab 09/09/13 1711 09/10/13 1000 09/11/13 0525 09/12/13 0600  NA 136  --  131* 134*  K 4.4  --  4.6 4.1  CL 94*  --  92* 96  CO2 24  --  20 25  GLUCOSE 72  --  87 86  BUN 23  --  52* 26*  CREATININE 10.53* 14.59* 17.73* 11.46*  CALCIUM 10.2  --  9.5 9.4  PHOS  --   --  4.9*  --    Liver Function Tests:  Recent Labs Lab 09/09/13 1711  AST 21  ALT 10  ALKPHOS 62  BILITOT 1.0  PROT 9.2*  ALBUMIN 3.7   No results found for this basename: LIPASE, AMYLASE,  in the last 168 hours No results found for this basename: AMMONIA,  in the last 168  hours CBC:  Recent Labs Lab 09/09/13 1711 09/10/13 1000 09/11/13 0525 09/12/13 0600  WBC 12.8* 11.3* 7.8 5.8  NEUTROABS 10.7*  --   --   --   HGB 15.6 13.7 13.8 13.5  HCT 43.9 38.3* 38.4* 38.2*  MCV 89.0 89.3 88.5 88.8  PLT 167 146* 128* 151   Cardiac Enzymes: No results found for this basename: CKTOTAL, CKMB, CKMBINDEX, TROPONINI,  in the last 168 hours BNP (last 3 results) No results found for this basename: PROBNP,  in the last 8760 hours CBG: No results found for this basename: GLUCAP,  in the last 168 hours  Recent Results (from the past 240 hour(s))  CULTURE, BLOOD (ROUTINE X 2)     Status: None   Collection Time    09/09/13  4:48 PM      Result Value Range Status   Specimen Description BLOOD HAND RIGHT   Final   Special Requests BOTTLES DRAWN AEROBIC ONLY 2.5CC   Final   Culture  Setup Time     Final   Value: 09/09/2013 22:43     Performed at [facility]   Culture     Final   Value: STAPHYLOCOCCUS AUREUS  Note: RIFAMPIN AND GENTAMICIN SHOULD NOT BE USED AS SINGLE DRUGS FOR TREATMENT OF STAPH INFECTIONS.     Note: Gram Stain Report Called to,Read Back By and Verified With: CY HICKS 09/11/13 1415 BY SMITHERSJ     Performed at [facility]   Report Status PENDING   Incomplete  CULTURE, BLOOD (ROUTINE X 2)     Status: None   Collection Time    09/09/13  6:31 PM      Result Value Range Status   Specimen Description BLOOD HAND RIGHT   Final   Special Requests BOTTLES DRAWN AEROBIC ONLY 2CC   Final   Culture  Setup Time     Final   Value: 09/10/2013 01:23     Performed at [facility]   Culture     Final   Value: STAPHYLOCOCCUS AUREUS     Note: Gram Stain Report Called to,Read Back By and Verified With: CY HICKS 09/11/13 1415 BY SMITHERSJ     Performed at [facility]   Report Status PENDING   Incomplete  CLOSTRIDIUM DIFFICILE BY PCR     Status: None   Collection Time    09/11/13  3:28 AM      Result Value Range Status   C  difficile by pcr NEGATIVE  NEGATIVE Final  MRSA PCR SCREENING     Status: None   Collection Time    09/11/13  1:04 PM      Result Value Range Status   MRSA by PCR NEGATIVE  NEGATIVE Final   Comment:            The GeneXpert MRSA Assay (FDA     approved for NASAL specimens     only), is one component of a     comprehensive MRSA colonization     surveillance program. It is not     intended to diagnose MRSA     infection nor to guide or     monitor treatment for     MRSA infections.     Studies: No results found.  Scheduled Meds: . [START ON 09/13/2013]  ceFAZolin (ANCEF) IV  2 g Intravenous Q M,W,F-2000  . cinacalcet  60 mg Oral QAC breakfast  . famotidine  20 mg Oral Daily  . heparin  5,000 Units Subcutaneous Q8H  . midodrine  5 mg Oral Q M,W,F-HD  . multivitamin  1 tablet Oral QHS  . sevelamer carbonate  3,200 mg Oral TID WC  . vancomycin  1,000 mg Intravenous Q M,W,F-HD   Continuous Infusions:   Active Problems:   End stage renal disease   Fever in adult   SIRS (systemic inflammatory response syndrome)    Time spent: 62min    [facility] Pager [phone]. If 7PM-7AM, please contact night-coverage at [email or web address], password [facility] 09/12/2013, 1:57 PM  LOS: 3 days

## 2013-09-12 NOTE — H&P (Signed)
[facility] SHORT STAY H&P  CC:  Catheter removal   HPI:  This is a 42 y.o. male here for diatek catheter removal.  The patient was admitted for fever workup.  Blood culture grew Staphylococcus Aureus 09/09/2013.  We have been ask to remove the right diatek secondary to possible source of infection.  He has a left forearm AV fistula that was superficialized in 07/24/2013.  He had an area of drainage in his mid forearm that has healed.  He has no fever or chills, dizziness, SOB.       Past Medical History  Diagnosis Date  . Chronic kidney disease   . Hypertension   . Hemodialysis finding May 2 ,2005    First HD at [facility].  . Anemia     Iron deficiency  . End stage renal disease on dialysis   . Aneurysm 02/15/2007    Right arm large aneurysm  . GERD (gastroesophageal reflux disease)     FH:Mother HTN,CAD  History   Social History  . Marital Status: Single    Spouse Name: N/A    Number of Children: N/A  . Years of Education: N/A   Occupational History  . Not on file.   Social History Main Topics  . Smoking status: Light Tobacco Smoker    Types: Cigars  . Smokeless tobacco: Never Used     Comment: pt states he smokes about 3 cigars per year  . Alcohol Use: Yes     Comment: 3 drinks a year  . Drug Use: No     Comment: occasional  . Sexual Activity: Yes   Other Topics Concern  . Not on file   Social History Narrative  . No narrative on file    No Known Allergies  Current Facility-Administered Medications  Medication Dose Route Frequency Provider Last Rate Last Dose  . ceFAZolin (ANCEF) IVPB 2 g/50 mL premix  2 g Intravenous Once Rolla Flatten, [facility]      . [START ON 09/13/2013] ceFAZolin (ANCEF) IVPB 2 g/50 mL premix  2 g Intravenous Q M,W,F-2000 Rolla Flatten, [facility]      . cinacalcet (SENSIPAR) tablet 60 mg  60 mg Oral QAC breakfast Charlynne Cousins, MD   60 mg at 09/12/13 0825  . diphenhydrAMINE (BENADRYL) capsule 25 mg  25  mg Oral Q6H PRN Charlynne Cousins, MD   25 mg at 09/11/13 1756  . famotidine (PEPCID) tablet 20 mg  20 mg Oral Daily Charlynne Cousins, MD   20 mg at 09/11/13 1212  . heparin injection 5,000 Units  5,000 Units Subcutaneous Q8H Charlynne Cousins, MD   5,000 Units at 09/12/13 [phone]  . loperamide (IMODIUM) capsule 2 mg  2 mg Oral PRN Domenic Polite, MD   2 mg at 09/11/13 1757  . midodrine (PROAMATINE) tablet 5 mg  5 mg Oral Q M,W,F-HD Alvia Grove, PA-C   5 mg at 09/11/13 1714  . multivitamin (RENA-VIT) tablet 1 tablet  1 tablet Oral QHS Charlynne Cousins, MD   1 tablet at 09/11/13 2334  . ondansetron (ZOFRAN) tablet 4 mg  4 mg Oral Q6H PRN Charlynne Cousins, MD       Or  . ondansetron [facility]) injection 4 mg  4 mg Intravenous Q6H PRN Charlynne Cousins, MD      . polyethylene glycol [facility] / Floria Raveling) packet 17 g  17 g Oral Daily PRN Charlynne Cousins, MD      .  sevelamer carbonate (RENVELA) tablet 3,200 mg  3,200 mg Oral TID WC Alvia Grove, PA-C   3,200 mg at 09/12/13 0825  . vancomycin (VANCOCIN) IVPB 1000 mg/200 mL premix  1,000 mg Intravenous Q M,W,F-HD [name] Mancheril, RPH   1,000 mg at 09/11/13 2227    ROS:  See HPI  PHYSICAL EXAM  Filed Vitals:   09/12/13 1018  BP: 134/74  Pulse: 86  Temp: 97.9 F (36.6 C)  Resp: 30    Gen:  Well developed well nourished HEENT:  normocephalic Neck:  Palpable right IJ Heart:  RRR Lungs:  Non-labored Abdomen:  Non distended Extremities:  Left forearm palpable AV fistula thrill healed incisions, no drainage Skin:  No obvious rashes Diatek exit site has evidence of purulent drainage.    Impression: This is a 42 y.o. male here for diatek catheter removal   Plan:  Removal of right diatek catheter We recommend using the left forearm to dialysis at this time. Call us for further assistance as needed.   Laurence Slate [facility] PA-C [facility] [phone] 09/12/2013 11:10 AM

## 2013-09-12 NOTE — Progress Notes (Signed)
[facility] Catheter Removal Procedure Note  Diagnosis: ESRD with Functioning AVF/AVGG  Plan:  Remove right diatek catheter  Consent signed:  yes Time out completed:  yes Coumadin:  no PT/INR (if applicable):   Other labs:   Procedure: 1.  Sterile prepping and draping over catheter area 2. 0 ml 2% lidocaine plain instilled at removal site. 3.  right catheter removed in its entirety with cuff in tact. 4.  Complications: none  5. Tip of catheter sent for culture:  yes   Patient tolerated procedure well:  yes Pressure held, no bleeding noted, dressing applied Instructions given to the pt regarding wound care and bleeding.  OtherLaurence Slate [facility] 09/12/2013 11:29 AM

## 2013-09-12 NOTE — Consult Note (Signed)
INFECTIOUS DISEASE CONSULT NOTE  Date of Admission:  09/09/2013  Date of Consult:  09/12/2013  Reason for Consult: Staph bacteremia Referring Physician: Broadus John  Impression/Recommendation Staph Bacteremia HD catheter infection  Would Await sensi of staph (typically not reported S aureus if MRSA but must await official report) Repeat BCx pending.  Get TEE HD line removed today Consider eval of fistula (echo or fistulogram) Consider eval of previous fistula site Add rifampin with presence of prosthetic (fistula) Check HIV if not done in past year (per CDC guidelines)  Comment- Pt is very high risk for infected AVG given that he had previous AVF abscess, stitch abscess and now bacteremia.   Thank you so much for this interesting consult,   Bobby Rumpf (pager) [phone] [email or web address]  [name] is an 41 y.o. male.  HPI: 42 yo M with hx of ESRD (secondary to HTN), previous AV fistula abscess 04-22-2013 (s aureus), wound Cx from stitch absces/AVG staph aureus (08-23-13). He was started on po (Pt cannot remember name). He was brought to Ed on 11-18 after developing fever 102.9 at HD. He was given vanco and fortaz, and had BCx drawn which have since grown staph aureus (sensi pending). He had a TTE 09-10-13 which did not show IE.   Past Medical History  Diagnosis Date  . Chronic kidney disease   . Hypertension   . Hemodialysis finding May 2 ,2005    First HD at [facility].  . Anemia     Iron deficiency  . End stage renal disease on dialysis   . Aneurysm 02/15/2007    Right arm large aneurysm  . GERD (gastroesophageal reflux disease)     Past Surgical History  Procedure Laterality Date  . Ligation of arteriovenous  fistula Right 04/22/2013    Procedure: LIGATION OF ARTERIOVENOUS  FISTULA  AND RESECTION OF VENOUS ANEURYSM AND ABSCESS;  Surgeon: Rosetta Posner, MD;  Location: [place];  Service: Vascular;  Laterality: Right;  . Insertion of dialysis catheter  Right 04/22/2013    Procedure: INSERTION OF DIALYSIS CATHETER;  Surgeon: Rosetta Posner, MD;  Location: [place];  Service: Vascular;  Laterality: Right;  . Av fistula placement    . Colonscopy    . Colonoscopy      Hxl: of  . Av fistula placement Left 05/03/2013    Procedure: ARTERIOVENOUS (AV) FISTULA CREATION;  Surgeon: Rosetta Posner, MD;  Location: [place];  Service: Vascular;  Laterality: Left;  . Fistula superficialization Left 07/24/2013    Procedure: FISTULA SUPERFICIALIZATION AND LIGATION OF BRANCHES;  Surgeon: Rosetta Posner, MD;  Location: [place];  Service: Vascular;  Laterality: Left;     No Known Allergies  Medications:  Scheduled: . [START ON 09/13/2013]  ceFAZolin (ANCEF) IV  2 g Intravenous Q M,W,F-2000  . cinacalcet  60 mg Oral QAC breakfast  . famotidine  20 mg Oral Daily  . heparin  5,000 Units Subcutaneous Q8H  . midodrine  5 mg Oral Q M,W,F-HD  . multivitamin  1 tablet Oral QHS  . sevelamer carbonate  3,200 mg Oral TID WC  . vancomycin  1,000 mg Intravenous Q M,W,F-HD    Total days of antibiotics: 3 (vanco/ancef)          Social History:  reports that he has been smoking Cigars.  He has never used smokeless tobacco. He reports that he drinks alcohol. He reports that he does not use illicit drugs.  Family History  Problem Relation Age of  Onset  . Hypertension Mother   . Heart disease Mother     before age 74  . Other Brother     DVT    General ROS: pt does not make urine, had loose Bm which have since resolved, denies change in wt, normal aptite, see HPI.  Blood pressure 127/73, pulse 105, temperature 98.7 F (37.1 C), temperature source Oral, resp. rate 21, height 5\' 11"  (1.803 m), weight 141.5 kg (311 lb 15.2 oz), SpO2 97.00%. General appearance: alert, cooperative and no distress Eyes: negative findings: pupils equal, round, reactive to light and accomodation, positive findings: conjunctiva: 3+ injection Throat: normal findings: oropharynx pink & moist without  lesions or evidence of thrush Neck: no adenopathy and supple, symmetrical, trachea midline Lungs: clear to auscultation bilaterally Chest wall: R chest os were prev HD line was inserted. no d/c, no fluctuance.  Heart: regular rate and rhythm Abdomen: normal findings: bowel sounds normal and soft, non-tender Extremities: edema none and LUE fistula- mod warmth, no fluctuance. RUE large scar, indurated area from previous AVF.    Results for orders placed during the hospital encounter of 09/09/13 (from the past 48 hour(s))  CLOSTRIDIUM DIFFICILE BY PCR     Status: None   Collection Time    09/11/13  3:28 AM      Result Value Range   C difficile by pcr NEGATIVE  NEGATIVE  CBC     Status: Abnormal   Collection Time    09/11/13  5:25 AM      Result Value Range   WBC 7.8  4.0 - 10.5 K/uL   RBC 4.34  4.22 - 5.81 MIL/uL   Hemoglobin 13.8  13.0 - 17.0 g/dL   HCT 38.4 (*) 39.0 - 52.0 %   MCV 88.5  78.0 - 100.0 fL   MCH 31.8  26.0 - 34.0 pg   MCHC 35.9  30.0 - 36.0 g/dL   RDW 14.5  11.5 - 15.5 %   Platelets 128 (*) 150 - 400 K/uL  BASIC METABOLIC PANEL     Status: Abnormal   Collection Time    09/11/13  5:25 AM      Result Value Range   Sodium 131 (*) 135 - 145 mEq/L   Potassium 4.6  3.5 - 5.1 mEq/L   Chloride 92 (*) 96 - 112 mEq/L   CO2 20  19 - 32 mEq/L   Glucose, Bld 87  70 - 99 mg/dL   BUN 52 (*) 6 - 23 mg/dL   Comment: DELTA CHECK NOTED   Creatinine, Ser 17.73 (*) 0.50 - 1.35 mg/dL   Calcium 9.5  8.4 - 10.5 mg/dL   GFR calc non Af Amer 3 (*) >90 mL/min   GFR calc Af Amer 3 (*) >90 mL/min   Comment: (NOTE)     The eGFR has been calculated using the CKD EPI equation.     This calculation has not been validated in all clinical situations.     eGFR's persistently <90 mL/min signify possible Chronic Kidney     Disease.  PHOSPHORUS     Status: Abnormal   Collection Time    09/11/13  5:25 AM      Result Value Range   Phosphorus 4.9 (*) 2.3 - 4.6 mg/dL  MRSA PCR SCREENING      Status: None   Collection Time    09/11/13  1:04 PM      Result Value Range   MRSA by PCR NEGATIVE  NEGATIVE  Comment:            The GeneXpert MRSA Assay (FDA     approved for NASAL specimens     only), is one component of a     comprehensive MRSA colonization     surveillance program. It is not     intended to diagnose MRSA     infection nor to guide or     monitor treatment for     MRSA infections.  CBC     Status: Abnormal   Collection Time    09/12/13  6:00 AM      Result Value Range   WBC 5.8  4.0 - 10.5 K/uL   RBC 4.30  4.22 - 5.81 MIL/uL   Hemoglobin 13.5  13.0 - 17.0 g/dL   HCT 38.2 (*) 39.0 - 52.0 %   MCV 88.8  78.0 - 100.0 fL   MCH 31.4  26.0 - 34.0 pg   MCHC 35.3  30.0 - 36.0 g/dL   RDW 14.5  11.5 - 15.5 %   Platelets 151  150 - 400 K/uL  BASIC METABOLIC PANEL     Status: Abnormal   Collection Time    09/12/13  6:00 AM      Result Value Range   Sodium 134 (*) 135 - 145 mEq/L   Potassium 4.1  3.5 - 5.1 mEq/L   Chloride 96  96 - 112 mEq/L   CO2 25  19 - 32 mEq/L   Glucose, Bld 86  70 - 99 mg/dL   BUN 26 (*) 6 - 23 mg/dL   Comment: DELTA CHECK NOTED   Creatinine, Ser 11.46 (*) 0.50 - 1.35 mg/dL   Comment: DELTA CHECK NOTED   Calcium 9.4  8.4 - 10.5 mg/dL   GFR calc non Af Amer 5 (*) >90 mL/min   GFR calc Af Amer 5 (*) >90 mL/min   Comment: (NOTE)     The eGFR has been calculated using the CKD EPI equation.     This calculation has not been validated in all clinical situations.     eGFR's persistently <90 mL/min signify possible Chronic Kidney     Disease.      Component Value Date/Time   SDES BLOOD HAND RIGHT 09/09/2013 1831   SPECREQUEST BOTTLES DRAWN AEROBIC ONLY [facility] 09/09/2013 1831   CULT  Value: STAPHYLOCOCCUS AUREUS Note: Gram Stain Report Called to,Read Back By and Verified With: CY HICKS 09/11/13 1415 BY SMITHERSJ Performed at [place] 09/09/2013 1831   REPTSTATUS PENDING 09/09/2013 1831   No results found. Recent Results (from  the past 240 hour(s))  CULTURE, BLOOD (ROUTINE X 2)     Status: None   Collection Time    09/09/13  4:48 PM      Result Value Range Status   Specimen Description BLOOD HAND RIGHT   Final   Special Requests BOTTLES DRAWN AEROBIC ONLY 2.5CC   Final   Culture  Setup Time     Final   Value: 09/09/2013 22:43     Performed at [facility]   Culture     Final   Value: STAPHYLOCOCCUS AUREUS     Note: RIFAMPIN AND GENTAMICIN SHOULD NOT BE USED AS SINGLE DRUGS FOR TREATMENT OF STAPH INFECTIONS.     Note: Gram Stain Report Called to,Read Back By and Verified With: CY HICKS 09/11/13 1415 BY SMITHERSJ     Performed at [facility]   Report Status PENDING   Incomplete  CULTURE, BLOOD (ROUTINE X 2)     Status: None   Collection Time    09/09/13  6:31 PM      Result Value Range Status   Specimen Description BLOOD HAND RIGHT   Final   Special Requests BOTTLES DRAWN AEROBIC ONLY 2CC   Final   Culture  Setup Time     Final   Value: 09/10/2013 01:23     Performed at [facility]   Culture     Final   Value: STAPHYLOCOCCUS AUREUS     Note: Gram Stain Report Called to,Read Back By and Verified With: CY HICKS 09/11/13 1415 BY SMITHERSJ     Performed at [facility]   Report Status PENDING   Incomplete  CLOSTRIDIUM DIFFICILE BY PCR     Status: None   Collection Time    09/11/13  3:28 AM      Result Value Range Status   C difficile by pcr NEGATIVE  NEGATIVE Final  MRSA PCR SCREENING     Status: None   Collection Time    09/11/13  1:04 PM      Result Value Range Status   MRSA by PCR NEGATIVE  NEGATIVE Final   Comment:            The GeneXpert MRSA Assay (FDA     approved for NASAL specimens     only), is one component of a     comprehensive MRSA colonization     surveillance program. It is not     intended to diagnose MRSA     infection nor to guide or     monitor treatment for     MRSA infections.      09/12/2013, 4:45 PM     LOS: 3 days           [place] [facility] Team Staphylococcus aureus bacteremia   Staphylococcus aureus bacteremia (SAB) is associated with a high rate of complications and mortality.  Specific aspects of clinical management are critical to optimizing the outcome of patients with SAB.  Therefore, the [facility] Antimicrobial Management Team [facility]) has initiated an intervention aimed at improving the management of SAB at [facility].  To do so, Infectious Diseases physicians are providing an evidence-based consult for the management of all patients with SAB.     Yes No Comments  Perform follow-up blood cultures (even if the patient is afebrile) to ensure clearance of bacteremia [x]  []    Remove vascular catheter and obtain follow-up blood cultures after the removal of the catheter []  []    Perform echocardiography to evaluate for endocarditis (transthoracic ECHO is 40-50% sensitive, TEE is > 90% sensitive) []  []  Please keep in mind, that neither test can definitively EXCLUDE endocarditis, and that should clinical suspicion remain high for endocarditis the patient should then still be treated with an "endocarditis" duration of therapy = 6 weeks  Consult electrophysiologist to evaluate implanted cardiac device (pacemaker, ICD) []  []    Ensure source control []  []  Have all abscesses been drained effectively? Have deep seeded infections (septic joints or osteomyelitis) had appropriate surgical debridement?  Investigate for "metastatic" sites of infection []  []  Does the patient have ANY symptom or physical exam finding that would suggest a deeper infection (back or neck pain that may be suggestive of vertebral osteomyelitis or epidural abscess, muscle pain that could be a symptom of pyomyositis)?  Keep in mind that for deep seeded infections MRI imaging with contrast is preferred rather  than other often insensitive tests such as plain x-rays, especially early in a patient's presentation.  Change antibiotic  therapy to __________________ []  []  Beta-lactam antibiotics are preferred for MSSA due to higher cure rates.   If on Vancomycin, goal trough should be 15 - 20 mcg/mL  Estimated duration of IV antibiotic therapy:   []  []  Consult case management for probably prolonged outpatient IV antibiotic therapy

## 2013-09-12 NOTE — Progress Notes (Signed)
ANTIBIOTIC CONSULT NOTE - FOLLOW UP  Pharmacy Consult for Vancomcyin + Ancef Indication: Staph Aureus Bacteremia  No Known Allergies  Patient Measurements: Height: 5\' 11"  (180.3 cm) Weight: 311 lb 15.2 oz (141.5 kg) IBW/kg (Calculated) : 75.3  Vital Signs: Temp: 97.9 F (36.6 C) (11/20 1018) Temp src: Oral (11/20 0500) BP: 134/74 mmHg (11/20 1018) Pulse Rate: 86 (11/20 1018) Intake/Output from previous day: 11/19 0701 - 11/20 0700 In: 240 [P.O.:240] Out: 3000  Intake/Output from this shift:    Labs:  Recent Labs  09/10/13 1000 09/11/13 0525 09/12/13 0600  WBC 11.3* 7.8 5.8  HGB 13.7 13.8 13.5  PLT 146* 128* 151  CREATININE 14.59* 17.73* 11.46*   Estimated Creatinine Clearance: 11.8 ml/min (by C-G formula based on Cr of 11.46). No results found for this basename: VANCOTROUGH, Corlis Leak, VANCORANDOM, [place], GENTPEAK, GENTRANDOM, TOBRATROUGH, TOBRAPEAK, TOBRARND, AMIKACINPEAK, AMIKACINTROU, AMIKACIN,  in the last 72 hours   Microbiology: Recent Results (from the past 720 hour(s))  CULTURE, BLOOD (ROUTINE X 2)     Status: None   Collection Time    09/09/13  4:48 PM      Result Value Range Status   Specimen Description BLOOD HAND RIGHT   Final   Special Requests BOTTLES DRAWN AEROBIC ONLY 2.5CC   Final   Culture  Setup Time     Final   Value: 09/09/2013 22:43     Performed at [facility]   Culture     Final   Value: STAPHYLOCOCCUS AUREUS     Note: RIFAMPIN AND GENTAMICIN SHOULD NOT BE USED AS SINGLE DRUGS FOR TREATMENT OF STAPH INFECTIONS.     Note: Gram Stain Report Called to,Read Back By and Verified With: CY HICKS 09/11/13 1415 BY SMITHERSJ     Performed at [facility]   Report Status PENDING   Incomplete  CULTURE, BLOOD (ROUTINE X 2)     Status: None   Collection Time    09/09/13  6:31 PM      Result Value Range Status   Specimen Description BLOOD HAND RIGHT   Final   Special Requests BOTTLES DRAWN AEROBIC ONLY 2CC   Final   Culture   Setup Time     Final   Value: 09/10/2013 01:23     Performed at [facility]   Culture     Final   Value: STAPHYLOCOCCUS AUREUS     Note: Gram Stain Report Called to,Read Back By and Verified With: CY HICKS 09/11/13 1415 BY SMITHERSJ     Performed at [facility]   Report Status PENDING   Incomplete  CLOSTRIDIUM DIFFICILE BY PCR     Status: None   Collection Time    09/11/13  3:28 AM      Result Value Range Status   C difficile by pcr NEGATIVE  NEGATIVE Final  MRSA PCR SCREENING     Status: None   Collection Time    09/11/13  1:04 PM      Result Value Range Status   MRSA by PCR NEGATIVE  NEGATIVE Final   Comment:            The GeneXpert MRSA Assay (FDA     approved for NASAL specimens     only), is one component of a     comprehensive MRSA colonization     surveillance program. It is not     intended to diagnose MRSA     infection nor to guide or  monitor treatment for     MRSA infections.    Anti-infectives   Start     Dose/Rate Route Frequency Ordered Stop   09/13/13 2000  ceFAZolin (ANCEF) IVPB 2 g/50 mL premix     2 g 100 mL/hr over 30 Minutes Intravenous Every M-W-F (2000) 09/12/13 1034     09/12/13 1000  ceFAZolin (ANCEF) IVPB 2 g/50 mL premix     2 g 100 mL/hr over 30 Minutes Intravenous  Once 09/12/13 0925     09/11/13 1630  vancomycin (VANCOCIN) IVPB 1000 mg/200 mL premix     1,000 mg 200 mL/hr over 60 Minutes Intravenous Every M-W-F (Hemodialysis) 09/11/13 1541     09/10/13 1000  vancomycin (VANCOCIN) 1,500 mg in sodium chloride 0.9 % 500 mL IVPB     1,500 mg 250 mL/hr over 120 Minutes Intravenous  Once 09/10/13 0912 09/10/13 1836   09/10/13 0645  piperacillin-tazobactam (ZOSYN) IVPB 3.375 g     3.375 g 12.5 mL/hr over 240 Minutes Intravenous  Once 09/10/13 [id number] 09/10/13 0803      Assessment: 42 y.o. M who continues on Vancomycin and is now to start Ancef for Staph Aureus bacteremia likely due to infected RIJ-TDC or LUE AVF. The  patient was noted to have MSSA infection/abscess of RUE AVF in June that required resection, [facility] placement on 04/22/13, and creation of LUE AVF 2 weeks later. The patient noted to have drainage from LUE AVF on 10/31 and outpatient cx grew MSSA -- treated with Doxy. The patient is now growing Staph Aureus in 2/2 blood cultures this admission. ID is aware of this patient -- will likely need a full work-up given the recurrent infections over the past 6 months. TTE on 11/18 was negative for vegetation, f/u plans for TEE.   Doxy PTA >> 11/18 Vanc 11/18 >> Cefazolin 11/20 >>  PTA 10/31 WCx >> MSSA (culture report viewed in HD) 11/17 BCx >> Staph Aureus 11/19 CDiff >> neg  Goal of Therapy:  Vancomycin level of 15-25 mcg/ml Proper antibiotics for infection/cultures adjusted for renal/hepatic function   Plan:  1. Ancef 2g IV x 1 dose now followed by 2g post HD sessions on MWF 2. Continue Vancomycin 1g post HD sessions on MWF 3. Will continue to follow renal function, culture results, LOT, and antibiotic de-escalation plans   Alycia Rossetti, PharmD, BCPS Clinical Pharmacist Pager: [phone] 09/12/2013 10:47 AM

## 2013-09-12 NOTE — Progress Notes (Signed)
[facility] Progress Note  Subjective:   Diarrhea resolved. No emerging complaints  Objective Filed Vitals:   09/11/13 2230 09/11/13 2250 09/11/13 2313 09/12/13 0500  BP: 108/51 111/66 107/61 146/90  Pulse: 105 103 110 102  Temp:  97.7 F (36.5 C) 97.9 F (36.6 C) 98.5 F (36.9 C)  TempSrc:  Oral Oral Oral  Resp: 16 16 18 18   Height:      Weight:  141.5 kg (311 lb 15.2 oz)    SpO2:  97% 99% 98%   Physical Exam General: Alert, cooperative, NAD Heart: RRR, no murmur or rub noted Lungs: CTA bilat, no wheezes, rales, rhonchi Abdomen: Obese, NT, non-distended. + BS Extremities: No LE edema Dialysis Access: R TDC/ LFA AVF with + bruit, small scab and no overt sign if infx  Dialysis Orders: Center: [facility] on MWF .  EDW 143 HD Bath 1K/2.5Ca Time 5:00 Heparin 10000. Access TDC/ Maturing L AVF BFR 400 DFR A1.5  Hectorol 6 mcg IV/HD Epogen 0 Units IV/HD Venofer 0  Recent Labs: Hgb 13.9, Tsat 40%, P 8.7, PTH 866, K 5.1. Positive blood cultures collected 11/17 - gram positive cocci in clusters   Assessment/Plan: 1. Catheter-associated MSSA Bacteremia - Per op blood cultures drawn 11/17 and repeat cultures here. Sensitive to Ancef. TTE show no vegetation. VVS consulted for cath removal today. (? possibly place new TDC in IR on Fri or Sat for HD in the afternoon) 2. HD Access - Using [facility]. Maturing L AVF recently treated for area of purulent drainage. (Op wound cx positive for S. Aureus on 10/31). Completed course of Doxy. Per VVS OV notes on 11/4, site healing well and AVF ready for use in about a month (~1st week in Dec) Early use of AVF not ideal.  3. Diarrhea - C Diff negative. Resolved.  4. ESRD - MWF, K+ 4.1 HD Fri or Sat in coordination with new catheter placement 5. Hypertension/volume - SBPs 140s. Known hypotension on HD. Midodrine 5 mg pre-tx. Below edw here by weights. Net UF 3 L yesterday. 6. Anemia - Hgb 13.5 consistent with op labs. No ESAs. Last Tsat 40%. No op  Fe. 7. Metabolic bone disease - Ca 9.4 (9.6 corrected). P 4.9. Last PTH 866. Continue Renvela, Vit D and Sensipar 60 8. Nutrition - Renal diet, add multivitamin  Collene Leyden. Cletus Gash, PA-C [facility] Pager [phone] 09/12/2013,10:14 AM  LOS: 3 days    I was present at this dialysis session, have reviewed the session itself and made  appropriate changes   Kelly Splinter MD  pager [phone]    cell [phone]  09/12/2013, 11:23 AM   Additional Objective Labs: Basic Metabolic Panel:  Recent Labs Lab 09/09/13 1711 09/10/13 1000 09/11/13 0525 09/12/13 0600  NA 136  --  131* 134*  K 4.4  --  4.6 4.1  CL 94*  --  92* 96  CO2 24  --  20 25  GLUCOSE 72  --  87 86  BUN 23  --  52* 26*  CREATININE 10.53* 14.59* 17.73* 11.46*  CALCIUM 10.2  --  9.5 9.4  PHOS  --   --  4.9*  --    Liver Function Tests:  Recent Labs Lab 09/09/13 1711  AST 21  ALT 10  ALKPHOS 62  BILITOT 1.0  PROT 9.2*  ALBUMIN 3.7   CBC:  Recent Labs Lab 09/09/13 1711 09/10/13 1000 09/11/13 0525 09/12/13 0600  WBC 12.8* 11.3* 7.8 5.8  NEUTROABS 10.7*  --   --   --  HGB 15.6 13.7 13.8 13.5  HCT 43.9 38.3* 38.4* 38.2*  MCV 89.0 89.3 88.5 88.8  PLT 167 146* 128* 151   Blood Culture    Component Value Date/Time   SDES BLOOD HAND RIGHT 09/09/2013 1831   SPECREQUEST BOTTLES DRAWN AEROBIC ONLY [facility] 09/09/2013 1831   CULT  Value: STAPHYLOCOCCUS AUREUS Note: Gram Stain Report Called to,Read Back By and Verified With: CY HICKS 09/11/13 1415 BY SMITHERSJ Performed at [facility] 09/09/2013 1831   REPTSTATUS PENDING 09/09/2013 1831    Studies/Results: No results found. Medications:   .  ceFAZolin (ANCEF) IV  2 g Intravenous Once  . cinacalcet  60 mg Oral QAC breakfast  . famotidine  20 mg Oral Daily  . heparin  5,000 Units Subcutaneous Q8H  . midodrine  5 mg Oral Q M,W,F-HD  . multivitamin  1 tablet Oral QHS  . sevelamer carbonate  3,200 mg Oral TID WC  . vancomycin  1,000 mg  Intravenous Q M,W,F-HD

## 2013-09-13 ENCOUNTER — Encounter (HOSPITAL_COMMUNITY): Payer: Self-pay | Admitting: *Deleted

## 2013-09-13 ENCOUNTER — Encounter (HOSPITAL_COMMUNITY)
Admission: EM | Disposition: A | Discharge status: Home / Self Care | Payer: Self-pay | Source: Home / Self Care | Attending: Internal Medicine

## 2013-09-13 DIAGNOSIS — A419 Sepsis, unspecified organism: Secondary | ICD-10-CM | POA: Diagnosis not present

## 2013-09-13 DIAGNOSIS — I120 Hypertensive chronic kidney disease with stage 5 chronic kidney disease or end stage renal disease: Secondary | ICD-10-CM

## 2013-09-13 DIAGNOSIS — R7881 Bacteremia: Secondary | ICD-10-CM | POA: Diagnosis not present

## 2013-09-13 DIAGNOSIS — N186 End stage renal disease: Secondary | ICD-10-CM | POA: Diagnosis not present

## 2013-09-13 DIAGNOSIS — R6510 Systemic inflammatory response syndrome (SIRS) of non-infectious origin without acute organ dysfunction: Secondary | ICD-10-CM | POA: Diagnosis not present

## 2013-09-13 DIAGNOSIS — R112 Nausea with vomiting, unspecified: Secondary | ICD-10-CM | POA: Diagnosis not present

## 2013-09-13 DIAGNOSIS — A4101 Sepsis due to Methicillin susceptible Staphylococcus aureus: Secondary | ICD-10-CM | POA: Diagnosis not present

## 2013-09-13 DIAGNOSIS — R509 Fever, unspecified: Secondary | ICD-10-CM | POA: Diagnosis not present

## 2013-09-13 HISTORY — PX: TEE WITHOUT CARDIOVERSION: SHX5443

## 2013-09-13 LAB — CULTURE, BLOOD (ROUTINE X 2)

## 2013-09-13 LAB — HIV ANTIBODY (ROUTINE TESTING W REFLEX): HIV: NONREACTIVE

## 2013-09-13 SURGERY — ECHOCARDIOGRAM, TRANSESOPHAGEAL
Anesthesia: Moderate Sedation

## 2013-09-13 MED ORDER — BUTAMBEN-TETRACAINE-BENZOCAINE 2-2-14 % EX AERO
INHALATION_SPRAY | CUTANEOUS | Status: DC | PRN
  Administered 2013-09-13: 2 via TOPICAL

## 2013-09-13 MED ORDER — CEFEPIME HCL 2 G IJ SOLR
2.0000 g | INTRAMUSCULAR | Status: DC
  Filled 2013-09-13: qty 2

## 2013-09-13 MED ORDER — CEFAZOLIN SODIUM-DEXTROSE 2-3 GM-% IV SOLR
2.0000 g | INTRAVENOUS | Status: DC
  Administered 2013-09-13: 2 g via INTRAVENOUS
  Filled 2013-09-13 (×2): qty 50

## 2013-09-13 MED ORDER — FENTANYL CITRATE 0.05 MG/ML IJ SOLN
INTRAMUSCULAR | Status: AC
  Filled 2013-09-13: qty 2

## 2013-09-13 MED ORDER — MIDODRINE HCL 5 MG PO TABS
ORAL_TABLET | ORAL | Status: AC
  Filled 2013-09-13: qty 1

## 2013-09-13 MED ORDER — RIFAMPIN 300 MG PO CAPS
300.0000 mg | ORAL_CAPSULE | Freq: Every day | ORAL | Status: DC
  Administered 2013-09-14: 300 mg via ORAL
  Filled 2013-09-13 (×3): qty 1

## 2013-09-13 MED ORDER — RIFAMPIN 300 MG PO CAPS: 300.0000 mg | ORAL_CAPSULE | Freq: Every day | ORAL | Status: DC

## 2013-09-13 MED ORDER — FENTANYL CITRATE 0.05 MG/ML IJ SOLN
INTRAMUSCULAR | Status: DC | PRN
  Administered 2013-09-13 (×4): 25 ug via INTRAVENOUS

## 2013-09-13 MED ORDER — MIDAZOLAM HCL 5 MG/ML IJ SOLN
INTRAMUSCULAR | Status: AC
  Filled 2013-09-13: qty 2

## 2013-09-13 MED ORDER — DIPHENHYDRAMINE HCL 50 MG/ML IJ SOLN
INTRAMUSCULAR | Status: AC
  Filled 2013-09-13: qty 1

## 2013-09-13 MED ORDER — MIDAZOLAM HCL 10 MG/2ML IJ SOLN
INTRAMUSCULAR | Status: DC | PRN
  Administered 2013-09-13 (×2): 2 mg via INTRAVENOUS
  Administered 2013-09-13: 1 mg via INTRAVENOUS
  Administered 2013-09-13: 2 mg via INTRAVENOUS

## 2013-09-13 NOTE — Progress Notes (Signed)
ANTIBIOTIC CONSULT NOTE - INITIAL  Pharmacy Consult for cefepime Indication: MSSA bacteremia   No Known Allergies  Patient Measurements: Height: 5\' 11"  (180.3 cm) Weight: 315 lb 4.8 oz (143.019 kg) IBW/kg (Calculated) : 75.3 Adjusted Body Weight:   Vital Signs: Temp: 98.3 F (36.8 C) (11/21 0935) Temp src: Oral (11/21 0935) BP: 138/80 mmHg (11/21 0935) Pulse Rate: 90 (11/21 0935) Intake/Output from previous day: 11/20 0701 - 11/21 0700 In: 820 [P.O.:720; IV Piggyback:100] Out: -  Intake/Output from this shift:    Labs:  Recent Labs  09/11/13 0525 09/12/13 0600  WBC 7.8 5.8  HGB 13.8 13.5  PLT 128* 151  CREATININE 17.73* 11.46*   Estimated Creatinine Clearance: 11.9 ml/min (by C-G formula based on Cr of 11.46). No results found for this basename: VANCOTROUGH, Corlis Leak, VANCORANDOM, [place], GENTPEAK, GENTRANDOM, TOBRATROUGH, TOBRAPEAK, TOBRARND, AMIKACINPEAK, AMIKACINTROU, AMIKACIN,  in the last 72 hours   Microbiology: Recent Results (from the past 720 hour(s))  CULTURE, BLOOD (ROUTINE X 2)     Status: None   Collection Time    09/09/13  4:48 PM      Result Value Range Status   Specimen Description BLOOD HAND RIGHT   Final   Special Requests BOTTLES DRAWN AEROBIC ONLY 2.5CC   Final   Culture  Setup Time     Final   Value: 09/09/2013 22:43     Performed at [facility]   Culture     Final   Value: STAPHYLOCOCCUS AUREUS     Note: RIFAMPIN AND GENTAMICIN SHOULD NOT BE USED AS SINGLE DRUGS FOR TREATMENT OF STAPH INFECTIONS.     Note: Gram Stain Report Called to,Read Back By and Verified With: CY HICKS 09/11/13 1415 BY SMITHERSJ     Performed at [facility]   Report Status 09/13/2013 FINAL   Final   Organism ID, Bacteria STAPHYLOCOCCUS AUREUS   Final  CULTURE, BLOOD (ROUTINE X 2)     Status: None   Collection Time    09/09/13  6:31 PM      Result Value Range Status   Specimen Description BLOOD HAND RIGHT   Final   Special Requests BOTTLES  DRAWN AEROBIC ONLY 2CC   Final   Culture  Setup Time     Final   Value: 09/10/2013 01:23     Performed at [facility]   Culture     Final   Value: STAPHYLOCOCCUS AUREUS     Note: SUSCEPTIBILITIES PERFORMED ON PREVIOUS CULTURE WITHIN THE LAST 5 DAYS.     Note: Gram Stain Report Called to,Read Back By and Verified With: CY HICKS 09/11/13 1415 BY SMITHERSJ     Performed at [facility]   Report Status 09/13/2013 FINAL   Final  CLOSTRIDIUM DIFFICILE BY PCR     Status: None   Collection Time    09/11/13  3:28 AM      Result Value Range Status   C difficile by pcr NEGATIVE  NEGATIVE Final  MRSA PCR SCREENING     Status: None   Collection Time    09/11/13  1:04 PM      Result Value Range Status   MRSA by PCR NEGATIVE  NEGATIVE Final   Comment:            The GeneXpert MRSA Assay (FDA     approved for NASAL specimens     only), is one component of a     comprehensive MRSA colonization  surveillance program. It is not     intended to diagnose MRSA     infection nor to guide or     monitor treatment for     MRSA infections.  CATH TIP CULTURE     Status: None   Collection Time    09/12/13  2:10 PM      Result Value Range Status   Specimen Description CATH TIP   Final   Special Requests HD CATH TIP   Final   Culture     Final   Value: NO GROWTH     Performed at [facility]   Report Status PENDING   Incomplete  CULTURE, BLOOD (ROUTINE X 2)     Status: None   Collection Time    09/12/13  2:37 PM      Result Value Range Status   Specimen Description BLOOD RIGHT HAND   Final   Special Requests BOTTLES DRAWN AEROBIC ONLY 10CC   Final   Culture  Setup Time     Final   Value: 09/12/2013 20:42     Performed at [facility]   Culture     Final   Value:        BLOOD CULTURE RECEIVED NO GROWTH TO DATE CULTURE WILL BE HELD FOR 5 DAYS BEFORE ISSUING A FINAL NEGATIVE REPORT     Performed at [facility]   Report Status PENDING   Incomplete   CULTURE, BLOOD (ROUTINE X 2)     Status: None   Collection Time    09/12/13  2:44 PM      Result Value Range Status   Specimen Description BLOOD RIGHT FOREARM   Final   Special Requests BOTTLES DRAWN AEROBIC ONLY [facility]   Final   Culture  Setup Time     Final   Value: 09/12/2013 20:40     Performed at [facility]   Culture     Final   Value:        BLOOD CULTURE RECEIVED NO GROWTH TO DATE CULTURE WILL BE HELD FOR 5 DAYS BEFORE ISSUING A FINAL NEGATIVE REPORT     Performed at [facility]   Report Status PENDING   Incomplete    Medical History: Past Medical History  Diagnosis Date  . Chronic kidney disease   . Hypertension   . Hemodialysis finding May 2 ,2005    First HD at [facility].  . Anemia     Iron deficiency  . End stage renal disease on dialysis   . Aneurysm 02/15/2007    Right arm large aneurysm  . GERD (gastroesophageal reflux disease)     Medications:  Scheduled:  . cinacalcet  60 mg Oral QAC breakfast  . famotidine  20 mg Oral Daily  . heparin  5,000 Units Subcutaneous Q8H  . midodrine  5 mg Oral Q M,W,F-HD  . multivitamin  1 tablet Oral QHS  . rifampin (RIFADIN) IVPB  300 mg Intravenous Q24H  . sevelamer carbonate  3,200 mg Oral TID WC   Infusions:   Assessment: 42 yo male with MSSA bacteremia will be switched from ancef + vancomycin to cefepime per ID's recommendation.  Patient is also on rifampin.  He is on HD MWF.  Plan:  1) Cefepime 2g iv qHD (MWF) 2) f/u further plan on antibiotic  Jeanette Moffatt, Tsz-Yin 09/13/2013,10:56 AM

## 2013-09-13 NOTE — Progress Notes (Signed)
[facility] Progress Note  Subjective:   Feeling better.  Not clear of access plan  Objective Filed Vitals:   09/12/13 1259 09/12/13 1826 09/12/13 2101 09/13/13 0508  BP: 127/73 134/67 107/67 133/78  Pulse: 105 100 95 93  Temp: 98.7 F (37.1 C) 98.1 F (36.7 C) 98.8 F (37.1 C) 99 F (37.2 C)  TempSrc:  Oral Oral Oral  Resp: 21 22 18 19   Height:      Weight:   143.019 kg (315 lb 4.8 oz)   SpO2: 97% 98% 98% 98%   Physical Exam General: NAD Heart: RRR Lungs: no wheezes or rales Abdomen: soft obese Extremities: no LE edmea Dialysis Access: R TDC/ LFA AVF with + bruit, small scab and no overt sign if infx   Dialysis Orders: Center: [facility] on MWF .  EDW 143 HD Bath 1K/2.5Ca Time 5:00 Heparin 10000. Access TDC/ Maturing L AVF BFR 400 DFR A1.5  Hectorol 6 mcg IV/HD Epogen 0 Units IV/HD Venofer 0  Recent Labs: Hgb 13.9, Tsat 40%, P 8.7, PTH 866, K 5.1. Positive blood cultures collected 11/17 - gram positive cocci in clusters   Assessment/Plan:  1. Catheter-associated MSSA Bacteremia - Per op blood cultures drawn 11/17 and repeat cultures here. Sensitive to Ancef. TTE show no vegetation. VVS removed catheter; unclear whether it will need to be replaced. TEE ordered for later today 2. HD Access - Using [facility]. Maturing L AVF recently treated for area of purulent drainage. (Op wound cx positive for S. Aureus on 10/31). Completed course of Doxy. Per VVS OV notes on 11/4, site healing well and AVF ready for use in about a month (~1st week in Dec) Had superficialization and ligation of competing branches 07/24/13 - would prefer to use AVF now if possible instead of placing catheter - have discussed with  VVS and recommend we try to use - I agree  If problems, he has been scheduled for [facility] placement in IR on Sat - IF we need it 3. Diarrhea - C Diff negative. Resolved 4. ESRD - MWF, K+ 4.1 HD Fri or Sat pending access 5. Hypertension/volume - SBPs ok. Known hypotension on HD.  Midodrine 5 mg pre-tx. Below edw here by weights 6. Anemia - Hgb 13.5 consistent with op labs. No ESAs. Last Tsat 40%. No op Fe 7. Metabolic bone disease - Ca 9.4 (9.6 corrected). P 4.9. Last PTH 866. Continue Renvela, Vit D and Sensipar 60 8. Nutrition - Renal diet, add multivitamin  Myriam Jacobson, PA-C [facility] Beeper [phone] 09/13/2013,9:34 AM  LOS: 4 days   I have seen and examined patient, discussed with PA and agree with assessment and plan as outlined above. Kelly Splinter MD pager [phone]    cell [phone] 09/13/2013, 2:21 PM     Additional Objective Labs: Basic Metabolic Panel:  Recent Labs Lab 09/09/13 1711 09/10/13 1000 09/11/13 0525 09/12/13 0600  NA 136  --  131* 134*  K 4.4  --  4.6 4.1  CL 94*  --  92* 96  CO2 24  --  20 25  GLUCOSE 72  --  87 86  BUN 23  --  52* 26*  CREATININE 10.53* 14.59* 17.73* 11.46*  CALCIUM 10.2  --  9.5 9.4  PHOS  --   --  4.9*  --    Liver Function Tests:  Recent Labs Lab 09/09/13 1711  AST 21  ALT 10  ALKPHOS 62  BILITOT 1.0  PROT 9.2*  ALBUMIN  3.7   CBC:  Recent Labs Lab 09/09/13 1711 09/10/13 1000 09/11/13 0525 09/12/13 0600  WBC 12.8* 11.3* 7.8 5.8  NEUTROABS 10.7*  --   --   --   HGB 15.6 13.7 13.8 13.5  HCT 43.9 38.3* 38.4* 38.2*  MCV 89.0 89.3 88.5 88.8  PLT 167 146* 128* 151   Blood Culture    Component Value Date/Time   SDES BLOOD RIGHT FOREARM 09/12/2013 1444   SPECREQUEST BOTTLES DRAWN AEROBIC ONLY [place] 09/12/2013 1444   CULT  Value:        BLOOD CULTURE RECEIVED NO GROWTH TO DATE CULTURE WILL BE HELD FOR 5 DAYS BEFORE ISSUING A FINAL NEGATIVE REPORT Performed at [facility] 09/12/2013 1444   REPTSTATUS PENDING 09/12/2013 1444   Medications:   .  ceFAZolin (ANCEF) IV  2 g Intravenous Q M,W,F-2000  . cinacalcet  60 mg Oral QAC breakfast  . famotidine  20 mg Oral Daily  . heparin  5,000 Units Subcutaneous Q8H  . midodrine  5 mg Oral Q M,W,F-HD  .  multivitamin  1 tablet Oral QHS  . rifampin (RIFADIN) IVPB  300 mg Intravenous Q24H  . sevelamer carbonate  3,200 mg Oral TID WC  . vancomycin  1,000 mg Intravenous Q M,W,F-HD

## 2013-09-13 NOTE — Consult Note (Signed)
[facility]ist [facility]  Patient name: [name] MRN: [id number] DOB: 05/26/1971 Sex: male  REASON FOR CONSULT: evaluate AV fistula to determine if it can be used and to determine if it is potentially infected.  HPI: [name] is a 42 y.o. male who was last seen in our office on 08/27/2013 by Dr. Donnetta Hutching. He had an area of drainage in his mid forearm. He was on oral antibiotics which he continued. Was felt that his fistula could be used in approximately one month. He had undergone superficial mobilization and ligation of competing branches of his left radiocephalic AV fistula on [id number]. He was admitted on 09/10/2013 for fevers. He had a blood culture which grew staph aureus on 09/09/2013. He had his tunneled dialysis catheter removed on the right as this might potentially have been a source of infection. He has no specific complaints referrable to his fistula.  Past Medical History  Diagnosis Date  . Chronic kidney disease   . Hypertension   . Hemodialysis finding May 2 ,2005    First HD at [facility]  . Anemia     Iron deficiency  . End stage renal disease on dialysis   . Aneurysm 02/15/2007    Right arm large aneurysm  . GERD (gastroesophageal reflux disease)    Family History  Problem Relation Age of Onset  . Hypertension Mother   . Heart disease Mother     before age 69  . Other Brother     DVT   SOCIAL HISTORY: History  Substance Use Topics  . Smoking status: Light Tobacco Smoker    Types: Cigars  . Smokeless tobacco: Never Used     Comment: pt states he smokes about 3 cigars per year  . Alcohol Use: Yes     Comment: 3 drinks a year   No Known Allergies  REVIEW OF SYSTEMS: Valu.Nieves ] denotes positive finding; [  ] denotes negative finding CARDIOVASCULAR:  [ ]  chest pain   [ ]  chest pressure   [ ]  palpitations   [ ]  orthopnea   [ ]  dyspnea on exertion   [ ]  claudication   [ ]  rest pain   [ ]  DVT   [ ]  phlebitis PULMONARY:   [ ]   productive cough   [ ]  asthma   [ ]  wheezing NEUROLOGIC:   [ ]  weakness  [ ]  paresthesias  [ ]  aphasia  [ ]  amaurosis  [ ]  dizziness HEMATOLOGIC:   [ ]  bleeding problems   [ ]  clotting disorders PSYCHIATRIC:  [ ]  history of major depression INTEGUMENTARY:  [ ]  rashes  [ ]  ulcers CONSTITUTIONAL:  [ ]  fever   [ ]  chills  PHYSICAL EXAM: Filed Vitals:   09/13/13 1640 09/13/13 1650 09/13/13 1655 09/13/13 1711  BP: 117/70 127/61 131/62 161/114  Pulse:    94  Temp:    98.3 F (36.8 C)  TempSrc:    Oral  Resp: 21 22 21 20   Height:      Weight:      SpO2: 96% 95% 96% 100%   Body mass index is 43.99 kg/(m^2). GENERAL: The patient is a well-nourished male, in no acute distress. The vital signs are documented above. CARDIOVASCULAR: There is a regular rate and rhythm.  PULMONARY: There is good air exchange bilaterally without wheezing or rales. His left radiocephalic fistula has an excellent thrill and bruit. There is one small eschar approximately 2 mm in diameter in  the central portion of the forearm. There is no erythema or drainage.  DATA:  Lab Results  Component Value Date   WBC 5.8 09/12/2013   HGB 13.5 09/12/2013   HCT 38.2* 09/12/2013   MCV 88.8 09/12/2013   PLT 151 09/12/2013   Lab Results  Component Value Date   NA 134* 09/12/2013   K 4.1 09/12/2013   CL 96 09/12/2013   CO2 25 09/12/2013   Lab Results  Component Value Date   CREATININE 11.46* 09/12/2013   MEDICAL ISSUES: The patient is currently on his way to dialysis and I plan on using his fistula. If there are any problems with using his fistula that we would have to place a tunneled dialysis catheter until this was ready to use. Currently I do not see any obvious signs of infection in the left forearm AV fistula. We will follow.  [facility] [employer] Beeper: [phone]

## 2013-09-13 NOTE — CV Procedure (Signed)
INDICATIONS: infective endocarditis and ESRD  PROCEDURE:   Informed consent was obtained prior to the procedure. The risks, benefits and alternatives for the procedure were discussed and the patient comprehended these risks.  Risks include, but are not limited to, cough, sore throat, vomiting, nausea, somnolence, esophageal and stomach trauma or perforation, bleeding, low blood pressure, aspiration, pneumonia, infection, trauma to the teeth and death.    After a procedural time-out, the oropharynx was anesthetized with 20% benzocaine spray. The patient was given 7 mg versed and 100 mcg fentanyl for moderate sedation.   The transesophageal probe was inserted in the esophagus and stomach without difficulty and multiple views were obtained.  The patient was kept under observation until the patient left the procedure room.  The patient left the procedure room in stable condition.   Agitated microbubble saline contrast was not administered.  COMPLICATIONS:    There were no immediate complications.  FINDINGS:  No evidence of endocarditis   Time Spent Directly with the Patient:  30 minutes   [name] 09/13/2013, 4:16 PM

## 2013-09-13 NOTE — Progress Notes (Signed)
[facility] PROGRESS NOTE  [name] [id number] DOB: July 28, 1971 DOA: 09/09/2013 PCP: Ulla Potash., MD  Assessment/Plan: 1. MSSA bacteremia/Sepsis - suspect HD catheter related infection, improving - currently on IV Vanc/Ancef, could transition to IV ANcef alone,  - ID following - D/w Dr.Croitoru for TEE at 4pm today, 2D ECHO benign - s/p HD catheter removal 11/20 -  repeat Blood Cx negative so far  2. Diarrhea -suspect related to sepsis, abx -improving -Cdiff pCR negative  3. ESRD on HD MWF -s/p  HD 11/19pm -appreciate  Renal input -has AVF in LUE which is was placed July 11 -VVS to eval this today  Code Status: Full Family Communication: none at bedside Disposition Plan: home when improved   Consultants:  Renal  Antibiotics:  vancomycin  HPI/Subjective: No complaints  Objective: Filed Vitals:   09/13/13 0935  BP: 138/80  Pulse: 90  Temp: 98.3 F (36.8 C)  Resp: 18    Intake/Output Summary (Last 24 hours) at 09/13/13 1006 Last data filed at 09/13/13 0900  Gross per 24 hour  Intake    700 ml  Output      0 ml  Net    700 ml   Filed Weights   09/11/13 1745 09/11/13 2250 09/12/13 2101  Weight: 144.3 kg (318 lb 2 oz) 141.5 kg (311 lb 15.2 oz) 143.019 kg (315 lb 4.8 oz)    Exam:   General: AAOx3  Cardiovascular: S1s2/RRR  Chest: HD catheter noted  Respiratory: CTAB  Abdomen: soft, NT, BS present  Musculoskeletal: no edema c/c   Data Reviewed: Basic Metabolic Panel:  Recent Labs Lab 09/09/13 1711 09/10/13 1000 09/11/13 0525 09/12/13 0600  NA 136  --  131* 134*  K 4.4  --  4.6 4.1  CL 94*  --  92* 96  CO2 24  --  20 25  GLUCOSE 72  --  87 86  BUN 23  --  52* 26*  CREATININE 10.53* 14.59* 17.73* 11.46*  CALCIUM 10.2  --  9.5 9.4  PHOS  --   --  4.9*  --    Liver Function Tests:  Recent Labs Lab 09/09/13 1711  AST 21  ALT 10  ALKPHOS 62  BILITOT 1.0  PROT 9.2*  ALBUMIN 3.7   No results found for this  basename: LIPASE, AMYLASE,  in the last 168 hours No results found for this basename: AMMONIA,  in the last 168 hours CBC:  Recent Labs Lab 09/09/13 1711 09/10/13 1000 09/11/13 0525 09/12/13 0600  WBC 12.8* 11.3* 7.8 5.8  NEUTROABS 10.7*  --   --   --   HGB 15.6 13.7 13.8 13.5  HCT 43.9 38.3* 38.4* 38.2*  MCV 89.0 89.3 88.5 88.8  PLT 167 146* 128* 151   Cardiac Enzymes: No results found for this basename: CKTOTAL, CKMB, CKMBINDEX, TROPONINI,  in the last 168 hours BNP (last 3 results) No results found for this basename: PROBNP,  in the last 8760 hours CBG: No results found for this basename: GLUCAP,  in the last 168 hours  Recent Results (from the past 240 hour(s))  CULTURE, BLOOD (ROUTINE X 2)     Status: None   Collection Time    09/09/13  4:48 PM      Result Value Range Status   Specimen Description BLOOD HAND RIGHT   Final   Special Requests BOTTLES DRAWN AEROBIC ONLY 2.5CC   Final   Culture  Setup Time     Final  Value: 09/09/2013 22:43     Performed at [facility]   Culture     Final   Value: STAPHYLOCOCCUS AUREUS     Note: RIFAMPIN AND GENTAMICIN SHOULD NOT BE USED AS SINGLE DRUGS FOR TREATMENT OF STAPH INFECTIONS.     Note: Gram Stain Report Called to,Read Back By and Verified With: CY HICKS 09/11/13 1415 BY SMITHERSJ     Performed at [facility]   Report Status 09/13/2013 FINAL   Final   Organism ID, Bacteria STAPHYLOCOCCUS AUREUS   Final  CULTURE, BLOOD (ROUTINE X 2)     Status: None   Collection Time    09/09/13  6:31 PM      Result Value Range Status   Specimen Description BLOOD HAND RIGHT   Final   Special Requests BOTTLES DRAWN AEROBIC ONLY 2CC   Final   Culture  Setup Time     Final   Value: 09/10/2013 01:23     Performed at [facility]   Culture     Final   Value: STAPHYLOCOCCUS AUREUS     Note: SUSCEPTIBILITIES PERFORMED ON PREVIOUS CULTURE WITHIN THE LAST 5 DAYS.     Note: Gram Stain Report Called to,Read Back By and  Verified With: CY HICKS 09/11/13 1415 BY SMITHERSJ     Performed at [facility]   Report Status 09/13/2013 FINAL   Final  CLOSTRIDIUM DIFFICILE BY PCR     Status: None   Collection Time    09/11/13  3:28 AM      Result Value Range Status   C difficile by pcr NEGATIVE  NEGATIVE Final  MRSA PCR SCREENING     Status: None   Collection Time    09/11/13  1:04 PM      Result Value Range Status   MRSA by PCR NEGATIVE  NEGATIVE Final   Comment:            The GeneXpert MRSA Assay (FDA     approved for NASAL specimens     only), is one component of a     comprehensive MRSA colonization     surveillance program. It is not     intended to diagnose MRSA     infection nor to guide or     monitor treatment for     MRSA infections.  CATH TIP CULTURE     Status: None   Collection Time    09/12/13  2:10 PM      Result Value Range Status   Specimen Description CATH TIP   Final   Special Requests HD CATH TIP   Final   Culture     Final   Value: NO GROWTH     Performed at [facility]   Report Status PENDING   Incomplete  CULTURE, BLOOD (ROUTINE X 2)     Status: None   Collection Time    09/12/13  2:37 PM      Result Value Range Status   Specimen Description BLOOD RIGHT HAND   Final   Special Requests BOTTLES DRAWN AEROBIC ONLY 10CC   Final   Culture  Setup Time     Final   Value: 09/12/2013 20:42     Performed at [facility]   Culture     Final   Value:        BLOOD CULTURE RECEIVED NO GROWTH TO DATE CULTURE WILL BE HELD FOR 5 DAYS BEFORE ISSUING A FINAL NEGATIVE REPORT  Performed at [facility]   Report Status PENDING   Incomplete  CULTURE, BLOOD (ROUTINE X 2)     Status: None   Collection Time    09/12/13  2:44 PM      Result Value Range Status   Specimen Description BLOOD RIGHT FOREARM   Final   Special Requests BOTTLES DRAWN AEROBIC ONLY [facility]   Final   Culture  Setup Time     Final   Value: 09/12/2013 20:40     Performed at [facility]   Culture     Final   Value:        BLOOD CULTURE RECEIVED NO GROWTH TO DATE CULTURE WILL BE HELD FOR 5 DAYS BEFORE ISSUING A FINAL NEGATIVE REPORT     Performed at [facility]   Report Status PENDING   Incomplete     Studies: No results found.  Scheduled Meds: .  ceFAZolin (ANCEF) IV  2 g Intravenous Q M,W,F-2000  . cinacalcet  60 mg Oral QAC breakfast  . famotidine  20 mg Oral Daily  . heparin  5,000 Units Subcutaneous Q8H  . midodrine  5 mg Oral Q M,W,F-HD  . multivitamin  1 tablet Oral QHS  . rifampin (RIFADIN) IVPB  300 mg Intravenous Q24H  . sevelamer carbonate  3,200 mg Oral TID WC  . vancomycin  1,000 mg Intravenous Q M,W,F-HD   Continuous Infusions:   Active Problems:   End stage renal disease   Fever in adult   SIRS (systemic inflammatory response syndrome)    Time spent: 59min    [facility] Pager [phone]. If 7PM-7AM, please contact night-coverage at [email or web address], password [facility] 09/13/2013, 10:06 AM  LOS: 4 days

## 2013-09-13 NOTE — Progress Notes (Signed)
[place] for Infectious [facility]  Day # 4 vancomycin Day #2 cefazolin  Subjective: No new complaints   Antibiotics:  Anti-infectives   Start     Dose/Rate Route Frequency Ordered Stop   09/13/13 2000  ceFAZolin (ANCEF) IVPB 2 g/50 mL premix  Status:  Discontinued     2 g 100 mL/hr over 30 Minutes Intravenous Every M-W-F (2000) 09/12/13 1034 09/13/13 1047   09/13/13 1300  ceFEPIme (MAXIPIME) 2 g in dextrose 5 % 50 mL IVPB     2 g 100 mL/hr over 30 Minutes Intravenous Every M-W-F (Hemodialysis) 09/13/13 1059     09/12/13 1830  rifampin (RIFADIN) 300 mg in sodium chloride 0.9 % 100 mL IVPB     300 mg 200 mL/hr over 30 Minutes Intravenous Every 24 hours 09/12/13 1718     09/12/13 1000  ceFAZolin (ANCEF) IVPB 2 g/50 mL premix     2 g 100 mL/hr over 30 Minutes Intravenous  Once 09/12/13 0925 09/12/13 1435   09/11/13 1630  vancomycin (VANCOCIN) IVPB 1000 mg/200 mL premix  Status:  Discontinued     1,000 mg 200 mL/hr over 60 Minutes Intravenous Every M-W-F (Hemodialysis) 09/11/13 1541 09/13/13 1049   09/10/13 1000  vancomycin (VANCOCIN) 1,500 mg in sodium chloride 0.9 % 500 mL IVPB     1,500 mg 250 mL/hr over 120 Minutes Intravenous  Once 09/10/13 0912 09/10/13 1836   09/10/13 0645  piperacillin-tazobactam (ZOSYN) IVPB 3.375 g     3.375 g 12.5 mL/hr over 240 Minutes Intravenous  Once 09/10/13 [id number] 09/10/13 0803      Medications: Scheduled Meds: . ceFEPime (MAXIPIME) IV  2 g Intravenous Q M,W,F-HD  . cinacalcet  60 mg Oral QAC breakfast  . famotidine  20 mg Oral Daily  . heparin  5,000 Units Subcutaneous Q8H  . midodrine  5 mg Oral Q M,W,F-HD  . multivitamin  1 tablet Oral QHS  . rifampin (RIFADIN) IVPB  300 mg Intravenous Q24H  . sevelamer carbonate  3,200 mg Oral TID WC   Continuous Infusions:  PRN Meds:.diphenhydrAMINE, loperamide, ondansetron (ZOFRAN) IV, ondansetron, polyethylene glycol   Objective: Weight change: -2 lb 13.2 oz (-1.281 kg)  Intake/Output  Summary (Last 24 hours) at 09/13/13 1500 Last data filed at 09/13/13 1300  Gross per 24 hour  Intake    460 ml  Output      0 ml  Net    460 ml   Blood pressure 137/73, pulse 92, temperature 98.6 F (37 C), temperature source Oral, resp. rate 20, height 5\' 11"  (1.803 m), weight 315 lb 4.8 oz (143.019 kg), SpO2 99.00%. Temp:  [98.1 F (36.7 C)-99 F (37.2 C)] 98.6 F (37 C) (11/21 1331) Pulse Rate:  [90-100] 92 (11/21 1331) Resp:  [18-22] 20 (11/21 1331) BP: (107-138)/(67-80) 137/73 mmHg (11/21 1331) SpO2:  [98 %-100 %] 99 % (11/21 1331) Weight:  [315 lb 4.8 oz (143.019 kg)] 315 lb 4.8 oz (143.019 kg) (11/20 2101)  Physical Exam: General: Alert and awake, oriented x3, not in any acute distress. HEENT: anicteric sclera, , EOMI CVS regular rate, normal r,  no murmur rubs or gallops Chest: clear to auscultation bilaterally, no wheezing, rales or rhonchi Abdomen: soft nontender, nondistended, normal bowel sounds, Extremities: no  clubbing or edema noted bilaterally Skin: site on the left where he had "stitch abscess is without active drainage currently Neuro: nonfocal  Lab Results:  Recent Labs  09/11/13 0525 09/12/13 0600  WBC 7.8 5.8  HGB  13.8 13.5  HCT 38.4* 38.2*  PLT 128* 151    BMET  Recent Labs  09/11/13 0525 09/12/13 0600  NA 131* 134*  K 4.6 4.1  CL 92* 96  CO2 20 25  GLUCOSE 87 86  BUN 52* 26*  CREATININE 17.73* 11.46*  CALCIUM 9.5 9.4    Micro Results: Recent Results (from the past 240 hour(s))  CULTURE, BLOOD (ROUTINE X 2)     Status: None   Collection Time    09/09/13  4:48 PM      Result Value Range Status   Specimen Description BLOOD HAND RIGHT   Final   Special Requests BOTTLES DRAWN AEROBIC ONLY 2.5CC   Final   Culture  Setup Time     Final   Value: 09/09/2013 22:43     Performed at [facility]   Culture     Final   Value: STAPHYLOCOCCUS AUREUS     Note: RIFAMPIN AND GENTAMICIN SHOULD NOT BE USED AS SINGLE DRUGS FOR TREATMENT  OF STAPH INFECTIONS.     Note: Gram Stain Report Called to,Read Back By and Verified With: CY HICKS 09/11/13 1415 BY SMITHERSJ     Performed at [facility]   Report Status 09/13/2013 FINAL   Final   Organism ID, Bacteria STAPHYLOCOCCUS AUREUS   Final  CULTURE, BLOOD (ROUTINE X 2)     Status: None   Collection Time    09/09/13  6:31 PM      Result Value Range Status   Specimen Description BLOOD HAND RIGHT   Final   Special Requests BOTTLES DRAWN AEROBIC ONLY 2CC   Final   Culture  Setup Time     Final   Value: 09/10/2013 01:23     Performed at [facility]   Culture     Final   Value: STAPHYLOCOCCUS AUREUS     Note: SUSCEPTIBILITIES PERFORMED ON PREVIOUS CULTURE WITHIN THE LAST 5 DAYS.     Note: Gram Stain Report Called to,Read Back By and Verified With: CY HICKS 09/11/13 1415 BY SMITHERSJ     Performed at [facility]   Report Status 09/13/2013 FINAL   Final  CLOSTRIDIUM DIFFICILE BY PCR     Status: None   Collection Time    09/11/13  3:28 AM      Result Value Range Status   C difficile by pcr NEGATIVE  NEGATIVE Final  MRSA PCR SCREENING     Status: None   Collection Time    09/11/13  1:04 PM      Result Value Range Status   MRSA by PCR NEGATIVE  NEGATIVE Final   Comment:            The GeneXpert MRSA Assay (FDA     approved for NASAL specimens     only), is one component of a     comprehensive MRSA colonization     surveillance program. It is not     intended to diagnose MRSA     infection nor to guide or     monitor treatment for     MRSA infections.  CATH TIP CULTURE     Status: None   Collection Time    09/12/13  2:10 PM      Result Value Range Status   Specimen Description CATH TIP   Final   Special Requests HD CATH TIP   Final   Culture     Final   Value: NO GROWTH  Performed at [facility]   Report Status PENDING   Incomplete  CULTURE, BLOOD (ROUTINE X 2)     Status: None   Collection Time    09/12/13  2:37 PM       Result Value Range Status   Specimen Description BLOOD RIGHT HAND   Final   Special Requests BOTTLES DRAWN AEROBIC ONLY 10CC   Final   Culture  Setup Time     Final   Value: 09/12/2013 20:42     Performed at [facility]   Culture     Final   Value:        BLOOD CULTURE RECEIVED NO GROWTH TO DATE CULTURE WILL BE HELD FOR 5 DAYS BEFORE ISSUING A FINAL NEGATIVE REPORT     Performed at [facility]   Report Status PENDING   Incomplete  CULTURE, BLOOD (ROUTINE X 2)     Status: None   Collection Time    09/12/13  2:44 PM      Result Value Range Status   Specimen Description BLOOD RIGHT FOREARM   Final   Special Requests BOTTLES DRAWN AEROBIC ONLY [facility]   Final   Culture  Setup Time     Final   Value: 09/12/2013 20:40     Performed at [facility]   Culture     Final   Value:        BLOOD CULTURE RECEIVED NO GROWTH TO DATE CULTURE WILL BE HELD FOR 5 DAYS BEFORE ISSUING A FINAL NEGATIVE REPORT     Performed at [facility]   Report Status PENDING   Incomplete    Studies/Results: No results found.    Assessment/Plan: [name] is a 42 y.o. male with hx of ESRD (secondary to HTN), previous AV fistula abscess 04-22-2013 (s aureus), wound Cx from stitch absces/AVG staph aureus (08-23-13). He was started on po  abx and then ultimately admitted and found to have MSSA bacteremia and sepsis  #1 MSSA Sepsis:  --greatly appreciate removal of HD catheter  --fu post HD catheter blood cultures  --PLEASE DO NOT PLACE ANOTHER HD CATHETER UNTIL WE HAVE HAD 48 HOURS FREE OF INTRASCULAR DEVICE  --HIS AV GRAFT NEEDS EVAL OF area of AVG and stitch abscess with fistulogram, VVS consult  --he needs TEE  --Days cefazolin and rifampin. He'll need at least 4 weeks of therapy  #2 Screening:  hiv negative, will check Hep C     LOS: 4 days   Alcide Evener 09/13/2013, 3:00 PM

## 2013-09-13 NOTE — Progress Notes (Signed)
Hemodialysis- pt avf cannulated with 17g needles x2 without issue avoiding scabbed/scarred area. Blood flow rate confirmed with Dr. Jimmy Footman, ok 250-300bfr. Continue to monitor pt. AP -160 VP200 at 300BFR

## 2013-09-13 NOTE — Progress Notes (Signed)
Echocardiogram Echocardiogram Transesophageal has been performed.  [name] 09/13/2013, 4:54 PM

## 2013-09-14 DIAGNOSIS — A419 Sepsis, unspecified organism: Secondary | ICD-10-CM | POA: Diagnosis not present

## 2013-09-14 DIAGNOSIS — N186 End stage renal disease: Secondary | ICD-10-CM | POA: Diagnosis not present

## 2013-09-14 DIAGNOSIS — A4901 Methicillin susceptible Staphylococcus aureus infection, unspecified site: Secondary | ICD-10-CM | POA: Diagnosis not present

## 2013-09-14 DIAGNOSIS — I120 Hypertensive chronic kidney disease with stage 5 chronic kidney disease or end stage renal disease: Secondary | ICD-10-CM | POA: Diagnosis not present

## 2013-09-14 DIAGNOSIS — T889XXS Complication of surgical and medical care, unspecified, sequela: Secondary | ICD-10-CM | POA: Diagnosis not present

## 2013-09-14 DIAGNOSIS — R112 Nausea with vomiting, unspecified: Secondary | ICD-10-CM | POA: Diagnosis not present

## 2013-09-14 DIAGNOSIS — R7881 Bacteremia: Secondary | ICD-10-CM | POA: Diagnosis not present

## 2013-09-14 DIAGNOSIS — A4101 Sepsis due to Methicillin susceptible Staphylococcus aureus: Secondary | ICD-10-CM | POA: Diagnosis not present

## 2013-09-14 LAB — CBC
HCT: 38.2 % — ABNORMAL LOW (ref 39.0–52.0)
Hemoglobin: 13.6 g/dL (ref 13.0–17.0)
MCV: 88.8 fL (ref 78.0–100.0)
RBC: 4.3 MIL/uL (ref 4.22–5.81)
WBC: 5.7 10*3/uL (ref 4.0–10.5)

## 2013-09-14 LAB — BASIC METABOLIC PANEL
BUN: 26 mg/dL — ABNORMAL HIGH (ref 6–23)
Chloride: 93 mEq/L — ABNORMAL LOW (ref 96–112)
Creatinine, Ser: 10.95 mg/dL — ABNORMAL HIGH (ref 0.50–1.35)
Glucose, Bld: 108 mg/dL — ABNORMAL HIGH (ref 70–99)
Potassium: 4 mEq/L (ref 3.5–5.1)

## 2013-09-14 MED ORDER — RIFAMPIN 300 MG PO CAPS: 300.0000 mg | ORAL_CAPSULE | Freq: Every day | ORAL | Status: DC

## 2013-09-14 MED ORDER — CEFAZOLIN SODIUM-DEXTROSE 2-3 GM-% IV SOLR: 2.0000 g | INTRAVENOUS | Status: DC

## 2013-09-14 NOTE — Progress Notes (Addendum)
[facility] PROGRESS NOTE  [name] [id number] DOB: 02/10/1971 DOA: 09/09/2013 PCP: Ulla Potash., MD  Assessment/Plan: 1. MSSA bacteremia/Sepsis - suspect HD catheter related infection, improving - currently on IV Ancef with HD and Rifampin Daily, will need this for 4 weeks per ID stop date 12/18 (from 11/20-first negative cx after HD cath removed) - ID following - TEE 11/21 normal no vegetations, 2D ECHO benign - s/p HD catheter removal 11/20 -  repeat Blood Cx negative so far - VVS following evaluated AVF and didn't suspect infection at this time  2. Diarrhea -suspect related to sepsis, abx -improving -Cdiff pCR negative  3. ESRD on HD MWF -s/p  HD 11/19pm -appreciate  Renal input -AVF being used for HD starting 11/21  Code Status: Full Family Communication: none at bedside Disposition Plan: home when improved   Consultants:  Renal  Antibiotics:  vancomycin  HPI/Subjective: No complaints  Objective: Filed Vitals:   09/14/13 0742  BP:   Pulse:   Temp: 98.3 F (36.8 C)  Resp:     Intake/Output Summary (Last 24 hours) at 09/14/13 0828 Last data filed at 09/14/13 0300  Gross per 24 hour  Intake    170 ml  Output   2406 ml  Net  -2236 ml   Filed Weights   09/12/13 2101 09/13/13 1752 09/13/13 2233  Weight: 143.019 kg (315 lb 4.8 oz) 142.8 kg (314 lb 13.1 oz) 139.8 kg (308 lb 3.3 oz)    Exam:   General: AAOx3  Cardiovascular: S1s2/RRR  Chest: HD catheter noted  Respiratory: CTAB  Abdomen: soft, NT, BS present  Musculoskeletal: no edema c/c   Data Reviewed: Basic Metabolic Panel:  Recent Labs Lab 09/09/13 1711 09/10/13 1000 09/11/13 0525 09/12/13 0600 09/14/13 0403  NA 136  --  131* 134* 134*  K 4.4  --  4.6 4.1 4.0  CL 94*  --  92* 96 93*  CO2 24  --  20 25 24   GLUCOSE 72  --  87 86 108*  BUN 23  --  52* 26* 26*  CREATININE 10.53* 14.59* 17.73* 11.46* 10.95*  CALCIUM 10.2  --  9.5 9.4 9.8  PHOS  --   --  4.9*   --   --    Liver Function Tests:  Recent Labs Lab 09/09/13 1711  AST 21  ALT 10  ALKPHOS 62  BILITOT 1.0  PROT 9.2*  ALBUMIN 3.7   No results found for this basename: LIPASE, AMYLASE,  in the last 168 hours No results found for this basename: AMMONIA,  in the last 168 hours CBC:  Recent Labs Lab 09/09/13 1711 09/10/13 1000 09/11/13 0525 09/12/13 0600 09/14/13 0403  WBC 12.8* 11.3* 7.8 5.8 5.7  NEUTROABS 10.7*  --   --   --   --   HGB 15.6 13.7 13.8 13.5 13.6  HCT 43.9 38.3* 38.4* 38.2* 38.2*  MCV 89.0 89.3 88.5 88.8 88.8  PLT 167 146* 128* 151 199   Cardiac Enzymes: No results found for this basename: CKTOTAL, CKMB, CKMBINDEX, TROPONINI,  in the last 168 hours BNP (last 3 results) No results found for this basename: PROBNP,  in the last 8760 hours CBG: No results found for this basename: GLUCAP,  in the last 168 hours  Recent Results (from the past 240 hour(s))  CULTURE, BLOOD (ROUTINE X 2)     Status: None   Collection Time    09/09/13  4:48 PM      Result Value  Range Status   Specimen Description BLOOD HAND RIGHT   Final   Special Requests BOTTLES DRAWN AEROBIC ONLY 2.5CC   Final   Culture  Setup Time     Final   Value: 09/09/2013 22:43     Performed at [facility]   Culture     Final   Value: STAPHYLOCOCCUS AUREUS     Note: RIFAMPIN AND GENTAMICIN SHOULD NOT BE USED AS SINGLE DRUGS FOR TREATMENT OF STAPH INFECTIONS.     Note: Gram Stain Report Called to,Read Back By and Verified With: CY HICKS 09/11/13 1415 BY SMITHERSJ     Performed at [facility]   Report Status 09/13/2013 FINAL   Final   Organism ID, Bacteria STAPHYLOCOCCUS AUREUS   Final  CULTURE, BLOOD (ROUTINE X 2)     Status: None   Collection Time    09/09/13  6:31 PM      Result Value Range Status   Specimen Description BLOOD HAND RIGHT   Final   Special Requests BOTTLES DRAWN AEROBIC ONLY 2CC   Final   Culture  Setup Time     Final   Value: 09/10/2013 01:23     Performed  at [facility]   Culture     Final   Value: STAPHYLOCOCCUS AUREUS     Note: SUSCEPTIBILITIES PERFORMED ON PREVIOUS CULTURE WITHIN THE LAST 5 DAYS.     Note: Gram Stain Report Called to,Read Back By and Verified With: CY HICKS 09/11/13 1415 BY SMITHERSJ     Performed at [facility]   Report Status 09/13/2013 FINAL   Final  CLOSTRIDIUM DIFFICILE BY PCR     Status: None   Collection Time    09/11/13  3:28 AM      Result Value Range Status   C difficile by pcr NEGATIVE  NEGATIVE Final  MRSA PCR SCREENING     Status: None   Collection Time    09/11/13  1:04 PM      Result Value Range Status   MRSA by PCR NEGATIVE  NEGATIVE Final   Comment:            The GeneXpert MRSA Assay (FDA     approved for NASAL specimens     only), is one component of a     comprehensive MRSA colonization     surveillance program. It is not     intended to diagnose MRSA     infection nor to guide or     monitor treatment for     MRSA infections.  CATH TIP CULTURE     Status: None   Collection Time    09/12/13  2:10 PM      Result Value Range Status   Specimen Description CATH TIP   Final   Special Requests HD CATH TIP   Final   Culture     Final   Value: Culture reincubated for better growth     Performed at [facility] PENDING   Incomplete  CULTURE, BLOOD (ROUTINE X 2)     Status: None   Collection Time    09/12/13  2:37 PM      Result Value Range Status   Specimen Description BLOOD RIGHT HAND   Final   Special Requests BOTTLES DRAWN AEROBIC ONLY 10CC   Final   Culture  Setup Time     Final   Value: 09/12/2013 20:42     Performed at [facility]   Culture     Final   Value:        BLOOD CULTURE RECEIVED NO GROWTH TO DATE CULTURE WILL BE HELD FOR 5 DAYS BEFORE ISSUING A FINAL NEGATIVE REPORT     Performed at [facility]   Report Status PENDING   Incomplete  CULTURE, BLOOD (ROUTINE X 2)     Status: None   Collection Time    09/12/13   2:44 PM      Result Value Range Status   Specimen Description BLOOD RIGHT FOREARM   Final   Special Requests BOTTLES DRAWN AEROBIC ONLY [facility]   Final   Culture  Setup Time     Final   Value: 09/12/2013 20:40     Performed at [facility]   Culture     Final   Value:        BLOOD CULTURE RECEIVED NO GROWTH TO DATE CULTURE WILL BE HELD FOR 5 DAYS BEFORE ISSUING A FINAL NEGATIVE REPORT     Performed at [facility]   Report Status PENDING   Incomplete     Studies: No results found.  Scheduled Meds: .  ceFAZolin (ANCEF) IV  2 g Intravenous Q M,W,F-1800  . cinacalcet  60 mg Oral QAC breakfast  . famotidine  20 mg Oral Daily  . heparin  5,000 Units Subcutaneous Q8H  . midodrine  5 mg Oral Q M,W,F-HD  . multivitamin  1 tablet Oral QHS  . rifampin  300 mg Oral Daily  . sevelamer carbonate  3,200 mg Oral TID WC   Continuous Infusions:   Active Problems:   End stage renal disease   Fever in adult   SIRS (systemic inflammatory response syndrome)    Time spent: 54min    [facility] Pager [phone]. If 7PM-7AM, please contact night-coverage at [email or web address], password [facility] 09/14/2013, 8:28 AM  LOS: 5 days

## 2013-09-14 NOTE — Discharge Summary (Addendum)
Physician Discharge Summary  [name] [id number] DOB: 05-Jul-1971 DOA: 09/09/2013  PCP: Ulla Potash., MD  Admit date: 09/09/2013 Discharge date: 09/14/2013  Time spent: 50 minutes  Recommendations for Outpatient Follow-up:  1. [facility] in 3weeks 2. STop IV Ancef and Po rifampim on 10/24/13 (for 6 weeks per ID recs)  Discharge Diagnoses:    MSSA Bacteremia   HD catheter related sepsis   End stage renal disease   Fever in adult   Anemia of chronic disease  Discharge Condition: stable  Diet recommendation: Renal diet  Filed Weights   09/12/13 2101 09/13/13 1752 09/13/13 2233  Weight: 143.019 kg (315 lb 4.8 oz) 142.8 kg (314 lb 13.1 oz) 139.8 kg (308 lb 3.3 oz)    History of present illness:  Chief Complaint: fevers  HPI: [name] is a 42 y.o. male  With past medical history of end-stage renal disease, anemia, was brought from the dialysis center by ambulance, at the dialysis center he was given vancomycin and sent to the emergency room. He relates he started having fevers 5 minutes after being connected to dialysis machine chills and chest nauseated. Dialysis was continued he was given a dose of [place] and, in improved. In the emergency room he relates some fever, no cough shortness of breath chest pain culture sores or sick contacts.   Hospital Course:  1. MSSA bacteremia/Sepsis - HD catheter related infection, improving  - currently on IV Ancef with HD and Rifampin Daily, will need this for 6 weeks per ID stop date 10/24/13(from 11/20-first negative cx after HD cath removed)  - ID following  - TEE 11/21 normal no vegetations, 2D ECHO benign  - s/p HD catheter removal 11/20  - repeat Blood Cx negative so far  - [facility] following evaluated AVF and didn't suspect infection at this time   2. Diarrhea  -suspect related to sepsis, abx  -improving  -Cdiff pCR negative   3. ESRD on HD MWF  -s/p HD 11/19pm  -appreciate Renal input  -AVF being used for HD starting  11/21   Discharge Exam: Filed Vitals:   09/14/13 1400  BP: 154/86  Pulse: 90  Temp: 98.2 F (36.8 C)  Resp: 18    General: AAOx3 Cardiovascular: S1S2/RRR Respiratory: CTAB  Discharge Instructions  Discharge Orders   Future Orders Complete By Expires   Discharge instructions  As directed    Comments:     Renal Diet   Increase activity slowly  As directed        Medication List    STOP taking these medications       doxycycline 100 MG EC tablet  Commonly known as:  DORYX      TAKE these medications       b complex-vitamin c-folic acid 0.8 MG Tabs tablet  Take 0.8 mg by mouth daily.     ceFAZolin 2-3 GM-% Solr  Commonly known as:  ANCEF  Inject 50 mLs (2 g total) into the vein every Monday, Wednesday, and Friday at 6 PM.     cinacalcet 60 MG tablet  Commonly known as:  SENSIPAR  Take 60 mg by mouth daily.     diphenhydrAMINE 25 MG tablet  Commonly known as:  BENADRYL  Take 25 mg by mouth every 6 (six) hours as needed for itching.     midodrine 5 MG tablet  Commonly known as:  PROAMATINE  Take 5 mg by mouth as needed. Take 1 capsule in the morning prior to  dialysis to increase blood pressure     ranitidine 150 MG capsule  Commonly known as:  ZANTAC  Take 150 mg by mouth daily as needed for heartburn. For heartburn     RENVELA 800 MG tablet  Generic drug:  sevelamer carbonate  Take 1,600-2,400 mg by mouth 3 (three) times daily with meals. Takes either 1600mg  or 2400mg  with each meal depending on the size of the meal     rifampin 300 MG capsule  Commonly known as:  RIFADIN  Take 1 capsule (300 mg total) by mouth daily.     TYLENOL EXTRA STRENGTH PO  Take 2 tablets by mouth once as needed (headaches).       No Known Allergies     Follow-up Information   Follow up with PATEL,JAY K., MD In 1 week.   Specialty:  Nephrology   Contact information:   [address] [place] [address] [phone]        The results  of significant diagnostics from this hospitalization (including imaging, microbiology, ancillary and laboratory) are listed below for reference.    Significant Diagnostic Studies: Dg Chest Port 1 View  09/09/2013   CLINICAL DATA:  Fever  EXAM: PORTABLE CHEST - 1 VIEW  COMPARISON:  05/03/2013  FINDINGS: A right-sided dialysis catheter is appreciated unchanged when compared to previous study. The cardiac silhouette is mild-to-moderately enlarged. There is prominence of the interstitial markings without peribronchial cuffing. No focal region of consolidation or focal infiltrates are appreciated. EKG leads are appreciated within the right left hemithoraces. The visualized osseous structures are grossly unremarkable.  IMPRESSION: 1. Pulmonary vascular congestion/mild edema 2. No focal regions of consolidation or focal infiltrates.   Electronically Signed   By: Margaree Mackintosh M.D.   On: 09/09/2013 15:33    Microbiology: Recent Results (from the past 240 hour(s))  CULTURE, BLOOD (ROUTINE X 2)     Status: None   Collection Time    09/09/13  4:48 PM      Result Value Range Status   Specimen Description BLOOD HAND RIGHT   Final   Special Requests BOTTLES DRAWN AEROBIC ONLY 2.5CC   Final   Culture  Setup Time     Final   Value: 09/09/2013 22:43     Performed at [facility]   Culture     Final   Value: STAPHYLOCOCCUS AUREUS     Note: RIFAMPIN AND GENTAMICIN SHOULD NOT BE USED AS SINGLE DRUGS FOR TREATMENT OF STAPH INFECTIONS.     Note: Gram Stain Report Called to,Read Back By and Verified With: CY HICKS 09/11/13 1415 BY SMITHERSJ     Performed at [facility]   Report Status 09/13/2013 FINAL   Final   Organism ID, Bacteria STAPHYLOCOCCUS AUREUS   Final  CULTURE, BLOOD (ROUTINE X 2)     Status: None   Collection Time    09/09/13  6:31 PM      Result Value Range Status   Specimen Description BLOOD HAND RIGHT   Final   Special Requests BOTTLES DRAWN AEROBIC ONLY 2CC   Final    Culture  Setup Time     Final   Value: 09/10/2013 01:23     Performed at [facility]   Culture     Final   Value: STAPHYLOCOCCUS AUREUS     Note: SUSCEPTIBILITIES PERFORMED ON PREVIOUS CULTURE WITHIN THE LAST 5 DAYS.     Note: Gram Stain Report Called to,Read Back By and  Verified With: CY HICKS 09/11/13 1415 BY SMITHERSJ     Performed at [facility]   Report Status 09/13/2013 FINAL   Final  CLOSTRIDIUM DIFFICILE BY PCR     Status: None   Collection Time    09/11/13  3:28 AM      Result Value Range Status   C difficile by pcr NEGATIVE  NEGATIVE Final  MRSA PCR SCREENING     Status: None   Collection Time    09/11/13  1:04 PM      Result Value Range Status   MRSA by PCR NEGATIVE  NEGATIVE Final   Comment:            The GeneXpert MRSA Assay (FDA     approved for NASAL specimens     only), is one component of a     comprehensive MRSA colonization     surveillance program. It is not     intended to diagnose MRSA     infection nor to guide or     monitor treatment for     MRSA infections.  CATH TIP CULTURE     Status: None   Collection Time    09/12/13  2:10 PM      Result Value Range Status   Specimen Description CATH TIP   Final   Special Requests HD CATH TIP   Final   Culture     Final   Value: Culture reincubated for better growth     Performed at [facility]   Report Status PENDING   Incomplete  CULTURE, BLOOD (ROUTINE X 2)     Status: None   Collection Time    09/12/13  2:37 PM      Result Value Range Status   Specimen Description BLOOD RIGHT HAND   Final   Special Requests BOTTLES DRAWN AEROBIC ONLY 10CC   Final   Culture  Setup Time     Final   Value: 09/12/2013 20:42     Performed at [facility]   Culture     Final   Value:        BLOOD CULTURE RECEIVED NO GROWTH TO DATE CULTURE WILL BE HELD FOR 5 DAYS BEFORE ISSUING A FINAL NEGATIVE REPORT     Performed at [facility]   Report Status PENDING   Incomplete  CULTURE,  BLOOD (ROUTINE X 2)     Status: None   Collection Time    09/12/13  2:44 PM      Result Value Range Status   Specimen Description BLOOD RIGHT FOREARM   Final   Special Requests BOTTLES DRAWN AEROBIC ONLY [place]   Final   Culture  Setup Time     Final   Value: 09/12/2013 20:40     Performed at [facility]   Culture     Final   Value:        BLOOD CULTURE RECEIVED NO GROWTH TO DATE CULTURE WILL BE HELD FOR 5 DAYS BEFORE ISSUING A FINAL NEGATIVE REPORT     Performed at [facility]   Report Status PENDING   Incomplete     Labs: Basic Metabolic Panel:  Recent Labs Lab 09/09/13 1711 09/10/13 1000 09/11/13 0525 09/12/13 0600 09/14/13 0403  NA 136  --  131* 134* 134*  K 4.4  --  4.6 4.1 4.0  CL 94*  --  92* 96 93*  CO2 24  --  20 25 24   GLUCOSE  72  --  87 86 108*  BUN 23  --  52* 26* 26*  CREATININE 10.53* 14.59* 17.73* 11.46* 10.95*  CALCIUM 10.2  --  9.5 9.4 9.8  PHOS  --   --  4.9*  --   --    Liver Function Tests:  Recent Labs Lab 09/09/13 1711  AST 21  ALT 10  ALKPHOS 62  BILITOT 1.0  PROT 9.2*  ALBUMIN 3.7   No results found for this basename: LIPASE, AMYLASE,  in the last 168 hours No results found for this basename: AMMONIA,  in the last 168 hours CBC:  Recent Labs Lab 09/09/13 1711 09/10/13 1000 09/11/13 0525 09/12/13 0600 09/14/13 0403  WBC 12.8* 11.3* 7.8 5.8 5.7  NEUTROABS 10.7*  --   --   --   --   HGB 15.6 13.7 13.8 13.5 13.6  HCT 43.9 38.3* 38.4* 38.2* 38.2*  MCV 89.0 89.3 88.5 88.8 88.8  PLT 167 146* 128* 151 199   Cardiac Enzymes: No results found for this basename: CKTOTAL, CKMB, CKMBINDEX, TROPONINI,  in the last 168 hours BNP: BNP (last 3 results) No results found for this basename: PROBNP,  in the last 8760 hours CBG: No results found for this basename: GLUCAP,  in the last 168 hours     Signed:  Nicholaus Steinke  [facility] 09/14/2013, 3:55 PM

## 2013-09-14 NOTE — Progress Notes (Signed)
[name] Progress Note  Subjective:   Tolerated HD yest using AVF with good blood flows of 250-300  Objective Filed Vitals:   09/13/13 2328 09/14/13 0535 09/14/13 0742 09/14/13 0845  BP: 117/80 113/77  108/69  Pulse: 102 94  88  Temp: 98.5 F (36.9 C) 98.5 F (36.9 C) 98.3 F (36.8 C) 98.4 F (36.9 C)  TempSrc: Oral Oral Oral Oral  Resp:  17  18  Height:      Weight:      SpO2: 97% 94%  97%   Physical Exam General: NAD Heart: RRR Lungs: no wheezes or rales Abdomen: soft obese Extremities: no LE edmea Dialysis Access: R TDC/ LFA AVF with + bruit, small scab and no overt sign if infx   Dialysis Orders: Center: [facility] on MWF .  EDW 143 HD Bath 1K/2.5Ca Time 5:00 Heparin 10000. Access TDC/ Maturing L AVF BFR 400 DFR A1.5  Hectorol 6 mcg IV/HD Epogen 0 Units IV/HD Venofer 0  Recent Labs: Hgb 13.9, Tsat 40%, P 8.7, PTH 866, K 5.1. Positive blood cultures collected 11/17 - gram positive cocci in clusters   Assessment/Plan:  1. Catheter-associated MSSA Bacteremia - cath out, TEE negative, using AVF w/o difficulty, abx per ID; ok for d/c from renal standpoint 2. ESRD: usual hd is MWF 3. Hypertension/volume - SBPs ok. Known hypotension on HD. Midodrine 5 mg pre-tx, below edw 4. Anemia - Hgb 13.5 consistent with op labs. No ESAs. Last Tsat 40%. No op Fe 5. Metabolic bone disease - Ca 9.4 (9.6 corrected). P 4.9. Last PTH 866. Continue Renvela, Vit D and Sensipar 60 6. Nutrition - Renal diet, add multivitamin  Kelly Splinter MD pager [phone]    cell [phone] 09/14/2013, 10:59 AM     Additional Objective Labs: Basic Metabolic Panel:  Recent Labs Lab 09/09/13 1711  09/11/13 0525 09/12/13 0600 09/14/13 0403  NA 136  --  131* 134* 134*  K 4.4  --  4.6 4.1 4.0  CL 94*  --  92* 96 93*  CO2 24  --  20 25 24   GLUCOSE 72  --  87 86 108*  BUN 23  --  52* 26* 26*  CREATININE 10.53*  < > 17.73* 11.46* 10.95*  CALCIUM 10.2  --  9.5 9.4 9.8  PHOS  --   --   4.9*  --   --   < > = values in this interval not displayed. Liver Function Tests:  Recent Labs Lab 09/09/13 1711  AST 21  ALT 10  ALKPHOS 62  BILITOT 1.0  PROT 9.2*  ALBUMIN 3.7   CBC:  Recent Labs Lab 09/09/13 1711 09/10/13 1000 09/11/13 0525 09/12/13 0600 09/14/13 0403  WBC 12.8* 11.3* 7.8 5.8 5.7  NEUTROABS 10.7*  --   --   --   --   HGB 15.6 13.7 13.8 13.5 13.6  HCT 43.9 38.3* 38.4* 38.2* 38.2*  MCV 89.0 89.3 88.5 88.8 88.8  PLT 167 146* 128* 151 199   Blood Culture    Component Value Date/Time   SDES BLOOD RIGHT FOREARM 09/12/2013 1444   SPECREQUEST BOTTLES DRAWN AEROBIC ONLY [place] 09/12/2013 1444   CULT  Value:        BLOOD CULTURE RECEIVED NO GROWTH TO DATE CULTURE WILL BE HELD FOR 5 DAYS BEFORE ISSUING A FINAL NEGATIVE REPORT Performed at [facility] 09/12/2013 1444   REPTSTATUS PENDING 09/12/2013 1444   Medications:   .  ceFAZolin (ANCEF) IV  2  g Intravenous Q M,W,F-1800  . cinacalcet  60 mg Oral QAC breakfast  . famotidine  20 mg Oral Daily  . heparin  5,000 Units Subcutaneous Q8H  . midodrine  5 mg Oral Q M,W,F-HD  . multivitamin  1 tablet Oral QHS  . rifampin  300 mg Oral Daily  . sevelamer carbonate  3,200 mg Oral TID WC

## 2013-09-14 NOTE — Progress Notes (Signed)
[facility] for Infectious Disease    Day #3 cefazolin Day # 3 rifampin  Subjective: No new complaints   Antibiotics:  Anti-infectives   Start     Dose/Rate Route Frequency Ordered Stop   09/14/13 1000  rifampin (RIFADIN) capsule 300 mg  Status:  Discontinued     300 mg Oral Daily 09/13/13 1542 09/13/13 1543   09/14/13 0000  ceFAZolin (ANCEF) 2-3 GM-% SOLR     2 g 100 mL/hr over 30 Minutes Intravenous Every M-W-F (1800) 09/14/13 1549     09/14/13 0000  rifampin (RIFADIN) 300 MG capsule     300 mg Oral Daily 09/14/13 1549     09/13/13 2000  ceFAZolin (ANCEF) IVPB 2 g/50 mL premix  Status:  Discontinued     2 g 100 mL/hr over 30 Minutes Intravenous Every M-W-F (2000) 09/12/13 1034 09/13/13 1047   09/13/13 1800  ceFAZolin (ANCEF) IVPB 2 g/50 mL premix     2 g 100 mL/hr over 30 Minutes Intravenous Every M-W-F (1800) 09/13/13 1542     09/13/13 1800  rifampin (RIFADIN) capsule 300 mg     300 mg Oral Daily 09/13/13 1543     09/13/13 1300  [MAR Hold]  ceFEPIme (MAXIPIME) 2 g in dextrose 5 % 50 mL IVPB  Status:  Discontinued     (On MAR Hold since 09/13/13 1509)   2 g 100 mL/hr over 30 Minutes Intravenous Every M-W-F (Hemodialysis) 09/13/13 1059 09/13/13 1542   09/12/13 1830  [MAR Hold]  rifampin (RIFADIN) 300 mg in sodium chloride 0.9 % 100 mL IVPB  Status:  Discontinued     (On MAR Hold since 09/13/13 1509)   300 mg 200 mL/hr over 30 Minutes Intravenous Every 24 hours 09/12/13 1718 09/13/13 1542   09/12/13 1000  ceFAZolin (ANCEF) IVPB 2 g/50 mL premix     2 g 100 mL/hr over 30 Minutes Intravenous  Once 09/12/13 0925 09/12/13 1435   09/11/13 1630  vancomycin (VANCOCIN) IVPB 1000 mg/200 mL premix  Status:  Discontinued     1,000 mg 200 mL/hr over 60 Minutes Intravenous Every M-W-F (Hemodialysis) 09/11/13 1541 09/13/13 1049   09/10/13 1000  vancomycin (VANCOCIN) 1,500 mg in sodium chloride 0.9 % 500 mL IVPB     1,500 mg 250 mL/hr over 120 Minutes Intravenous  Once 09/10/13  0912 09/10/13 1836   09/10/13 0645  piperacillin-tazobactam (ZOSYN) IVPB 3.375 g     3.375 g 12.5 mL/hr over 240 Minutes Intravenous  Once 09/10/13 [id number] 09/10/13 0803      Medications: Scheduled Meds: .  ceFAZolin (ANCEF) IV  2 g Intravenous Q M,W,F-1800  . cinacalcet  60 mg Oral QAC breakfast  . famotidine  20 mg Oral Daily  . heparin  5,000 Units Subcutaneous Q8H  . midodrine  5 mg Oral Q M,W,F-HD  . multivitamin  1 tablet Oral QHS  . rifampin  300 mg Oral Daily  . sevelamer carbonate  3,200 mg Oral TID WC   Continuous Infusions:  PRN Meds:.diphenhydrAMINE, loperamide, ondansetron (ZOFRAN) IV, ondansetron, polyethylene glycol   Objective: Weight change: -7.7 oz (-0.219 kg)  Intake/Output Summary (Last 24 hours) at 09/14/13 1659 Last data filed at 09/14/13 0847  Gross per 24 hour  Intake    410 ml  Output   2406 ml  Net  -1996 ml   Blood pressure 154/86, pulse 90, temperature 98.2 F (36.8 C), temperature source Oral, resp. rate 18, height 5\' 11"  (1.803 m), weight 308 lb  3.3 oz (139.8 kg), SpO2 98.00%. Temp:  [98.2 F (36.8 C)-98.5 F (36.9 C)] 98.2 F (36.8 C) (11/22 1400) Pulse Rate:  [81-102] 90 (11/22 1400) Resp:  [14-20] 18 (11/22 1400) BP: (108-161)/(66-114) 154/86 mmHg (11/22 1400) SpO2:  [94 %-100 %] 98 % (11/22 1400) Weight:  [308 lb 3.3 oz (139.8 kg)-314 lb 13.1 oz (142.8 kg)] 308 lb 3.3 oz (139.8 kg) (11/21 2233)  Physical Exam: General: Alert and awake, oriented x3, not in any acute distress. HEENT: anicteric sclera, , EOMI CVS regular rate, normal r,  no murmur rubs or gallops Chest: clear to auscultation bilaterally, no wheezing, rales or rhonchi Abdomen: soft nontender, nondistended, normal bowel sounds, Extremities: no  clubbing or edema noted bilaterally Skin: site on the left where he had "stitch abscess is without active drainage currently Neuro: nonfocal  Lab Results:  Recent Labs  09/12/13 0600 09/14/13 0403  WBC 5.8 5.7  HGB 13.5  13.6  HCT 38.2* 38.2*  PLT 151 199    BMET  Recent Labs  09/12/13 0600 09/14/13 0403  NA 134* 134*  K 4.1 4.0  CL 96 93*  CO2 25 24  GLUCOSE 86 108*  BUN 26* 26*  CREATININE 11.46* 10.95*  CALCIUM 9.4 9.8    Micro Results: Recent Results (from the past 240 hour(s))  CULTURE, BLOOD (ROUTINE X 2)     Status: None   Collection Time    09/09/13  4:48 PM      Result Value Range Status   Specimen Description BLOOD HAND RIGHT   Final   Special Requests BOTTLES DRAWN AEROBIC ONLY 2.5CC   Final   Culture  Setup Time     Final   Value: 09/09/2013 22:43     Performed at [facility]   Culture     Final   Value: STAPHYLOCOCCUS AUREUS     Note: RIFAMPIN AND GENTAMICIN SHOULD NOT BE USED AS SINGLE DRUGS FOR TREATMENT OF STAPH INFECTIONS.     Note: Gram Stain Report Called to,Read Back By and Verified With: CY HICKS 09/11/13 1415 BY SMITHERSJ     Performed at [facility]   Report Status 09/13/2013 FINAL   Final   Organism ID, Bacteria STAPHYLOCOCCUS AUREUS   Final  CULTURE, BLOOD (ROUTINE X 2)     Status: None   Collection Time    09/09/13  6:31 PM      Result Value Range Status   Specimen Description BLOOD HAND RIGHT   Final   Special Requests BOTTLES DRAWN AEROBIC ONLY 2CC   Final   Culture  Setup Time     Final   Value: 09/10/2013 01:23     Performed at [facility]   Culture     Final   Value: STAPHYLOCOCCUS AUREUS     Note: SUSCEPTIBILITIES PERFORMED ON PREVIOUS CULTURE WITHIN THE LAST 5 DAYS.     Note: Gram Stain Report Called to,Read Back By and Verified With: CY HICKS 09/11/13 1415 BY SMITHERSJ     Performed at [facility]   Report Status 09/13/2013 FINAL   Final  CLOSTRIDIUM DIFFICILE BY PCR     Status: None   Collection Time    09/11/13  3:28 AM      Result Value Range Status   C difficile by pcr NEGATIVE  NEGATIVE Final  MRSA PCR SCREENING     Status: None   Collection Time    09/11/13  1:04 PM  Result Value Range Status     MRSA by PCR NEGATIVE  NEGATIVE Final   Comment:            The GeneXpert MRSA Assay (FDA     approved for NASAL specimens     only), is one component of a     comprehensive MRSA colonization     surveillance program. It is not     intended to diagnose MRSA     infection nor to guide or     monitor treatment for     MRSA infections.  CATH TIP CULTURE     Status: None   Collection Time    09/12/13  2:10 PM      Result Value Range Status   Specimen Description CATH TIP   Final   Special Requests HD CATH TIP   Final   Culture     Final   Value: Culture reincubated for better growth     Performed at [place] PENDING   Incomplete  CULTURE, BLOOD (ROUTINE X 2)     Status: None   Collection Time    09/12/13  2:37 PM      Result Value Range Status   Specimen Description BLOOD RIGHT HAND   Final   Special Requests BOTTLES DRAWN AEROBIC ONLY 10CC   Final   Culture  Setup Time     Final   Value: 09/12/2013 20:42     Performed at [facility]   Culture     Final   Value:        BLOOD CULTURE RECEIVED NO GROWTH TO DATE CULTURE WILL BE HELD FOR 5 DAYS BEFORE ISSUING A FINAL NEGATIVE REPORT     Performed at [facility]   Report Status PENDING   Incomplete  CULTURE, BLOOD (ROUTINE X 2)     Status: None   Collection Time    09/12/13  2:44 PM      Result Value Range Status   Specimen Description BLOOD RIGHT FOREARM   Final   Special Requests BOTTLES DRAWN AEROBIC ONLY [facility]   Final   Culture  Setup Time     Final   Value: 09/12/2013 20:40     Performed at [facility]   Culture     Final   Value:        BLOOD CULTURE RECEIVED NO GROWTH TO DATE CULTURE WILL BE HELD FOR 5 DAYS BEFORE ISSUING A FINAL NEGATIVE REPORT     Performed at [facility]   Report Status PENDING   Incomplete    Studies/Results: No results found.    Assessment/Plan: [name] is a 42 y.o. male with hx of ESRD (secondary to HTN), previous AV  fistula abscess 04-22-2013 (s aureus), wound Cx from stitch absces/AVG staph aureus (08-23-13). He was started on po  abx and then ultimately admitted and found to have MSSA bacteremia and sepsis  #1 MSSA Sepsis: TEE negative,  greatly appreciate removal of HD catheter. [facility]VS does not believe that AVG is infected though the hx of stitch absces is concerning  --I would give him 6 weeks of IV ancef with HD and bid rifampin 300mg   --he should followup with [place] in early December and we should also see him after he finishes his abx to check surveillance cultures  #2 Screening:  hiv negative, Hep C negative     LOS: 5 days   Alcide Evener 09/14/2013,  4:59 PM

## 2013-09-14 NOTE — Progress Notes (Signed)
   VASCULAR PROGRESS NOTE  SUBJECTIVE: No complaints  PHYSICAL EXAM: Filed Vitals:   09/13/13 2233 09/13/13 2245 09/13/13 2328 09/14/13 0535  BP: 111/71 112/67 117/80 113/77  Pulse: 99 99 102 94  Temp: 98.4 F (36.9 C)  98.5 F (36.9 C) 98.5 F (36.9 C)  TempSrc: Oral  Oral Oral  Resp: 18 17  17   Height:      Weight: 308 lb 3.3 oz (139.8 kg)     SpO2: 98%  97% 94%   No change in left AVF.  No erythema or drainage. Small eschar unchanged.  LABS: Lab Results  Component Value Date   WBC 5.7 09/14/2013   HGB 13.6 09/14/2013   HCT 38.2* 09/14/2013   MCV 88.8 09/14/2013   PLT 199 09/14/2013   Lab Results  Component Value Date   CREATININE 10.95* 09/14/2013   No results found for this basename: INR, PROTIME   CBG (last 3)  No results found for this basename: GLUCAP,  in the last 72 hours  Active Problems:   End stage renal disease   Fever in adult   SIRS (systemic inflammatory response syndrome)  ASSESSMENT AND PLAN:  * His AVF worked well last PM for HD.  * Currently, I do no think that the left RC AVF is a source of sepsis.   * Would not cannulate near the small eschar until it is healed.    Gae Gallop [id number] 09/14/2013

## 2013-09-16 ENCOUNTER — Encounter (HOSPITAL_COMMUNITY): Payer: Self-pay | Admitting: Cardiovascular Disease

## 2013-09-16 LAB — CATH TIP CULTURE: Culture: 30

## 2013-09-18 LAB — CULTURE, BLOOD (ROUTINE X 2)
Culture: NO GROWTH
Culture: NO GROWTH

## 2013-09-22 DIAGNOSIS — N186 End stage renal disease: Secondary | ICD-10-CM | POA: Diagnosis not present

## 2013-09-23 DIAGNOSIS — N186 End stage renal disease: Secondary | ICD-10-CM | POA: Diagnosis not present

## 2013-09-23 DIAGNOSIS — R7881 Bacteremia: Secondary | ICD-10-CM | POA: Diagnosis not present

## 2013-09-23 DIAGNOSIS — D509 Iron deficiency anemia, unspecified: Secondary | ICD-10-CM | POA: Diagnosis not present

## 2013-09-23 DIAGNOSIS — N2581 Secondary hyperparathyroidism of renal origin: Secondary | ICD-10-CM | POA: Diagnosis not present

## 2013-10-02 ENCOUNTER — Ambulatory Visit (INDEPENDENT_AMBULATORY_CARE_PROVIDER_SITE_OTHER): Payer: Medicare Other | Admitting: Infectious Disease

## 2013-10-02 ENCOUNTER — Encounter: Payer: Self-pay | Admitting: Infectious Disease

## 2013-10-02 VITALS — BP 103/71 | HR 112 | Temp 98.2°F | Wt 325.0 lb

## 2013-10-02 DIAGNOSIS — A4902 Methicillin resistant Staphylococcus aureus infection, unspecified site: Secondary | ICD-10-CM | POA: Diagnosis not present

## 2013-10-02 DIAGNOSIS — A4101 Sepsis due to Methicillin susceptible Staphylococcus aureus: Secondary | ICD-10-CM | POA: Diagnosis not present

## 2013-10-02 DIAGNOSIS — T889XXS Complication of surgical and medical care, unspecified, sequela: Secondary | ICD-10-CM

## 2013-10-02 DIAGNOSIS — T82898A Other specified complication of vascular prosthetic devices, implants and grafts, initial encounter: Secondary | ICD-10-CM | POA: Diagnosis not present

## 2013-10-02 DIAGNOSIS — A419 Sepsis, unspecified organism: Secondary | ICD-10-CM | POA: Diagnosis not present

## 2013-10-02 DIAGNOSIS — T827XXS Infection and inflammatory reaction due to other cardiac and vascular devices, implants and grafts, sequela: Secondary | ICD-10-CM

## 2013-10-02 DIAGNOSIS — I120 Hypertensive chronic kidney disease with stage 5 chronic kidney disease or end stage renal disease: Secondary | ICD-10-CM | POA: Diagnosis not present

## 2013-10-02 DIAGNOSIS — R7881 Bacteremia: Secondary | ICD-10-CM | POA: Diagnosis not present

## 2013-10-02 DIAGNOSIS — N186 End stage renal disease: Secondary | ICD-10-CM | POA: Diagnosis not present

## 2013-10-02 DIAGNOSIS — I517 Cardiomegaly: Secondary | ICD-10-CM | POA: Diagnosis not present

## 2013-10-02 DIAGNOSIS — A4901 Methicillin susceptible Staphylococcus aureus infection, unspecified site: Secondary | ICD-10-CM | POA: Diagnosis not present

## 2013-10-02 DIAGNOSIS — R509 Fever, unspecified: Secondary | ICD-10-CM | POA: Diagnosis not present

## 2013-10-02 DIAGNOSIS — T827XXA Infection and inflammatory reaction due to other cardiac and vascular devices, implants and grafts, initial encounter: Secondary | ICD-10-CM | POA: Diagnosis not present

## 2013-10-02 DIAGNOSIS — R6510 Systemic inflammatory response syndrome (SIRS) of non-infectious origin without acute organ dysfunction: Secondary | ICD-10-CM | POA: Diagnosis not present

## 2013-10-02 NOTE — Progress Notes (Signed)
Subjective:    Patient ID: [name], male    DOB: [birth year], 42 y.o.   MRN: [id number]  HPI  42 y.o. male with hx of ESRD (secondary to HTN), previous AV fistula abscess 04-22-2013 (s aureus), wound Cx from stitch absces/AVG staph aureus (08-23-13). He was started on po abx and then ultimately admitted and found to have MSSA bacteremia and sepsis. HD catheter was removed and repeat blood cultures taken. VVS did not believe the AVG to be infected. TEE was negative. He was sent out on IV ancef with HD and oral rifampin.   He is now receivign HD and antibiotics via site on the left.   He is doing well without complaints.   Review of Systems  Constitutional: Negative for fever, chills, diaphoresis, activity change, appetite change, fatigue and unexpected weight change.  HENT: Negative for congestion, rhinorrhea, sinus pressure, sneezing, sore throat and trouble swallowing.   Eyes: Negative for photophobia and visual disturbance.  Respiratory: Negative for cough, chest tightness, shortness of breath, wheezing and stridor.   Cardiovascular: Negative for chest pain, palpitations and leg swelling.  Gastrointestinal: Negative for nausea, vomiting, abdominal pain, diarrhea, constipation, blood in stool, abdominal distention and anal bleeding.  Genitourinary: Negative for dysuria, hematuria, flank pain and difficulty urinating.  Musculoskeletal: Negative for arthralgias, back pain, gait problem, joint swelling and myalgias.  Skin: Positive for wound. Negative for color change, pallor and rash.  Neurological: Negative for dizziness, tremors, weakness and light-headedness.  Hematological: Negative for adenopathy. Does not bruise/bleed easily.  Psychiatric/Behavioral: Negative for behavioral problems, confusion, sleep disturbance, dysphoric mood, decreased concentration and agitation.       Objective:   Physical Exam  Constitutional: He is oriented to person, place, and time. He appears  well-developed and well-nourished. No distress.  HENT:  Head: Normocephalic and atraumatic.  Mouth/Throat: Oropharynx is clear and moist. No oropharyngeal exudate.  Eyes: Conjunctivae and EOM are normal. Pupils are equal, round, and reactive to light. No scleral icterus.  Neck: Normal range of motion. Neck supple. No JVD present.  Cardiovascular: Normal rate, regular rhythm and normal heart sounds.  Exam reveals no gallop and no friction rub.   No murmur heard. Pulmonary/Chest: Effort normal and breath sounds normal. No respiratory distress. He has no wheezes. He has no rales. He exhibits no tenderness.  Abdominal: He exhibits no distension and no mass. There is no tenderness. There is no rebound and no guarding.  Musculoskeletal: He exhibits no edema and no tenderness.  Lymphadenopathy:    He has no cervical adenopathy.  Neurological: He is alert and oriented to person, place, and time. He has normal reflexes. He exhibits normal muscle tone. Coordination normal.  Skin: Skin is warm and dry. He is not diaphoretic.     Psychiatric: He has a normal mood and affect. His behavior is normal. Judgment and thought content normal.   Skin with healing sites on the right arm and the left arm HD site with bandage. No purulence at either site             Assessment & Plan:   #1 MRSA bacteremia due to to stitch abscess at AV graft site: HD catheter was removed and post catheter cultures have been negative.  --He needs to complete 6 weeks of post catheter removal IV Ancef with dialysis and oral rifampin twice daily. By my calculation this puts his last dose of antibiotics in December 31. We'll see him back in January 2 to check surveillance blood  cultures.  I spent greater than 20 minutes with the patient including greater than 50% of time in face to face counsel of the patient and in coordination of their care.   #2 AV graft vasculature did not feel this was infected need to monitor the  site [place] comes off antibiotics.  #3 MRSA infection: He should have a decolonization prior to any elective procedures or surgeries

## 2013-10-23 DIAGNOSIS — N186 End stage renal disease: Secondary | ICD-10-CM | POA: Diagnosis not present

## 2013-10-25 DIAGNOSIS — N186 End stage renal disease: Secondary | ICD-10-CM | POA: Diagnosis not present

## 2013-10-25 DIAGNOSIS — N2581 Secondary hyperparathyroidism of renal origin: Secondary | ICD-10-CM | POA: Diagnosis not present

## 2013-11-05 ENCOUNTER — Ambulatory Visit: Payer: Medicare Other | Admitting: Infectious Disease

## 2013-11-13 ENCOUNTER — Ambulatory Visit: Payer: Medicare Other | Admitting: Infectious Disease

## 2013-11-23 DIAGNOSIS — N186 End stage renal disease: Secondary | ICD-10-CM | POA: Diagnosis not present

## 2013-11-25 DIAGNOSIS — N2581 Secondary hyperparathyroidism of renal origin: Secondary | ICD-10-CM | POA: Diagnosis not present

## 2013-11-25 DIAGNOSIS — E8779 Other fluid overload: Secondary | ICD-10-CM | POA: Diagnosis not present

## 2013-11-25 DIAGNOSIS — N186 End stage renal disease: Secondary | ICD-10-CM | POA: Diagnosis not present

## 2013-12-12 DIAGNOSIS — H547 Unspecified visual loss: Secondary | ICD-10-CM | POA: Diagnosis not present

## 2013-12-12 DIAGNOSIS — H538 Other visual disturbances: Secondary | ICD-10-CM | POA: Diagnosis not present

## 2013-12-21 DIAGNOSIS — N186 End stage renal disease: Secondary | ICD-10-CM | POA: Diagnosis not present

## 2013-12-23 DIAGNOSIS — N186 End stage renal disease: Secondary | ICD-10-CM | POA: Diagnosis not present

## 2013-12-23 DIAGNOSIS — N2581 Secondary hyperparathyroidism of renal origin: Secondary | ICD-10-CM | POA: Diagnosis not present

## 2014-01-21 DIAGNOSIS — N186 End stage renal disease: Secondary | ICD-10-CM | POA: Diagnosis not present

## 2014-01-22 DIAGNOSIS — N186 End stage renal disease: Secondary | ICD-10-CM | POA: Diagnosis not present

## 2014-01-22 DIAGNOSIS — E8779 Other fluid overload: Secondary | ICD-10-CM | POA: Diagnosis not present

## 2014-01-22 DIAGNOSIS — N2581 Secondary hyperparathyroidism of renal origin: Secondary | ICD-10-CM | POA: Diagnosis not present

## 2014-01-25 ENCOUNTER — Emergency Department (HOSPITAL_COMMUNITY)
Admission: EM | Admit: 2014-01-25 | Discharge: 2014-01-25 | Disposition: A | Discharge status: Home / Self Care | Payer: Medicare Other | Attending: Emergency Medicine | Admitting: Emergency Medicine

## 2014-01-25 ENCOUNTER — Encounter (HOSPITAL_COMMUNITY): Payer: Self-pay | Admitting: Emergency Medicine

## 2014-01-25 DIAGNOSIS — Z862 Personal history of diseases of the blood and blood-forming organs and certain disorders involving the immune mechanism: Secondary | ICD-10-CM | POA: Diagnosis not present

## 2014-01-25 DIAGNOSIS — Z992 Dependence on renal dialysis: Secondary | ICD-10-CM | POA: Diagnosis not present

## 2014-01-25 DIAGNOSIS — R112 Nausea with vomiting, unspecified: Secondary | ICD-10-CM | POA: Diagnosis not present

## 2014-01-25 DIAGNOSIS — E663 Overweight: Secondary | ICD-10-CM | POA: Diagnosis not present

## 2014-01-25 DIAGNOSIS — N186 End stage renal disease: Secondary | ICD-10-CM | POA: Insufficient documentation

## 2014-01-25 DIAGNOSIS — I120 Hypertensive chronic kidney disease with stage 5 chronic kidney disease or end stage renal disease: Secondary | ICD-10-CM | POA: Insufficient documentation

## 2014-01-25 DIAGNOSIS — K219 Gastro-esophageal reflux disease without esophagitis: Secondary | ICD-10-CM | POA: Diagnosis not present

## 2014-01-25 DIAGNOSIS — Z79899 Other long term (current) drug therapy: Secondary | ICD-10-CM | POA: Diagnosis not present

## 2014-01-25 DIAGNOSIS — F17200 Nicotine dependence, unspecified, uncomplicated: Secondary | ICD-10-CM | POA: Diagnosis not present

## 2014-01-25 DIAGNOSIS — R000 Tachycardia, unspecified: Secondary | ICD-10-CM | POA: Diagnosis not present

## 2014-01-25 DIAGNOSIS — R197 Diarrhea, unspecified: Secondary | ICD-10-CM | POA: Diagnosis not present

## 2014-01-25 DIAGNOSIS — I10 Essential (primary) hypertension: Secondary | ICD-10-CM | POA: Diagnosis not present

## 2014-01-25 LAB — COMPREHENSIVE METABOLIC PANEL
ALT: 8 U/L (ref 0–53)
AST: 12 U/L (ref 0–37)
Albumin: 3.5 g/dL (ref 3.5–5.2)
Alkaline Phosphatase: 42 U/L (ref 39–117)
BUN: 26 mg/dL — ABNORMAL HIGH (ref 6–23)
CALCIUM: 10.7 mg/dL — AB (ref 8.4–10.5)
CO2: 27 mEq/L (ref 19–32)
CREATININE: 11.64 mg/dL — AB (ref 0.50–1.35)
Chloride: 95 mEq/L — ABNORMAL LOW (ref 96–112)
GFR calc Af Amer: 5 mL/min — ABNORMAL LOW (ref >90)
GFR calc non Af Amer: 5 mL/min — ABNORMAL LOW (ref >90)
Glucose, Bld: 93 mg/dL (ref 70–99)
Potassium: 4.7 mEq/L (ref 3.7–5.3)
SODIUM: 140 meq/L (ref 137–147)
Total Bilirubin: 0.9 mg/dL (ref 0.3–1.2)
Total Protein: 8.3 g/dL (ref 6.0–8.3)

## 2014-01-25 LAB — CBC WITH DIFFERENTIAL/PLATELET
BASOS PCT: 0 % (ref 0–1)
Basophils Absolute: 0 10*3/uL (ref 0.0–0.1)
Eosinophils Absolute: 0.1 10*3/uL (ref 0.0–0.7)
Eosinophils Relative: 1 % (ref 0–5)
HEMATOCRIT: 42.5 % (ref 39.0–52.0)
Hemoglobin: 15.2 g/dL (ref 13.0–17.0)
Lymphocytes Relative: 19 % (ref 12–46)
Lymphs Abs: 2.4 10*3/uL (ref 0.7–4.0)
MCH: 33.7 pg (ref 26.0–34.0)
MCHC: 35.8 g/dL (ref 30.0–36.0)
MCV: 94.2 fL (ref 78.0–100.0)
MONO ABS: 1.3 10*3/uL — AB (ref 0.1–1.0)
Monocytes Relative: 10 % (ref 3–12)
NEUTROS ABS: 8.7 10*3/uL — AB (ref 1.7–7.7)
Neutrophils Relative %: 70 % (ref 43–77)
PLATELETS: 237 10*3/uL (ref 150–400)
RBC: 4.51 MIL/uL (ref 4.22–5.81)
RDW: 15.3 % (ref 11.5–15.5)
WBC: 12.4 10*3/uL — ABNORMAL HIGH (ref 4.0–10.5)

## 2014-01-25 LAB — LIPASE, BLOOD: Lipase: 46 U/L (ref 11–59)

## 2014-01-25 MED ORDER — LOPERAMIDE HCL 2 MG PO CAPS: 2.0000 mg | ORAL_CAPSULE | ORAL | Status: DC | PRN

## 2014-01-25 MED ORDER — ONDANSETRON HCL 4 MG/2ML IJ SOLN: 4.0000 mg | Freq: Once | INTRAMUSCULAR | Status: DC

## 2014-01-25 MED ORDER — ONDANSETRON 4 MG PO TBDP
4.0000 mg | ORAL_TABLET | Freq: Once | ORAL | Status: AC
  Administered 2014-01-25: 4 mg via ORAL
  Filled 2014-01-25: qty 1

## 2014-01-25 MED ORDER — PROMETHAZINE HCL 25 MG PO TABS: 25.0000 mg | ORAL_TABLET | Freq: Four times a day (QID) | ORAL | Status: DC | PRN

## 2014-01-25 MED ORDER — LOPERAMIDE HCL 2 MG PO CAPS
4.0000 mg | ORAL_CAPSULE | Freq: Once | ORAL | Status: AC
  Administered 2014-01-25: 4 mg via ORAL
  Filled 2014-01-25: qty 2

## 2014-01-25 MED ORDER — MORPHINE SULFATE 4 MG/ML IJ SOLN: 4.0000 mg | Freq: Once | INTRAMUSCULAR | Status: DC

## 2014-01-25 NOTE — ED Notes (Signed)
Pt drinking ginger ale at the time without difficulty. Vital signs stable. No signs of distress noted. EDP at bedside.

## 2014-01-25 NOTE — ED Provider Notes (Signed)
CSN: [id number]     Arrival date & time 01/25/14  0757 History   First MD Initiated Contact with Patient 01/25/14 [phone]     Chief Complaint  Patient presents with  . Abdominal Pain     (Consider location/radiation/quality/duration/timing/severity/associated sxs/prior Treatment) HPI  This is a 43 year old male with history of end-stage renal disease, anemia, GERD who dialysis on Monday Wednesday and Friday who presents with abdominal pain. His reports a two-day history of worsening epigastric and diffuse abdominal pain. He reports that it is crampy and nonradiating. He endorses nausea without vomiting. She also endorses multiple loose stools. No blood noted. He denies any sick contacts or fevers. Last dialyzed yesterday. Patient states the pain gets worse with food and he endorses anorexia. Currently his pain is 4/10.  Past Medical History  Diagnosis Date  . Chronic kidney disease   . Hypertension   . Hemodialysis finding May 2 ,2005    First HD at [facility].  . Anemia     Iron deficiency  . End stage renal disease on dialysis   . Aneurysm 02/15/2007    Right arm large aneurysm  . GERD (gastroesophageal reflux disease)    Past Surgical History  Procedure Laterality Date  . Ligation of arteriovenous  fistula Right 04/22/2013    Procedure: LIGATION OF ARTERIOVENOUS  FISTULA  AND RESECTION OF VENOUS ANEURYSM AND ABSCESS;  Surgeon: Rosetta Posner, MD;  Location: [facility];  Service: Vascular;  Laterality: Right;  . Insertion of dialysis catheter Right 04/22/2013    Procedure: INSERTION OF DIALYSIS CATHETER;  Surgeon: Rosetta Posner, MD;  Location: [place];  Service: Vascular;  Laterality: Right;  . Av fistula placement    . Colonscopy    . Colonoscopy      Hxl: of  . Av fistula placement Left 05/03/2013    Procedure: ARTERIOVENOUS (AV) FISTULA CREATION;  Surgeon: Rosetta Posner, MD;  Location: [place];  Service: Vascular;  Laterality: Left;  . Fistula superficialization Left 07/24/2013   Procedure: FISTULA SUPERFICIALIZATION AND LIGATION OF BRANCHES;  Surgeon: Rosetta Posner, MD;  Location: [place];  Service: Vascular;  Laterality: Left;  . Tee without cardioversion N/A 09/13/2013    Procedure: TRANSESOPHAGEAL ECHOCARDIOGRAM (TEE);  Surgeon: Sanda Klein, MD;  Location: [facility] ENDOSCOPY;  Service: Cardiovascular;  Laterality: N/A;   Family History  Problem Relation Age of Onset  . Hypertension Mother   . Heart disease Mother     before age 76  . Other Brother     DVT   History  Substance Use Topics  . Smoking status: Light Tobacco Smoker    Types: Cigars  . Smokeless tobacco: Never Used     Comment: pt states he smokes about 3 cigars per year  . Alcohol Use: Yes     Comment: social    Review of Systems  Constitutional: Negative.  Negative for fever and chills.  Respiratory: Negative.  Negative for chest tightness and shortness of breath.   Cardiovascular: Negative.  Negative for chest pain.  Gastrointestinal: Positive for nausea, abdominal pain and diarrhea. Negative for vomiting.  Genitourinary: Negative.        Anuria  Musculoskeletal: Negative for back pain.  Neurological: Negative for headaches.  All other systems reviewed and are negative.      Allergies  Review of patient's allergies indicates no known allergies.  Home Medications   Current Outpatient Rx  Name  Route  Sig  Dispense  Refill  . acetaminophen (TYLENOL)  500 MG tablet   Oral   Take 1,000 mg by mouth every 6 (six) hours as needed for mild pain.         Marland Kitchen b complex-vitamin c-folic acid (NEPHRO-VITE) 0.8 MG TABS   Oral   Take 0.8 mg by mouth daily.         . cinacalcet (SENSIPAR) 60 MG tablet   Oral   Take 60 mg by mouth daily.         . midodrine (PROAMATINE) 5 MG tablet   Oral   Take 5 mg by mouth every Monday, Wednesday, and Friday. Take 1 capsule in the morning prior to dialysis to increase blood pressure         . ranitidine (ZANTAC) 150 MG capsule   Oral   Take 150  mg by mouth daily as needed for heartburn. For heartburn         . sevelamer (RENVELA) 800 MG tablet   Oral   Take 1,600-2,400 mg by mouth 3 (three) times daily with meals. Takes either 1600mg  or 2400mg  with each meal depending on the size of the meal         . loperamide (IMODIUM) 2 MG capsule   Oral   Take 1 capsule (2 mg total) by mouth as needed for diarrhea or loose stools.   10 capsule   0   . promethazine (PHENERGAN) 25 MG tablet   Oral   Take 1 tablet (25 mg total) by mouth every 6 (six) hours as needed for nausea or vomiting.   30 tablet   0    BP 126/59  Pulse 58  Temp(Src) 98.3 F (36.8 C) (Oral)  Resp 18  SpO2 97% Physical Exam  Nursing note and vitals reviewed. Constitutional: He is oriented to person, place, and time. He appears well-developed and well-nourished. No distress.  Overweight  HENT:  Head: Normocephalic and atraumatic.  Mouth/Throat: Oropharynx is clear and moist.  Eyes: Pupils are equal, round, and reactive to light.  Neck: Neck supple.  Cardiovascular: Regular rhythm and normal heart sounds.   No murmur heard. Tachycardia  Pulmonary/Chest: Effort normal and breath sounds normal. No respiratory distress. He has no wheezes.  Abdominal: Soft. Bowel sounds are normal. There is tenderness. There is no rebound and no guarding.  Tenderness to palpation of the epigastrium and right upper quadrant  Musculoskeletal: He exhibits edema.  Trace bilateral lower extremity edema  Lymphadenopathy:    He has no cervical adenopathy.  Neurological: He is alert and oriented to person, place, and time.  Skin: Skin is warm and dry.  Fistula noted left upper extremity  Psychiatric: He has a normal mood and affect.    ED Course  Procedures (including critical care time) Labs Review Labs Reviewed  CBC WITH DIFFERENTIAL - Abnormal; Notable for the following:    WBC 12.4 (*)    Neutro Abs 8.7 (*)    Monocytes Absolute 1.3 (*)    All other components  within normal limits  COMPREHENSIVE METABOLIC PANEL - Abnormal; Notable for the following:    Chloride 95 (*)    BUN 26 (*)    Creatinine, Ser 11.64 (*)    Calcium 10.7 (*)    GFR calc non Af Amer 5 (*)    GFR calc Af Amer 5 (*)    All other components within normal limits  LIPASE, BLOOD   Imaging Review No results found.   EKG Interpretation None      MDM  Final diagnoses:  Nausea vomiting and diarrhea    Patient presents with nausea, diarrhea, and abdominal pain. He is nontoxic-appearing on exam. Initial vital signs notable for tachycardia 110. Otherwise patient's exam is benign including abdominal exam. He has no significant tenderness or evidence of peritonitis. Lab work is notable for elevated creatinine and BUN;, patient is on dialysis. K. is 4.7. Patient was given Imodium and Zofran with some improvement of his symptoms. He has had no recurrent abdominal pain. Patient does have a mild leukocytosis of 12.4 which is of unknown clinical significance. Repeat abdominal exam continues to be benign. Suspect patient's symptoms are likely secondary to gastroenteritis. Discussed with patient precautions regarding dehydration. Will be discharged home on Phenergan and Imodium.  After history, exam, and medical workup I feel the patient has been appropriately medically screened and is safe for discharge home. Pertinent diagnoses were discussed with the patient. Patient was given return precautions.     Merryl Hacker, MD 01/25/14 1050

## 2014-01-25 NOTE — ED Notes (Signed)
Pt presents to department for evaluation of epigastric pain and diarrhea. Ongoing x2 days. Fistula to L lower arm, HD on Monday, Wednesday and Friday. 4/10 pain upon arrival to ED. Pt is alert and oriented x4. NAD.

## 2014-01-25 NOTE — Discharge Instructions (Signed)
Viral Gastroenteritis Viral gastroenteritis is also known as stomach flu. This condition affects the stomach and intestinal tract. It can cause sudden diarrhea and vomiting. The illness typically lasts 3 to 8 days. Most people develop an immune response that eventually gets rid of the virus. While this natural response develops, the virus can make you quite ill. CAUSES  Many different viruses can cause gastroenteritis, such as rotavirus or noroviruses. You can catch one of these viruses by consuming contaminated food or water. You may also catch a virus by sharing utensils or other personal items with an infected person or by touching a contaminated surface. SYMPTOMS  The most common symptoms are diarrhea and vomiting. These problems can cause a severe loss of body fluids (dehydration) and a body salt (electrolyte) imbalance. Other symptoms may include:  Fever.  Headache.  Fatigue.  Abdominal pain. DIAGNOSIS  Your caregiver can usually diagnose viral gastroenteritis based on your symptoms and a physical exam. A stool sample may also be taken to test for the presence of viruses or other infections. TREATMENT  This illness typically goes away on its own. Treatments are aimed at rehydration. The most serious cases of viral gastroenteritis involve vomiting so severely that you are not able to keep fluids down. In these cases, fluids must be given through an intravenous line (IV). HOME CARE INSTRUCTIONS   Drink enough fluids to keep your urine clear or pale yellow. Drink small amounts of fluids frequently and increase the amounts as tolerated.  Ask your caregiver for specific rehydration instructions.  Avoid:  Foods high in sugar.  Alcohol.  Carbonated drinks.  Tobacco.  Juice.  Caffeine drinks.  Extremely hot or cold fluids.  Fatty, greasy foods.  Too much intake of anything at one time.  Dairy products until 24 to 48 hours after diarrhea stops.  You may consume probiotics.  Probiotics are active cultures of beneficial bacteria. They may lessen the amount and number of diarrheal stools in adults. Probiotics can be found in yogurt with active cultures and in supplements.  Wash your hands well to avoid spreading the virus.  Only take over-the-counter or prescription medicines for pain, discomfort, or fever as directed by your caregiver. Do not give aspirin to children. Antidiarrheal medicines are not recommended.  Ask your caregiver if you should continue to take your regular prescribed and over-the-counter medicines.  Keep all follow-up appointments as directed by your caregiver. SEEK IMMEDIATE MEDICAL CARE IF:   You are unable to keep fluids down.  You do not urinate at least once every 6 to 8 hours.  You develop shortness of breath.  You notice blood in your stool or vomit. This may look like coffee grounds.  You have abdominal pain that increases or is concentrated in one small area (localized).  You have persistent vomiting or diarrhea.  You have a fever.  The patient is a child younger than 3 months, and he or she has a fever.  The patient is a child older than 3 months, and he or she has a fever and persistent symptoms.  The patient is a child older than 3 months, and he or she has a fever and symptoms suddenly get worse.  The patient is a baby, and he or she has no tears when crying. MAKE SURE YOU:   Understand these instructions.  Will watch your condition.  Will get help right away if you are not doing well or get worse. Document Released: 10/10/2005 Document Revised: 01/02/2012 Document Reviewed: 07/27/2011   [employer] Patient Information 2014 [employer].  

## 2014-02-20 DIAGNOSIS — N186 End stage renal disease: Secondary | ICD-10-CM | POA: Diagnosis not present

## 2014-02-21 DIAGNOSIS — N2581 Secondary hyperparathyroidism of renal origin: Secondary | ICD-10-CM | POA: Diagnosis not present

## 2014-02-21 DIAGNOSIS — N186 End stage renal disease: Secondary | ICD-10-CM | POA: Diagnosis not present

## 2014-02-21 DIAGNOSIS — E8779 Other fluid overload: Secondary | ICD-10-CM | POA: Diagnosis not present

## 2014-03-23 DIAGNOSIS — N186 End stage renal disease: Secondary | ICD-10-CM | POA: Diagnosis not present

## 2014-03-24 DIAGNOSIS — N186 End stage renal disease: Secondary | ICD-10-CM | POA: Diagnosis not present

## 2014-03-24 DIAGNOSIS — N2581 Secondary hyperparathyroidism of renal origin: Secondary | ICD-10-CM | POA: Diagnosis not present

## 2014-04-22 DIAGNOSIS — N186 End stage renal disease: Secondary | ICD-10-CM | POA: Diagnosis not present

## 2014-04-23 DIAGNOSIS — N186 End stage renal disease: Secondary | ICD-10-CM | POA: Diagnosis not present

## 2014-04-23 DIAGNOSIS — N2581 Secondary hyperparathyroidism of renal origin: Secondary | ICD-10-CM | POA: Diagnosis not present

## 2014-05-23 DIAGNOSIS — N186 End stage renal disease: Secondary | ICD-10-CM | POA: Diagnosis not present

## 2014-05-26 DIAGNOSIS — N2581 Secondary hyperparathyroidism of renal origin: Secondary | ICD-10-CM | POA: Diagnosis not present

## 2014-05-26 DIAGNOSIS — N186 End stage renal disease: Secondary | ICD-10-CM | POA: Diagnosis not present

## 2014-05-28 DIAGNOSIS — N186 End stage renal disease: Secondary | ICD-10-CM | POA: Diagnosis not present

## 2014-05-28 DIAGNOSIS — N2581 Secondary hyperparathyroidism of renal origin: Secondary | ICD-10-CM | POA: Diagnosis not present

## 2014-05-30 DIAGNOSIS — N186 End stage renal disease: Secondary | ICD-10-CM | POA: Diagnosis not present

## 2014-05-30 DIAGNOSIS — N2581 Secondary hyperparathyroidism of renal origin: Secondary | ICD-10-CM | POA: Diagnosis not present

## 2014-06-02 DIAGNOSIS — N186 End stage renal disease: Secondary | ICD-10-CM | POA: Diagnosis not present

## 2014-06-02 DIAGNOSIS — N2581 Secondary hyperparathyroidism of renal origin: Secondary | ICD-10-CM | POA: Diagnosis not present

## 2014-06-04 DIAGNOSIS — N2581 Secondary hyperparathyroidism of renal origin: Secondary | ICD-10-CM | POA: Diagnosis not present

## 2014-06-04 DIAGNOSIS — N186 End stage renal disease: Secondary | ICD-10-CM | POA: Diagnosis not present

## 2014-06-06 DIAGNOSIS — N2581 Secondary hyperparathyroidism of renal origin: Secondary | ICD-10-CM | POA: Diagnosis not present

## 2014-06-06 DIAGNOSIS — N186 End stage renal disease: Secondary | ICD-10-CM | POA: Diagnosis not present

## 2014-06-09 DIAGNOSIS — N186 End stage renal disease: Secondary | ICD-10-CM | POA: Diagnosis not present

## 2014-06-09 DIAGNOSIS — N2581 Secondary hyperparathyroidism of renal origin: Secondary | ICD-10-CM | POA: Diagnosis not present

## 2014-06-11 DIAGNOSIS — N2581 Secondary hyperparathyroidism of renal origin: Secondary | ICD-10-CM | POA: Diagnosis not present

## 2014-06-11 DIAGNOSIS — N186 End stage renal disease: Secondary | ICD-10-CM | POA: Diagnosis not present

## 2014-06-13 DIAGNOSIS — N186 End stage renal disease: Secondary | ICD-10-CM | POA: Diagnosis not present

## 2014-06-13 DIAGNOSIS — N2581 Secondary hyperparathyroidism of renal origin: Secondary | ICD-10-CM | POA: Diagnosis not present

## 2014-06-16 DIAGNOSIS — N2581 Secondary hyperparathyroidism of renal origin: Secondary | ICD-10-CM | POA: Diagnosis not present

## 2014-06-16 DIAGNOSIS — N186 End stage renal disease: Secondary | ICD-10-CM | POA: Diagnosis not present

## 2014-06-18 DIAGNOSIS — N2581 Secondary hyperparathyroidism of renal origin: Secondary | ICD-10-CM | POA: Diagnosis not present

## 2014-06-18 DIAGNOSIS — N186 End stage renal disease: Secondary | ICD-10-CM | POA: Diagnosis not present

## 2014-06-20 DIAGNOSIS — N186 End stage renal disease: Secondary | ICD-10-CM | POA: Diagnosis not present

## 2014-06-20 DIAGNOSIS — N2581 Secondary hyperparathyroidism of renal origin: Secondary | ICD-10-CM | POA: Diagnosis not present

## 2014-06-23 DIAGNOSIS — N2581 Secondary hyperparathyroidism of renal origin: Secondary | ICD-10-CM | POA: Diagnosis not present

## 2014-06-23 DIAGNOSIS — N186 End stage renal disease: Secondary | ICD-10-CM | POA: Diagnosis not present

## 2014-06-25 DIAGNOSIS — N2581 Secondary hyperparathyroidism of renal origin: Secondary | ICD-10-CM | POA: Diagnosis not present

## 2014-06-25 DIAGNOSIS — N186 End stage renal disease: Secondary | ICD-10-CM | POA: Diagnosis not present

## 2014-06-25 DIAGNOSIS — D509 Iron deficiency anemia, unspecified: Secondary | ICD-10-CM | POA: Diagnosis not present

## 2014-06-27 DIAGNOSIS — D509 Iron deficiency anemia, unspecified: Secondary | ICD-10-CM | POA: Diagnosis not present

## 2014-06-27 DIAGNOSIS — N2581 Secondary hyperparathyroidism of renal origin: Secondary | ICD-10-CM | POA: Diagnosis not present

## 2014-06-27 DIAGNOSIS — N186 End stage renal disease: Secondary | ICD-10-CM | POA: Diagnosis not present

## 2014-06-30 DIAGNOSIS — D509 Iron deficiency anemia, unspecified: Secondary | ICD-10-CM | POA: Diagnosis not present

## 2014-06-30 DIAGNOSIS — N186 End stage renal disease: Secondary | ICD-10-CM | POA: Diagnosis not present

## 2014-06-30 DIAGNOSIS — N2581 Secondary hyperparathyroidism of renal origin: Secondary | ICD-10-CM | POA: Diagnosis not present

## 2014-07-02 DIAGNOSIS — N186 End stage renal disease: Secondary | ICD-10-CM | POA: Diagnosis not present

## 2014-07-02 DIAGNOSIS — D509 Iron deficiency anemia, unspecified: Secondary | ICD-10-CM | POA: Diagnosis not present

## 2014-07-02 DIAGNOSIS — N2581 Secondary hyperparathyroidism of renal origin: Secondary | ICD-10-CM | POA: Diagnosis not present

## 2014-07-04 DIAGNOSIS — N186 End stage renal disease: Secondary | ICD-10-CM | POA: Diagnosis not present

## 2014-07-04 DIAGNOSIS — N2581 Secondary hyperparathyroidism of renal origin: Secondary | ICD-10-CM | POA: Diagnosis not present

## 2014-07-04 DIAGNOSIS — D509 Iron deficiency anemia, unspecified: Secondary | ICD-10-CM | POA: Diagnosis not present

## 2014-07-07 DIAGNOSIS — N186 End stage renal disease: Secondary | ICD-10-CM | POA: Diagnosis not present

## 2014-07-07 DIAGNOSIS — D509 Iron deficiency anemia, unspecified: Secondary | ICD-10-CM | POA: Diagnosis not present

## 2014-07-07 DIAGNOSIS — N2581 Secondary hyperparathyroidism of renal origin: Secondary | ICD-10-CM | POA: Diagnosis not present

## 2014-07-09 DIAGNOSIS — N2581 Secondary hyperparathyroidism of renal origin: Secondary | ICD-10-CM | POA: Diagnosis not present

## 2014-07-09 DIAGNOSIS — D509 Iron deficiency anemia, unspecified: Secondary | ICD-10-CM | POA: Diagnosis not present

## 2014-07-09 DIAGNOSIS — N186 End stage renal disease: Secondary | ICD-10-CM | POA: Diagnosis not present

## 2014-07-10 ENCOUNTER — Encounter: Payer: Self-pay | Admitting: Family

## 2014-07-10 ENCOUNTER — Ambulatory Visit (INDEPENDENT_AMBULATORY_CARE_PROVIDER_SITE_OTHER): Payer: Medicare Other | Admitting: Family

## 2014-07-10 VITALS — BP 82/62 | HR 102 | Resp 16 | Ht 71.5 in | Wt 341.0 lb

## 2014-07-10 DIAGNOSIS — T82898A Other specified complication of vascular prosthetic devices, implants and grafts, initial encounter: Secondary | ICD-10-CM

## 2014-07-10 DIAGNOSIS — T8189XA Other complications of procedures, not elsewhere classified, initial encounter: Secondary | ICD-10-CM

## 2014-07-10 NOTE — Progress Notes (Signed)
Established Dialysis Access  History of Present Illness  [name] is a 43 y.o. (November 27, 1970) male patient of Dr.Early. He is s/p arteriovenous left upper arm (AV) fistula creation on 05/03/13 and S/P left upper arm AVF Superfic. & Ligation of branches on 08-02-13. He presents today with  C/O Left AVF non-healing wound for 1-2 wks. He states that he scrubs his left forearm AVF hard daily in his shower which also happened in his right upper arm fistula, he denies fever or chills, denies drainage. Pt reports occasional left hand numbness that seems positional, relieved by shaking his left hand. He dialyzes M-W-F. He has had symptoms of post nasal drip in the last 2 days, taking OTC cold preparations.    Past Medical History  Diagnosis Date  . Chronic kidney disease   . Hypertension   . Hemodialysis finding May 2 ,2005    First HD at [facility].  . Anemia     Iron deficiency  . End stage renal disease on dialysis   . Aneurysm 02/15/2007    Right arm large aneurysm  . GERD (gastroesophageal reflux disease)     Social History History  Substance Use Topics  . Smoking status: Light Tobacco Smoker    Types: Cigars  . Smokeless tobacco: Never Used     Comment: pt states he smokes about 3 cigars per year  . Alcohol Use: Yes     Comment: social    Family History Family History  Problem Relation Age of Onset  . Hypertension Mother   . Heart disease Mother     before age 62  . Other Brother     DVT    Surgical History Past Surgical History  Procedure Laterality Date  . Ligation of arteriovenous  fistula Right 04/22/2013    Procedure: LIGATION OF ARTERIOVENOUS  FISTULA  AND RESECTION OF VENOUS ANEURYSM AND ABSCESS;  Surgeon: Rosetta Posner, MD;  Location: [place];  Service: Vascular;  Laterality: Right;  . Insertion of dialysis catheter Right 04/22/2013    Procedure: INSERTION OF DIALYSIS CATHETER;  Surgeon: Rosetta Posner, MD;  Location: [place];  Service: Vascular;   Laterality: Right;  . Av fistula placement    . Colonscopy    . Colonoscopy      Hxl: of  . Av fistula placement Left 05/03/2013    Procedure: ARTERIOVENOUS (AV) FISTULA CREATION;  Surgeon: Rosetta Posner, MD;  Location: [place];  Service: Vascular;  Laterality: Left;  . Fistula superficialization Left 07/24/2013    Procedure: FISTULA SUPERFICIALIZATION AND LIGATION OF BRANCHES;  Surgeon: Rosetta Posner, MD;  Location: [place];  Service: Vascular;  Laterality: Left;  . Tee without cardioversion N/A 09/13/2013    Procedure: TRANSESOPHAGEAL ECHOCARDIOGRAM (TEE);  Surgeon: Sanda Klein, MD;  Location: [facility] ENDOSCOPY;  Service: Cardiovascular;  Laterality: N/A;    No Known Allergies  Current Outpatient Prescriptions  Medication Sig Dispense Refill  . acetaminophen (TYLENOL) 500 MG tablet Take 1,000 mg by mouth every 6 (six) hours as needed for mild pain.      Marland Kitchen b complex-vitamin c-folic acid (NEPHRO-VITE) 0.8 MG TABS Take 0.8 mg by mouth daily.      . cinacalcet (SENSIPAR) 60 MG tablet Take 60 mg by mouth daily.      Marland Kitchen loperamide (IMODIUM) 2 MG capsule Take 1 capsule (2 mg total) by mouth as needed for diarrhea or loose stools.  10 capsule  0  . midodrine (PROAMATINE) 5 MG  tablet Take 5 mg by mouth every Monday, Wednesday, and Friday. Take 1 capsule in the morning prior to dialysis to increase blood pressure      . promethazine (PHENERGAN) 25 MG tablet Take 1 tablet (25 mg total) by mouth every 6 (six) hours as needed for nausea or vomiting.  30 tablet  0  . ranitidine (ZANTAC) 150 MG capsule Take 150 mg by mouth daily as needed for heartburn. For heartburn      . VELPHORO 500 MG chewable tablet 3 (three) times daily with meals. Trial medication      . sevelamer (RENVELA) 800 MG tablet Take 1,600-2,400 mg by mouth 3 (three) times daily with meals. Takes either 1600mg  or 2400mg  with each meal depending on the size of the meal       No current facility-administered medications for this visit.      REVIEW OF SYSTEMS: see HPI for pertinent positives and negatives    PHYSICAL EXAMINATION:  Filed Vitals:   07/10/14 1445  BP: 82/62  Pulse: 102  Resp: 16  Height: 5' 11.5" (1.816 m)  Weight: 341 lb (154.677 kg)  SpO2: 96%   Body mass index is 46.9 kg/(m^2).  General: The patient appears their stated age. Morbidly obese male.  HEENT:  No gross abnormalities Pulmonary: Respirations are non-labored Abdomen: Soft and non-tender. Musculoskeletal: There are no major deformities.   Neurologic: No focal weakness or paresthesias are detected, Skin: There are no ulcer or rashes noted. Two shallow non granulated areas at left forearm AVF.  Psychiatric: The patient has normal affect. Cardiovascular: There is a regular rate and rhythm without significant murmur appreciated. Palpable left AVF forearm thrill, 2+ palpable left radial pulse.  Medical Decision Making  [name] is a 43 y.o. male who is s/p arteriovenous left upper arm (AV) fistula creation on 05/03/13 and S/P left upper arm AVF Superfic. & Ligation of branches on 08-02-13. He presents today with  C/O Left AVF non-healing wound for 1-2 wks. He states that he scrubs his left forearm AVF hard daily in his shower which also happened in his right upper arm fistula Dr. Bridgett Larsson examined and spoke with pt, advised him not to scrub areas that do not have a chance to granulate, gently pat and wash daily with soap and water, apply Neosporin dressing.  Continue to access for HD at areas on his AVF that are not open. Follow up with Dr. Donnetta Hutching as needed.  [name], Sharmon Leyden, RN, MSN, FNP-C [facility] [place] [facility] Office: [phone]  07/10/2014, 2:58 PM  Clinic MD: Bridgett Larsson

## 2014-07-12 DIAGNOSIS — N2581 Secondary hyperparathyroidism of renal origin: Secondary | ICD-10-CM | POA: Diagnosis not present

## 2014-07-12 DIAGNOSIS — N186 End stage renal disease: Secondary | ICD-10-CM | POA: Diagnosis not present

## 2014-07-12 DIAGNOSIS — D509 Iron deficiency anemia, unspecified: Secondary | ICD-10-CM | POA: Diagnosis not present

## 2014-07-15 DIAGNOSIS — N2581 Secondary hyperparathyroidism of renal origin: Secondary | ICD-10-CM | POA: Diagnosis not present

## 2014-07-15 DIAGNOSIS — D509 Iron deficiency anemia, unspecified: Secondary | ICD-10-CM | POA: Diagnosis not present

## 2014-07-15 DIAGNOSIS — N186 End stage renal disease: Secondary | ICD-10-CM | POA: Diagnosis not present

## 2014-07-17 DIAGNOSIS — N2581 Secondary hyperparathyroidism of renal origin: Secondary | ICD-10-CM | POA: Diagnosis not present

## 2014-07-17 DIAGNOSIS — D509 Iron deficiency anemia, unspecified: Secondary | ICD-10-CM | POA: Diagnosis not present

## 2014-07-17 DIAGNOSIS — N186 End stage renal disease: Secondary | ICD-10-CM | POA: Diagnosis not present

## 2014-07-19 DIAGNOSIS — D509 Iron deficiency anemia, unspecified: Secondary | ICD-10-CM | POA: Diagnosis not present

## 2014-07-19 DIAGNOSIS — N2581 Secondary hyperparathyroidism of renal origin: Secondary | ICD-10-CM | POA: Diagnosis not present

## 2014-07-19 DIAGNOSIS — N186 End stage renal disease: Secondary | ICD-10-CM | POA: Diagnosis not present

## 2014-07-21 DIAGNOSIS — N2581 Secondary hyperparathyroidism of renal origin: Secondary | ICD-10-CM | POA: Diagnosis not present

## 2014-07-21 DIAGNOSIS — N186 End stage renal disease: Secondary | ICD-10-CM | POA: Diagnosis not present

## 2014-07-21 DIAGNOSIS — D509 Iron deficiency anemia, unspecified: Secondary | ICD-10-CM | POA: Diagnosis not present

## 2014-07-23 DIAGNOSIS — D509 Iron deficiency anemia, unspecified: Secondary | ICD-10-CM | POA: Diagnosis not present

## 2014-07-23 DIAGNOSIS — N2581 Secondary hyperparathyroidism of renal origin: Secondary | ICD-10-CM | POA: Diagnosis not present

## 2014-07-23 DIAGNOSIS — N186 End stage renal disease: Secondary | ICD-10-CM | POA: Diagnosis not present

## 2014-07-25 DIAGNOSIS — D509 Iron deficiency anemia, unspecified: Secondary | ICD-10-CM | POA: Diagnosis not present

## 2014-07-25 DIAGNOSIS — N186 End stage renal disease: Secondary | ICD-10-CM | POA: Diagnosis not present

## 2014-07-25 DIAGNOSIS — N2581 Secondary hyperparathyroidism of renal origin: Secondary | ICD-10-CM | POA: Diagnosis not present

## 2014-07-25 DIAGNOSIS — Z23 Encounter for immunization: Secondary | ICD-10-CM | POA: Diagnosis not present

## 2014-08-23 DIAGNOSIS — N186 End stage renal disease: Secondary | ICD-10-CM | POA: Diagnosis not present

## 2014-08-23 DIAGNOSIS — Z992 Dependence on renal dialysis: Secondary | ICD-10-CM | POA: Diagnosis not present

## 2014-08-25 DIAGNOSIS — N186 End stage renal disease: Secondary | ICD-10-CM | POA: Diagnosis not present

## 2014-08-25 DIAGNOSIS — N2581 Secondary hyperparathyroidism of renal origin: Secondary | ICD-10-CM | POA: Diagnosis not present

## 2014-08-25 DIAGNOSIS — E8779 Other fluid overload: Secondary | ICD-10-CM | POA: Diagnosis not present

## 2014-08-27 DIAGNOSIS — N186 End stage renal disease: Secondary | ICD-10-CM | POA: Diagnosis not present

## 2014-08-27 DIAGNOSIS — N2581 Secondary hyperparathyroidism of renal origin: Secondary | ICD-10-CM | POA: Diagnosis not present

## 2014-08-27 DIAGNOSIS — E8779 Other fluid overload: Secondary | ICD-10-CM | POA: Diagnosis not present

## 2014-08-29 DIAGNOSIS — E8779 Other fluid overload: Secondary | ICD-10-CM | POA: Diagnosis not present

## 2014-08-29 DIAGNOSIS — N2581 Secondary hyperparathyroidism of renal origin: Secondary | ICD-10-CM | POA: Diagnosis not present

## 2014-08-29 DIAGNOSIS — N186 End stage renal disease: Secondary | ICD-10-CM | POA: Diagnosis not present

## 2014-09-01 DIAGNOSIS — N186 End stage renal disease: Secondary | ICD-10-CM | POA: Diagnosis not present

## 2014-09-01 DIAGNOSIS — N2581 Secondary hyperparathyroidism of renal origin: Secondary | ICD-10-CM | POA: Diagnosis not present

## 2014-09-01 DIAGNOSIS — E8779 Other fluid overload: Secondary | ICD-10-CM | POA: Diagnosis not present

## 2014-09-03 DIAGNOSIS — N186 End stage renal disease: Secondary | ICD-10-CM | POA: Diagnosis not present

## 2014-09-03 DIAGNOSIS — E8779 Other fluid overload: Secondary | ICD-10-CM | POA: Diagnosis not present

## 2014-09-03 DIAGNOSIS — N2581 Secondary hyperparathyroidism of renal origin: Secondary | ICD-10-CM | POA: Diagnosis not present

## 2014-09-05 DIAGNOSIS — N186 End stage renal disease: Secondary | ICD-10-CM | POA: Diagnosis not present

## 2014-09-05 DIAGNOSIS — E8779 Other fluid overload: Secondary | ICD-10-CM | POA: Diagnosis not present

## 2014-09-05 DIAGNOSIS — N2581 Secondary hyperparathyroidism of renal origin: Secondary | ICD-10-CM | POA: Diagnosis not present

## 2014-09-08 DIAGNOSIS — E8779 Other fluid overload: Secondary | ICD-10-CM | POA: Diagnosis not present

## 2014-09-08 DIAGNOSIS — N186 End stage renal disease: Secondary | ICD-10-CM | POA: Diagnosis not present

## 2014-09-08 DIAGNOSIS — N2581 Secondary hyperparathyroidism of renal origin: Secondary | ICD-10-CM | POA: Diagnosis not present

## 2014-09-10 DIAGNOSIS — N186 End stage renal disease: Secondary | ICD-10-CM | POA: Diagnosis not present

## 2014-09-10 DIAGNOSIS — E8779 Other fluid overload: Secondary | ICD-10-CM | POA: Diagnosis not present

## 2014-09-10 DIAGNOSIS — N2581 Secondary hyperparathyroidism of renal origin: Secondary | ICD-10-CM | POA: Diagnosis not present

## 2014-09-12 DIAGNOSIS — N2581 Secondary hyperparathyroidism of renal origin: Secondary | ICD-10-CM | POA: Diagnosis not present

## 2014-09-12 DIAGNOSIS — E8779 Other fluid overload: Secondary | ICD-10-CM | POA: Diagnosis not present

## 2014-09-12 DIAGNOSIS — N186 End stage renal disease: Secondary | ICD-10-CM | POA: Diagnosis not present

## 2014-09-15 DIAGNOSIS — N2581 Secondary hyperparathyroidism of renal origin: Secondary | ICD-10-CM | POA: Diagnosis not present

## 2014-09-15 DIAGNOSIS — N186 End stage renal disease: Secondary | ICD-10-CM | POA: Diagnosis not present

## 2014-09-15 DIAGNOSIS — E8779 Other fluid overload: Secondary | ICD-10-CM | POA: Diagnosis not present

## 2014-09-16 DIAGNOSIS — E8779 Other fluid overload: Secondary | ICD-10-CM | POA: Diagnosis not present

## 2014-09-16 DIAGNOSIS — N2581 Secondary hyperparathyroidism of renal origin: Secondary | ICD-10-CM | POA: Diagnosis not present

## 2014-09-16 DIAGNOSIS — N186 End stage renal disease: Secondary | ICD-10-CM | POA: Diagnosis not present

## 2014-09-17 DIAGNOSIS — N186 End stage renal disease: Secondary | ICD-10-CM | POA: Diagnosis not present

## 2014-09-17 DIAGNOSIS — N2581 Secondary hyperparathyroidism of renal origin: Secondary | ICD-10-CM | POA: Diagnosis not present

## 2014-09-17 DIAGNOSIS — E8779 Other fluid overload: Secondary | ICD-10-CM | POA: Diagnosis not present

## 2014-09-19 DIAGNOSIS — N2581 Secondary hyperparathyroidism of renal origin: Secondary | ICD-10-CM | POA: Diagnosis not present

## 2014-09-19 DIAGNOSIS — E8779 Other fluid overload: Secondary | ICD-10-CM | POA: Diagnosis not present

## 2014-09-19 DIAGNOSIS — N186 End stage renal disease: Secondary | ICD-10-CM | POA: Diagnosis not present

## 2014-09-22 DIAGNOSIS — Z992 Dependence on renal dialysis: Secondary | ICD-10-CM | POA: Diagnosis not present

## 2014-09-22 DIAGNOSIS — N2581 Secondary hyperparathyroidism of renal origin: Secondary | ICD-10-CM | POA: Diagnosis not present

## 2014-09-22 DIAGNOSIS — N186 End stage renal disease: Secondary | ICD-10-CM | POA: Diagnosis not present

## 2014-09-22 DIAGNOSIS — E8779 Other fluid overload: Secondary | ICD-10-CM | POA: Diagnosis not present

## 2014-09-24 DIAGNOSIS — N2581 Secondary hyperparathyroidism of renal origin: Secondary | ICD-10-CM | POA: Diagnosis not present

## 2014-09-24 DIAGNOSIS — N186 End stage renal disease: Secondary | ICD-10-CM | POA: Diagnosis not present

## 2014-09-26 DIAGNOSIS — N2581 Secondary hyperparathyroidism of renal origin: Secondary | ICD-10-CM | POA: Diagnosis not present

## 2014-09-26 DIAGNOSIS — N186 End stage renal disease: Secondary | ICD-10-CM | POA: Diagnosis not present

## 2014-09-29 DIAGNOSIS — N186 End stage renal disease: Secondary | ICD-10-CM | POA: Diagnosis not present

## 2014-09-29 DIAGNOSIS — N2581 Secondary hyperparathyroidism of renal origin: Secondary | ICD-10-CM | POA: Diagnosis not present

## 2014-10-01 DIAGNOSIS — N2581 Secondary hyperparathyroidism of renal origin: Secondary | ICD-10-CM | POA: Diagnosis not present

## 2014-10-01 DIAGNOSIS — N186 End stage renal disease: Secondary | ICD-10-CM | POA: Diagnosis not present

## 2014-10-03 DIAGNOSIS — N186 End stage renal disease: Secondary | ICD-10-CM | POA: Diagnosis not present

## 2014-10-03 DIAGNOSIS — N2581 Secondary hyperparathyroidism of renal origin: Secondary | ICD-10-CM | POA: Diagnosis not present

## 2014-10-06 DIAGNOSIS — N186 End stage renal disease: Secondary | ICD-10-CM | POA: Diagnosis not present

## 2014-10-06 DIAGNOSIS — N2581 Secondary hyperparathyroidism of renal origin: Secondary | ICD-10-CM | POA: Diagnosis not present

## 2014-10-08 DIAGNOSIS — N2581 Secondary hyperparathyroidism of renal origin: Secondary | ICD-10-CM | POA: Diagnosis not present

## 2014-10-08 DIAGNOSIS — N186 End stage renal disease: Secondary | ICD-10-CM | POA: Diagnosis not present

## 2014-10-10 DIAGNOSIS — N2581 Secondary hyperparathyroidism of renal origin: Secondary | ICD-10-CM | POA: Diagnosis not present

## 2014-10-10 DIAGNOSIS — N186 End stage renal disease: Secondary | ICD-10-CM | POA: Diagnosis not present

## 2014-10-13 DIAGNOSIS — N186 End stage renal disease: Secondary | ICD-10-CM | POA: Diagnosis not present

## 2014-10-13 DIAGNOSIS — N2581 Secondary hyperparathyroidism of renal origin: Secondary | ICD-10-CM | POA: Diagnosis not present

## 2014-10-15 DIAGNOSIS — N186 End stage renal disease: Secondary | ICD-10-CM | POA: Diagnosis not present

## 2014-10-15 DIAGNOSIS — N2581 Secondary hyperparathyroidism of renal origin: Secondary | ICD-10-CM | POA: Diagnosis not present

## 2014-10-18 DIAGNOSIS — N186 End stage renal disease: Secondary | ICD-10-CM | POA: Diagnosis not present

## 2014-10-18 DIAGNOSIS — N2581 Secondary hyperparathyroidism of renal origin: Secondary | ICD-10-CM | POA: Diagnosis not present

## 2014-10-20 DIAGNOSIS — N2581 Secondary hyperparathyroidism of renal origin: Secondary | ICD-10-CM | POA: Diagnosis not present

## 2014-10-20 DIAGNOSIS — N186 End stage renal disease: Secondary | ICD-10-CM | POA: Diagnosis not present

## 2014-10-22 DIAGNOSIS — N2581 Secondary hyperparathyroidism of renal origin: Secondary | ICD-10-CM | POA: Diagnosis not present

## 2014-10-22 DIAGNOSIS — N186 End stage renal disease: Secondary | ICD-10-CM | POA: Diagnosis not present

## 2014-10-23 DIAGNOSIS — Z992 Dependence on renal dialysis: Secondary | ICD-10-CM | POA: Diagnosis not present

## 2014-10-23 DIAGNOSIS — N186 End stage renal disease: Secondary | ICD-10-CM | POA: Diagnosis not present

## 2014-10-24 DIAGNOSIS — N186 End stage renal disease: Secondary | ICD-10-CM | POA: Diagnosis not present

## 2014-10-27 DIAGNOSIS — N186 End stage renal disease: Secondary | ICD-10-CM | POA: Diagnosis not present

## 2014-10-29 DIAGNOSIS — N186 End stage renal disease: Secondary | ICD-10-CM | POA: Diagnosis not present

## 2014-10-31 DIAGNOSIS — N186 End stage renal disease: Secondary | ICD-10-CM | POA: Diagnosis not present

## 2014-11-03 DIAGNOSIS — N186 End stage renal disease: Secondary | ICD-10-CM | POA: Diagnosis not present

## 2014-11-05 DIAGNOSIS — N186 End stage renal disease: Secondary | ICD-10-CM | POA: Diagnosis not present

## 2014-11-07 DIAGNOSIS — N186 End stage renal disease: Secondary | ICD-10-CM | POA: Diagnosis not present

## 2014-11-10 DIAGNOSIS — N186 End stage renal disease: Secondary | ICD-10-CM | POA: Diagnosis not present

## 2014-11-13 DIAGNOSIS — N186 End stage renal disease: Secondary | ICD-10-CM | POA: Diagnosis not present

## 2014-11-14 DIAGNOSIS — N186 End stage renal disease: Secondary | ICD-10-CM | POA: Diagnosis not present

## 2014-11-17 DIAGNOSIS — N186 End stage renal disease: Secondary | ICD-10-CM | POA: Diagnosis not present

## 2014-11-19 DIAGNOSIS — N186 End stage renal disease: Secondary | ICD-10-CM | POA: Diagnosis not present

## 2014-11-21 DIAGNOSIS — N186 End stage renal disease: Secondary | ICD-10-CM | POA: Diagnosis not present

## 2014-11-23 DIAGNOSIS — Z992 Dependence on renal dialysis: Secondary | ICD-10-CM | POA: Diagnosis not present

## 2014-11-23 DIAGNOSIS — N186 End stage renal disease: Secondary | ICD-10-CM | POA: Diagnosis not present

## 2014-11-24 DIAGNOSIS — N2581 Secondary hyperparathyroidism of renal origin: Secondary | ICD-10-CM | POA: Diagnosis not present

## 2014-11-24 DIAGNOSIS — N186 End stage renal disease: Secondary | ICD-10-CM | POA: Diagnosis not present

## 2014-11-26 DIAGNOSIS — N186 End stage renal disease: Secondary | ICD-10-CM | POA: Diagnosis not present

## 2014-11-26 DIAGNOSIS — N2581 Secondary hyperparathyroidism of renal origin: Secondary | ICD-10-CM | POA: Diagnosis not present

## 2014-11-29 DIAGNOSIS — N186 End stage renal disease: Secondary | ICD-10-CM | POA: Diagnosis not present

## 2014-11-29 DIAGNOSIS — N2581 Secondary hyperparathyroidism of renal origin: Secondary | ICD-10-CM | POA: Diagnosis not present

## 2014-12-01 DIAGNOSIS — N186 End stage renal disease: Secondary | ICD-10-CM | POA: Diagnosis not present

## 2014-12-01 DIAGNOSIS — N2581 Secondary hyperparathyroidism of renal origin: Secondary | ICD-10-CM | POA: Diagnosis not present

## 2014-12-03 DIAGNOSIS — N186 End stage renal disease: Secondary | ICD-10-CM | POA: Diagnosis not present

## 2014-12-03 DIAGNOSIS — N2581 Secondary hyperparathyroidism of renal origin: Secondary | ICD-10-CM | POA: Diagnosis not present

## 2014-12-05 DIAGNOSIS — N2581 Secondary hyperparathyroidism of renal origin: Secondary | ICD-10-CM | POA: Diagnosis not present

## 2014-12-05 DIAGNOSIS — N186 End stage renal disease: Secondary | ICD-10-CM | POA: Diagnosis not present

## 2014-12-08 DIAGNOSIS — N186 End stage renal disease: Secondary | ICD-10-CM | POA: Diagnosis not present

## 2014-12-08 DIAGNOSIS — N2581 Secondary hyperparathyroidism of renal origin: Secondary | ICD-10-CM | POA: Diagnosis not present

## 2014-12-10 DIAGNOSIS — N2581 Secondary hyperparathyroidism of renal origin: Secondary | ICD-10-CM | POA: Diagnosis not present

## 2014-12-10 DIAGNOSIS — N186 End stage renal disease: Secondary | ICD-10-CM | POA: Diagnosis not present

## 2014-12-12 DIAGNOSIS — N186 End stage renal disease: Secondary | ICD-10-CM | POA: Diagnosis not present

## 2014-12-12 DIAGNOSIS — N2581 Secondary hyperparathyroidism of renal origin: Secondary | ICD-10-CM | POA: Diagnosis not present

## 2014-12-15 DIAGNOSIS — N2581 Secondary hyperparathyroidism of renal origin: Secondary | ICD-10-CM | POA: Diagnosis not present

## 2014-12-15 DIAGNOSIS — N186 End stage renal disease: Secondary | ICD-10-CM | POA: Diagnosis not present

## 2014-12-17 DIAGNOSIS — N186 End stage renal disease: Secondary | ICD-10-CM | POA: Diagnosis not present

## 2014-12-17 DIAGNOSIS — N2581 Secondary hyperparathyroidism of renal origin: Secondary | ICD-10-CM | POA: Diagnosis not present

## 2014-12-19 DIAGNOSIS — N186 End stage renal disease: Secondary | ICD-10-CM | POA: Diagnosis not present

## 2014-12-19 DIAGNOSIS — N2581 Secondary hyperparathyroidism of renal origin: Secondary | ICD-10-CM | POA: Diagnosis not present

## 2014-12-22 DIAGNOSIS — Z992 Dependence on renal dialysis: Secondary | ICD-10-CM | POA: Diagnosis not present

## 2014-12-22 DIAGNOSIS — N186 End stage renal disease: Secondary | ICD-10-CM | POA: Diagnosis not present

## 2014-12-22 DIAGNOSIS — N2581 Secondary hyperparathyroidism of renal origin: Secondary | ICD-10-CM | POA: Diagnosis not present

## 2014-12-24 DIAGNOSIS — N186 End stage renal disease: Secondary | ICD-10-CM | POA: Diagnosis not present

## 2014-12-24 DIAGNOSIS — N2581 Secondary hyperparathyroidism of renal origin: Secondary | ICD-10-CM | POA: Diagnosis not present

## 2014-12-26 DIAGNOSIS — N186 End stage renal disease: Secondary | ICD-10-CM | POA: Diagnosis not present

## 2014-12-26 DIAGNOSIS — N2581 Secondary hyperparathyroidism of renal origin: Secondary | ICD-10-CM | POA: Diagnosis not present

## 2014-12-29 DIAGNOSIS — N186 End stage renal disease: Secondary | ICD-10-CM | POA: Diagnosis not present

## 2014-12-29 DIAGNOSIS — N2581 Secondary hyperparathyroidism of renal origin: Secondary | ICD-10-CM | POA: Diagnosis not present

## 2014-12-31 DIAGNOSIS — N2581 Secondary hyperparathyroidism of renal origin: Secondary | ICD-10-CM | POA: Diagnosis not present

## 2014-12-31 DIAGNOSIS — N186 End stage renal disease: Secondary | ICD-10-CM | POA: Diagnosis not present

## 2015-01-02 DIAGNOSIS — N186 End stage renal disease: Secondary | ICD-10-CM | POA: Diagnosis not present

## 2015-01-02 DIAGNOSIS — N2581 Secondary hyperparathyroidism of renal origin: Secondary | ICD-10-CM | POA: Diagnosis not present

## 2015-01-05 DIAGNOSIS — N2581 Secondary hyperparathyroidism of renal origin: Secondary | ICD-10-CM | POA: Diagnosis not present

## 2015-01-05 DIAGNOSIS — N186 End stage renal disease: Secondary | ICD-10-CM | POA: Diagnosis not present

## 2015-01-07 DIAGNOSIS — N2581 Secondary hyperparathyroidism of renal origin: Secondary | ICD-10-CM | POA: Diagnosis not present

## 2015-01-07 DIAGNOSIS — N186 End stage renal disease: Secondary | ICD-10-CM | POA: Diagnosis not present

## 2015-01-09 DIAGNOSIS — N2581 Secondary hyperparathyroidism of renal origin: Secondary | ICD-10-CM | POA: Diagnosis not present

## 2015-01-09 DIAGNOSIS — N186 End stage renal disease: Secondary | ICD-10-CM | POA: Diagnosis not present

## 2015-01-12 DIAGNOSIS — N186 End stage renal disease: Secondary | ICD-10-CM | POA: Diagnosis not present

## 2015-01-12 DIAGNOSIS — N2581 Secondary hyperparathyroidism of renal origin: Secondary | ICD-10-CM | POA: Diagnosis not present

## 2015-01-14 DIAGNOSIS — N186 End stage renal disease: Secondary | ICD-10-CM | POA: Diagnosis not present

## 2015-01-14 DIAGNOSIS — N2581 Secondary hyperparathyroidism of renal origin: Secondary | ICD-10-CM | POA: Diagnosis not present

## 2015-01-16 DIAGNOSIS — N2581 Secondary hyperparathyroidism of renal origin: Secondary | ICD-10-CM | POA: Diagnosis not present

## 2015-01-16 DIAGNOSIS — N186 End stage renal disease: Secondary | ICD-10-CM | POA: Diagnosis not present

## 2015-01-19 DIAGNOSIS — N2581 Secondary hyperparathyroidism of renal origin: Secondary | ICD-10-CM | POA: Diagnosis not present

## 2015-01-19 DIAGNOSIS — N186 End stage renal disease: Secondary | ICD-10-CM | POA: Diagnosis not present

## 2015-01-21 DIAGNOSIS — N2581 Secondary hyperparathyroidism of renal origin: Secondary | ICD-10-CM | POA: Diagnosis not present

## 2015-01-21 DIAGNOSIS — N186 End stage renal disease: Secondary | ICD-10-CM | POA: Diagnosis not present

## 2015-01-22 DIAGNOSIS — Z992 Dependence on renal dialysis: Secondary | ICD-10-CM | POA: Diagnosis not present

## 2015-01-22 DIAGNOSIS — I120 Hypertensive chronic kidney disease with stage 5 chronic kidney disease or end stage renal disease: Secondary | ICD-10-CM | POA: Diagnosis not present

## 2015-01-22 DIAGNOSIS — N186 End stage renal disease: Secondary | ICD-10-CM | POA: Diagnosis not present

## 2015-01-23 DIAGNOSIS — Z23 Encounter for immunization: Secondary | ICD-10-CM | POA: Diagnosis not present

## 2015-01-23 DIAGNOSIS — N186 End stage renal disease: Secondary | ICD-10-CM | POA: Diagnosis not present

## 2015-01-23 DIAGNOSIS — N2581 Secondary hyperparathyroidism of renal origin: Secondary | ICD-10-CM | POA: Diagnosis not present

## 2015-01-26 DIAGNOSIS — N186 End stage renal disease: Secondary | ICD-10-CM | POA: Diagnosis not present

## 2015-01-26 DIAGNOSIS — Z23 Encounter for immunization: Secondary | ICD-10-CM | POA: Diagnosis not present

## 2015-01-26 DIAGNOSIS — N2581 Secondary hyperparathyroidism of renal origin: Secondary | ICD-10-CM | POA: Diagnosis not present

## 2015-01-28 DIAGNOSIS — N2581 Secondary hyperparathyroidism of renal origin: Secondary | ICD-10-CM | POA: Diagnosis not present

## 2015-01-28 DIAGNOSIS — Z23 Encounter for immunization: Secondary | ICD-10-CM | POA: Diagnosis not present

## 2015-01-28 DIAGNOSIS — N186 End stage renal disease: Secondary | ICD-10-CM | POA: Diagnosis not present

## 2015-01-30 DIAGNOSIS — Z23 Encounter for immunization: Secondary | ICD-10-CM | POA: Diagnosis not present

## 2015-01-30 DIAGNOSIS — N186 End stage renal disease: Secondary | ICD-10-CM | POA: Diagnosis not present

## 2015-01-30 DIAGNOSIS — N2581 Secondary hyperparathyroidism of renal origin: Secondary | ICD-10-CM | POA: Diagnosis not present

## 2015-02-02 DIAGNOSIS — N2581 Secondary hyperparathyroidism of renal origin: Secondary | ICD-10-CM | POA: Diagnosis not present

## 2015-02-02 DIAGNOSIS — Z23 Encounter for immunization: Secondary | ICD-10-CM | POA: Diagnosis not present

## 2015-02-02 DIAGNOSIS — N186 End stage renal disease: Secondary | ICD-10-CM | POA: Diagnosis not present

## 2015-02-04 DIAGNOSIS — N186 End stage renal disease: Secondary | ICD-10-CM | POA: Diagnosis not present

## 2015-02-04 DIAGNOSIS — N2581 Secondary hyperparathyroidism of renal origin: Secondary | ICD-10-CM | POA: Diagnosis not present

## 2015-02-04 DIAGNOSIS — Z23 Encounter for immunization: Secondary | ICD-10-CM | POA: Diagnosis not present

## 2015-02-06 DIAGNOSIS — Z23 Encounter for immunization: Secondary | ICD-10-CM | POA: Diagnosis not present

## 2015-02-06 DIAGNOSIS — N186 End stage renal disease: Secondary | ICD-10-CM | POA: Diagnosis not present

## 2015-02-06 DIAGNOSIS — N2581 Secondary hyperparathyroidism of renal origin: Secondary | ICD-10-CM | POA: Diagnosis not present

## 2015-02-09 DIAGNOSIS — Z23 Encounter for immunization: Secondary | ICD-10-CM | POA: Diagnosis not present

## 2015-02-09 DIAGNOSIS — N2581 Secondary hyperparathyroidism of renal origin: Secondary | ICD-10-CM | POA: Diagnosis not present

## 2015-02-09 DIAGNOSIS — N186 End stage renal disease: Secondary | ICD-10-CM | POA: Diagnosis not present

## 2015-02-11 DIAGNOSIS — N2581 Secondary hyperparathyroidism of renal origin: Secondary | ICD-10-CM | POA: Diagnosis not present

## 2015-02-11 DIAGNOSIS — Z23 Encounter for immunization: Secondary | ICD-10-CM | POA: Diagnosis not present

## 2015-02-11 DIAGNOSIS — N186 End stage renal disease: Secondary | ICD-10-CM | POA: Diagnosis not present

## 2015-02-13 DIAGNOSIS — N2581 Secondary hyperparathyroidism of renal origin: Secondary | ICD-10-CM | POA: Diagnosis not present

## 2015-02-13 DIAGNOSIS — N186 End stage renal disease: Secondary | ICD-10-CM | POA: Diagnosis not present

## 2015-02-13 DIAGNOSIS — Z23 Encounter for immunization: Secondary | ICD-10-CM | POA: Diagnosis not present

## 2015-02-16 DIAGNOSIS — Z23 Encounter for immunization: Secondary | ICD-10-CM | POA: Diagnosis not present

## 2015-02-16 DIAGNOSIS — N2581 Secondary hyperparathyroidism of renal origin: Secondary | ICD-10-CM | POA: Diagnosis not present

## 2015-02-16 DIAGNOSIS — N186 End stage renal disease: Secondary | ICD-10-CM | POA: Diagnosis not present

## 2015-02-18 DIAGNOSIS — N186 End stage renal disease: Secondary | ICD-10-CM | POA: Diagnosis not present

## 2015-02-18 DIAGNOSIS — N2581 Secondary hyperparathyroidism of renal origin: Secondary | ICD-10-CM | POA: Diagnosis not present

## 2015-02-18 DIAGNOSIS — Z23 Encounter for immunization: Secondary | ICD-10-CM | POA: Diagnosis not present

## 2015-02-20 DIAGNOSIS — H25013 Cortical age-related cataract, bilateral: Secondary | ICD-10-CM | POA: Diagnosis not present

## 2015-02-20 DIAGNOSIS — N186 End stage renal disease: Secondary | ICD-10-CM | POA: Diagnosis not present

## 2015-02-20 DIAGNOSIS — N2581 Secondary hyperparathyroidism of renal origin: Secondary | ICD-10-CM | POA: Diagnosis not present

## 2015-02-20 DIAGNOSIS — Z23 Encounter for immunization: Secondary | ICD-10-CM | POA: Diagnosis not present

## 2015-02-21 DIAGNOSIS — Z992 Dependence on renal dialysis: Secondary | ICD-10-CM | POA: Diagnosis not present

## 2015-02-21 DIAGNOSIS — N186 End stage renal disease: Secondary | ICD-10-CM | POA: Diagnosis not present

## 2015-02-21 DIAGNOSIS — I120 Hypertensive chronic kidney disease with stage 5 chronic kidney disease or end stage renal disease: Secondary | ICD-10-CM | POA: Diagnosis not present

## 2015-02-23 DIAGNOSIS — N186 End stage renal disease: Secondary | ICD-10-CM | POA: Diagnosis not present

## 2015-02-23 DIAGNOSIS — N2581 Secondary hyperparathyroidism of renal origin: Secondary | ICD-10-CM | POA: Diagnosis not present

## 2015-02-25 DIAGNOSIS — N2581 Secondary hyperparathyroidism of renal origin: Secondary | ICD-10-CM | POA: Diagnosis not present

## 2015-02-25 DIAGNOSIS — N186 End stage renal disease: Secondary | ICD-10-CM | POA: Diagnosis not present

## 2015-02-27 DIAGNOSIS — N186 End stage renal disease: Secondary | ICD-10-CM | POA: Diagnosis not present

## 2015-02-27 DIAGNOSIS — N2581 Secondary hyperparathyroidism of renal origin: Secondary | ICD-10-CM | POA: Diagnosis not present

## 2015-03-02 DIAGNOSIS — N2581 Secondary hyperparathyroidism of renal origin: Secondary | ICD-10-CM | POA: Diagnosis not present

## 2015-03-02 DIAGNOSIS — N186 End stage renal disease: Secondary | ICD-10-CM | POA: Diagnosis not present

## 2015-03-04 DIAGNOSIS — N186 End stage renal disease: Secondary | ICD-10-CM | POA: Diagnosis not present

## 2015-03-04 DIAGNOSIS — N2581 Secondary hyperparathyroidism of renal origin: Secondary | ICD-10-CM | POA: Diagnosis not present

## 2015-03-06 DIAGNOSIS — N186 End stage renal disease: Secondary | ICD-10-CM | POA: Diagnosis not present

## 2015-03-06 DIAGNOSIS — N2581 Secondary hyperparathyroidism of renal origin: Secondary | ICD-10-CM | POA: Diagnosis not present

## 2015-03-09 DIAGNOSIS — N2581 Secondary hyperparathyroidism of renal origin: Secondary | ICD-10-CM | POA: Diagnosis not present

## 2015-03-09 DIAGNOSIS — N186 End stage renal disease: Secondary | ICD-10-CM | POA: Diagnosis not present

## 2015-03-11 DIAGNOSIS — N186 End stage renal disease: Secondary | ICD-10-CM | POA: Diagnosis not present

## 2015-03-11 DIAGNOSIS — N2581 Secondary hyperparathyroidism of renal origin: Secondary | ICD-10-CM | POA: Diagnosis not present

## 2015-03-13 DIAGNOSIS — N2581 Secondary hyperparathyroidism of renal origin: Secondary | ICD-10-CM | POA: Diagnosis not present

## 2015-03-13 DIAGNOSIS — N186 End stage renal disease: Secondary | ICD-10-CM | POA: Diagnosis not present

## 2015-03-16 DIAGNOSIS — N2581 Secondary hyperparathyroidism of renal origin: Secondary | ICD-10-CM | POA: Diagnosis not present

## 2015-03-16 DIAGNOSIS — N186 End stage renal disease: Secondary | ICD-10-CM | POA: Diagnosis not present

## 2015-03-18 DIAGNOSIS — N186 End stage renal disease: Secondary | ICD-10-CM | POA: Diagnosis not present

## 2015-03-18 DIAGNOSIS — N2581 Secondary hyperparathyroidism of renal origin: Secondary | ICD-10-CM | POA: Diagnosis not present

## 2015-03-20 DIAGNOSIS — N2581 Secondary hyperparathyroidism of renal origin: Secondary | ICD-10-CM | POA: Diagnosis not present

## 2015-03-20 DIAGNOSIS — N186 End stage renal disease: Secondary | ICD-10-CM | POA: Diagnosis not present

## 2015-03-23 DIAGNOSIS — N186 End stage renal disease: Secondary | ICD-10-CM | POA: Diagnosis not present

## 2015-03-23 DIAGNOSIS — N2581 Secondary hyperparathyroidism of renal origin: Secondary | ICD-10-CM | POA: Diagnosis not present

## 2015-03-24 DIAGNOSIS — I120 Hypertensive chronic kidney disease with stage 5 chronic kidney disease or end stage renal disease: Secondary | ICD-10-CM | POA: Diagnosis not present

## 2015-03-24 DIAGNOSIS — Z992 Dependence on renal dialysis: Secondary | ICD-10-CM | POA: Diagnosis not present

## 2015-03-24 DIAGNOSIS — N186 End stage renal disease: Secondary | ICD-10-CM | POA: Diagnosis not present

## 2015-03-26 DIAGNOSIS — N2581 Secondary hyperparathyroidism of renal origin: Secondary | ICD-10-CM | POA: Diagnosis not present

## 2015-03-26 DIAGNOSIS — N186 End stage renal disease: Secondary | ICD-10-CM | POA: Diagnosis not present

## 2015-03-26 DIAGNOSIS — D509 Iron deficiency anemia, unspecified: Secondary | ICD-10-CM | POA: Diagnosis not present

## 2015-03-27 DIAGNOSIS — N186 End stage renal disease: Secondary | ICD-10-CM | POA: Diagnosis not present

## 2015-03-27 DIAGNOSIS — N2581 Secondary hyperparathyroidism of renal origin: Secondary | ICD-10-CM | POA: Diagnosis not present

## 2015-03-27 DIAGNOSIS — D509 Iron deficiency anemia, unspecified: Secondary | ICD-10-CM | POA: Diagnosis not present

## 2015-03-30 DIAGNOSIS — D509 Iron deficiency anemia, unspecified: Secondary | ICD-10-CM | POA: Diagnosis not present

## 2015-03-30 DIAGNOSIS — N186 End stage renal disease: Secondary | ICD-10-CM | POA: Diagnosis not present

## 2015-03-30 DIAGNOSIS — N2581 Secondary hyperparathyroidism of renal origin: Secondary | ICD-10-CM | POA: Diagnosis not present

## 2015-04-01 DIAGNOSIS — N2581 Secondary hyperparathyroidism of renal origin: Secondary | ICD-10-CM | POA: Diagnosis not present

## 2015-04-01 DIAGNOSIS — N186 End stage renal disease: Secondary | ICD-10-CM | POA: Diagnosis not present

## 2015-04-01 DIAGNOSIS — D509 Iron deficiency anemia, unspecified: Secondary | ICD-10-CM | POA: Diagnosis not present

## 2015-04-03 DIAGNOSIS — N186 End stage renal disease: Secondary | ICD-10-CM | POA: Diagnosis not present

## 2015-04-03 DIAGNOSIS — N2581 Secondary hyperparathyroidism of renal origin: Secondary | ICD-10-CM | POA: Diagnosis not present

## 2015-04-03 DIAGNOSIS — D509 Iron deficiency anemia, unspecified: Secondary | ICD-10-CM | POA: Diagnosis not present

## 2015-04-06 DIAGNOSIS — N2581 Secondary hyperparathyroidism of renal origin: Secondary | ICD-10-CM | POA: Diagnosis not present

## 2015-04-06 DIAGNOSIS — D509 Iron deficiency anemia, unspecified: Secondary | ICD-10-CM | POA: Diagnosis not present

## 2015-04-06 DIAGNOSIS — N186 End stage renal disease: Secondary | ICD-10-CM | POA: Diagnosis not present

## 2015-04-08 DIAGNOSIS — N2581 Secondary hyperparathyroidism of renal origin: Secondary | ICD-10-CM | POA: Diagnosis not present

## 2015-04-08 DIAGNOSIS — N186 End stage renal disease: Secondary | ICD-10-CM | POA: Diagnosis not present

## 2015-04-08 DIAGNOSIS — D509 Iron deficiency anemia, unspecified: Secondary | ICD-10-CM | POA: Diagnosis not present

## 2015-04-10 DIAGNOSIS — N2581 Secondary hyperparathyroidism of renal origin: Secondary | ICD-10-CM | POA: Diagnosis not present

## 2015-04-10 DIAGNOSIS — N186 End stage renal disease: Secondary | ICD-10-CM | POA: Diagnosis not present

## 2015-04-10 DIAGNOSIS — D509 Iron deficiency anemia, unspecified: Secondary | ICD-10-CM | POA: Diagnosis not present

## 2015-04-13 DIAGNOSIS — N2581 Secondary hyperparathyroidism of renal origin: Secondary | ICD-10-CM | POA: Diagnosis not present

## 2015-04-13 DIAGNOSIS — D509 Iron deficiency anemia, unspecified: Secondary | ICD-10-CM | POA: Diagnosis not present

## 2015-04-13 DIAGNOSIS — N186 End stage renal disease: Secondary | ICD-10-CM | POA: Diagnosis not present

## 2015-04-15 DIAGNOSIS — N2581 Secondary hyperparathyroidism of renal origin: Secondary | ICD-10-CM | POA: Diagnosis not present

## 2015-04-15 DIAGNOSIS — D509 Iron deficiency anemia, unspecified: Secondary | ICD-10-CM | POA: Diagnosis not present

## 2015-04-15 DIAGNOSIS — N186 End stage renal disease: Secondary | ICD-10-CM | POA: Diagnosis not present

## 2015-04-17 DIAGNOSIS — D509 Iron deficiency anemia, unspecified: Secondary | ICD-10-CM | POA: Diagnosis not present

## 2015-04-17 DIAGNOSIS — N2581 Secondary hyperparathyroidism of renal origin: Secondary | ICD-10-CM | POA: Diagnosis not present

## 2015-04-17 DIAGNOSIS — N186 End stage renal disease: Secondary | ICD-10-CM | POA: Diagnosis not present

## 2015-04-20 DIAGNOSIS — D509 Iron deficiency anemia, unspecified: Secondary | ICD-10-CM | POA: Diagnosis not present

## 2015-04-20 DIAGNOSIS — N2581 Secondary hyperparathyroidism of renal origin: Secondary | ICD-10-CM | POA: Diagnosis not present

## 2015-04-20 DIAGNOSIS — N186 End stage renal disease: Secondary | ICD-10-CM | POA: Diagnosis not present

## 2015-04-22 DIAGNOSIS — N186 End stage renal disease: Secondary | ICD-10-CM | POA: Diagnosis not present

## 2015-04-22 DIAGNOSIS — D509 Iron deficiency anemia, unspecified: Secondary | ICD-10-CM | POA: Diagnosis not present

## 2015-04-22 DIAGNOSIS — N2581 Secondary hyperparathyroidism of renal origin: Secondary | ICD-10-CM | POA: Diagnosis not present

## 2015-04-23 DIAGNOSIS — I120 Hypertensive chronic kidney disease with stage 5 chronic kidney disease or end stage renal disease: Secondary | ICD-10-CM | POA: Diagnosis not present

## 2015-04-23 DIAGNOSIS — Z992 Dependence on renal dialysis: Secondary | ICD-10-CM | POA: Diagnosis not present

## 2015-04-23 DIAGNOSIS — N186 End stage renal disease: Secondary | ICD-10-CM | POA: Diagnosis not present

## 2015-04-24 DIAGNOSIS — N186 End stage renal disease: Secondary | ICD-10-CM | POA: Diagnosis not present

## 2015-04-24 DIAGNOSIS — N2581 Secondary hyperparathyroidism of renal origin: Secondary | ICD-10-CM | POA: Diagnosis not present

## 2015-04-27 DIAGNOSIS — N186 End stage renal disease: Secondary | ICD-10-CM | POA: Diagnosis not present

## 2015-04-27 DIAGNOSIS — N2581 Secondary hyperparathyroidism of renal origin: Secondary | ICD-10-CM | POA: Diagnosis not present

## 2015-04-29 DIAGNOSIS — N186 End stage renal disease: Secondary | ICD-10-CM | POA: Diagnosis not present

## 2015-04-29 DIAGNOSIS — N2581 Secondary hyperparathyroidism of renal origin: Secondary | ICD-10-CM | POA: Diagnosis not present

## 2015-05-01 DIAGNOSIS — N2581 Secondary hyperparathyroidism of renal origin: Secondary | ICD-10-CM | POA: Diagnosis not present

## 2015-05-01 DIAGNOSIS — N186 End stage renal disease: Secondary | ICD-10-CM | POA: Diagnosis not present

## 2015-05-04 DIAGNOSIS — N2581 Secondary hyperparathyroidism of renal origin: Secondary | ICD-10-CM | POA: Diagnosis not present

## 2015-05-04 DIAGNOSIS — N186 End stage renal disease: Secondary | ICD-10-CM | POA: Diagnosis not present

## 2015-05-06 DIAGNOSIS — N186 End stage renal disease: Secondary | ICD-10-CM | POA: Diagnosis not present

## 2015-05-06 DIAGNOSIS — N2581 Secondary hyperparathyroidism of renal origin: Secondary | ICD-10-CM | POA: Diagnosis not present

## 2015-05-08 DIAGNOSIS — N2581 Secondary hyperparathyroidism of renal origin: Secondary | ICD-10-CM | POA: Diagnosis not present

## 2015-05-08 DIAGNOSIS — N186 End stage renal disease: Secondary | ICD-10-CM | POA: Diagnosis not present

## 2015-05-11 DIAGNOSIS — N186 End stage renal disease: Secondary | ICD-10-CM | POA: Diagnosis not present

## 2015-05-11 DIAGNOSIS — N2581 Secondary hyperparathyroidism of renal origin: Secondary | ICD-10-CM | POA: Diagnosis not present

## 2015-05-13 DIAGNOSIS — N186 End stage renal disease: Secondary | ICD-10-CM | POA: Diagnosis not present

## 2015-05-13 DIAGNOSIS — N2581 Secondary hyperparathyroidism of renal origin: Secondary | ICD-10-CM | POA: Diagnosis not present

## 2015-05-15 DIAGNOSIS — N2581 Secondary hyperparathyroidism of renal origin: Secondary | ICD-10-CM | POA: Diagnosis not present

## 2015-05-15 DIAGNOSIS — N186 End stage renal disease: Secondary | ICD-10-CM | POA: Diagnosis not present

## 2015-05-18 DIAGNOSIS — N186 End stage renal disease: Secondary | ICD-10-CM | POA: Diagnosis not present

## 2015-05-18 DIAGNOSIS — N2581 Secondary hyperparathyroidism of renal origin: Secondary | ICD-10-CM | POA: Diagnosis not present

## 2015-05-20 DIAGNOSIS — N186 End stage renal disease: Secondary | ICD-10-CM | POA: Diagnosis not present

## 2015-05-20 DIAGNOSIS — N2581 Secondary hyperparathyroidism of renal origin: Secondary | ICD-10-CM | POA: Diagnosis not present

## 2015-05-22 DIAGNOSIS — N186 End stage renal disease: Secondary | ICD-10-CM | POA: Diagnosis not present

## 2015-05-22 DIAGNOSIS — N2581 Secondary hyperparathyroidism of renal origin: Secondary | ICD-10-CM | POA: Diagnosis not present

## 2015-05-24 DIAGNOSIS — N186 End stage renal disease: Secondary | ICD-10-CM | POA: Diagnosis not present

## 2015-05-24 DIAGNOSIS — Z992 Dependence on renal dialysis: Secondary | ICD-10-CM | POA: Diagnosis not present

## 2015-05-24 DIAGNOSIS — I120 Hypertensive chronic kidney disease with stage 5 chronic kidney disease or end stage renal disease: Secondary | ICD-10-CM | POA: Diagnosis not present

## 2015-05-25 DIAGNOSIS — N186 End stage renal disease: Secondary | ICD-10-CM | POA: Diagnosis not present

## 2015-05-25 DIAGNOSIS — N2581 Secondary hyperparathyroidism of renal origin: Secondary | ICD-10-CM | POA: Diagnosis not present

## 2015-05-25 DIAGNOSIS — D509 Iron deficiency anemia, unspecified: Secondary | ICD-10-CM | POA: Diagnosis not present

## 2015-05-27 DIAGNOSIS — N186 End stage renal disease: Secondary | ICD-10-CM | POA: Diagnosis not present

## 2015-05-27 DIAGNOSIS — D509 Iron deficiency anemia, unspecified: Secondary | ICD-10-CM | POA: Diagnosis not present

## 2015-05-27 DIAGNOSIS — N2581 Secondary hyperparathyroidism of renal origin: Secondary | ICD-10-CM | POA: Diagnosis not present

## 2015-05-29 DIAGNOSIS — N186 End stage renal disease: Secondary | ICD-10-CM | POA: Diagnosis not present

## 2015-05-29 DIAGNOSIS — N2581 Secondary hyperparathyroidism of renal origin: Secondary | ICD-10-CM | POA: Diagnosis not present

## 2015-05-29 DIAGNOSIS — D509 Iron deficiency anemia, unspecified: Secondary | ICD-10-CM | POA: Diagnosis not present

## 2015-06-01 DIAGNOSIS — N2581 Secondary hyperparathyroidism of renal origin: Secondary | ICD-10-CM | POA: Diagnosis not present

## 2015-06-01 DIAGNOSIS — N186 End stage renal disease: Secondary | ICD-10-CM | POA: Diagnosis not present

## 2015-06-01 DIAGNOSIS — D509 Iron deficiency anemia, unspecified: Secondary | ICD-10-CM | POA: Diagnosis not present

## 2015-06-03 DIAGNOSIS — N186 End stage renal disease: Secondary | ICD-10-CM | POA: Diagnosis not present

## 2015-06-03 DIAGNOSIS — D509 Iron deficiency anemia, unspecified: Secondary | ICD-10-CM | POA: Diagnosis not present

## 2015-06-03 DIAGNOSIS — N2581 Secondary hyperparathyroidism of renal origin: Secondary | ICD-10-CM | POA: Diagnosis not present

## 2015-06-05 DIAGNOSIS — N2581 Secondary hyperparathyroidism of renal origin: Secondary | ICD-10-CM | POA: Diagnosis not present

## 2015-06-05 DIAGNOSIS — N186 End stage renal disease: Secondary | ICD-10-CM | POA: Diagnosis not present

## 2015-06-05 DIAGNOSIS — D509 Iron deficiency anemia, unspecified: Secondary | ICD-10-CM | POA: Diagnosis not present

## 2015-06-08 DIAGNOSIS — D509 Iron deficiency anemia, unspecified: Secondary | ICD-10-CM | POA: Diagnosis not present

## 2015-06-08 DIAGNOSIS — N186 End stage renal disease: Secondary | ICD-10-CM | POA: Diagnosis not present

## 2015-06-08 DIAGNOSIS — N2581 Secondary hyperparathyroidism of renal origin: Secondary | ICD-10-CM | POA: Diagnosis not present

## 2015-06-10 DIAGNOSIS — N186 End stage renal disease: Secondary | ICD-10-CM | POA: Diagnosis not present

## 2015-06-10 DIAGNOSIS — D509 Iron deficiency anemia, unspecified: Secondary | ICD-10-CM | POA: Diagnosis not present

## 2015-06-10 DIAGNOSIS — N2581 Secondary hyperparathyroidism of renal origin: Secondary | ICD-10-CM | POA: Diagnosis not present

## 2015-06-12 DIAGNOSIS — N186 End stage renal disease: Secondary | ICD-10-CM | POA: Diagnosis not present

## 2015-06-12 DIAGNOSIS — D509 Iron deficiency anemia, unspecified: Secondary | ICD-10-CM | POA: Diagnosis not present

## 2015-06-12 DIAGNOSIS — N2581 Secondary hyperparathyroidism of renal origin: Secondary | ICD-10-CM | POA: Diagnosis not present

## 2015-06-15 DIAGNOSIS — D509 Iron deficiency anemia, unspecified: Secondary | ICD-10-CM | POA: Diagnosis not present

## 2015-06-15 DIAGNOSIS — N186 End stage renal disease: Secondary | ICD-10-CM | POA: Diagnosis not present

## 2015-06-15 DIAGNOSIS — N2581 Secondary hyperparathyroidism of renal origin: Secondary | ICD-10-CM | POA: Diagnosis not present

## 2015-06-17 DIAGNOSIS — N2581 Secondary hyperparathyroidism of renal origin: Secondary | ICD-10-CM | POA: Diagnosis not present

## 2015-06-17 DIAGNOSIS — N186 End stage renal disease: Secondary | ICD-10-CM | POA: Diagnosis not present

## 2015-06-17 DIAGNOSIS — D509 Iron deficiency anemia, unspecified: Secondary | ICD-10-CM | POA: Diagnosis not present

## 2015-06-19 DIAGNOSIS — N2581 Secondary hyperparathyroidism of renal origin: Secondary | ICD-10-CM | POA: Diagnosis not present

## 2015-06-19 DIAGNOSIS — D509 Iron deficiency anemia, unspecified: Secondary | ICD-10-CM | POA: Diagnosis not present

## 2015-06-19 DIAGNOSIS — N186 End stage renal disease: Secondary | ICD-10-CM | POA: Diagnosis not present

## 2015-06-22 DIAGNOSIS — N2581 Secondary hyperparathyroidism of renal origin: Secondary | ICD-10-CM | POA: Diagnosis not present

## 2015-06-22 DIAGNOSIS — D509 Iron deficiency anemia, unspecified: Secondary | ICD-10-CM | POA: Diagnosis not present

## 2015-06-22 DIAGNOSIS — N186 End stage renal disease: Secondary | ICD-10-CM | POA: Diagnosis not present

## 2015-06-24 DIAGNOSIS — N186 End stage renal disease: Secondary | ICD-10-CM | POA: Diagnosis not present

## 2015-06-24 DIAGNOSIS — Z992 Dependence on renal dialysis: Secondary | ICD-10-CM | POA: Diagnosis not present

## 2015-06-24 DIAGNOSIS — I120 Hypertensive chronic kidney disease with stage 5 chronic kidney disease or end stage renal disease: Secondary | ICD-10-CM | POA: Diagnosis not present

## 2015-06-24 DIAGNOSIS — D509 Iron deficiency anemia, unspecified: Secondary | ICD-10-CM | POA: Diagnosis not present

## 2015-06-24 DIAGNOSIS — N2581 Secondary hyperparathyroidism of renal origin: Secondary | ICD-10-CM | POA: Diagnosis not present

## 2015-06-26 DIAGNOSIS — A4902 Methicillin resistant Staphylococcus aureus infection, unspecified site: Secondary | ICD-10-CM | POA: Diagnosis not present

## 2015-06-26 DIAGNOSIS — N186 End stage renal disease: Secondary | ICD-10-CM | POA: Diagnosis not present

## 2015-06-26 DIAGNOSIS — N2581 Secondary hyperparathyroidism of renal origin: Secondary | ICD-10-CM | POA: Diagnosis not present

## 2015-06-26 DIAGNOSIS — T827XXA Infection and inflammatory reaction due to other cardiac and vascular devices, implants and grafts, initial encounter: Secondary | ICD-10-CM | POA: Diagnosis not present

## 2015-06-29 DIAGNOSIS — A4902 Methicillin resistant Staphylococcus aureus infection, unspecified site: Secondary | ICD-10-CM | POA: Diagnosis not present

## 2015-06-29 DIAGNOSIS — N186 End stage renal disease: Secondary | ICD-10-CM | POA: Diagnosis not present

## 2015-06-29 DIAGNOSIS — N2581 Secondary hyperparathyroidism of renal origin: Secondary | ICD-10-CM | POA: Diagnosis not present

## 2015-06-29 DIAGNOSIS — T827XXA Infection and inflammatory reaction due to other cardiac and vascular devices, implants and grafts, initial encounter: Secondary | ICD-10-CM | POA: Diagnosis not present

## 2015-07-01 DIAGNOSIS — A4902 Methicillin resistant Staphylococcus aureus infection, unspecified site: Secondary | ICD-10-CM | POA: Diagnosis not present

## 2015-07-01 DIAGNOSIS — N186 End stage renal disease: Secondary | ICD-10-CM | POA: Diagnosis not present

## 2015-07-01 DIAGNOSIS — T827XXA Infection and inflammatory reaction due to other cardiac and vascular devices, implants and grafts, initial encounter: Secondary | ICD-10-CM | POA: Diagnosis not present

## 2015-07-01 DIAGNOSIS — N2581 Secondary hyperparathyroidism of renal origin: Secondary | ICD-10-CM | POA: Diagnosis not present

## 2015-07-03 DIAGNOSIS — N2581 Secondary hyperparathyroidism of renal origin: Secondary | ICD-10-CM | POA: Diagnosis not present

## 2015-07-03 DIAGNOSIS — A4902 Methicillin resistant Staphylococcus aureus infection, unspecified site: Secondary | ICD-10-CM | POA: Diagnosis not present

## 2015-07-03 DIAGNOSIS — T827XXA Infection and inflammatory reaction due to other cardiac and vascular devices, implants and grafts, initial encounter: Secondary | ICD-10-CM | POA: Diagnosis not present

## 2015-07-03 DIAGNOSIS — N186 End stage renal disease: Secondary | ICD-10-CM | POA: Diagnosis not present

## 2015-07-06 DIAGNOSIS — A4902 Methicillin resistant Staphylococcus aureus infection, unspecified site: Secondary | ICD-10-CM | POA: Diagnosis not present

## 2015-07-06 DIAGNOSIS — T827XXA Infection and inflammatory reaction due to other cardiac and vascular devices, implants and grafts, initial encounter: Secondary | ICD-10-CM | POA: Diagnosis not present

## 2015-07-06 DIAGNOSIS — N186 End stage renal disease: Secondary | ICD-10-CM | POA: Diagnosis not present

## 2015-07-06 DIAGNOSIS — N2581 Secondary hyperparathyroidism of renal origin: Secondary | ICD-10-CM | POA: Diagnosis not present

## 2015-07-08 DIAGNOSIS — T827XXA Infection and inflammatory reaction due to other cardiac and vascular devices, implants and grafts, initial encounter: Secondary | ICD-10-CM | POA: Diagnosis not present

## 2015-07-08 DIAGNOSIS — N2581 Secondary hyperparathyroidism of renal origin: Secondary | ICD-10-CM | POA: Diagnosis not present

## 2015-07-08 DIAGNOSIS — N186 End stage renal disease: Secondary | ICD-10-CM | POA: Diagnosis not present

## 2015-07-08 DIAGNOSIS — A4902 Methicillin resistant Staphylococcus aureus infection, unspecified site: Secondary | ICD-10-CM | POA: Diagnosis not present

## 2015-07-10 DIAGNOSIS — N186 End stage renal disease: Secondary | ICD-10-CM | POA: Diagnosis not present

## 2015-07-10 DIAGNOSIS — A4902 Methicillin resistant Staphylococcus aureus infection, unspecified site: Secondary | ICD-10-CM | POA: Diagnosis not present

## 2015-07-10 DIAGNOSIS — N2581 Secondary hyperparathyroidism of renal origin: Secondary | ICD-10-CM | POA: Diagnosis not present

## 2015-07-10 DIAGNOSIS — T827XXA Infection and inflammatory reaction due to other cardiac and vascular devices, implants and grafts, initial encounter: Secondary | ICD-10-CM | POA: Diagnosis not present

## 2015-07-13 DIAGNOSIS — T827XXA Infection and inflammatory reaction due to other cardiac and vascular devices, implants and grafts, initial encounter: Secondary | ICD-10-CM | POA: Diagnosis not present

## 2015-07-13 DIAGNOSIS — N186 End stage renal disease: Secondary | ICD-10-CM | POA: Diagnosis not present

## 2015-07-13 DIAGNOSIS — N2581 Secondary hyperparathyroidism of renal origin: Secondary | ICD-10-CM | POA: Diagnosis not present

## 2015-07-13 DIAGNOSIS — A4902 Methicillin resistant Staphylococcus aureus infection, unspecified site: Secondary | ICD-10-CM | POA: Diagnosis not present

## 2015-07-15 DIAGNOSIS — A4902 Methicillin resistant Staphylococcus aureus infection, unspecified site: Secondary | ICD-10-CM | POA: Diagnosis not present

## 2015-07-15 DIAGNOSIS — N2581 Secondary hyperparathyroidism of renal origin: Secondary | ICD-10-CM | POA: Diagnosis not present

## 2015-07-15 DIAGNOSIS — N186 End stage renal disease: Secondary | ICD-10-CM | POA: Diagnosis not present

## 2015-07-15 DIAGNOSIS — T827XXA Infection and inflammatory reaction due to other cardiac and vascular devices, implants and grafts, initial encounter: Secondary | ICD-10-CM | POA: Diagnosis not present

## 2015-07-18 DIAGNOSIS — N2581 Secondary hyperparathyroidism of renal origin: Secondary | ICD-10-CM | POA: Diagnosis not present

## 2015-07-18 DIAGNOSIS — T827XXA Infection and inflammatory reaction due to other cardiac and vascular devices, implants and grafts, initial encounter: Secondary | ICD-10-CM | POA: Diagnosis not present

## 2015-07-18 DIAGNOSIS — A4902 Methicillin resistant Staphylococcus aureus infection, unspecified site: Secondary | ICD-10-CM | POA: Diagnosis not present

## 2015-07-18 DIAGNOSIS — N186 End stage renal disease: Secondary | ICD-10-CM | POA: Diagnosis not present

## 2015-07-21 DIAGNOSIS — A4902 Methicillin resistant Staphylococcus aureus infection, unspecified site: Secondary | ICD-10-CM | POA: Diagnosis not present

## 2015-07-21 DIAGNOSIS — N186 End stage renal disease: Secondary | ICD-10-CM | POA: Diagnosis not present

## 2015-07-21 DIAGNOSIS — N2581 Secondary hyperparathyroidism of renal origin: Secondary | ICD-10-CM | POA: Diagnosis not present

## 2015-07-21 DIAGNOSIS — T827XXA Infection and inflammatory reaction due to other cardiac and vascular devices, implants and grafts, initial encounter: Secondary | ICD-10-CM | POA: Diagnosis not present

## 2015-07-23 DIAGNOSIS — N2581 Secondary hyperparathyroidism of renal origin: Secondary | ICD-10-CM | POA: Diagnosis not present

## 2015-07-23 DIAGNOSIS — T827XXA Infection and inflammatory reaction due to other cardiac and vascular devices, implants and grafts, initial encounter: Secondary | ICD-10-CM | POA: Diagnosis not present

## 2015-07-23 DIAGNOSIS — A4902 Methicillin resistant Staphylococcus aureus infection, unspecified site: Secondary | ICD-10-CM | POA: Diagnosis not present

## 2015-07-23 DIAGNOSIS — N186 End stage renal disease: Secondary | ICD-10-CM | POA: Diagnosis not present

## 2015-07-24 DIAGNOSIS — Z992 Dependence on renal dialysis: Secondary | ICD-10-CM | POA: Diagnosis not present

## 2015-07-24 DIAGNOSIS — I120 Hypertensive chronic kidney disease with stage 5 chronic kidney disease or end stage renal disease: Secondary | ICD-10-CM | POA: Diagnosis not present

## 2015-07-24 DIAGNOSIS — N186 End stage renal disease: Secondary | ICD-10-CM | POA: Diagnosis not present

## 2015-07-25 DIAGNOSIS — N2581 Secondary hyperparathyroidism of renal origin: Secondary | ICD-10-CM | POA: Diagnosis not present

## 2015-07-25 DIAGNOSIS — A4902 Methicillin resistant Staphylococcus aureus infection, unspecified site: Secondary | ICD-10-CM | POA: Diagnosis not present

## 2015-07-25 DIAGNOSIS — N186 End stage renal disease: Secondary | ICD-10-CM | POA: Diagnosis not present

## 2015-07-25 DIAGNOSIS — Z23 Encounter for immunization: Secondary | ICD-10-CM | POA: Diagnosis not present

## 2015-07-25 DIAGNOSIS — D509 Iron deficiency anemia, unspecified: Secondary | ICD-10-CM | POA: Diagnosis not present

## 2015-07-27 DIAGNOSIS — A4902 Methicillin resistant Staphylococcus aureus infection, unspecified site: Secondary | ICD-10-CM | POA: Diagnosis not present

## 2015-07-27 DIAGNOSIS — D509 Iron deficiency anemia, unspecified: Secondary | ICD-10-CM | POA: Diagnosis not present

## 2015-07-27 DIAGNOSIS — N186 End stage renal disease: Secondary | ICD-10-CM | POA: Diagnosis not present

## 2015-07-27 DIAGNOSIS — Z23 Encounter for immunization: Secondary | ICD-10-CM | POA: Diagnosis not present

## 2015-07-27 DIAGNOSIS — N2581 Secondary hyperparathyroidism of renal origin: Secondary | ICD-10-CM | POA: Diagnosis not present

## 2015-07-28 DIAGNOSIS — M66262 Spontaneous rupture of extensor tendons, left lower leg: Secondary | ICD-10-CM | POA: Diagnosis not present

## 2015-07-28 DIAGNOSIS — N186 End stage renal disease: Secondary | ICD-10-CM | POA: Diagnosis not present

## 2015-07-28 HISTORY — PX: FRACTURE SURGERY: SHX138

## 2015-07-29 DIAGNOSIS — A4902 Methicillin resistant Staphylococcus aureus infection, unspecified site: Secondary | ICD-10-CM | POA: Diagnosis not present

## 2015-07-29 DIAGNOSIS — Z23 Encounter for immunization: Secondary | ICD-10-CM | POA: Diagnosis not present

## 2015-07-29 DIAGNOSIS — D509 Iron deficiency anemia, unspecified: Secondary | ICD-10-CM | POA: Diagnosis not present

## 2015-07-29 DIAGNOSIS — N186 End stage renal disease: Secondary | ICD-10-CM | POA: Diagnosis not present

## 2015-07-29 DIAGNOSIS — N2581 Secondary hyperparathyroidism of renal origin: Secondary | ICD-10-CM | POA: Diagnosis not present

## 2015-07-31 DIAGNOSIS — N2581 Secondary hyperparathyroidism of renal origin: Secondary | ICD-10-CM | POA: Diagnosis not present

## 2015-07-31 DIAGNOSIS — N186 End stage renal disease: Secondary | ICD-10-CM | POA: Diagnosis not present

## 2015-07-31 DIAGNOSIS — D509 Iron deficiency anemia, unspecified: Secondary | ICD-10-CM | POA: Diagnosis not present

## 2015-07-31 DIAGNOSIS — A4902 Methicillin resistant Staphylococcus aureus infection, unspecified site: Secondary | ICD-10-CM | POA: Diagnosis not present

## 2015-07-31 DIAGNOSIS — Z23 Encounter for immunization: Secondary | ICD-10-CM | POA: Diagnosis not present

## 2015-08-03 DIAGNOSIS — A4902 Methicillin resistant Staphylococcus aureus infection, unspecified site: Secondary | ICD-10-CM | POA: Diagnosis not present

## 2015-08-03 DIAGNOSIS — N2581 Secondary hyperparathyroidism of renal origin: Secondary | ICD-10-CM | POA: Diagnosis not present

## 2015-08-03 DIAGNOSIS — N186 End stage renal disease: Secondary | ICD-10-CM | POA: Diagnosis not present

## 2015-08-03 DIAGNOSIS — Z23 Encounter for immunization: Secondary | ICD-10-CM | POA: Diagnosis not present

## 2015-08-03 DIAGNOSIS — D509 Iron deficiency anemia, unspecified: Secondary | ICD-10-CM | POA: Diagnosis not present

## 2015-08-05 DIAGNOSIS — N186 End stage renal disease: Secondary | ICD-10-CM | POA: Diagnosis not present

## 2015-08-05 DIAGNOSIS — A4902 Methicillin resistant Staphylococcus aureus infection, unspecified site: Secondary | ICD-10-CM | POA: Diagnosis not present

## 2015-08-05 DIAGNOSIS — Z23 Encounter for immunization: Secondary | ICD-10-CM | POA: Diagnosis not present

## 2015-08-05 DIAGNOSIS — N2581 Secondary hyperparathyroidism of renal origin: Secondary | ICD-10-CM | POA: Diagnosis not present

## 2015-08-05 DIAGNOSIS — D509 Iron deficiency anemia, unspecified: Secondary | ICD-10-CM | POA: Diagnosis not present

## 2015-08-06 ENCOUNTER — Encounter: Payer: Self-pay | Admitting: Vascular Surgery

## 2015-08-07 DIAGNOSIS — N2581 Secondary hyperparathyroidism of renal origin: Secondary | ICD-10-CM | POA: Diagnosis not present

## 2015-08-07 DIAGNOSIS — D509 Iron deficiency anemia, unspecified: Secondary | ICD-10-CM | POA: Diagnosis not present

## 2015-08-07 DIAGNOSIS — A4902 Methicillin resistant Staphylococcus aureus infection, unspecified site: Secondary | ICD-10-CM | POA: Diagnosis not present

## 2015-08-07 DIAGNOSIS — N186 End stage renal disease: Secondary | ICD-10-CM | POA: Diagnosis not present

## 2015-08-07 DIAGNOSIS — Z23 Encounter for immunization: Secondary | ICD-10-CM | POA: Diagnosis not present

## 2015-08-10 DIAGNOSIS — N186 End stage renal disease: Secondary | ICD-10-CM | POA: Diagnosis not present

## 2015-08-10 DIAGNOSIS — N2581 Secondary hyperparathyroidism of renal origin: Secondary | ICD-10-CM | POA: Diagnosis not present

## 2015-08-10 DIAGNOSIS — Z23 Encounter for immunization: Secondary | ICD-10-CM | POA: Diagnosis not present

## 2015-08-10 DIAGNOSIS — D509 Iron deficiency anemia, unspecified: Secondary | ICD-10-CM | POA: Diagnosis not present

## 2015-08-10 DIAGNOSIS — A4902 Methicillin resistant Staphylococcus aureus infection, unspecified site: Secondary | ICD-10-CM | POA: Diagnosis not present

## 2015-08-11 ENCOUNTER — Ambulatory Visit: Payer: Medicare Other | Admitting: Vascular Surgery

## 2015-08-12 DIAGNOSIS — N2581 Secondary hyperparathyroidism of renal origin: Secondary | ICD-10-CM | POA: Diagnosis not present

## 2015-08-12 DIAGNOSIS — A4902 Methicillin resistant Staphylococcus aureus infection, unspecified site: Secondary | ICD-10-CM | POA: Diagnosis not present

## 2015-08-12 DIAGNOSIS — Z23 Encounter for immunization: Secondary | ICD-10-CM | POA: Diagnosis not present

## 2015-08-12 DIAGNOSIS — N186 End stage renal disease: Secondary | ICD-10-CM | POA: Diagnosis not present

## 2015-08-12 DIAGNOSIS — D509 Iron deficiency anemia, unspecified: Secondary | ICD-10-CM | POA: Diagnosis not present

## 2015-08-14 ENCOUNTER — Encounter: Payer: Self-pay | Admitting: Vascular Surgery

## 2015-08-14 DIAGNOSIS — A4902 Methicillin resistant Staphylococcus aureus infection, unspecified site: Secondary | ICD-10-CM | POA: Diagnosis not present

## 2015-08-14 DIAGNOSIS — Z23 Encounter for immunization: Secondary | ICD-10-CM | POA: Diagnosis not present

## 2015-08-14 DIAGNOSIS — N2581 Secondary hyperparathyroidism of renal origin: Secondary | ICD-10-CM | POA: Diagnosis not present

## 2015-08-14 DIAGNOSIS — N186 End stage renal disease: Secondary | ICD-10-CM | POA: Diagnosis not present

## 2015-08-14 DIAGNOSIS — D509 Iron deficiency anemia, unspecified: Secondary | ICD-10-CM | POA: Diagnosis not present

## 2015-08-17 DIAGNOSIS — Z23 Encounter for immunization: Secondary | ICD-10-CM | POA: Diagnosis not present

## 2015-08-17 DIAGNOSIS — N2581 Secondary hyperparathyroidism of renal origin: Secondary | ICD-10-CM | POA: Diagnosis not present

## 2015-08-17 DIAGNOSIS — D509 Iron deficiency anemia, unspecified: Secondary | ICD-10-CM | POA: Diagnosis not present

## 2015-08-17 DIAGNOSIS — A4902 Methicillin resistant Staphylococcus aureus infection, unspecified site: Secondary | ICD-10-CM | POA: Diagnosis not present

## 2015-08-17 DIAGNOSIS — N186 End stage renal disease: Secondary | ICD-10-CM | POA: Diagnosis not present

## 2015-08-18 ENCOUNTER — Encounter: Payer: Self-pay | Admitting: Vascular Surgery

## 2015-08-18 ENCOUNTER — Ambulatory Visit (INDEPENDENT_AMBULATORY_CARE_PROVIDER_SITE_OTHER): Payer: Medicare Other | Admitting: Vascular Surgery

## 2015-08-18 VITALS — BP 100/71 | HR 106 | Temp 98.9°F | Resp 18 | Ht 71.0 in | Wt 321.9 lb

## 2015-08-18 DIAGNOSIS — N186 End stage renal disease: Secondary | ICD-10-CM

## 2015-08-18 NOTE — Progress Notes (Signed)
    Established Dialysis Access  History of Present Illness  [name] is a 44 y.o. (Nov 02, 1970) male who presents for evaluation of his left forearm AV fistula (created 05/03/13). His main nephrologist is Dr. Posey Pronto. He was referred here due to drainage from his fistula. He reports the drainage occuring a couple weeks ago. He describes it as clear drainage and was placed on antibiotics. He denies any recent drainage from his fistula. He denies fever or chills. He denies any bleeding issues or problems with dialysis. He describes scrubbing his arm roughly in the shower causing the  scabs from his fistula to fall off. He dialyzes MWF.   Physical Examination  Filed Vitals:   08/18/15 1539  BP: 100/71  Pulse: 106  Temp: 98.9 F (37.2 C)  TempSrc: Oral  Resp: 18  Height: 5\' 11"  (1.803 m)  Weight: 321 lb 14 oz (146 kg)  SpO2: 96%   Body mass index is 44.91 kg/(m^2).  General: A&O x 3, WDWN morbidly obese male in NAD  Pulmonary: Non labored breathing  Vascular: Palpable thrill left forearm AVF, no aneurysmal changes, superficial excoriation to two areas mid forearm. No skin ulceration. No cellulitis, no drainage.   Musculoskeletal: 5/5 strength left hand grip   Neurologic: Sensation intact left hand   Medical Decision Making  [name] is a 44 y.o. male who presents with previous drainage from left forearm AV fistula.   The fistula does not appear infected. The skin lesions on his fistula appear superficial without underlying vein involvement. No aneurysmal changes to his fistula. He has not had any bleeding issues. No further interventions or workup needed at this time.  Discouraged the patient from scrubbing his left arm too hard. Advised the patient that if he does experience bleeding problems to apply single digital pressure to the bleeding site and report to the ER. He will follow up on an as needed basis.   Virgina Jock, PA-C [facility]  [place] Office: [phone] Pager: [phone]  08/18/2015, 4:09 PM  This patient was seen and examined in conjunction with Dr. Donnetta Hutching  Do not feel that this area is risk for bleeding. There is no evidence of infection. We did discuss the treatment should he have any substantial bleeding from his fistula. We'll see [place] as needed

## 2015-08-19 DIAGNOSIS — Z23 Encounter for immunization: Secondary | ICD-10-CM | POA: Diagnosis not present

## 2015-08-19 DIAGNOSIS — A4902 Methicillin resistant Staphylococcus aureus infection, unspecified site: Secondary | ICD-10-CM | POA: Diagnosis not present

## 2015-08-19 DIAGNOSIS — N2581 Secondary hyperparathyroidism of renal origin: Secondary | ICD-10-CM | POA: Diagnosis not present

## 2015-08-19 DIAGNOSIS — N186 End stage renal disease: Secondary | ICD-10-CM | POA: Diagnosis not present

## 2015-08-19 DIAGNOSIS — D509 Iron deficiency anemia, unspecified: Secondary | ICD-10-CM | POA: Diagnosis not present

## 2015-08-20 ENCOUNTER — Other Ambulatory Visit: Payer: Self-pay | Admitting: Orthopedic Surgery

## 2015-08-20 DIAGNOSIS — M25562 Pain in left knee: Secondary | ICD-10-CM | POA: Diagnosis not present

## 2015-08-21 ENCOUNTER — Encounter (HOSPITAL_COMMUNITY): Payer: Self-pay | Admitting: *Deleted

## 2015-08-21 DIAGNOSIS — Z23 Encounter for immunization: Secondary | ICD-10-CM | POA: Diagnosis not present

## 2015-08-21 DIAGNOSIS — A4902 Methicillin resistant Staphylococcus aureus infection, unspecified site: Secondary | ICD-10-CM | POA: Diagnosis not present

## 2015-08-21 DIAGNOSIS — N186 End stage renal disease: Secondary | ICD-10-CM | POA: Diagnosis not present

## 2015-08-21 DIAGNOSIS — N2581 Secondary hyperparathyroidism of renal origin: Secondary | ICD-10-CM | POA: Diagnosis not present

## 2015-08-21 DIAGNOSIS — D509 Iron deficiency anemia, unspecified: Secondary | ICD-10-CM | POA: Diagnosis not present

## 2015-08-21 MED ORDER — CHLORHEXIDINE GLUCONATE 4 % EX LIQD: 60.0000 mL | Freq: Once | CUTANEOUS | Status: DC

## 2015-08-21 MED ORDER — DEXTROSE 5 % IV SOLN
3.0000 g | INTRAVENOUS | Status: AC
  Administered 2015-08-24: 3 g via INTRAVENOUS
  Filled 2015-08-21 (×2): qty 3000

## 2015-08-21 MED ORDER — DEXTROSE-NACL 5-0.45 % IV SOLN: INTRAVENOUS | Status: DC

## 2015-08-21 NOTE — H&P (Signed)
[name] is an 44 y.o. male.   Chief Complaint: Left Knee Pain  HPI: Patient presents with a chief complaint of left knee pain.  Patient states that he was injured on 07/28/15 when he was involved in training for his job.  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  He went to stand up and felt a pop in his knee.  Patient went to the emergency room where he was told that he had a tendon rupture and will need surgery.  Today, he states his pain comes and goes and is moderate in severity.  He describes as throbbing-type pain.  He is noticed swelling and weakness.  His symptoms are worse with activity and better with elevation and pain medication.  He denies any fevers chills night sweats or other signs of infection.  He does have a significant history for a been on dialysis and he receives dialysis on Monday, Wednesdays and Friday's.  Past Medical History  Diagnosis Date  . Chronic kidney disease   . Hypertension   . Hemodialysis finding May 2 ,2005    First HD at [facility].  . Anemia     Iron deficiency  . End stage renal disease on dialysis ([place])   . Aneurysm ([place]) 02/15/2007    Right arm large aneurysm  . GERD (gastroesophageal reflux disease)   . Constipation     Past Surgical History  Procedure Laterality Date  . Ligation of arteriovenous  fistula Right 04/22/2013    Procedure: LIGATION OF ARTERIOVENOUS  FISTULA  AND RESECTION OF VENOUS ANEURYSM AND ABSCESS;  Surgeon: Rosetta Posner, MD;  Location: [place];  Service: Vascular;  Laterality: Right;  . Insertion of dialysis catheter Right 04/22/2013    Procedure: INSERTION OF DIALYSIS CATHETER;  Surgeon: Rosetta Posner, MD;  Location: [place];  Service: Vascular;  Laterality: Right;  . Av fistula placement    . Colonscopy    . Colonoscopy      Hxl: of  . Av fistula placement Left 05/03/2013    Procedure: ARTERIOVENOUS (AV) FISTULA CREATION;  Surgeon: Rosetta Posner, MD;  Location: [place];  Service:  Vascular;  Laterality: Left;  . Fistula superficialization Left 07/24/2013    Procedure: FISTULA SUPERFICIALIZATION AND LIGATION OF BRANCHES;  Surgeon: Rosetta Posner, MD;  Location: [place];  Service: Vascular;  Laterality: Left;  . Tee without cardioversion N/A 09/13/2013    Procedure: TRANSESOPHAGEAL ECHOCARDIOGRAM (TEE);  Surgeon: Sanda Klein, MD;  Location: [facility] ENDOSCOPY;  Service: Cardiovascular;  Laterality: N/A;  . Fracture surgery Left Jul 28, 2015    Petela Tendon and Fx    Family History  Problem Relation Age of Onset  . Hypertension Mother   . Heart disease Mother     before age 57  . Other Brother     DVT   Social History:  reports that he has been smoking Cigars.  He has never used smokeless tobacco. He reports that he drinks alcohol. He reports that he does not use illicit drugs.  Allergies: No Known Allergies  No prescriptions prior to admission    No results found for this or any previous visit (from the past 48 hour(s)). No results found.  Review of Systems  Constitutional: Negative.   HENT: Negative.   Eyes: Negative.   Respiratory: Negative.   Cardiovascular: Negative.   Gastrointestinal: Negative.   Genitourinary: Positive for urgency and frequency.  Dialysis  Musculoskeletal: Positive for joint pain.  Skin: Negative.   Neurological: Negative.   Endo/Heme/Allergies: Negative.   Psychiatric/Behavioral: Negative.     There were no vitals taken for this visit. Physical Exam  Constitutional: He is oriented to person, place, and time. He appears well-developed and well-nourished.  HENT:  Head: Normocephalic and atraumatic.  Eyes: Pupils are equal, round, and reactive to light.  Neck: Normal range of motion. Neck supple.  Cardiovascular: Intact distal pulses.   Respiratory: Effort normal.  Musculoskeletal: He exhibits edema and tenderness.  he patient's right knee has good strength good range of motion and no pain.  Patient's left knee has  tenderness with palpation over the superior pole of the patella and through the quadriceps tendon region.  He is unable to hold the knee up against gravity.  His calves are soft and nontender.  He is neurovascularly intact distally  Neurological: He is alert and oriented to person, place, and time.  Skin: Skin is warm and dry.  Psychiatric: He has a normal mood and affect. His behavior is normal. Judgment and thought content normal.     Assessment/Plan Assess: Left Quadriceps tendon rupture near the patella insertion  Plan: Treatment options are discussed with the patient.  This patient is also discussed with Dr. Mayer Camel who also examined the patient.  He will need to have a quadriceps tendon repair.  This will need to be scheduled around his dialysis and he has this on Monday Wednesday and Fridays.  The benefits risks and potential complications are discussed with the patient.  We anticipate seeing the patient back at time of surgical intervention.  A posting slip is completed and he is to discuss timing with Sandi Raveling our scheduler.  He is placed in a Bledsoe knee brace for support.  Call with any issues.  [name] 08/21/2015, 4:24 PM

## 2015-08-21 NOTE — Progress Notes (Signed)
Pt denies cardiac history, chest pain or sob. Pt is on dialysis and will be coming from dialysis day of surgery.   Pt has history of HTN, but no longer takes medications. Now has hypotension and takes Midodrine prior and during dialysis.  Pt states he's not sure if he has anyone that can be with him the 24 hours after surgery.

## 2015-08-24 ENCOUNTER — Encounter (HOSPITAL_COMMUNITY): Payer: Self-pay | Admitting: Certified Registered Nurse Anesthetist

## 2015-08-24 ENCOUNTER — Ambulatory Visit (HOSPITAL_COMMUNITY): Payer: Medicare Other | Admitting: Certified Registered Nurse Anesthetist

## 2015-08-24 ENCOUNTER — Ambulatory Visit (HOSPITAL_COMMUNITY)
Admission: RE | Admit: 2015-08-24 | Discharge: 2015-08-25 | Disposition: A | Discharge status: Home / Self Care | Payer: Medicare Other | Source: Ambulatory Visit | Attending: Orthopedic Surgery | Admitting: Orthopedic Surgery

## 2015-08-24 ENCOUNTER — Encounter (HOSPITAL_COMMUNITY)
Admission: RE | Disposition: A | Discharge status: Home / Self Care | Payer: Self-pay | Source: Ambulatory Visit | Attending: Orthopedic Surgery

## 2015-08-24 DIAGNOSIS — G8918 Other acute postprocedural pain: Secondary | ICD-10-CM | POA: Diagnosis not present

## 2015-08-24 DIAGNOSIS — M810 Age-related osteoporosis without current pathological fracture: Secondary | ICD-10-CM | POA: Diagnosis not present

## 2015-08-24 DIAGNOSIS — Y93B9 Activity, other involving muscle strengthening exercises: Secondary | ICD-10-CM | POA: Diagnosis not present

## 2015-08-24 DIAGNOSIS — I120 Hypertensive chronic kidney disease with stage 5 chronic kidney disease or end stage renal disease: Secondary | ICD-10-CM | POA: Diagnosis not present

## 2015-08-24 DIAGNOSIS — Z23 Encounter for immunization: Secondary | ICD-10-CM | POA: Diagnosis not present

## 2015-08-24 DIAGNOSIS — Z992 Dependence on renal dialysis: Secondary | ICD-10-CM | POA: Insufficient documentation

## 2015-08-24 DIAGNOSIS — S82002A Unspecified fracture of left patella, initial encounter for closed fracture: Secondary | ICD-10-CM | POA: Diagnosis present

## 2015-08-24 DIAGNOSIS — S82092A Other fracture of left patella, initial encounter for closed fracture: Secondary | ICD-10-CM | POA: Insufficient documentation

## 2015-08-24 DIAGNOSIS — S76119A Strain of unspecified quadriceps muscle, fascia and tendon, initial encounter: Secondary | ICD-10-CM | POA: Diagnosis present

## 2015-08-24 DIAGNOSIS — X58XXXA Exposure to other specified factors, initial encounter: Secondary | ICD-10-CM | POA: Diagnosis not present

## 2015-08-24 DIAGNOSIS — N2581 Secondary hyperparathyroidism of renal origin: Secondary | ICD-10-CM | POA: Diagnosis not present

## 2015-08-24 DIAGNOSIS — S76112A Strain of left quadriceps muscle, fascia and tendon, initial encounter: Secondary | ICD-10-CM | POA: Insufficient documentation

## 2015-08-24 DIAGNOSIS — Z6841 Body Mass Index (BMI) 40.0 and over, adult: Secondary | ICD-10-CM | POA: Diagnosis not present

## 2015-08-24 DIAGNOSIS — N186 End stage renal disease: Secondary | ICD-10-CM | POA: Insufficient documentation

## 2015-08-24 DIAGNOSIS — F17290 Nicotine dependence, other tobacco product, uncomplicated: Secondary | ICD-10-CM | POA: Insufficient documentation

## 2015-08-24 DIAGNOSIS — D509 Iron deficiency anemia, unspecified: Secondary | ICD-10-CM | POA: Diagnosis not present

## 2015-08-24 DIAGNOSIS — A4902 Methicillin resistant Staphylococcus aureus infection, unspecified site: Secondary | ICD-10-CM | POA: Diagnosis not present

## 2015-08-24 HISTORY — PX: QUADRICEPS TENDON REPAIR: SHX756

## 2015-08-24 HISTORY — DX: Constipation, unspecified: K59.00

## 2015-08-24 LAB — POCT I-STAT 4, (NA,K, GLUC, HGB,HCT)
GLUCOSE: 88 mg/dL (ref 65–99)
HCT: 36 % — ABNORMAL LOW (ref 39.0–52.0)
Hemoglobin: 12.2 g/dL — ABNORMAL LOW (ref 13.0–17.0)
Potassium: 5.6 mmol/L — ABNORMAL HIGH (ref 3.5–5.1)
Sodium: 134 mmol/L — ABNORMAL LOW (ref 135–145)

## 2015-08-24 SURGERY — REPAIR, TENDON, QUADRICEPS
Anesthesia: General | Site: Leg Upper | Laterality: Left

## 2015-08-24 MED ORDER — ESMOLOL HCL 100 MG/10ML IV SOLN
INTRAVENOUS | Status: DC | PRN
  Administered 2015-08-24: 30 mg via INTRAVENOUS
  Administered 2015-08-24: 20 mg via INTRAVENOUS

## 2015-08-24 MED ORDER — METHOCARBAMOL 500 MG PO TABS
500.0000 mg | ORAL_TABLET | Freq: Four times a day (QID) | ORAL | Status: DC | PRN
  Administered 2015-08-25: 500 mg via ORAL
  Filled 2015-08-24 (×2): qty 1

## 2015-08-24 MED ORDER — HYDROMORPHONE HCL 1 MG/ML IJ SOLN
0.5000 mg | INTRAMUSCULAR | Status: DC | PRN
  Administered 2015-08-24 (×4): 0.5 mg via INTRAVENOUS

## 2015-08-24 MED ORDER — CINACALCET HCL 30 MG PO TABS
30.0000 mg | ORAL_TABLET | Freq: Every day | ORAL | Status: DC
Start: 2015-08-24 — End: 2015-08-25
  Filled 2015-08-24: qty 1

## 2015-08-24 MED ORDER — HYDROMORPHONE HCL 1 MG/ML IJ SOLN
INTRAMUSCULAR | Status: AC
  Filled 2015-08-24: qty 2

## 2015-08-24 MED ORDER — ASPIRIN EC 325 MG PO TBEC
325.0000 mg | DELAYED_RELEASE_TABLET | Freq: Two times a day (BID) | ORAL | Status: DC
  Administered 2015-08-24 – 2015-08-25 (×2): 325 mg via ORAL
  Filled 2015-08-24 (×2): qty 1

## 2015-08-24 MED ORDER — PHENYLEPHRINE 40 MCG/ML (10ML) SYRINGE FOR IV PUSH (FOR BLOOD PRESSURE SUPPORT)
PREFILLED_SYRINGE | INTRAVENOUS | Status: AC
  Filled 2015-08-24: qty 10

## 2015-08-24 MED ORDER — ONDANSETRON HCL 4 MG PO TABS: 4.0000 mg | ORAL_TABLET | Freq: Four times a day (QID) | ORAL | Status: DC | PRN

## 2015-08-24 MED ORDER — METOCLOPRAMIDE HCL 5 MG PO TABS: 5.0000 mg | ORAL_TABLET | Freq: Three times a day (TID) | ORAL | Status: DC | PRN

## 2015-08-24 MED ORDER — NEOSTIGMINE METHYLSULFATE 10 MG/10ML IV SOLN
INTRAVENOUS | Status: DC | PRN
  Administered 2015-08-24: 5 mg via INTRAVENOUS

## 2015-08-24 MED ORDER — ESMOLOL HCL 100 MG/10ML IV SOLN
INTRAVENOUS | Status: AC
  Filled 2015-08-24: qty 10

## 2015-08-24 MED ORDER — PROPOFOL 10 MG/ML IV BOLUS
INTRAVENOUS | Status: AC
  Filled 2015-08-24: qty 20

## 2015-08-24 MED ORDER — FENTANYL CITRATE (PF) 100 MCG/2ML IJ SOLN
INTRAMUSCULAR | Status: AC
  Administered 2015-08-24: 100 ug
  Filled 2015-08-24: qty 2

## 2015-08-24 MED ORDER — GLYCOPYRROLATE 0.2 MG/ML IJ SOLN
INTRAMUSCULAR | Status: DC | PRN
  Administered 2015-08-24: .8 mg via INTRAVENOUS

## 2015-08-24 MED ORDER — PROPOFOL 10 MG/ML IV BOLUS
INTRAVENOUS | Status: DC | PRN
  Administered 2015-08-24: 100 mg via INTRAVENOUS
  Administered 2015-08-24: 200 mg via INTRAVENOUS
  Administered 2015-08-24 (×4): 50 mg via INTRAVENOUS

## 2015-08-24 MED ORDER — ONDANSETRON HCL 4 MG/2ML IJ SOLN
INTRAMUSCULAR | Status: DC | PRN
  Administered 2015-08-24: 4 mg via INTRAVENOUS

## 2015-08-24 MED ORDER — DEXTROSE-NACL 5-0.45 % IV SOLN
INTRAVENOUS | Status: DC
  Administered 2015-08-24: 20:00:00 via INTRAVENOUS

## 2015-08-24 MED ORDER — KCL IN DEXTROSE-NACL 10-5-0.45 MEQ/L-%-% IV SOLN: INTRAVENOUS | Status: DC

## 2015-08-24 MED ORDER — MIDAZOLAM HCL 2 MG/2ML IJ SOLN
INTRAMUSCULAR | Status: AC
  Filled 2015-08-24: qty 4

## 2015-08-24 MED ORDER — PHENYLEPHRINE HCL 10 MG/ML IJ SOLN
INTRAMUSCULAR | Status: DC | PRN
  Administered 2015-08-24 (×2): 80 ug via INTRAVENOUS
  Administered 2015-08-24 (×2): 120 ug via INTRAVENOUS
  Administered 2015-08-24: 80 ug via INTRAVENOUS

## 2015-08-24 MED ORDER — HYDROCODONE-ACETAMINOPHEN 5-325 MG PO TABS
1.0000 | ORAL_TABLET | ORAL | Status: DC | PRN
  Administered 2015-08-24 – 2015-08-25 (×5): 2 via ORAL
  Filled 2015-08-24 (×6): qty 2

## 2015-08-24 MED ORDER — FAMOTIDINE 20 MG PO TABS
20.0000 mg | ORAL_TABLET | Freq: Every day | ORAL | Status: DC
  Administered 2015-08-24 – 2015-08-25 (×2): 20 mg via ORAL
  Filled 2015-08-24 (×2): qty 1

## 2015-08-24 MED ORDER — CISATRACURIUM BESYLATE (PF) 10 MG/5ML IV SOLN
INTRAVENOUS | Status: DC | PRN
  Administered 2015-08-24: 12 mg via INTRAVENOUS

## 2015-08-24 MED ORDER — RENA-VITE PO TABS
1.0000 | ORAL_TABLET | Freq: Every day | ORAL | Status: DC
  Administered 2015-08-24: 1 via ORAL
  Filled 2015-08-24: qty 1

## 2015-08-24 MED ORDER — PROMETHAZINE HCL 25 MG/ML IJ SOLN: 6.2500 mg | INTRAMUSCULAR | Status: DC | PRN

## 2015-08-24 MED ORDER — 0.9 % SODIUM CHLORIDE (POUR BTL) OPTIME
TOPICAL | Status: DC | PRN
  Administered 2015-08-24: 1000 mL

## 2015-08-24 MED ORDER — GLYCOPYRROLATE 0.2 MG/ML IJ SOLN
INTRAMUSCULAR | Status: AC
  Filled 2015-08-24: qty 4

## 2015-08-24 MED ORDER — SODIUM CHLORIDE 0.9 % IV SOLN
INTRAVENOUS | Status: DC
  Administered 2015-08-24 (×2): via INTRAVENOUS

## 2015-08-24 MED ORDER — OXYCODONE-ACETAMINOPHEN 5-325 MG PO TABS: 1.0000 | ORAL_TABLET | ORAL | Status: DC | PRN

## 2015-08-24 MED ORDER — FENTANYL CITRATE (PF) 100 MCG/2ML IJ SOLN
INTRAMUSCULAR | Status: DC | PRN
  Administered 2015-08-24: 100 ug via INTRAVENOUS
  Administered 2015-08-24 (×5): 50 ug via INTRAVENOUS
  Administered 2015-08-24: 100 ug via INTRAVENOUS
  Administered 2015-08-24: 50 ug via INTRAVENOUS

## 2015-08-24 MED ORDER — HYDROMORPHONE HCL 1 MG/ML IJ SOLN
0.2500 mg | INTRAMUSCULAR | Status: DC | PRN
  Administered 2015-08-24 (×4): 0.5 mg via INTRAVENOUS

## 2015-08-24 MED ORDER — ONDANSETRON HCL 4 MG/2ML IJ SOLN: 4.0000 mg | Freq: Four times a day (QID) | INTRAMUSCULAR | Status: DC | PRN

## 2015-08-24 MED ORDER — BUPIVACAINE-EPINEPHRINE (PF) 0.5% -1:200000 IJ SOLN
INTRAMUSCULAR | Status: DC | PRN
  Administered 2015-08-24: 30 mL via PERINEURAL

## 2015-08-24 MED ORDER — METHOCARBAMOL 1000 MG/10ML IJ SOLN
500.0000 mg | Freq: Four times a day (QID) | INTRAMUSCULAR | Status: DC | PRN
  Administered 2015-08-24: 500 mg via INTRAVENOUS
  Filled 2015-08-24 (×2): qty 5

## 2015-08-24 MED ORDER — MIDAZOLAM HCL 2 MG/2ML IJ SOLN
INTRAMUSCULAR | Status: AC
  Administered 2015-08-24: 2 mg
  Filled 2015-08-24: qty 2

## 2015-08-24 MED ORDER — TRANEXAMIC ACID 1000 MG/10ML IV SOLN
2000.0000 mg | Freq: Once | INTRAVENOUS | Status: AC
  Administered 2015-08-24: 2000 mg via TOPICAL
  Filled 2015-08-24: qty 20

## 2015-08-24 MED ORDER — ASPIRIN EC 325 MG PO TBEC: 325.0000 mg | DELAYED_RELEASE_TABLET | Freq: Two times a day (BID) | ORAL | Status: DC

## 2015-08-24 MED ORDER — SUCROFERRIC OXYHYDROXIDE 500 MG PO CHEW
1000.0000 mg | CHEWABLE_TABLET | Freq: Three times a day (TID) | ORAL | Status: DC
  Administered 2015-08-25: 1000 mg via ORAL
  Filled 2015-08-24 (×4): qty 2

## 2015-08-24 MED ORDER — FENTANYL CITRATE (PF) 250 MCG/5ML IJ SOLN
INTRAMUSCULAR | Status: AC
  Filled 2015-08-24: qty 5

## 2015-08-24 MED ORDER — CISATRACURIUM BESYLATE 20 MG/10ML IV SOLN
INTRAVENOUS | Status: AC
  Filled 2015-08-24: qty 10

## 2015-08-24 MED ORDER — METOCLOPRAMIDE HCL 5 MG/ML IJ SOLN: 5.0000 mg | Freq: Three times a day (TID) | INTRAMUSCULAR | Status: DC | PRN

## 2015-08-24 SURGICAL SUPPLY — 68 items
BANDAGE ELASTIC 4 VELCRO ST LF (GAUZE/BANDAGES/DRESSINGS) ×3 IMPLANT
BANDAGE ELASTIC 6 VELCRO ST LF (GAUZE/BANDAGES/DRESSINGS) ×3 IMPLANT
BANDAGE ESMARK 6X9 LF (GAUZE/BANDAGES/DRESSINGS) ×1 IMPLANT
BIT DRILL 7/64X5 DISP (BIT) IMPLANT
BNDG COHESIVE 4X5 TAN STRL (GAUZE/BANDAGES/DRESSINGS) ×3 IMPLANT
BNDG COHESIVE 6X5 TAN STRL LF (GAUZE/BANDAGES/DRESSINGS) ×3 IMPLANT
BNDG ESMARK 6X9 LF (GAUZE/BANDAGES/DRESSINGS) ×3
COVER MAYO STAND STRL (DRAPES) IMPLANT
CUFF TOURNIQUET SINGLE 34IN LL (TOURNIQUET CUFF) ×3 IMPLANT
CUFF TOURNIQUET SINGLE 44IN (TOURNIQUET CUFF) IMPLANT
DRAPE INCISE IOBAN 66X45 STRL (DRAPES) ×3 IMPLANT
DRAPE U-SHAPE 47X51 STRL (DRAPES) ×3 IMPLANT
DRSG PAD ABDOMINAL 8X10 ST (GAUZE/BANDAGES/DRESSINGS) ×3 IMPLANT
DURAPREP 26ML APPLICATOR (WOUND CARE) ×3 IMPLANT
ELECT REM PT RETURN 9FT ADLT (ELECTROSURGICAL) ×3
ELECTRODE REM PT RTRN 9FT ADLT (ELECTROSURGICAL) ×1 IMPLANT
FACESHIELD WRAPAROUND (MASK) ×3 IMPLANT
GAUZE SPONGE 4X4 12PLY STRL (GAUZE/BANDAGES/DRESSINGS) ×3 IMPLANT
GAUZE XEROFORM 1X8 LF (GAUZE/BANDAGES/DRESSINGS) ×3 IMPLANT
GLOVE BIO SURGEON STRL SZ7.5 (GLOVE) ×3 IMPLANT
GLOVE BIO SURGEON STRL SZ8.5 (GLOVE) ×3 IMPLANT
GLOVE BIOGEL PI IND STRL 8 (GLOVE) ×1 IMPLANT
GLOVE BIOGEL PI IND STRL 9 (GLOVE) ×1 IMPLANT
GLOVE BIOGEL PI INDICATOR 8 (GLOVE) ×2
GLOVE BIOGEL PI INDICATOR 9 (GLOVE) ×2
GOWN EXTRA PROTECTION XXL 0583 (GOWNS) IMPLANT
GOWN STRL REUS W/ TWL LRG LVL3 (GOWN DISPOSABLE) ×1 IMPLANT
GOWN STRL REUS W/ TWL XL LVL3 (GOWN DISPOSABLE) ×2 IMPLANT
GOWN STRL REUS W/TWL LRG LVL3 (GOWN DISPOSABLE) ×2
GOWN STRL REUS W/TWL XL LVL3 (GOWN DISPOSABLE) ×4
IMMOBILIZER KNEE 22 UNIV (SOFTGOODS) IMPLANT
KIT BASIN OR (CUSTOM PROCEDURE TRAY) ×3 IMPLANT
KIT ROOM TURNOVER OR (KITS) ×3 IMPLANT
MANIFOLD NEPTUNE II (INSTRUMENTS) ×3 IMPLANT
NEEDLE 22X1 1/2 (OR ONLY) (NEEDLE) IMPLANT
NS IRRIG 1000ML POUR BTL (IV SOLUTION) ×3 IMPLANT
PACK ORTHO EXTREMITY (CUSTOM PROCEDURE TRAY) ×3 IMPLANT
PAD ARMBOARD 7.5X6 YLW CONV (MISCELLANEOUS) ×6 IMPLANT
PAD CAST 4YDX4 CTTN HI CHSV (CAST SUPPLIES) ×1 IMPLANT
PADDING CAST COTTON 4X4 STRL (CAST SUPPLIES) ×2
PADDING CAST COTTON 6X4 STRL (CAST SUPPLIES) ×3 IMPLANT
PASSER SUT SWANSON 36MM LOOP (INSTRUMENTS) ×3 IMPLANT
SPONGE LAP 18X18 X RAY DECT (DISPOSABLE) ×3 IMPLANT
STAPLER VISISTAT 35W (STAPLE) IMPLANT
STOCKINETTE IMPERVIOUS 9X36 MD (GAUZE/BANDAGES/DRESSINGS) ×3 IMPLANT
STOCKINETTE IMPERVIOUS LG (DRAPES) ×3 IMPLANT
SUCTION FRAZIER TIP 10 FR DISP (SUCTIONS) IMPLANT
SUT ETHIBOND 2 V 37 (SUTURE) ×3 IMPLANT
SUT FIBERWIRE #2 38 REV NDL BL (SUTURE)
SUT FIBERWIRE #2 38 T-5 BLUE (SUTURE) ×6
SUT FIBERWIRE #5 38 CONV NDL (SUTURE) ×6
SUT PDS AB 4-0 P3 18 (SUTURE) IMPLANT
SUT VIC AB 1 CT1 27 (SUTURE) ×2
SUT VIC AB 1 CT1 27XBRD ANBCTR (SUTURE) ×1 IMPLANT
SUT VIC AB 1 CTX 36 (SUTURE) ×2
SUT VIC AB 1 CTX36XBRD ANBCTR (SUTURE) ×1 IMPLANT
SUT VIC AB 2-0 CTB1 (SUTURE) ×3 IMPLANT
SUTURE FIBERWR #2 38 T-5 BLUE (SUTURE) ×2 IMPLANT
SUTURE FIBERWR #5 38 CONV NDL (SUTURE) ×2 IMPLANT
SUTURE FIBERWR#2 38 REV NDL BL (SUTURE) IMPLANT
SYR CONTROL 10ML LL (SYRINGE) IMPLANT
TOWEL OR 17X24 6PK STRL BLUE (TOWEL DISPOSABLE) ×3 IMPLANT
TOWEL OR 17X26 10 PK STRL BLUE (TOWEL DISPOSABLE) ×3 IMPLANT
TUBE CONNECTING 12'X1/4 (SUCTIONS) ×1
TUBE CONNECTING 12X1/4 (SUCTIONS) ×2 IMPLANT
UNDERPAD 30X30 INCONTINENT (UNDERPADS AND DIAPERS) ×3 IMPLANT
WATER STERILE IRR 1000ML POUR (IV SOLUTION) IMPLANT
YANKAUER SUCT BULB TIP NO VENT (SUCTIONS) ×3 IMPLANT

## 2015-08-24 NOTE — Anesthesia Procedure Notes (Addendum)
Anesthesia Regional Block:  Femoral nerve block  Pre-Anesthetic Checklist: ,, timeout performed, Correct Patient, Correct Site, Correct Laterality, Correct Procedure, Correct Position, site marked, Risks and benefits discussed,  Surgical consent,  Pre-op evaluation,  At surgeon's request and post-op pain management  Laterality: Left  Prep: chloraprep       Needles:  Injection technique: Single-shot  Needle Type: Echogenic Stimulator Needle     Needle Length: 5cm 5 cm Needle Gauge: 22 and 22 G    Additional Needles:  Procedures: ultrasound guided (picture in chart) and nerve stimulator Femoral nerve block  Nerve Stimulator or Paresthesia:  Response: quadraceps contraction, 0.45 mA,   Additional Responses:   Narrative:  Start time: 08/24/2015 12:38 PM End time: 08/24/2015 12:40 PM Injection made incrementally with aspirations every 5 mL.  Performed by: Personally  Anesthesiologist: Duane Boston  Additional Notes: Functioning IV was confirmed and monitors were applied.  A 5mm 22ga Arrow echogenic stimulator needle was used. Sterile prep and drape,hand hygiene and sterile gloves were used. Ultrasound guidance: relevant anatomy identified, needle position confirmed, local anesthetic spread visualized around nerve(s)., vascular puncture avoided.  Image printed for medical record. Negative aspiration and negative test dose prior to incremental administration of local anesthetic. The patient tolerated the procedure well.     Procedure Name: Intubation Date/Time: 08/24/2015 1:20 PM Performed by: Salli Quarry Mistey Hoffert Pre-anesthesia Checklist: Patient identified, Emergency Drugs available, Suction available and Patient being monitored Patient Re-evaluated:Patient Re-evaluated prior to inductionOxygen Delivery Method: Circle system utilized Preoxygenation: Pre-oxygenation with 100% oxygen Intubation Type: IV induction Ventilation: Mask ventilation without  difficulty Laryngoscope Size: Mac and 3 Grade View: Grade II Tube type: Oral Tube size: 7.0 mm Number of attempts: 1 Airway Equipment and Method: Stylet Placement Confirmation: ETT inserted through vocal cords under direct vision,  positive ETCO2 and breath sounds checked- equal and bilateral Secured at: 23 cm Tube secured with: Tape Dental Injury: Teeth and Oropharynx as per pre-operative assessment

## 2015-08-24 NOTE — Interval H&P Note (Signed)
History and Physical Interval Note:  08/24/2015 12:19 PM  [name]  has presented today for surgery, with the diagnosis of QUAD TENDON RUPTURE ON LEFT  The various methods of treatment have been discussed with the patient and family. After consideration of risks, benefits and other options for treatment, the patient has consented to  Procedure(s): REPAIR QUADRICEP TENDON (Left) as a surgical intervention .  The patient's history has been reviewed, patient examined, no change in status, stable for surgery.  I have reviewed the patient's chart and labs.  Questions were answered to the patient's satisfaction.     Kerin Salen

## 2015-08-24 NOTE — Anesthesia Preprocedure Evaluation (Addendum)
Anesthesia Evaluation  Patient identified by MRN, date of birth, ID band Patient awake    Reviewed: Allergy & Precautions, NPO status   History of Anesthesia Complications Negative for: history of anesthetic complications  Airway Mallampati: II  TM Distance: >3 FB Neck ROM: Full    Dental no notable dental hx. (+) Dental Advisory Given   Pulmonary Current Smoker,    Pulmonary exam normal        Cardiovascular hypertension, Normal cardiovascular exam     Neuro/Psych negative neurological ROS  negative psych ROS   GI/Hepatic Neg liver ROS, GERD  ,  Endo/Other    Renal/GU Dialysis, ESRF and CRFRenal disease     Musculoskeletal   Abdominal   Peds  Hematology   Anesthesia Other Findings   Reproductive/Obstetrics                           Anesthesia Physical Anesthesia Plan  ASA: III  Anesthesia Plan: General   Post-op Pain Management:    Induction: Intravenous  Airway Management Planned: Oral ETT  Additional Equipment:   Intra-op Plan:   Post-operative Plan: Extubation in OR  Informed Consent: I have reviewed the patients History and Physical, chart, labs and discussed the procedure including the risks, benefits and alternatives for the proposed anesthesia with the patient or authorized representative who has indicated his/her understanding and acceptance.   Dental advisory given  Plan Discussed with: CRNA, Anesthesiologist and Surgeon  Anesthesia Plan Comments:         Anesthesia Quick Evaluation

## 2015-08-24 NOTE — Op Note (Signed)
Pre Op Dx: Left Quadriceps Tear, Proximal patella Fx  Post Op Dx: Same  Procedure: Excision of left proximal patella fracture with primary repair of left quadriceps tendon using 2 #5 FiberWire core sutures oversewn with #1 running Vicryl  Surgeon: Kerin Salen, MD  Assistant: Kerry Hough. Barton Dubois  (present throughout entire procedure and necessary for timely completion of the procedure)  Anesthesia: General  EBL: 100 mL  Fluids: 1000 mL crystalloid  Antibiotics: 3 g Ancef.  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 The patient has been on hemodialysis for a number of years and is felt to have osteoporosis secondary to his renal disease. He was denied by worker's comp which delayed his evaluation with [place] for about 2 weeks. It is felt this almost represents a possible logical fracture and rupture of the patellar tendon secondary to his renal disease and BMI of 56. In any event he is taken for resection of the proximal pole of patella and primary repair of the quadriceps tendon  Procedure: Patient underwent dialysis this morning and then was taken to the operating room, and hospital where the appropriate anesthetic monitors were attached and general endotracheal anesthesia induced with the patient supine position. Tourniquet was applied high to the left thigh and the left lower Charney prepped draped sterile fashion from the ankle to the midthigh. Time out procedure was performed, limb wrapped with an Esmarch bandage, knee flexed in 90 tourniquet inflated to 350 mmHg. We began the procedure by making a 12 cm incision centered over the patella going proximally and distally through skin and subcutaneous tissue has since we cut through the subcutaneous tissue encounter  joint fluid coming out through the rupture at the insertion of the patellar tendon with the proximal pole of the patella attached. We excised the cuff of tissue containing the proximal pole of patella and then placed 3 vertical drill holes in the patella knowing the bone was relatively soft for the patient's age and body habitus. These are made of a 3.2 drill and we also passed loop sutures to allow passage of the tendon sutures when needed. We then directed our attention to the quadriceps tendon and placed 2 #5 FiberWire sutures using a running whipstitch going out medially and down centrally and then up laterally and down centrally. The shuttle sutures were used to take the most medial strands through the medial hole the 2 central strands through the central hole from proximal to distal and the lateral strand through the lateral hole. The knee was then placed in full extension and we tunneled one limb of the suture under the tendon from central to lateral and central medial allowing [place] good firm tension as we tied the sutures with a minimum of 6 throws each. The knee was then taken the range of motion from 0-45 and no gapping was noted. We then oversewed the medial retinaculum the tendon repair in the lateral retinaculum with #1 Vicryl suture. The tourniquet was let down small bleeders identified and cauterized the wound was then rinsed with Trans Am a casted and closed in layers with 0 Vicryl and 2-0 Vicryl suture in the subcutaneous tissue and 3-0 Vicryl subcuticular. A dressing of Xerofoam 4 x 4 dressing sponges web roll Ace wrap and an  articulated knee immobilizer were then applied patient was awakened extubated and taken to the recovery without difficulty and dictation

## 2015-08-24 NOTE — Transfer of Care (Signed)
Immediate Anesthesia Transfer of Care Note  Patient: [name]  Procedure(s) Performed: Procedure(s): REPAIR OF LEFT QUADRICEP TENDON (Left)  Patient Location: PACU  Anesthesia Type:General and Regional  Level of Consciousness: awake, alert , oriented and patient cooperative  Airway & Oxygen Therapy: Patient Spontanous Breathing and Patient connected to nasal cannula oxygen  Post-op Assessment: Report given to RN and Post -op Vital signs reviewed and stable  Post vital signs: Reviewed and stable  Last Vitals:  Filed Vitals:   08/24/15 1240  BP: 104/78  Pulse: 91  Temp:   Resp: 14    Complications: No apparent anesthesia complications

## 2015-08-25 ENCOUNTER — Encounter (HOSPITAL_COMMUNITY): Payer: Self-pay | Admitting: Orthopedic Surgery

## 2015-08-25 DIAGNOSIS — S82092A Other fracture of left patella, initial encounter for closed fracture: Secondary | ICD-10-CM | POA: Diagnosis not present

## 2015-08-25 DIAGNOSIS — Z992 Dependence on renal dialysis: Secondary | ICD-10-CM | POA: Diagnosis not present

## 2015-08-25 DIAGNOSIS — M810 Age-related osteoporosis without current pathological fracture: Secondary | ICD-10-CM | POA: Diagnosis not present

## 2015-08-25 DIAGNOSIS — S76112A Strain of left quadriceps muscle, fascia and tendon, initial encounter: Secondary | ICD-10-CM | POA: Diagnosis not present

## 2015-08-25 DIAGNOSIS — Z6841 Body Mass Index (BMI) 40.0 and over, adult: Secondary | ICD-10-CM | POA: Diagnosis not present

## 2015-08-25 DIAGNOSIS — I120 Hypertensive chronic kidney disease with stage 5 chronic kidney disease or end stage renal disease: Secondary | ICD-10-CM | POA: Diagnosis not present

## 2015-08-25 NOTE — Progress Notes (Signed)
D/C teaching and AVS provided by charge RN. Will be taken to [facility] tower exit via w/c by this RN to meet family to drive home.

## 2015-08-25 NOTE — Progress Notes (Signed)
Patient ID: [name], male   DOB: 1971-07-11, 44 y.o.   MRN: [id number] Subjective: Patient awake, minimal pain, taking by mouth well no nausea or vomiting. Has not been seen by physical therapy yet.  Objective: Patient is afebrile, wound is dry, locked Bledsoe braces in place he has good power to dorsiflexion and plantar flexion of the foot.  Assessment: Doing well after left quadriceps tendon repair in a patient has been on dialysis for 14 years and has a BMI of 54.  Plan: We discussed the situation specifically his relatively weak bone with osteoporosis that I was able to drive a needle through at surgery. He will be in the Bledsoe brace locked probably for 6-8 weeks and then will gradually resume motion. His brothers post come by today to pick him up and take him home he understands that he takes the brace often bends his knee repair may very well fair because of the relatively weak condition of his tissues secondary to his prolonged dialysis. I will see him back in the office in one week.

## 2015-08-25 NOTE — Discharge Summary (Signed)
Patient ID: RACE HIDER MRN: [id number] DOB/AGE: [birth year] 46 y.o.  Admit date: 08/24/2015 Discharge date: 08/25/2015  Admission Diagnoses:  Principal Problem:   Closed fracture of left patella Active Problems:   Quadriceps tendon rupture   Discharge Diagnoses:  Same  Past Medical History  Diagnosis Date  . Chronic kidney disease   . Hypertension   . Hemodialysis finding May 2 ,2005    First HD at [facility].  . Anemia     Iron deficiency  . End stage renal disease on dialysis ([place])   . Aneurysm ([place]) 02/15/2007    Right arm large aneurysm  . GERD (gastroesophageal reflux disease)   . Constipation     Surgeries: Procedure(s): REPAIR OF LEFT QUADRICEP TENDON on 08/24/2015   Consultants:    Discharged Condition: Improved  Hospital Course: [name] is an 44 y.o. male who was admitted 08/24/2015 for operative treatment ofClosed fracture of left patella. Patient has severe unremitting pain that affects sleep, daily activities, and work/hobbies. After pre-op clearance the patient was taken to the operating room on 08/24/2015 and underwent  Procedure(s): REPAIR OF LEFT QUADRICEP TENDON.    Patient was given perioperative antibiotics: Anti-infectives    Start     Dose/Rate Route Frequency Ordered Stop   08/24/15 1300  ceFAZolin (ANCEF) 3 g in dextrose 5 % 50 mL IVPB     3 g 160 mL/hr over 30 Minutes Intravenous To ShortStay Surgical 08/21/15 1338 08/24/15 1328       Patient was given sequential compression devices, early ambulation, and chemoprophylaxis to prevent DVT.  Patient benefited maximally from hospital stay and there were no complications.    Recent vital signs: Patient Vitals for the past 24 hrs:  BP Temp Temp src Pulse Resp SpO2 Weight  08/25/15 0506 137/83 mmHg 97.9 F (36.6 C) - 91 17 100 % -  08/25/15 0127 140/84 mmHg 98.8 F (37.1 C) Oral 92 18 100 % -  08/24/15 2050 (!) 152/79 mmHg 98.2 F (36.8 C) Oral (!) 107 18 100 % -  08/24/15  1657 (!) 144/82 mmHg - - (!) 108 (!) 22 100 % -  08/24/15 1645 - - - (!) 108 (!) 32 100 % -  08/24/15 1630 (!) 141/83 mmHg - - (!) 108 (!) 29 97 % -  08/24/15 1615 (!) 142/85 mmHg - - (!) 104 (!) 25 99 % -  08/24/15 1600 137/86 mmHg - - (!) 103 (!) 25 96 % -  08/24/15 1545 (!) 143/86 mmHg - - (!) 103 (!) 39 98 % -  08/24/15 1530 117/69 mmHg - - (!) 101 (!) 22 99 % -  08/24/15 1520 (!) 100/57 mmHg - - 100 (!) 31 100 % -  08/24/15 1515 - - - (!) 102 17 97 % -  08/24/15 1505 (!) 111/54 mmHg 97 F (36.1 C) - (!) 108 (!) 25 97 % -  08/24/15 1300 97/60 mmHg - - 95 (!) 21 100 % -  08/24/15 1240 104/78 mmHg - - 91 14 100 % -  08/24/15 1235 113/71 mmHg - - 91 19 100 % -  08/24/15 1230 117/74 mmHg - - 87 (!) 22 100 % -  08/24/15 1225 138/88 mmHg - - 89 (!) 27 100 % -  08/24/15 1145 122/76 mmHg 98 F (36.7 C) Oral 95 18 100 % (!) 145.605 kg (321 lb)     Recent laboratory studies:  Recent Labs  08/24/15 1214  HGB 12.2*  HCT  36.0*  NA 134*  K 5.6*  GLUCOSE 88     Discharge Medications:     Medication List    TAKE these medications        acetaminophen 500 MG tablet  Commonly known as:  TYLENOL  Take 1,000 mg by mouth every 6 (six) hours as needed (pain/headache).     aspirin EC 325 MG tablet  Take 1 tablet (325 mg total) by mouth 2 (two) times daily.     b complex-vitamin c-folic acid 0.8 MG Tabs tablet  Take 1 tablet by mouth daily.     cinacalcet 30 MG tablet  Commonly known as:  SENSIPAR  Take 30 mg by mouth daily with supper.     loperamide 2 MG capsule  Commonly known as:  IMODIUM  Take 1 capsule (2 mg total) by mouth as needed for diarrhea or loose stools.     midodrine 10 MG tablet  Commonly known as:  PROAMATINE  Take 10 mg by mouth See admin instructions. Take 1 tablet (10 mg) by mouth prior to dialysis and 1 tablet (10 mg) half way through dialysis - Monday, Wednesday, Friday     oxyCODONE-acetaminophen 5-325 MG tablet  Commonly known as:  PERCOCET/ROXICET   Take 1 tablet by mouth every 4 (four) hours as needed (knee pain).     oxyCODONE-acetaminophen 5-325 MG tablet  Commonly known as:  ROXICET  Take 1 tablet by mouth every 4 (four) hours as needed.     promethazine 25 MG tablet  Commonly known as:  PHENERGAN  Take 1 tablet (25 mg total) by mouth every 6 (six) hours as needed for nausea or vomiting.     ranitidine 150 MG capsule  Commonly known as:  ZANTAC  Take 150 mg by mouth daily as needed for heartburn. For heartburn     VELPHORO 500 MG chewable tablet  Generic drug:  sucroferric oxyhydroxide  Chew 1,000 mg by mouth 3 (three) times daily with meals.        Diagnostic Studies: No results found.  Disposition: 01-Home or Self Care, patient should remain in Bledsoe brace full time, weightbearing as tolerated with crutches.      Discharge Instructions    Call MD / Call 911    Complete by:  As directed   If you experience chest pain or shortness of breath, CALL 911 and be transported to the hospital emergency room.  If you develope a fever above 101 F, pus (white drainage) or increased drainage or redness at the wound, or calf pain, call your surgeon's office.     Change dressing    Complete by:  As directed   Change dressing on 5, then change the dressing daily with sterile 4 x 4 inch gauze dressing and apply TED hose.  You may clean the incision with alcohol prior to redressing.     Constipation Prevention    Complete by:  As directed   Drink plenty of fluids.  Prune juice may be helpful.  You may use a stool softener, such as Colace (over the counter) 100 mg twice a day.  Use MiraLax (over the counter) for constipation as needed.     Diet - low sodium heart healthy    Complete by:  As directed      Discharge instructions    Complete by:  As directed   Follow up in office with Dr. Mayer Camel in 7-10 days.     Driving restrictions    Complete by:  As directed   No driving for 2 weeks     Increase activity slowly as tolerated     Complete by:  As directed      Patient may shower    Complete by:  As directed   You may shower without a dressing once there is no drainage.  Do not wash over the wound.  If drainage remains, cover wound with plastic wrap and then shower.           Follow-up Information    Follow up with Kerin Salen, MD In 10 days.   Specialty:  Orthopedic Surgery   Contact information:   [place] [id number] [phone]        Signed: Kerin Salen 08/25/2015, 7:58 AM

## 2015-08-26 ENCOUNTER — Encounter (HOSPITAL_COMMUNITY): Payer: Self-pay | Admitting: *Deleted

## 2015-08-26 ENCOUNTER — Emergency Department (HOSPITAL_COMMUNITY)
Admission: EM | Admit: 2015-08-26 | Discharge: 2015-08-26 | Disposition: A | Discharge status: Home / Self Care | Payer: Medicare Other | Attending: Emergency Medicine | Admitting: Emergency Medicine

## 2015-08-26 DIAGNOSIS — Z79899 Other long term (current) drug therapy: Secondary | ICD-10-CM | POA: Diagnosis not present

## 2015-08-26 DIAGNOSIS — Z862 Personal history of diseases of the blood and blood-forming organs and certain disorders involving the immune mechanism: Secondary | ICD-10-CM | POA: Diagnosis not present

## 2015-08-26 DIAGNOSIS — K219 Gastro-esophageal reflux disease without esophagitis: Secondary | ICD-10-CM | POA: Diagnosis not present

## 2015-08-26 DIAGNOSIS — Z9889 Other specified postprocedural states: Secondary | ICD-10-CM | POA: Diagnosis not present

## 2015-08-26 DIAGNOSIS — Z992 Dependence on renal dialysis: Secondary | ICD-10-CM | POA: Diagnosis not present

## 2015-08-26 DIAGNOSIS — N186 End stage renal disease: Secondary | ICD-10-CM | POA: Diagnosis not present

## 2015-08-26 DIAGNOSIS — I120 Hypertensive chronic kidney disease with stage 5 chronic kidney disease or end stage renal disease: Secondary | ICD-10-CM | POA: Insufficient documentation

## 2015-08-26 DIAGNOSIS — N2581 Secondary hyperparathyroidism of renal origin: Secondary | ICD-10-CM | POA: Diagnosis not present

## 2015-08-26 DIAGNOSIS — Z720 Tobacco use: Secondary | ICD-10-CM | POA: Insufficient documentation

## 2015-08-26 DIAGNOSIS — M25462 Effusion, left knee: Secondary | ICD-10-CM | POA: Diagnosis not present

## 2015-08-26 DIAGNOSIS — M25562 Pain in left knee: Secondary | ICD-10-CM | POA: Insufficient documentation

## 2015-08-26 DIAGNOSIS — Z7982 Long term (current) use of aspirin: Secondary | ICD-10-CM | POA: Insufficient documentation

## 2015-08-26 DIAGNOSIS — D509 Iron deficiency anemia, unspecified: Secondary | ICD-10-CM | POA: Diagnosis not present

## 2015-08-26 DIAGNOSIS — G8918 Other acute postprocedural pain: Secondary | ICD-10-CM | POA: Insufficient documentation

## 2015-08-26 MED ORDER — HYDROMORPHONE HCL 2 MG PO TABS
2.0000 mg | ORAL_TABLET | Freq: Once | ORAL | Status: AC
  Administered 2015-08-26: 2 mg via ORAL
  Filled 2015-08-26: qty 1

## 2015-08-26 NOTE — Care Management (Signed)
ED CM met with patient and to discuss post Ed discharge plan for [facility] services patient agreeable with plan. Rolling walker ordered Referral sent to [place] ED NOTE   Date Initiated:   08/26/2015 Documentation initiated by:  Laurena Slimmer  Subjective/Objective Assessment:   Pt presented to [facility] ED with CP     Subjective/Objective Assessment Detail:   Patient presented to [facility] ED s/p knee surgery 08/23/15 at [facility]  requesting SNF.      Action/Plan:   CM consulted concerning disposition to SNF  CM reviewed patient's records. Patient is a Medicare Recipient and  surgery was done as an out patient he was placed in [place] status after surgery. CM met with patient patient reports he is on HD M/W/F, he lives alone and, does not have anyone to help him with ADL. CM explained that patient does not qualify for SNF under Medicare guidelines, he would have to pay out of pocket, Because he was not an inpatient. Patient verbalized understanding. Discussed possibilities of calling maybe church members to assist, also provided list of Private Duty Agencies with [place]. Discussed the Recommendation for   [facility] services PT,OT, patient is amendable. Offered Choice of [facility]HA patient selected [facility]. Patient will also need DME a bariatric rolling walker offered choice of DME agency patient selected [facility]. Verified contact information Referral faxed to [facility] [phone] and [facility] [phone] fax confirmation received. Updated T. Kirichencko PA-C. No further ED CM needs identified.  Action/Plan Detail: [facility] PT/OT    [facility]  Anticipated DC Date:  08/26/15   Status Recommendation to Physician:   Result of Recommendation:  Agreed    DC Planning Services  CM consult  [place] services RN, PT/OT [facility]    Choice offered to / List presented to:  C-1 Patient          Status of service:  Completed, signed off     ED Comments Detail:

## 2015-08-26 NOTE — Discharge Instructions (Signed)
Continue your pain medications at home. Ice and elevate your knee. Call Dr. Mayer Camel tomorrow and see if they can set you up for any inpatient rehab. You are also set up for at home help.

## 2015-08-26 NOTE — ED Provider Notes (Signed)
CSN: [id number]     Arrival date & time 08/26/15  1404 History   First MD Initiated Contact with Patient 08/26/15 1506     Chief Complaint  Patient presents with  . Post-op Problem     (Consider location/radiation/quality/duration/timing/severity/associated sxs/prior Treatment) HPI [name] is a 44 y.o. male with history of end-stage renal disease on hemodialysis, hypertension, presents to emergency department complaining of left knee pain. Patient states he had a quadricept tendon repair 3 days ago. States since then he has had increasing pain. He states yesterday he left his leg follows the pillow accidentally which caused increased pain in the left knee. Patient states today while at hemodialysis and did that again. He states he is unable to control his pain at home. He takes Percocet 2 tablets every 4 hours for pain with no relief of his symptoms. Patient states that he is unable to take care of himself at home and is requesting possible placement to rehabilitation facility. Denies any new injuries. Denies any numbness or weakness distally to the leg. No fever or chills. No other concerns.  Past Medical History  Diagnosis Date  . Chronic kidney disease   . Hypertension   . Hemodialysis finding May 2 ,2005    First HD at [facility].  . Anemia     Iron deficiency  . End stage renal disease on dialysis ([place])   . Aneurysm ([place]) 02/15/2007    Right arm large aneurysm  . GERD (gastroesophageal reflux disease)   . Constipation    Past Surgical History  Procedure Laterality Date  . Ligation of arteriovenous  fistula Right 04/22/2013    Procedure: LIGATION OF ARTERIOVENOUS  FISTULA  AND RESECTION OF VENOUS ANEURYSM AND ABSCESS;  Surgeon: Rosetta Posner, MD;  Location: [place];  Service: Vascular;  Laterality: Right;  . Insertion of dialysis catheter Right 04/22/2013    Procedure: INSERTION OF DIALYSIS CATHETER;  Surgeon: Rosetta Posner, MD;  Location: [place];  Service: Vascular;   Laterality: Right;  . Av fistula placement    . Colonscopy    . Colonoscopy      Hxl: of  . Av fistula placement Left 05/03/2013    Procedure: ARTERIOVENOUS (AV) FISTULA CREATION;  Surgeon: Rosetta Posner, MD;  Location: [place];  Service: Vascular;  Laterality: Left;  . Fistula superficialization Left 07/24/2013    Procedure: FISTULA SUPERFICIALIZATION AND LIGATION OF BRANCHES;  Surgeon: Rosetta Posner, MD;  Location: [place];  Service: Vascular;  Laterality: Left;  . Tee without cardioversion N/A 09/13/2013    Procedure: TRANSESOPHAGEAL ECHOCARDIOGRAM (TEE);  Surgeon: Sanda Klein, MD;  Location: [facility] ENDOSCOPY;  Service: Cardiovascular;  Laterality: N/A;  . Fracture surgery Left Jul 28, 2015    Petela Tendon and Fx  . Quadriceps tendon repair Left 08/24/2015    Procedure: REPAIR OF LEFT QUADRICEP TENDON;  Surgeon: Frederik Pear, MD;  Location: [place];  Service: Orthopedics;  Laterality: Left;   Family History  Problem Relation Age of Onset  . Hypertension Mother   . Heart disease Mother     before age 30  . Other Brother     DVT   Social History  Substance Use Topics  . Smoking status: Light Tobacco Smoker    Types: Cigars  . Smokeless tobacco: Never Used     Comment: pt states he smokes about 3 cigars per year  . Alcohol Use: Yes     Comment: social    Review of  Systems  Constitutional: Negative for fever and chills.  Respiratory: Negative for cough, chest tightness and shortness of breath.   Cardiovascular: Negative for chest pain, palpitations and leg swelling.  Gastrointestinal: Negative for nausea, vomiting, abdominal pain, diarrhea and abdominal distention.  Musculoskeletal: Positive for myalgias, joint swelling and arthralgias. Negative for neck pain and neck stiffness.  Skin: Negative for rash.  Allergic/Immunologic: Negative for immunocompromised state.  Neurological: Negative for dizziness, weakness, light-headedness, numbness and headaches.      Allergies  Review of  patient's allergies indicates no known allergies.  Home Medications   Prior to Admission medications   Medication Sig Start Date End Date Taking? Authorizing Provider  acetaminophen (TYLENOL) 500 MG tablet Take 1,000 mg by mouth every 6 (six) hours as needed (pain/headache).     Historical Provider, MD  aspirin EC 325 MG tablet Take 1 tablet (325 mg total) by mouth 2 (two) times daily. 08/24/15   Leighton Parody, PA-C  b complex-vitamin c-folic acid (NEPHRO-VITE) 0.8 MG TABS Take 1 tablet by mouth daily.     Historical Provider, MD  cinacalcet (SENSIPAR) 30 MG tablet Take 30 mg by mouth daily with supper.    Historical Provider, MD  loperamide (IMODIUM) 2 MG capsule Take 1 capsule (2 mg total) by mouth as needed for diarrhea or loose stools. Patient not taking: Reported on 08/21/2015 01/25/14   Merryl Hacker, MD  midodrine (PROAMATINE) 10 MG tablet Take 10 mg by mouth See admin instructions. Take 1 tablet (10 mg) by mouth prior to dialysis and 1 tablet (10 mg) half way through dialysis - Monday, Wednesday, Friday    Historical Provider, MD  oxyCODONE-acetaminophen (PERCOCET/ROXICET) 5-325 MG tablet Take 1 tablet by mouth every 4 (four) hours as needed (knee pain).  07/28/15   Historical Provider, MD  oxyCODONE-acetaminophen (ROXICET) 5-325 MG tablet Take 1 tablet by mouth every 4 (four) hours as needed. 08/24/15   Leighton Parody, PA-C  promethazine (PHENERGAN) 25 MG tablet Take 1 tablet (25 mg total) by mouth every 6 (six) hours as needed for nausea or vomiting. Patient not taking: Reported on 08/21/2015 01/25/14   Merryl Hacker, MD  ranitidine (ZANTAC) 150 MG capsule Take 150 mg by mouth daily as needed for heartburn. For heartburn    Historical Provider, MD  sucroferric oxyhydroxide (VELPHORO) 500 MG chewable tablet Chew 1,000 mg by mouth 3 (three) times daily with meals.    Historical Provider, MD   BP 112/65 mmHg  Pulse 114  Temp(Src) 98.1 F (36.7 C) (Oral)  Resp 18  SpO2  94% Physical Exam  Constitutional: He is oriented to person, place, and time. He appears well-developed and well-nourished. No distress.  HENT:  Head: Normocephalic and atraumatic.  Eyes: Conjunctivae are normal.  Neck: Neck supple.  Cardiovascular: Normal rate, regular rhythm and normal heart sounds.   Pulmonary/Chest: Effort normal. No respiratory distress. He has no wheezes. He has no rales.  Musculoskeletal: He exhibits no edema.  Swelling noted to the left knee and calf. Midline anterior incision appears normal with no erythema, drainage, dehiscence. Pt unable to move his leg at knee joint. DP pulses intact  Neurological: He is alert and oriented to person, place, and time.  Skin: Skin is warm and dry.  Nursing note and vitals reviewed.   ED Course  Procedures (including critical care time) Labs Review Labs Reviewed - No data to display  Imaging Review No results found. I have personally reviewed and evaluated these images and lab  results as part of my medical decision-making.   EKG Interpretation None      MDM   Final diagnoses:  Post-operative pain  Left knee pain   Patient emergency department with worsening pain to the left knee, status post quadriceps tendon repair 3 days ago. No major injuries. I unwrapped the knee, incision appears to be healing well. No evidence of infection to the leg. Will get social work involved and contact patient's orthopedist.   4:38 PM Spoke with Mariann Laster, Tourist information centre manager, she will come by and speak with pt.   6:08 PM Spoke with Artis Delay for orthopedics, and agree with the plan.  Patient again uncomfortable going home. Mariann Laster spoke with patient again, patient has OT, PT, RN set up for home starting tomorrow. Also given an order for bariatric walker. Patient will be discharged home, instructed to call orthopedist tomorrow for further instructions.  Filed Vitals:   08/26/15 1800 08/26/15 1815 08/26/15 1830 08/26/15 1900  BP: 122/78 126/76  125/79 101/69  Pulse: 107 106 108 103  Temp:      TempSrc:      Resp: 32 14 27 20   SpO2: 100% 98% 98% 98%       Jeannett Senior, PA-C 08/26/15 2003  Dorie Rank, MD 08/28/15 1226

## 2015-08-26 NOTE — ED Notes (Signed)
Pt has a repair of a tendon in his left lower leg on Monday. Pt states that he feels a pulling sensation at his incision site. Pt states that he let his leg drop to fast once and now has worsening pain.

## 2015-08-26 NOTE — ED Notes (Signed)
Pt. Given a box of lunch.

## 2015-08-26 NOTE — Anesthesia Postprocedure Evaluation (Signed)
  Anesthesia Post-op Note  Patient: [name]  Procedure(s) Performed: Procedure(s): REPAIR OF LEFT QUADRICEP TENDON (Left)  Patient Location: PACU  Anesthesia Type:General  Level of Consciousness: awake  Airway and Oxygen Therapy: Patient Spontanous Breathing  Post-op Pain: severe  Post-op Assessment: Post-op Vital signs reviewed, Patient's Cardiovascular Status Stable, Respiratory Function Stable, Patent Airway, No signs of Nausea or vomiting and Pain level controlled              Post-op Vital Signs: Reviewed and stable  Last Vitals:  Filed Vitals:   08/25/15 1416  BP: 113/74  Pulse: 86  Temp: 36.9 C  Resp: 19    Complications: No apparent anesthesia complications

## 2015-08-26 NOTE — ED Notes (Signed)
Pt. Is a dialysis pt. His days are Monday, Wednesday and Friday.  He went today and did a complete session.  Pt. Does not make urine

## 2015-08-27 DIAGNOSIS — F17210 Nicotine dependence, cigarettes, uncomplicated: Secondary | ICD-10-CM | POA: Diagnosis not present

## 2015-08-27 DIAGNOSIS — I129 Hypertensive chronic kidney disease with stage 1 through stage 4 chronic kidney disease, or unspecified chronic kidney disease: Secondary | ICD-10-CM | POA: Diagnosis not present

## 2015-08-27 DIAGNOSIS — N186 End stage renal disease: Secondary | ICD-10-CM | POA: Diagnosis not present

## 2015-08-27 DIAGNOSIS — S76112D Strain of left quadriceps muscle, fascia and tendon, subsequent encounter: Secondary | ICD-10-CM | POA: Diagnosis not present

## 2015-08-27 DIAGNOSIS — S82002D Unspecified fracture of left patella, subsequent encounter for closed fracture with routine healing: Secondary | ICD-10-CM | POA: Diagnosis not present

## 2015-08-27 DIAGNOSIS — D509 Iron deficiency anemia, unspecified: Secondary | ICD-10-CM | POA: Diagnosis not present

## 2015-08-28 DIAGNOSIS — N186 End stage renal disease: Secondary | ICD-10-CM | POA: Diagnosis not present

## 2015-08-28 DIAGNOSIS — N2581 Secondary hyperparathyroidism of renal origin: Secondary | ICD-10-CM | POA: Diagnosis not present

## 2015-08-28 DIAGNOSIS — D509 Iron deficiency anemia, unspecified: Secondary | ICD-10-CM | POA: Diagnosis not present

## 2015-08-31 DIAGNOSIS — N2581 Secondary hyperparathyroidism of renal origin: Secondary | ICD-10-CM | POA: Diagnosis not present

## 2015-08-31 DIAGNOSIS — D509 Iron deficiency anemia, unspecified: Secondary | ICD-10-CM | POA: Diagnosis not present

## 2015-08-31 DIAGNOSIS — N186 End stage renal disease: Secondary | ICD-10-CM | POA: Diagnosis not present

## 2015-09-01 DIAGNOSIS — S82002D Unspecified fracture of left patella, subsequent encounter for closed fracture with routine healing: Secondary | ICD-10-CM | POA: Diagnosis not present

## 2015-09-01 DIAGNOSIS — F17210 Nicotine dependence, cigarettes, uncomplicated: Secondary | ICD-10-CM | POA: Diagnosis not present

## 2015-09-01 DIAGNOSIS — I129 Hypertensive chronic kidney disease with stage 1 through stage 4 chronic kidney disease, or unspecified chronic kidney disease: Secondary | ICD-10-CM | POA: Diagnosis not present

## 2015-09-01 DIAGNOSIS — D509 Iron deficiency anemia, unspecified: Secondary | ICD-10-CM | POA: Diagnosis not present

## 2015-09-01 DIAGNOSIS — N186 End stage renal disease: Secondary | ICD-10-CM | POA: Diagnosis not present

## 2015-09-01 DIAGNOSIS — S76112D Strain of left quadriceps muscle, fascia and tendon, subsequent encounter: Secondary | ICD-10-CM | POA: Diagnosis not present

## 2015-09-02 DIAGNOSIS — D509 Iron deficiency anemia, unspecified: Secondary | ICD-10-CM | POA: Diagnosis not present

## 2015-09-02 DIAGNOSIS — N186 End stage renal disease: Secondary | ICD-10-CM | POA: Diagnosis not present

## 2015-09-02 DIAGNOSIS — N2581 Secondary hyperparathyroidism of renal origin: Secondary | ICD-10-CM | POA: Diagnosis not present

## 2015-09-03 DIAGNOSIS — I129 Hypertensive chronic kidney disease with stage 1 through stage 4 chronic kidney disease, or unspecified chronic kidney disease: Secondary | ICD-10-CM | POA: Diagnosis not present

## 2015-09-03 DIAGNOSIS — F17210 Nicotine dependence, cigarettes, uncomplicated: Secondary | ICD-10-CM | POA: Diagnosis not present

## 2015-09-03 DIAGNOSIS — S76112D Strain of left quadriceps muscle, fascia and tendon, subsequent encounter: Secondary | ICD-10-CM | POA: Diagnosis not present

## 2015-09-03 DIAGNOSIS — S82002D Unspecified fracture of left patella, subsequent encounter for closed fracture with routine healing: Secondary | ICD-10-CM | POA: Diagnosis not present

## 2015-09-03 DIAGNOSIS — D509 Iron deficiency anemia, unspecified: Secondary | ICD-10-CM | POA: Diagnosis not present

## 2015-09-03 DIAGNOSIS — N186 End stage renal disease: Secondary | ICD-10-CM | POA: Diagnosis not present

## 2015-09-04 DIAGNOSIS — D509 Iron deficiency anemia, unspecified: Secondary | ICD-10-CM | POA: Diagnosis not present

## 2015-09-04 DIAGNOSIS — N186 End stage renal disease: Secondary | ICD-10-CM | POA: Diagnosis not present

## 2015-09-04 DIAGNOSIS — N2581 Secondary hyperparathyroidism of renal origin: Secondary | ICD-10-CM | POA: Diagnosis not present

## 2015-09-07 DIAGNOSIS — N2581 Secondary hyperparathyroidism of renal origin: Secondary | ICD-10-CM | POA: Diagnosis not present

## 2015-09-07 DIAGNOSIS — D509 Iron deficiency anemia, unspecified: Secondary | ICD-10-CM | POA: Diagnosis not present

## 2015-09-07 DIAGNOSIS — N186 End stage renal disease: Secondary | ICD-10-CM | POA: Diagnosis not present

## 2015-09-08 DIAGNOSIS — S82002D Unspecified fracture of left patella, subsequent encounter for closed fracture with routine healing: Secondary | ICD-10-CM | POA: Diagnosis not present

## 2015-09-08 DIAGNOSIS — F17210 Nicotine dependence, cigarettes, uncomplicated: Secondary | ICD-10-CM | POA: Diagnosis not present

## 2015-09-08 DIAGNOSIS — S76112D Strain of left quadriceps muscle, fascia and tendon, subsequent encounter: Secondary | ICD-10-CM | POA: Diagnosis not present

## 2015-09-08 DIAGNOSIS — I129 Hypertensive chronic kidney disease with stage 1 through stage 4 chronic kidney disease, or unspecified chronic kidney disease: Secondary | ICD-10-CM | POA: Diagnosis not present

## 2015-09-08 DIAGNOSIS — D509 Iron deficiency anemia, unspecified: Secondary | ICD-10-CM | POA: Diagnosis not present

## 2015-09-08 DIAGNOSIS — N186 End stage renal disease: Secondary | ICD-10-CM | POA: Diagnosis not present

## 2015-09-09 DIAGNOSIS — D509 Iron deficiency anemia, unspecified: Secondary | ICD-10-CM | POA: Diagnosis not present

## 2015-09-09 DIAGNOSIS — N2581 Secondary hyperparathyroidism of renal origin: Secondary | ICD-10-CM | POA: Diagnosis not present

## 2015-09-09 DIAGNOSIS — N186 End stage renal disease: Secondary | ICD-10-CM | POA: Diagnosis not present

## 2015-09-10 DIAGNOSIS — S76112D Strain of left quadriceps muscle, fascia and tendon, subsequent encounter: Secondary | ICD-10-CM | POA: Diagnosis not present

## 2015-09-10 DIAGNOSIS — S82002D Unspecified fracture of left patella, subsequent encounter for closed fracture with routine healing: Secondary | ICD-10-CM | POA: Diagnosis not present

## 2015-09-10 DIAGNOSIS — F17210 Nicotine dependence, cigarettes, uncomplicated: Secondary | ICD-10-CM | POA: Diagnosis not present

## 2015-09-10 DIAGNOSIS — D509 Iron deficiency anemia, unspecified: Secondary | ICD-10-CM | POA: Diagnosis not present

## 2015-09-10 DIAGNOSIS — N186 End stage renal disease: Secondary | ICD-10-CM | POA: Diagnosis not present

## 2015-09-10 DIAGNOSIS — I129 Hypertensive chronic kidney disease with stage 1 through stage 4 chronic kidney disease, or unspecified chronic kidney disease: Secondary | ICD-10-CM | POA: Diagnosis not present

## 2015-09-11 DIAGNOSIS — N2581 Secondary hyperparathyroidism of renal origin: Secondary | ICD-10-CM | POA: Diagnosis not present

## 2015-09-11 DIAGNOSIS — N186 End stage renal disease: Secondary | ICD-10-CM | POA: Diagnosis not present

## 2015-09-11 DIAGNOSIS — D509 Iron deficiency anemia, unspecified: Secondary | ICD-10-CM | POA: Diagnosis not present

## 2015-09-14 DIAGNOSIS — N186 End stage renal disease: Secondary | ICD-10-CM | POA: Diagnosis not present

## 2015-09-14 DIAGNOSIS — N2581 Secondary hyperparathyroidism of renal origin: Secondary | ICD-10-CM | POA: Diagnosis not present

## 2015-09-14 DIAGNOSIS — D509 Iron deficiency anemia, unspecified: Secondary | ICD-10-CM | POA: Diagnosis not present

## 2015-09-15 DIAGNOSIS — S76112D Strain of left quadriceps muscle, fascia and tendon, subsequent encounter: Secondary | ICD-10-CM | POA: Diagnosis not present

## 2015-09-16 DIAGNOSIS — N2581 Secondary hyperparathyroidism of renal origin: Secondary | ICD-10-CM | POA: Diagnosis not present

## 2015-09-16 DIAGNOSIS — D509 Iron deficiency anemia, unspecified: Secondary | ICD-10-CM | POA: Diagnosis not present

## 2015-09-16 DIAGNOSIS — N186 End stage renal disease: Secondary | ICD-10-CM | POA: Diagnosis not present

## 2015-09-18 DIAGNOSIS — N186 End stage renal disease: Secondary | ICD-10-CM | POA: Diagnosis not present

## 2015-09-18 DIAGNOSIS — D509 Iron deficiency anemia, unspecified: Secondary | ICD-10-CM | POA: Diagnosis not present

## 2015-09-18 DIAGNOSIS — N2581 Secondary hyperparathyroidism of renal origin: Secondary | ICD-10-CM | POA: Diagnosis not present

## 2015-09-21 DIAGNOSIS — D509 Iron deficiency anemia, unspecified: Secondary | ICD-10-CM | POA: Diagnosis not present

## 2015-09-21 DIAGNOSIS — N186 End stage renal disease: Secondary | ICD-10-CM | POA: Diagnosis not present

## 2015-09-21 DIAGNOSIS — N2581 Secondary hyperparathyroidism of renal origin: Secondary | ICD-10-CM | POA: Diagnosis not present

## 2015-09-23 DIAGNOSIS — Z992 Dependence on renal dialysis: Secondary | ICD-10-CM | POA: Diagnosis not present

## 2015-09-23 DIAGNOSIS — I120 Hypertensive chronic kidney disease with stage 5 chronic kidney disease or end stage renal disease: Secondary | ICD-10-CM | POA: Diagnosis not present

## 2015-09-23 DIAGNOSIS — D509 Iron deficiency anemia, unspecified: Secondary | ICD-10-CM | POA: Diagnosis not present

## 2015-09-23 DIAGNOSIS — N2581 Secondary hyperparathyroidism of renal origin: Secondary | ICD-10-CM | POA: Diagnosis not present

## 2015-09-23 DIAGNOSIS — N186 End stage renal disease: Secondary | ICD-10-CM | POA: Diagnosis not present

## 2015-09-25 DIAGNOSIS — N186 End stage renal disease: Secondary | ICD-10-CM | POA: Diagnosis not present

## 2015-09-25 DIAGNOSIS — D509 Iron deficiency anemia, unspecified: Secondary | ICD-10-CM | POA: Diagnosis not present

## 2015-09-25 DIAGNOSIS — D631 Anemia in chronic kidney disease: Secondary | ICD-10-CM | POA: Diagnosis not present

## 2015-09-25 DIAGNOSIS — N2581 Secondary hyperparathyroidism of renal origin: Secondary | ICD-10-CM | POA: Diagnosis not present

## 2015-09-28 DIAGNOSIS — D509 Iron deficiency anemia, unspecified: Secondary | ICD-10-CM | POA: Diagnosis not present

## 2015-09-28 DIAGNOSIS — Z8614 Personal history of Methicillin resistant Staphylococcus aureus infection: Secondary | ICD-10-CM | POA: Diagnosis not present

## 2015-09-28 DIAGNOSIS — D631 Anemia in chronic kidney disease: Secondary | ICD-10-CM | POA: Diagnosis not present

## 2015-09-28 DIAGNOSIS — N2581 Secondary hyperparathyroidism of renal origin: Secondary | ICD-10-CM | POA: Diagnosis not present

## 2015-09-28 DIAGNOSIS — N186 End stage renal disease: Secondary | ICD-10-CM | POA: Diagnosis not present

## 2015-09-30 DIAGNOSIS — D631 Anemia in chronic kidney disease: Secondary | ICD-10-CM | POA: Diagnosis not present

## 2015-09-30 DIAGNOSIS — D509 Iron deficiency anemia, unspecified: Secondary | ICD-10-CM | POA: Diagnosis not present

## 2015-09-30 DIAGNOSIS — N186 End stage renal disease: Secondary | ICD-10-CM | POA: Diagnosis not present

## 2015-09-30 DIAGNOSIS — N2581 Secondary hyperparathyroidism of renal origin: Secondary | ICD-10-CM | POA: Diagnosis not present

## 2015-10-02 DIAGNOSIS — N186 End stage renal disease: Secondary | ICD-10-CM | POA: Diagnosis not present

## 2015-10-02 DIAGNOSIS — D509 Iron deficiency anemia, unspecified: Secondary | ICD-10-CM | POA: Diagnosis not present

## 2015-10-02 DIAGNOSIS — D631 Anemia in chronic kidney disease: Secondary | ICD-10-CM | POA: Diagnosis not present

## 2015-10-02 DIAGNOSIS — N2581 Secondary hyperparathyroidism of renal origin: Secondary | ICD-10-CM | POA: Diagnosis not present

## 2015-10-05 DIAGNOSIS — N186 End stage renal disease: Secondary | ICD-10-CM | POA: Diagnosis not present

## 2015-10-05 DIAGNOSIS — N2581 Secondary hyperparathyroidism of renal origin: Secondary | ICD-10-CM | POA: Diagnosis not present

## 2015-10-05 DIAGNOSIS — D509 Iron deficiency anemia, unspecified: Secondary | ICD-10-CM | POA: Diagnosis not present

## 2015-10-05 DIAGNOSIS — D631 Anemia in chronic kidney disease: Secondary | ICD-10-CM | POA: Diagnosis not present

## 2015-10-07 DIAGNOSIS — D631 Anemia in chronic kidney disease: Secondary | ICD-10-CM | POA: Diagnosis not present

## 2015-10-07 DIAGNOSIS — N2581 Secondary hyperparathyroidism of renal origin: Secondary | ICD-10-CM | POA: Diagnosis not present

## 2015-10-07 DIAGNOSIS — D509 Iron deficiency anemia, unspecified: Secondary | ICD-10-CM | POA: Diagnosis not present

## 2015-10-07 DIAGNOSIS — N186 End stage renal disease: Secondary | ICD-10-CM | POA: Diagnosis not present

## 2015-10-08 DIAGNOSIS — S76112D Strain of left quadriceps muscle, fascia and tendon, subsequent encounter: Secondary | ICD-10-CM | POA: Diagnosis not present

## 2015-10-09 DIAGNOSIS — N2581 Secondary hyperparathyroidism of renal origin: Secondary | ICD-10-CM | POA: Diagnosis not present

## 2015-10-09 DIAGNOSIS — D631 Anemia in chronic kidney disease: Secondary | ICD-10-CM | POA: Diagnosis not present

## 2015-10-09 DIAGNOSIS — D509 Iron deficiency anemia, unspecified: Secondary | ICD-10-CM | POA: Diagnosis not present

## 2015-10-09 DIAGNOSIS — N186 End stage renal disease: Secondary | ICD-10-CM | POA: Diagnosis not present

## 2015-10-12 DIAGNOSIS — D509 Iron deficiency anemia, unspecified: Secondary | ICD-10-CM | POA: Diagnosis not present

## 2015-10-12 DIAGNOSIS — N2581 Secondary hyperparathyroidism of renal origin: Secondary | ICD-10-CM | POA: Diagnosis not present

## 2015-10-12 DIAGNOSIS — N186 End stage renal disease: Secondary | ICD-10-CM | POA: Diagnosis not present

## 2015-10-12 DIAGNOSIS — D631 Anemia in chronic kidney disease: Secondary | ICD-10-CM | POA: Diagnosis not present

## 2015-10-14 DIAGNOSIS — N186 End stage renal disease: Secondary | ICD-10-CM | POA: Diagnosis not present

## 2015-10-14 DIAGNOSIS — N2581 Secondary hyperparathyroidism of renal origin: Secondary | ICD-10-CM | POA: Diagnosis not present

## 2015-10-14 DIAGNOSIS — D509 Iron deficiency anemia, unspecified: Secondary | ICD-10-CM | POA: Diagnosis not present

## 2015-10-14 DIAGNOSIS — D631 Anemia in chronic kidney disease: Secondary | ICD-10-CM | POA: Diagnosis not present

## 2015-10-16 DIAGNOSIS — D509 Iron deficiency anemia, unspecified: Secondary | ICD-10-CM | POA: Diagnosis not present

## 2015-10-16 DIAGNOSIS — D631 Anemia in chronic kidney disease: Secondary | ICD-10-CM | POA: Diagnosis not present

## 2015-10-16 DIAGNOSIS — N2581 Secondary hyperparathyroidism of renal origin: Secondary | ICD-10-CM | POA: Diagnosis not present

## 2015-10-16 DIAGNOSIS — N186 End stage renal disease: Secondary | ICD-10-CM | POA: Diagnosis not present

## 2015-10-19 DIAGNOSIS — D509 Iron deficiency anemia, unspecified: Secondary | ICD-10-CM | POA: Diagnosis not present

## 2015-10-19 DIAGNOSIS — D631 Anemia in chronic kidney disease: Secondary | ICD-10-CM | POA: Diagnosis not present

## 2015-10-19 DIAGNOSIS — N2581 Secondary hyperparathyroidism of renal origin: Secondary | ICD-10-CM | POA: Diagnosis not present

## 2015-10-19 DIAGNOSIS — N186 End stage renal disease: Secondary | ICD-10-CM | POA: Diagnosis not present

## 2015-10-21 DIAGNOSIS — D509 Iron deficiency anemia, unspecified: Secondary | ICD-10-CM | POA: Diagnosis not present

## 2015-10-21 DIAGNOSIS — D631 Anemia in chronic kidney disease: Secondary | ICD-10-CM | POA: Diagnosis not present

## 2015-10-21 DIAGNOSIS — N186 End stage renal disease: Secondary | ICD-10-CM | POA: Diagnosis not present

## 2015-10-21 DIAGNOSIS — N2581 Secondary hyperparathyroidism of renal origin: Secondary | ICD-10-CM | POA: Diagnosis not present

## 2015-10-23 DIAGNOSIS — N2581 Secondary hyperparathyroidism of renal origin: Secondary | ICD-10-CM | POA: Diagnosis not present

## 2015-10-23 DIAGNOSIS — D509 Iron deficiency anemia, unspecified: Secondary | ICD-10-CM | POA: Diagnosis not present

## 2015-10-23 DIAGNOSIS — N186 End stage renal disease: Secondary | ICD-10-CM | POA: Diagnosis not present

## 2015-10-23 DIAGNOSIS — D631 Anemia in chronic kidney disease: Secondary | ICD-10-CM | POA: Diagnosis not present

## 2015-10-24 DIAGNOSIS — I120 Hypertensive chronic kidney disease with stage 5 chronic kidney disease or end stage renal disease: Secondary | ICD-10-CM | POA: Diagnosis not present

## 2015-10-24 DIAGNOSIS — Z992 Dependence on renal dialysis: Secondary | ICD-10-CM | POA: Diagnosis not present

## 2015-10-24 DIAGNOSIS — N186 End stage renal disease: Secondary | ICD-10-CM | POA: Diagnosis not present

## 2015-10-26 DIAGNOSIS — D509 Iron deficiency anemia, unspecified: Secondary | ICD-10-CM | POA: Diagnosis not present

## 2015-10-26 DIAGNOSIS — N2581 Secondary hyperparathyroidism of renal origin: Secondary | ICD-10-CM | POA: Diagnosis not present

## 2015-10-26 DIAGNOSIS — D631 Anemia in chronic kidney disease: Secondary | ICD-10-CM | POA: Diagnosis not present

## 2015-10-26 DIAGNOSIS — N186 End stage renal disease: Secondary | ICD-10-CM | POA: Diagnosis not present

## 2015-10-28 DIAGNOSIS — D631 Anemia in chronic kidney disease: Secondary | ICD-10-CM | POA: Diagnosis not present

## 2015-10-28 DIAGNOSIS — D509 Iron deficiency anemia, unspecified: Secondary | ICD-10-CM | POA: Diagnosis not present

## 2015-10-28 DIAGNOSIS — N186 End stage renal disease: Secondary | ICD-10-CM | POA: Diagnosis not present

## 2015-10-28 DIAGNOSIS — N2581 Secondary hyperparathyroidism of renal origin: Secondary | ICD-10-CM | POA: Diagnosis not present

## 2015-10-30 DIAGNOSIS — D509 Iron deficiency anemia, unspecified: Secondary | ICD-10-CM | POA: Diagnosis not present

## 2015-10-30 DIAGNOSIS — D631 Anemia in chronic kidney disease: Secondary | ICD-10-CM | POA: Diagnosis not present

## 2015-10-30 DIAGNOSIS — N2581 Secondary hyperparathyroidism of renal origin: Secondary | ICD-10-CM | POA: Diagnosis not present

## 2015-10-30 DIAGNOSIS — N186 End stage renal disease: Secondary | ICD-10-CM | POA: Diagnosis not present

## 2015-11-02 DIAGNOSIS — D509 Iron deficiency anemia, unspecified: Secondary | ICD-10-CM | POA: Diagnosis not present

## 2015-11-02 DIAGNOSIS — N186 End stage renal disease: Secondary | ICD-10-CM | POA: Diagnosis not present

## 2015-11-02 DIAGNOSIS — N2581 Secondary hyperparathyroidism of renal origin: Secondary | ICD-10-CM | POA: Diagnosis not present

## 2015-11-02 DIAGNOSIS — D631 Anemia in chronic kidney disease: Secondary | ICD-10-CM | POA: Diagnosis not present

## 2015-11-04 DIAGNOSIS — N2581 Secondary hyperparathyroidism of renal origin: Secondary | ICD-10-CM | POA: Diagnosis not present

## 2015-11-04 DIAGNOSIS — D509 Iron deficiency anemia, unspecified: Secondary | ICD-10-CM | POA: Diagnosis not present

## 2015-11-04 DIAGNOSIS — N186 End stage renal disease: Secondary | ICD-10-CM | POA: Diagnosis not present

## 2015-11-04 DIAGNOSIS — D631 Anemia in chronic kidney disease: Secondary | ICD-10-CM | POA: Diagnosis not present

## 2015-11-06 DIAGNOSIS — D509 Iron deficiency anemia, unspecified: Secondary | ICD-10-CM | POA: Diagnosis not present

## 2015-11-06 DIAGNOSIS — N2581 Secondary hyperparathyroidism of renal origin: Secondary | ICD-10-CM | POA: Diagnosis not present

## 2015-11-06 DIAGNOSIS — N186 End stage renal disease: Secondary | ICD-10-CM | POA: Diagnosis not present

## 2015-11-06 DIAGNOSIS — D631 Anemia in chronic kidney disease: Secondary | ICD-10-CM | POA: Diagnosis not present

## 2015-11-09 DIAGNOSIS — N186 End stage renal disease: Secondary | ICD-10-CM | POA: Diagnosis not present

## 2015-11-09 DIAGNOSIS — N2581 Secondary hyperparathyroidism of renal origin: Secondary | ICD-10-CM | POA: Diagnosis not present

## 2015-11-09 DIAGNOSIS — D631 Anemia in chronic kidney disease: Secondary | ICD-10-CM | POA: Diagnosis not present

## 2015-11-09 DIAGNOSIS — D509 Iron deficiency anemia, unspecified: Secondary | ICD-10-CM | POA: Diagnosis not present

## 2015-11-11 DIAGNOSIS — N2581 Secondary hyperparathyroidism of renal origin: Secondary | ICD-10-CM | POA: Diagnosis not present

## 2015-11-11 DIAGNOSIS — D509 Iron deficiency anemia, unspecified: Secondary | ICD-10-CM | POA: Diagnosis not present

## 2015-11-11 DIAGNOSIS — D631 Anemia in chronic kidney disease: Secondary | ICD-10-CM | POA: Diagnosis not present

## 2015-11-11 DIAGNOSIS — N186 End stage renal disease: Secondary | ICD-10-CM | POA: Diagnosis not present

## 2015-11-13 DIAGNOSIS — D631 Anemia in chronic kidney disease: Secondary | ICD-10-CM | POA: Diagnosis not present

## 2015-11-13 DIAGNOSIS — N2581 Secondary hyperparathyroidism of renal origin: Secondary | ICD-10-CM | POA: Diagnosis not present

## 2015-11-13 DIAGNOSIS — D509 Iron deficiency anemia, unspecified: Secondary | ICD-10-CM | POA: Diagnosis not present

## 2015-11-13 DIAGNOSIS — N186 End stage renal disease: Secondary | ICD-10-CM | POA: Diagnosis not present

## 2015-11-16 DIAGNOSIS — D631 Anemia in chronic kidney disease: Secondary | ICD-10-CM | POA: Diagnosis not present

## 2015-11-16 DIAGNOSIS — D509 Iron deficiency anemia, unspecified: Secondary | ICD-10-CM | POA: Diagnosis not present

## 2015-11-16 DIAGNOSIS — N186 End stage renal disease: Secondary | ICD-10-CM | POA: Diagnosis not present

## 2015-11-16 DIAGNOSIS — N2581 Secondary hyperparathyroidism of renal origin: Secondary | ICD-10-CM | POA: Diagnosis not present

## 2015-11-18 DIAGNOSIS — D509 Iron deficiency anemia, unspecified: Secondary | ICD-10-CM | POA: Diagnosis not present

## 2015-11-18 DIAGNOSIS — N186 End stage renal disease: Secondary | ICD-10-CM | POA: Diagnosis not present

## 2015-11-18 DIAGNOSIS — N2581 Secondary hyperparathyroidism of renal origin: Secondary | ICD-10-CM | POA: Diagnosis not present

## 2015-11-18 DIAGNOSIS — D631 Anemia in chronic kidney disease: Secondary | ICD-10-CM | POA: Diagnosis not present

## 2015-11-20 DIAGNOSIS — D509 Iron deficiency anemia, unspecified: Secondary | ICD-10-CM | POA: Diagnosis not present

## 2015-11-20 DIAGNOSIS — D631 Anemia in chronic kidney disease: Secondary | ICD-10-CM | POA: Diagnosis not present

## 2015-11-20 DIAGNOSIS — N186 End stage renal disease: Secondary | ICD-10-CM | POA: Diagnosis not present

## 2015-11-20 DIAGNOSIS — N2581 Secondary hyperparathyroidism of renal origin: Secondary | ICD-10-CM | POA: Diagnosis not present

## 2015-11-23 DIAGNOSIS — D631 Anemia in chronic kidney disease: Secondary | ICD-10-CM | POA: Diagnosis not present

## 2015-11-23 DIAGNOSIS — N2581 Secondary hyperparathyroidism of renal origin: Secondary | ICD-10-CM | POA: Diagnosis not present

## 2015-11-23 DIAGNOSIS — N186 End stage renal disease: Secondary | ICD-10-CM | POA: Diagnosis not present

## 2015-11-23 DIAGNOSIS — D509 Iron deficiency anemia, unspecified: Secondary | ICD-10-CM | POA: Diagnosis not present

## 2015-11-24 DIAGNOSIS — I120 Hypertensive chronic kidney disease with stage 5 chronic kidney disease or end stage renal disease: Secondary | ICD-10-CM | POA: Diagnosis not present

## 2015-11-24 DIAGNOSIS — Z992 Dependence on renal dialysis: Secondary | ICD-10-CM | POA: Diagnosis not present

## 2015-11-24 DIAGNOSIS — N186 End stage renal disease: Secondary | ICD-10-CM | POA: Diagnosis not present

## 2015-11-25 DIAGNOSIS — N2581 Secondary hyperparathyroidism of renal origin: Secondary | ICD-10-CM | POA: Diagnosis not present

## 2015-11-25 DIAGNOSIS — D631 Anemia in chronic kidney disease: Secondary | ICD-10-CM | POA: Diagnosis not present

## 2015-11-25 DIAGNOSIS — D509 Iron deficiency anemia, unspecified: Secondary | ICD-10-CM | POA: Diagnosis not present

## 2015-11-25 DIAGNOSIS — N186 End stage renal disease: Secondary | ICD-10-CM | POA: Diagnosis not present

## 2015-11-27 DIAGNOSIS — N186 End stage renal disease: Secondary | ICD-10-CM | POA: Diagnosis not present

## 2015-11-27 DIAGNOSIS — N2581 Secondary hyperparathyroidism of renal origin: Secondary | ICD-10-CM | POA: Diagnosis not present

## 2015-11-27 DIAGNOSIS — D631 Anemia in chronic kidney disease: Secondary | ICD-10-CM | POA: Diagnosis not present

## 2015-11-27 DIAGNOSIS — D509 Iron deficiency anemia, unspecified: Secondary | ICD-10-CM | POA: Diagnosis not present

## 2015-11-30 DIAGNOSIS — N186 End stage renal disease: Secondary | ICD-10-CM | POA: Diagnosis not present

## 2015-11-30 DIAGNOSIS — D631 Anemia in chronic kidney disease: Secondary | ICD-10-CM | POA: Diagnosis not present

## 2015-11-30 DIAGNOSIS — N2581 Secondary hyperparathyroidism of renal origin: Secondary | ICD-10-CM | POA: Diagnosis not present

## 2015-11-30 DIAGNOSIS — D509 Iron deficiency anemia, unspecified: Secondary | ICD-10-CM | POA: Diagnosis not present

## 2015-12-02 DIAGNOSIS — N186 End stage renal disease: Secondary | ICD-10-CM | POA: Diagnosis not present

## 2015-12-02 DIAGNOSIS — D631 Anemia in chronic kidney disease: Secondary | ICD-10-CM | POA: Diagnosis not present

## 2015-12-02 DIAGNOSIS — N2581 Secondary hyperparathyroidism of renal origin: Secondary | ICD-10-CM | POA: Diagnosis not present

## 2015-12-02 DIAGNOSIS — D509 Iron deficiency anemia, unspecified: Secondary | ICD-10-CM | POA: Diagnosis not present

## 2015-12-04 DIAGNOSIS — D509 Iron deficiency anemia, unspecified: Secondary | ICD-10-CM | POA: Diagnosis not present

## 2015-12-04 DIAGNOSIS — D631 Anemia in chronic kidney disease: Secondary | ICD-10-CM | POA: Diagnosis not present

## 2015-12-04 DIAGNOSIS — N2581 Secondary hyperparathyroidism of renal origin: Secondary | ICD-10-CM | POA: Diagnosis not present

## 2015-12-04 DIAGNOSIS — N186 End stage renal disease: Secondary | ICD-10-CM | POA: Diagnosis not present

## 2015-12-07 DIAGNOSIS — N186 End stage renal disease: Secondary | ICD-10-CM | POA: Diagnosis not present

## 2015-12-07 DIAGNOSIS — D509 Iron deficiency anemia, unspecified: Secondary | ICD-10-CM | POA: Diagnosis not present

## 2015-12-07 DIAGNOSIS — D631 Anemia in chronic kidney disease: Secondary | ICD-10-CM | POA: Diagnosis not present

## 2015-12-07 DIAGNOSIS — N2581 Secondary hyperparathyroidism of renal origin: Secondary | ICD-10-CM | POA: Diagnosis not present

## 2015-12-09 DIAGNOSIS — D631 Anemia in chronic kidney disease: Secondary | ICD-10-CM | POA: Diagnosis not present

## 2015-12-09 DIAGNOSIS — N2581 Secondary hyperparathyroidism of renal origin: Secondary | ICD-10-CM | POA: Diagnosis not present

## 2015-12-09 DIAGNOSIS — D509 Iron deficiency anemia, unspecified: Secondary | ICD-10-CM | POA: Diagnosis not present

## 2015-12-09 DIAGNOSIS — N186 End stage renal disease: Secondary | ICD-10-CM | POA: Diagnosis not present

## 2015-12-10 DIAGNOSIS — S76112D Strain of left quadriceps muscle, fascia and tendon, subsequent encounter: Secondary | ICD-10-CM | POA: Diagnosis not present

## 2015-12-11 DIAGNOSIS — D509 Iron deficiency anemia, unspecified: Secondary | ICD-10-CM | POA: Diagnosis not present

## 2015-12-11 DIAGNOSIS — N2581 Secondary hyperparathyroidism of renal origin: Secondary | ICD-10-CM | POA: Diagnosis not present

## 2015-12-11 DIAGNOSIS — N186 End stage renal disease: Secondary | ICD-10-CM | POA: Diagnosis not present

## 2015-12-11 DIAGNOSIS — D631 Anemia in chronic kidney disease: Secondary | ICD-10-CM | POA: Diagnosis not present

## 2015-12-14 DIAGNOSIS — D631 Anemia in chronic kidney disease: Secondary | ICD-10-CM | POA: Diagnosis not present

## 2015-12-14 DIAGNOSIS — N186 End stage renal disease: Secondary | ICD-10-CM | POA: Diagnosis not present

## 2015-12-14 DIAGNOSIS — N2581 Secondary hyperparathyroidism of renal origin: Secondary | ICD-10-CM | POA: Diagnosis not present

## 2015-12-14 DIAGNOSIS — D509 Iron deficiency anemia, unspecified: Secondary | ICD-10-CM | POA: Diagnosis not present

## 2015-12-16 DIAGNOSIS — N2581 Secondary hyperparathyroidism of renal origin: Secondary | ICD-10-CM | POA: Diagnosis not present

## 2015-12-16 DIAGNOSIS — D631 Anemia in chronic kidney disease: Secondary | ICD-10-CM | POA: Diagnosis not present

## 2015-12-16 DIAGNOSIS — N186 End stage renal disease: Secondary | ICD-10-CM | POA: Diagnosis not present

## 2015-12-16 DIAGNOSIS — D509 Iron deficiency anemia, unspecified: Secondary | ICD-10-CM | POA: Diagnosis not present

## 2015-12-18 DIAGNOSIS — D509 Iron deficiency anemia, unspecified: Secondary | ICD-10-CM | POA: Diagnosis not present

## 2015-12-18 DIAGNOSIS — N2581 Secondary hyperparathyroidism of renal origin: Secondary | ICD-10-CM | POA: Diagnosis not present

## 2015-12-18 DIAGNOSIS — N186 End stage renal disease: Secondary | ICD-10-CM | POA: Diagnosis not present

## 2015-12-18 DIAGNOSIS — D631 Anemia in chronic kidney disease: Secondary | ICD-10-CM | POA: Diagnosis not present

## 2015-12-21 DIAGNOSIS — N186 End stage renal disease: Secondary | ICD-10-CM | POA: Diagnosis not present

## 2015-12-21 DIAGNOSIS — N2581 Secondary hyperparathyroidism of renal origin: Secondary | ICD-10-CM | POA: Diagnosis not present

## 2015-12-21 DIAGNOSIS — D509 Iron deficiency anemia, unspecified: Secondary | ICD-10-CM | POA: Diagnosis not present

## 2015-12-21 DIAGNOSIS — D631 Anemia in chronic kidney disease: Secondary | ICD-10-CM | POA: Diagnosis not present

## 2015-12-22 DIAGNOSIS — N186 End stage renal disease: Secondary | ICD-10-CM | POA: Diagnosis not present

## 2015-12-22 DIAGNOSIS — Z992 Dependence on renal dialysis: Secondary | ICD-10-CM | POA: Diagnosis not present

## 2015-12-22 DIAGNOSIS — I120 Hypertensive chronic kidney disease with stage 5 chronic kidney disease or end stage renal disease: Secondary | ICD-10-CM | POA: Diagnosis not present

## 2015-12-23 DIAGNOSIS — N2581 Secondary hyperparathyroidism of renal origin: Secondary | ICD-10-CM | POA: Diagnosis not present

## 2015-12-23 DIAGNOSIS — N186 End stage renal disease: Secondary | ICD-10-CM | POA: Diagnosis not present

## 2015-12-23 DIAGNOSIS — D509 Iron deficiency anemia, unspecified: Secondary | ICD-10-CM | POA: Diagnosis not present

## 2015-12-25 DIAGNOSIS — D509 Iron deficiency anemia, unspecified: Secondary | ICD-10-CM | POA: Diagnosis not present

## 2015-12-25 DIAGNOSIS — N2581 Secondary hyperparathyroidism of renal origin: Secondary | ICD-10-CM | POA: Diagnosis not present

## 2015-12-25 DIAGNOSIS — N186 End stage renal disease: Secondary | ICD-10-CM | POA: Diagnosis not present

## 2015-12-28 DIAGNOSIS — D509 Iron deficiency anemia, unspecified: Secondary | ICD-10-CM | POA: Diagnosis not present

## 2015-12-28 DIAGNOSIS — N186 End stage renal disease: Secondary | ICD-10-CM | POA: Diagnosis not present

## 2015-12-28 DIAGNOSIS — N2581 Secondary hyperparathyroidism of renal origin: Secondary | ICD-10-CM | POA: Diagnosis not present

## 2015-12-30 DIAGNOSIS — N186 End stage renal disease: Secondary | ICD-10-CM | POA: Diagnosis not present

## 2015-12-30 DIAGNOSIS — D509 Iron deficiency anemia, unspecified: Secondary | ICD-10-CM | POA: Diagnosis not present

## 2015-12-30 DIAGNOSIS — N2581 Secondary hyperparathyroidism of renal origin: Secondary | ICD-10-CM | POA: Diagnosis not present

## 2015-12-31 DIAGNOSIS — S76112D Strain of left quadriceps muscle, fascia and tendon, subsequent encounter: Secondary | ICD-10-CM | POA: Diagnosis not present

## 2016-01-01 DIAGNOSIS — N2581 Secondary hyperparathyroidism of renal origin: Secondary | ICD-10-CM | POA: Diagnosis not present

## 2016-01-01 DIAGNOSIS — N186 End stage renal disease: Secondary | ICD-10-CM | POA: Diagnosis not present

## 2016-01-01 DIAGNOSIS — D509 Iron deficiency anemia, unspecified: Secondary | ICD-10-CM | POA: Diagnosis not present

## 2016-01-04 DIAGNOSIS — N186 End stage renal disease: Secondary | ICD-10-CM | POA: Diagnosis not present

## 2016-01-04 DIAGNOSIS — N2581 Secondary hyperparathyroidism of renal origin: Secondary | ICD-10-CM | POA: Diagnosis not present

## 2016-01-04 DIAGNOSIS — D509 Iron deficiency anemia, unspecified: Secondary | ICD-10-CM | POA: Diagnosis not present

## 2016-01-06 DIAGNOSIS — N2581 Secondary hyperparathyroidism of renal origin: Secondary | ICD-10-CM | POA: Diagnosis not present

## 2016-01-06 DIAGNOSIS — D509 Iron deficiency anemia, unspecified: Secondary | ICD-10-CM | POA: Diagnosis not present

## 2016-01-06 DIAGNOSIS — N186 End stage renal disease: Secondary | ICD-10-CM | POA: Diagnosis not present

## 2016-01-07 DIAGNOSIS — S76112D Strain of left quadriceps muscle, fascia and tendon, subsequent encounter: Secondary | ICD-10-CM | POA: Diagnosis not present

## 2016-01-08 DIAGNOSIS — N2581 Secondary hyperparathyroidism of renal origin: Secondary | ICD-10-CM | POA: Diagnosis not present

## 2016-01-08 DIAGNOSIS — N186 End stage renal disease: Secondary | ICD-10-CM | POA: Diagnosis not present

## 2016-01-08 DIAGNOSIS — D509 Iron deficiency anemia, unspecified: Secondary | ICD-10-CM | POA: Diagnosis not present

## 2016-01-11 DIAGNOSIS — D509 Iron deficiency anemia, unspecified: Secondary | ICD-10-CM | POA: Diagnosis not present

## 2016-01-11 DIAGNOSIS — N2581 Secondary hyperparathyroidism of renal origin: Secondary | ICD-10-CM | POA: Diagnosis not present

## 2016-01-11 DIAGNOSIS — N186 End stage renal disease: Secondary | ICD-10-CM | POA: Diagnosis not present

## 2016-01-13 DIAGNOSIS — D509 Iron deficiency anemia, unspecified: Secondary | ICD-10-CM | POA: Diagnosis not present

## 2016-01-13 DIAGNOSIS — N186 End stage renal disease: Secondary | ICD-10-CM | POA: Diagnosis not present

## 2016-01-13 DIAGNOSIS — N2581 Secondary hyperparathyroidism of renal origin: Secondary | ICD-10-CM | POA: Diagnosis not present

## 2016-01-14 DIAGNOSIS — S76112D Strain of left quadriceps muscle, fascia and tendon, subsequent encounter: Secondary | ICD-10-CM | POA: Diagnosis not present

## 2016-01-15 DIAGNOSIS — N186 End stage renal disease: Secondary | ICD-10-CM | POA: Diagnosis not present

## 2016-01-15 DIAGNOSIS — N2581 Secondary hyperparathyroidism of renal origin: Secondary | ICD-10-CM | POA: Diagnosis not present

## 2016-01-15 DIAGNOSIS — D509 Iron deficiency anemia, unspecified: Secondary | ICD-10-CM | POA: Diagnosis not present

## 2016-01-18 DIAGNOSIS — D509 Iron deficiency anemia, unspecified: Secondary | ICD-10-CM | POA: Diagnosis not present

## 2016-01-18 DIAGNOSIS — N2581 Secondary hyperparathyroidism of renal origin: Secondary | ICD-10-CM | POA: Diagnosis not present

## 2016-01-18 DIAGNOSIS — N186 End stage renal disease: Secondary | ICD-10-CM | POA: Diagnosis not present

## 2016-01-19 ENCOUNTER — Ambulatory Visit: Payer: Self-pay | Admitting: Surgery

## 2016-01-19 DIAGNOSIS — N2581 Secondary hyperparathyroidism of renal origin: Secondary | ICD-10-CM | POA: Diagnosis not present

## 2016-01-20 DIAGNOSIS — N2581 Secondary hyperparathyroidism of renal origin: Secondary | ICD-10-CM | POA: Diagnosis not present

## 2016-01-20 DIAGNOSIS — N186 End stage renal disease: Secondary | ICD-10-CM | POA: Diagnosis not present

## 2016-01-20 DIAGNOSIS — D509 Iron deficiency anemia, unspecified: Secondary | ICD-10-CM | POA: Diagnosis not present

## 2016-01-21 DIAGNOSIS — S76112D Strain of left quadriceps muscle, fascia and tendon, subsequent encounter: Secondary | ICD-10-CM | POA: Diagnosis not present

## 2016-01-22 DIAGNOSIS — D509 Iron deficiency anemia, unspecified: Secondary | ICD-10-CM | POA: Diagnosis not present

## 2016-01-22 DIAGNOSIS — N2581 Secondary hyperparathyroidism of renal origin: Secondary | ICD-10-CM | POA: Diagnosis not present

## 2016-01-22 DIAGNOSIS — I120 Hypertensive chronic kidney disease with stage 5 chronic kidney disease or end stage renal disease: Secondary | ICD-10-CM | POA: Diagnosis not present

## 2016-01-22 DIAGNOSIS — N186 End stage renal disease: Secondary | ICD-10-CM | POA: Diagnosis not present

## 2016-01-22 DIAGNOSIS — Z992 Dependence on renal dialysis: Secondary | ICD-10-CM | POA: Diagnosis not present

## 2016-01-25 DIAGNOSIS — N2581 Secondary hyperparathyroidism of renal origin: Secondary | ICD-10-CM | POA: Diagnosis not present

## 2016-01-25 DIAGNOSIS — N186 End stage renal disease: Secondary | ICD-10-CM | POA: Diagnosis not present

## 2016-01-25 DIAGNOSIS — D509 Iron deficiency anemia, unspecified: Secondary | ICD-10-CM | POA: Diagnosis not present

## 2016-01-26 DIAGNOSIS — S76112D Strain of left quadriceps muscle, fascia and tendon, subsequent encounter: Secondary | ICD-10-CM | POA: Diagnosis not present

## 2016-01-27 DIAGNOSIS — D509 Iron deficiency anemia, unspecified: Secondary | ICD-10-CM | POA: Diagnosis not present

## 2016-01-27 DIAGNOSIS — N186 End stage renal disease: Secondary | ICD-10-CM | POA: Diagnosis not present

## 2016-01-27 DIAGNOSIS — N2581 Secondary hyperparathyroidism of renal origin: Secondary | ICD-10-CM | POA: Diagnosis not present

## 2016-01-28 DIAGNOSIS — S76112D Strain of left quadriceps muscle, fascia and tendon, subsequent encounter: Secondary | ICD-10-CM | POA: Diagnosis not present

## 2016-01-29 DIAGNOSIS — N2581 Secondary hyperparathyroidism of renal origin: Secondary | ICD-10-CM | POA: Diagnosis not present

## 2016-01-29 DIAGNOSIS — N186 End stage renal disease: Secondary | ICD-10-CM | POA: Diagnosis not present

## 2016-01-29 DIAGNOSIS — D509 Iron deficiency anemia, unspecified: Secondary | ICD-10-CM | POA: Diagnosis not present

## 2016-02-01 DIAGNOSIS — N2581 Secondary hyperparathyroidism of renal origin: Secondary | ICD-10-CM | POA: Diagnosis not present

## 2016-02-01 DIAGNOSIS — N186 End stage renal disease: Secondary | ICD-10-CM | POA: Diagnosis not present

## 2016-02-01 DIAGNOSIS — D509 Iron deficiency anemia, unspecified: Secondary | ICD-10-CM | POA: Diagnosis not present

## 2016-02-02 DIAGNOSIS — S76112D Strain of left quadriceps muscle, fascia and tendon, subsequent encounter: Secondary | ICD-10-CM | POA: Diagnosis not present

## 2016-02-03 DIAGNOSIS — D509 Iron deficiency anemia, unspecified: Secondary | ICD-10-CM | POA: Diagnosis not present

## 2016-02-03 DIAGNOSIS — N2581 Secondary hyperparathyroidism of renal origin: Secondary | ICD-10-CM | POA: Diagnosis not present

## 2016-02-03 DIAGNOSIS — N186 End stage renal disease: Secondary | ICD-10-CM | POA: Diagnosis not present

## 2016-02-05 DIAGNOSIS — N186 End stage renal disease: Secondary | ICD-10-CM | POA: Diagnosis not present

## 2016-02-05 DIAGNOSIS — N2581 Secondary hyperparathyroidism of renal origin: Secondary | ICD-10-CM | POA: Diagnosis not present

## 2016-02-05 DIAGNOSIS — D509 Iron deficiency anemia, unspecified: Secondary | ICD-10-CM | POA: Diagnosis not present

## 2016-02-08 DIAGNOSIS — N186 End stage renal disease: Secondary | ICD-10-CM | POA: Diagnosis not present

## 2016-02-08 DIAGNOSIS — N2581 Secondary hyperparathyroidism of renal origin: Secondary | ICD-10-CM | POA: Diagnosis not present

## 2016-02-08 DIAGNOSIS — D509 Iron deficiency anemia, unspecified: Secondary | ICD-10-CM | POA: Diagnosis not present

## 2016-02-08 NOTE — Pre-Procedure Instructions (Signed)
    [name]  02/08/2016      [facility] - [place] - [address] [place] [address] Phone: [phone] Fax: [place] [address] - [place], [place] [address] [place] [address] Phone: [phone] Fax: [phone]    Your procedure is scheduled on Friday, April 21st, 2017.  Report to [facility] Admitting at 7:30 A.M.   Call this number if you have problems the morning of surgery:  [phone]   Remember:  Do not eat food or drink liquids after midnight.   Take these medicines the morning of surgery with A SIP OF WATER: Acetaminophen (Tylenol) if needed, Ranitidine (Zantac).  Stop taking: Aspirin, NSAIDS, Aleve, Naproxen, Ibuprofen, Advil, Motrin, BC's, Goody's, Fish oil, all herbal medications, and all vitamins.    Do not wear jewelry.  Do not wear lotions, powders, or colognes.    Men may shave face and neck.  Do not bring valuables to the hospital.   [facility] is not responsible for any belongings or valuables.  Contacts, dentures or bridgework may not be worn into surgery.  Leave your suitcase in the car.  After surgery it may be brought to your room.  For patients admitted to the hospital, discharge time will be determined by your treatment team.  Patients discharged the day of surgery will not be allowed to drive home.   Special instructions:  See attached.   Please read over the following fact sheets that you were given. Pain Booklet, Coughing and Deep Breathing and Surgical Site Infection Prevention

## 2016-02-09 ENCOUNTER — Encounter (HOSPITAL_COMMUNITY)
Admission: RE | Admit: 2016-02-09 | Discharge: 2016-02-09 | Disposition: A | Discharge status: Home / Self Care | Payer: Medicare Other | Source: Ambulatory Visit | Attending: Surgery | Admitting: Surgery

## 2016-02-09 ENCOUNTER — Encounter (HOSPITAL_COMMUNITY): Payer: Self-pay

## 2016-02-09 HISTORY — DX: Personal history of other diseases of the respiratory system: Z87.09

## 2016-02-09 HISTORY — DX: Sickle-cell trait: D57.3

## 2016-02-09 NOTE — Progress Notes (Signed)
PCP - Dr. Elmarie Shiley Cardiologist - denies Nephrologist - Dr. Edrick Oh    -dialysis MWF  EKG- 08/24/15 CXR - denies Echo - 2014 Stress test/Cardiac Cath - denies  Patient denies chest pain and shortness of breath at PAT appointment.

## 2016-02-10 ENCOUNTER — Encounter (HOSPITAL_COMMUNITY): Payer: Self-pay | Admitting: Surgery

## 2016-02-10 DIAGNOSIS — N186 End stage renal disease: Secondary | ICD-10-CM | POA: Diagnosis not present

## 2016-02-10 DIAGNOSIS — N2581 Secondary hyperparathyroidism of renal origin: Secondary | ICD-10-CM | POA: Diagnosis not present

## 2016-02-10 DIAGNOSIS — D509 Iron deficiency anemia, unspecified: Secondary | ICD-10-CM | POA: Diagnosis not present

## 2016-02-10 NOTE — H&P (Signed)
General Surgery [facility]  [name] DOB: September 17, 1971 Single / Language: Cleophus Molt / Race: Black or African American Male  History of Present Illness Patient words: parathyroid.  The patient is a 45 year old male who presents with a who presents with a parathyroid neoplasm.  Patient is referred by Dr. Edrick Oh and Dr. Elmarie Shiley for consultation for surgical management of secondary hyperparathyroidism due to end-stage renal disease. Patient has been on hemodialysis for approximately 12 years. Renal failure is due to hypertension. He dialyzes on Mondays Wednesdays and Fridays at the [facility]. His current access is in the left forearm. Patient has been diagnosed with secondary hyperparathyroidism. He likely has some underlying bone disease as noted by his orthopedic surgeon. Laboratory studies from February 2017 show a normal calcium of 9.0, and elevated phosphorus of 6.6, and a markedly elevated intact PTH level of 2351. She has had no prior surgery on the head or neck. He presents today to discuss total parathyroidectomy with autotransplantation for management of secondary hyperparathyroidism.   Other Problems Chronic Renal Failure Syndrome Gastroesophageal Reflux Disease Other disease, cancer, significant illness  Past Surgical History Dialysis Shunt / Fistula Knee Surgery Left.  Diagnostic Studies History Colonoscopy 5-10 years ago  Allergies No Known Drug Allergies03/28/2017  Medication History Midodrine HCl (10MG  Tablet, Oral) Active. Sensipar (30MG  Tablet, Oral) Active. Zantac (150MG  Tablet, Oral) Active. Imodium (2MG  Capsule, Oral) Active. Medications Reconciled  Social History Alcohol use Occasional alcohol use. Caffeine use Carbonated beverages, Coffee, Tea. No drug use  Family History Cerebrovascular Accident Mother. Hypertension Family Members In General. Kidney Disease Family Members In General,  Mother.  Review of Systems General Not Present- Appetite Loss, Chills, Fatigue, Fever, Night Sweats, Weight Gain and Weight Loss. Skin Present- Dryness. Not Present- Change in Wart/Mole, Hives, Jaundice, New Lesions, Non-Healing Wounds, Rash and Ulcer. HEENT Present- Wears glasses/contact lenses. Not Present- Earache, Hearing Loss, Hoarseness, Nose Bleed, Oral Ulcers, Ringing in the Ears, Seasonal Allergies, Sinus Pain, Sore Throat, Visual Disturbances and Yellow Eyes. Respiratory Present- Snoring. Not Present- Bloody sputum, Chronic Cough, Difficulty Breathing and Wheezing. Breast Not Present- Breast Mass, Breast Pain, Nipple Discharge and Skin Changes. Cardiovascular Not Present- Chest Pain, Difficulty Breathing Lying Down, Leg Cramps, Palpitations, Rapid Heart Rate, Shortness of Breath and Swelling of Extremities. Gastrointestinal Not Present- Abdominal Pain, Bloating, Bloody Stool, Change in Bowel Habits, Chronic diarrhea, Constipation, Difficulty Swallowing, Excessive gas, Gets full quickly at meals, Hemorrhoids, Indigestion, Nausea, Rectal Pain and Vomiting. Male Genitourinary Not Present- Blood in Urine, Change in Urinary Stream, Frequency, Impotence, Nocturia, Painful Urination, Urgency and Urine Leakage. Musculoskeletal Present- Muscle Weakness. Not Present- Back Pain, Joint Pain, Joint Stiffness, Muscle Pain and Swelling of Extremities. Neurological Not Present- Decreased Memory, Fainting, Headaches, Numbness, Seizures, Tingling, Tremor, Trouble walking and Weakness. Psychiatric Not Present- Anxiety, Bipolar, Change in Sleep Pattern, Depression, Fearful and Frequent crying. Endocrine Not Present- Cold Intolerance, Excessive Hunger, Hair Changes, Heat Intolerance, Hot flashes and New Diabetes. Hematology Not Present- Easy Bruising, Excessive bleeding, Gland problems, HIV and Persistent Infections.  Vitals Weight: 324.8 lb Height: 71in Body Surface Area: 2.59 m Body Mass Index: 45.3  kg/m  Temp.: 98.25F(Oral)  Pulse: 108 (Regular)  BP: 132/84 (Sitting, Left Arm, Standard)  Physical Exam  General - appears comfortable, no distress; not diaphorectic  HEENT - normocephalic; sclerae clear, gaze conjugate; mucous membranes moist, dentition good; voice normal  Neck - symmetric on extension; no palpable anterior or posterior cervical adenopathy; no palpable masses in the  thyroid bed  Chest - clear bilaterally without rhonchi, rales, or wheeze  Cor - regular rhythm with normal rate; no significant murmur  Ext - non-tender without significant edema or lymphedema  Neuro - grossly intact; no tremor   Assessment & Plan  SECONDARY HYPERPARATHYROIDISM, RENAL (N25.81)  Patient presents on referral from nephrology for evaluation for total parathyroidectomy with autotransplantation to the right forearm. Patient has secondary hyperparathyroidism due to end-stage renal disease from hypertension.  The patient and I discussed the procedure at length. We discussed the location of the surgical wounds. We discussed potential complications including the potential for recurrent laryngeal nerve injury and the potential for the condition to recur and require additional surgery. Patient understands and wishes to proceed with surgery in the near future. We discussed the hospital stay to be anticipated.  The risks and benefits of the procedure have been discussed at length with the patient. The patient understands the proposed procedure, potential alternative treatments, and the course of recovery to be expected. All of the patient's questions have been answered at this time. The patient wishes to proceed with surgery.  Earnstine Regal, MD, [facility] Office: [phone]

## 2016-02-11 ENCOUNTER — Encounter (HOSPITAL_COMMUNITY): Payer: Self-pay | Admitting: Anesthesiology

## 2016-02-11 MED ORDER — DEXTROSE 5 % IV SOLN
3.0000 g | INTRAVENOUS | Status: AC
  Administered 2016-02-12: 3 g via INTRAVENOUS
  Filled 2016-02-11: qty 3000

## 2016-02-11 NOTE — Anesthesia Preprocedure Evaluation (Addendum)
Anesthesia Evaluation  Patient identified by MRN, date of birth, ID band Patient awake    Reviewed: Allergy & Precautions, NPO status , Patient's Chart, lab work & pertinent test results  Airway Mallampati: III  TM Distance: >3 FB Neck ROM: Full    Dental no notable dental hx. (+) Teeth Intact   Pulmonary neg pulmonary ROS, Current Smoker,    Pulmonary exam normal breath sounds clear to auscultation       Cardiovascular hypertension, Normal cardiovascular exam Rhythm:Regular Rate:Normal     Neuro/Psych negative neurological ROS  negative psych ROS   GI/Hepatic Neg liver ROS, GERD  Medicated,  Endo/Other  Morbid obesitySecondary hyperparathyroidism  Renal/GU ESRF and DialysisRenal disease  negative genitourinary   Musculoskeletal negative musculoskeletal ROS (+)   Abdominal (+) + obese,   Peds  Hematology  (+) Sickle cell trait and anemia ,   Anesthesia Other Findings   Reproductive/Obstetrics                            Anesthesia Physical Anesthesia Plan  ASA: IV  Anesthesia Plan: General   Post-op Pain Management:    Induction: Intravenous and Cricoid pressure planned  Airway Management Planned: Oral ETT  Additional Equipment:   Intra-op Plan:   Post-operative Plan: Extubation in OR  Informed Consent: I have reviewed the patients History and Physical, chart, labs and discussed the procedure including the risks, benefits and alternatives for the proposed anesthesia with the patient or authorized representative who has indicated his/her understanding and acceptance.   Dental advisory given  Plan Discussed with: CRNA, Anesthesiologist and Surgeon  Anesthesia Plan Comments:         Anesthesia Quick Evaluation

## 2016-02-12 ENCOUNTER — Inpatient Hospital Stay (HOSPITAL_COMMUNITY): Payer: Medicare Other | Admitting: Anesthesiology

## 2016-02-12 ENCOUNTER — Encounter (HOSPITAL_COMMUNITY)
Admission: RE | Disposition: A | Discharge status: Home / Self Care | Payer: Self-pay | Source: Ambulatory Visit | Attending: Surgery

## 2016-02-12 ENCOUNTER — Encounter (HOSPITAL_COMMUNITY): Payer: Self-pay | Admitting: Certified Registered Nurse Anesthetist

## 2016-02-12 ENCOUNTER — Inpatient Hospital Stay (HOSPITAL_COMMUNITY)
Admission: RE | Admit: 2016-02-12 | Discharge: 2016-02-18 | DRG: 674 | Disposition: A | Discharge status: Home / Self Care | Payer: Medicare Other | Source: Ambulatory Visit | Attending: Surgery | Admitting: Surgery

## 2016-02-12 DIAGNOSIS — Z8249 Family history of ischemic heart disease and other diseases of the circulatory system: Secondary | ICD-10-CM | POA: Diagnosis not present

## 2016-02-12 DIAGNOSIS — K219 Gastro-esophageal reflux disease without esophagitis: Secondary | ICD-10-CM | POA: Diagnosis present

## 2016-02-12 DIAGNOSIS — I9589 Other hypotension: Secondary | ICD-10-CM | POA: Diagnosis present

## 2016-02-12 DIAGNOSIS — E875 Hyperkalemia: Secondary | ICD-10-CM | POA: Diagnosis present

## 2016-02-12 DIAGNOSIS — D573 Sickle-cell trait: Secondary | ICD-10-CM | POA: Diagnosis present

## 2016-02-12 DIAGNOSIS — Z992 Dependence on renal dialysis: Secondary | ICD-10-CM | POA: Diagnosis not present

## 2016-02-12 DIAGNOSIS — D649 Anemia, unspecified: Secondary | ICD-10-CM | POA: Diagnosis not present

## 2016-02-12 DIAGNOSIS — F17200 Nicotine dependence, unspecified, uncomplicated: Secondary | ICD-10-CM | POA: Diagnosis present

## 2016-02-12 DIAGNOSIS — N2581 Secondary hyperparathyroidism of renal origin: Secondary | ICD-10-CM | POA: Diagnosis present

## 2016-02-12 DIAGNOSIS — Z6841 Body Mass Index (BMI) 40.0 and over, adult: Secondary | ICD-10-CM

## 2016-02-12 DIAGNOSIS — N186 End stage renal disease: Secondary | ICD-10-CM | POA: Diagnosis present

## 2016-02-12 DIAGNOSIS — D631 Anemia in chronic kidney disease: Secondary | ICD-10-CM | POA: Diagnosis not present

## 2016-02-12 DIAGNOSIS — Z9889 Other specified postprocedural states: Secondary | ICD-10-CM | POA: Diagnosis not present

## 2016-02-12 DIAGNOSIS — I120 Hypertensive chronic kidney disease with stage 5 chronic kidney disease or end stage renal disease: Secondary | ICD-10-CM | POA: Diagnosis present

## 2016-02-12 HISTORY — PX: PARATHYROIDECTOMY: SHX19

## 2016-02-12 LAB — RENAL FUNCTION PANEL
ALBUMIN: 3.1 g/dL — AB (ref 3.5–5.0)
ANION GAP: 20 — AB (ref 5–15)
BUN: 62 mg/dL — AB (ref 6–20)
CO2: 18 mmol/L — ABNORMAL LOW (ref 22–32)
Calcium: 6.7 mg/dL — ABNORMAL LOW (ref 8.9–10.3)
Chloride: 96 mmol/L — ABNORMAL LOW (ref 101–111)
Creatinine, Ser: 13.67 mg/dL — ABNORMAL HIGH (ref 0.61–1.24)
GFR calc Af Amer: 4 mL/min — ABNORMAL LOW (ref >60)
GFR, EST NON AFRICAN AMERICAN: 4 mL/min — AB (ref >60)
GLUCOSE: 100 mg/dL — AB (ref 65–99)
PHOSPHORUS: 5.3 mg/dL — AB (ref 2.5–4.6)
SODIUM: 134 mmol/L — AB (ref 135–145)

## 2016-02-12 LAB — POCT I-STAT 4, (NA,K, GLUC, HGB,HCT)
Glucose, Bld: 84 mg/dL (ref 65–99)
HCT: 38 % — ABNORMAL LOW (ref 39.0–52.0)
HEMOGLOBIN: 12.9 g/dL — AB (ref 13.0–17.0)
POTASSIUM: 4.8 mmol/L (ref 3.5–5.1)
SODIUM: 135 mmol/L (ref 135–145)

## 2016-02-12 SURGERY — PARATHYROIDECTOMY, WITH AUTOGRAFT TRANSPLANT
Anesthesia: General

## 2016-02-12 MED ORDER — LIDOCAINE HCL (CARDIAC) 20 MG/ML IV SOLN
INTRAVENOUS | Status: AC
  Filled 2016-02-12: qty 5

## 2016-02-12 MED ORDER — GLYCOPYRROLATE 0.2 MG/ML IJ SOLN
INTRAMUSCULAR | Status: DC | PRN
  Administered 2016-02-12: .8 mg via INTRAVENOUS

## 2016-02-12 MED ORDER — MIDODRINE HCL 5 MG PO TABS
10.0000 mg | ORAL_TABLET | ORAL | Status: DC
  Filled 2016-02-12: qty 2

## 2016-02-12 MED ORDER — HYDROMORPHONE HCL 1 MG/ML IJ SOLN
INTRAMUSCULAR | Status: AC
  Filled 2016-02-12: qty 1

## 2016-02-12 MED ORDER — HYDROMORPHONE HCL 1 MG/ML IJ SOLN
1.0000 mg | INTRAMUSCULAR | Status: DC | PRN
  Filled 2016-02-12: qty 1

## 2016-02-12 MED ORDER — LIDOCAINE HCL (CARDIAC) 20 MG/ML IV SOLN
INTRAVENOUS | Status: DC | PRN
  Administered 2016-02-12: 100 mg via INTRAVENOUS

## 2016-02-12 MED ORDER — PROPOFOL 10 MG/ML IV BOLUS
INTRAVENOUS | Status: DC | PRN
  Administered 2016-02-12: 20 mg via INTRAVENOUS
  Administered 2016-02-12: 200 mg via INTRAVENOUS
  Administered 2016-02-12: 20 mg via INTRAVENOUS

## 2016-02-12 MED ORDER — FENTANYL CITRATE (PF) 250 MCG/5ML IJ SOLN
INTRAMUSCULAR | Status: AC
  Filled 2016-02-12: qty 5

## 2016-02-12 MED ORDER — CALCITRIOL 0.5 MCG PO CAPS
2.0000 ug | ORAL_CAPSULE | Freq: Every day | ORAL | Status: DC
  Administered 2016-02-12 – 2016-02-18 (×8): 2 ug via ORAL
  Filled 2016-02-12 (×7): qty 4

## 2016-02-12 MED ORDER — ONDANSETRON HCL 4 MG/2ML IJ SOLN
INTRAMUSCULAR | Status: AC
  Filled 2016-02-12: qty 2

## 2016-02-12 MED ORDER — SODIUM CHLORIDE 0.9 % IV SOLN
62.5000 mg | INTRAVENOUS | Status: DC
  Filled 2016-02-12 (×3): qty 5

## 2016-02-12 MED ORDER — PHENYLEPHRINE HCL 10 MG/ML IJ SOLN
10.0000 mg | INTRAVENOUS | Status: DC | PRN
  Administered 2016-02-12: 10 ug/min via INTRAVENOUS

## 2016-02-12 MED ORDER — MIDAZOLAM HCL 2 MG/2ML IJ SOLN
INTRAMUSCULAR | Status: AC
  Filled 2016-02-12: qty 2

## 2016-02-12 MED ORDER — FENTANYL CITRATE (PF) 100 MCG/2ML IJ SOLN
INTRAMUSCULAR | Status: DC | PRN
  Administered 2016-02-12 (×3): 50 ug via INTRAVENOUS
  Administered 2016-02-12: 150 ug via INTRAVENOUS
  Administered 2016-02-12 (×2): 50 ug via INTRAVENOUS

## 2016-02-12 MED ORDER — SODIUM CHLORIDE 0.9 % IV SOLN
INTRAVENOUS | Status: DC
  Administered 2016-02-12: 16:00:00 via INTRAVENOUS

## 2016-02-12 MED ORDER — ACETAMINOPHEN 650 MG RE SUPP: 650.0000 mg | Freq: Four times a day (QID) | RECTAL | Status: DC | PRN

## 2016-02-12 MED ORDER — SODIUM CHLORIDE 0.9 % IV SOLN
INTRAVENOUS | Status: DC
  Administered 2016-02-12: 10 mL/h via INTRAVENOUS
  Administered 2016-02-12: 14:00:00 via INTRAVENOUS

## 2016-02-12 MED ORDER — SODIUM CHLORIDE 0.9 % IV SOLN
INTRAVENOUS | Status: DC | PRN
  Administered 2016-02-12 (×2): via INTRAVENOUS

## 2016-02-12 MED ORDER — CISATRACURIUM BESYLATE (PF) 10 MG/5ML IV SOLN
INTRAVENOUS | Status: DC | PRN
  Administered 2016-02-12 (×4): 2 mg via INTRAVENOUS
  Administered 2016-02-12: 6 mg via INTRAVENOUS
  Administered 2016-02-12: 14 mg via INTRAVENOUS

## 2016-02-12 MED ORDER — OXYCODONE HCL 5 MG PO TABS: 5.0000 mg | ORAL_TABLET | ORAL | Status: DC | PRN

## 2016-02-12 MED ORDER — CALCIUM CARBONATE ANTACID 500 MG PO CHEW
600.0000 mg | CHEWABLE_TABLET | Freq: Three times a day (TID) | ORAL | Status: DC
  Administered 2016-02-12 – 2016-02-14 (×5): 600 mg via ORAL
  Filled 2016-02-12 (×5): qty 3

## 2016-02-12 MED ORDER — ROCURONIUM BROMIDE 50 MG/5ML IV SOLN
INTRAVENOUS | Status: AC
  Filled 2016-02-12: qty 1

## 2016-02-12 MED ORDER — 0.9 % SODIUM CHLORIDE (POUR BTL) OPTIME
TOPICAL | Status: DC | PRN
  Administered 2016-02-12: 1000 mL

## 2016-02-12 MED ORDER — RENA-VITE PO TABS
1.0000 | ORAL_TABLET | Freq: Every day | ORAL | Status: DC
  Administered 2016-02-12 – 2016-02-17 (×6): 1 via ORAL
  Filled 2016-02-12 (×6): qty 1

## 2016-02-12 MED ORDER — ACETAMINOPHEN 325 MG PO TABS: 650.0000 mg | ORAL_TABLET | Freq: Four times a day (QID) | ORAL | Status: DC | PRN

## 2016-02-12 MED ORDER — BUPIVACAINE HCL (PF) 0.25 % IJ SOLN: INTRAMUSCULAR | Status: DC | PRN

## 2016-02-12 MED ORDER — PROPOFOL 10 MG/ML IV BOLUS
INTRAVENOUS | Status: AC
  Filled 2016-02-12: qty 20

## 2016-02-12 MED ORDER — ONDANSETRON 4 MG PO TBDP: 4.0000 mg | ORAL_TABLET | Freq: Four times a day (QID) | ORAL | Status: DC | PRN

## 2016-02-12 MED ORDER — ONDANSETRON HCL 4 MG/2ML IJ SOLN: 4.0000 mg | Freq: Once | INTRAMUSCULAR | Status: DC | PRN

## 2016-02-12 MED ORDER — CISATRACURIUM BESYLATE 20 MG/10ML IV SOLN
INTRAVENOUS | Status: AC
  Filled 2016-02-12: qty 10

## 2016-02-12 MED ORDER — NEOSTIGMINE METHYLSULFATE 10 MG/10ML IV SOLN
INTRAVENOUS | Status: DC | PRN
  Administered 2016-02-12: 5 mg via INTRAVENOUS

## 2016-02-12 MED ORDER — NEOSTIGMINE METHYLSULFATE 10 MG/10ML IV SOLN
INTRAVENOUS | Status: AC
  Filled 2016-02-12: qty 1

## 2016-02-12 MED ORDER — GLYCOPYRROLATE 0.2 MG/ML IJ SOLN
INTRAMUSCULAR | Status: AC
  Filled 2016-02-12: qty 4

## 2016-02-12 MED ORDER — ONDANSETRON HCL 4 MG/2ML IJ SOLN: 4.0000 mg | Freq: Four times a day (QID) | INTRAMUSCULAR | Status: DC | PRN

## 2016-02-12 MED ORDER — MEPERIDINE HCL 25 MG/ML IJ SOLN: 6.2500 mg | INTRAMUSCULAR | Status: DC | PRN

## 2016-02-12 MED ORDER — HYDROMORPHONE HCL 1 MG/ML IJ SOLN
0.2500 mg | INTRAMUSCULAR | Status: DC | PRN
  Administered 2016-02-12 (×4): 0.5 mg via INTRAVENOUS

## 2016-02-12 MED ORDER — BUPIVACAINE HCL (PF) 0.25 % IJ SOLN
INTRAMUSCULAR | Status: AC
  Filled 2016-02-12: qty 30

## 2016-02-12 MED ORDER — CALCIUM CARBONATE ANTACID 500 MG PO CHEW
400.0000 mg | CHEWABLE_TABLET | Freq: Every day | ORAL | Status: DC
  Administered 2016-02-12 – 2016-02-13 (×2): 400 mg via ORAL
  Filled 2016-02-12 (×2): qty 2

## 2016-02-12 MED ORDER — MIDODRINE HCL 5 MG PO TABS
10.0000 mg | ORAL_TABLET | ORAL | Status: DC
  Administered 2016-02-13 – 2016-02-17 (×3): 10 mg via ORAL

## 2016-02-12 MED ORDER — ONDANSETRON HCL 4 MG/2ML IJ SOLN
INTRAMUSCULAR | Status: DC | PRN
  Administered 2016-02-12: 4 mg via INTRAVENOUS

## 2016-02-12 SURGICAL SUPPLY — 55 items
ATTRACTOMAT 16X20 MAGNETIC DRP (DRAPES) ×3 IMPLANT
BANDAGE ELASTIC 4 VELCRO ST LF (GAUZE/BANDAGES/DRESSINGS) IMPLANT
BANDAGE ELASTIC 6 VELCRO ST LF (GAUZE/BANDAGES/DRESSINGS) ×3 IMPLANT
BENZOIN TINCTURE PRP APPL 2/3 (GAUZE/BANDAGES/DRESSINGS) ×6 IMPLANT
BLADE SURG 10 STRL SS (BLADE) ×3 IMPLANT
BLADE SURG 15 STRL LF DISP TIS (BLADE) ×2 IMPLANT
BLADE SURG 15 STRL SS (BLADE) ×4
BLADE SURG ROTATE 9660 (MISCELLANEOUS) IMPLANT
BNDG GAUZE ELAST 4 BULKY (GAUZE/BANDAGES/DRESSINGS) ×3 IMPLANT
CANISTER SUCTION 2500CC (MISCELLANEOUS) ×3 IMPLANT
CHLORAPREP W/TINT 10.5 ML (MISCELLANEOUS) ×6 IMPLANT
CLIP TI MEDIUM 24 (CLIP) ×3 IMPLANT
CLIP TI WIDE RED SMALL 24 (CLIP) ×3 IMPLANT
CLOSURE WOUND 1/2 X4 (GAUZE/BANDAGES/DRESSINGS) ×2
CONT SPEC 4OZ CLIKSEAL STRL BL (MISCELLANEOUS) ×24 IMPLANT
COVER SURGICAL LIGHT HANDLE (MISCELLANEOUS) ×3 IMPLANT
CRADLE DONUT ADULT HEAD (MISCELLANEOUS) ×3 IMPLANT
DRAPE LAPAROTOMY T 98X78 PEDS (DRAPES) ×6 IMPLANT
DRAPE SLUSH MACHINE 52X66 (DRAPES) IMPLANT
DRAPE SLUSH/WARMER DISC (DRAPES) IMPLANT
DRAPE UTILITY XL STRL (DRAPES) ×12 IMPLANT
ELECT CAUTERY BLADE 6.4 (BLADE) ×3 IMPLANT
ELECT REM PT RETURN 9FT ADLT (ELECTROSURGICAL) ×3
ELECTRODE REM PT RTRN 9FT ADLT (ELECTROSURGICAL) ×1 IMPLANT
GAUZE SPONGE 4X4 12PLY STRL (GAUZE/BANDAGES/DRESSINGS) ×6 IMPLANT
GAUZE SPONGE 4X4 16PLY XRAY LF (GAUZE/BANDAGES/DRESSINGS) ×3 IMPLANT
GLOVE SURG ORTHO 8.0 STRL STRW (GLOVE) ×6 IMPLANT
GOWN STRL REUS W/ TWL LRG LVL3 (GOWN DISPOSABLE) ×2 IMPLANT
GOWN STRL REUS W/ TWL XL LVL3 (GOWN DISPOSABLE) ×1 IMPLANT
GOWN STRL REUS W/TWL LRG LVL3 (GOWN DISPOSABLE) ×4
GOWN STRL REUS W/TWL XL LVL3 (GOWN DISPOSABLE) ×2
HEMOSTAT SURGICEL 2X4 FIBR (HEMOSTASIS) ×3 IMPLANT
IV SODIUM CHL 0.9 SLUSH (IV SOLUTION) ×3 IMPLANT
KIT BASIN OR (CUSTOM PROCEDURE TRAY) ×3 IMPLANT
KIT ROOM TURNOVER OR (KITS) ×3 IMPLANT
LIGHT WAVEGUIDE WIDE FLAT (MISCELLANEOUS) ×3 IMPLANT
NS IRRIG 1000ML POUR BTL (IV SOLUTION) ×3 IMPLANT
PACK SURGICAL SETUP 50X90 (CUSTOM PROCEDURE TRAY) ×3 IMPLANT
PAD ARMBOARD 7.5X6 YLW CONV (MISCELLANEOUS) ×3 IMPLANT
PENCIL BUTTON HOLSTER BLD 10FT (ELECTRODE) ×3 IMPLANT
SPECIMEN JAR SMALL (MISCELLANEOUS) ×12 IMPLANT
SPONGE INTESTINAL PEANUT (DISPOSABLE) ×3 IMPLANT
STRIP CLOSURE SKIN 1/2X4 (GAUZE/BANDAGES/DRESSINGS) ×4 IMPLANT
SUT MNCRL AB 4-0 PS2 18 (SUTURE) ×6 IMPLANT
SUT PROLENE 4 0 RB 1 (SUTURE) ×2
SUT PROLENE 4-0 RB1 .5 CRCL 36 (SUTURE) ×1 IMPLANT
SUT SILK 2 0 (SUTURE) ×2
SUT SILK 2-0 18XBRD TIE 12 (SUTURE) ×1 IMPLANT
SUT VIC AB 3-0 SH 18 (SUTURE) ×9 IMPLANT
SYR BULB 3OZ (MISCELLANEOUS) ×3 IMPLANT
TOWEL OR 17X24 6PK STRL BLUE (TOWEL DISPOSABLE) ×3 IMPLANT
TOWEL OR 17X26 10 PK STRL BLUE (TOWEL DISPOSABLE) ×3 IMPLANT
TUBE CONNECTING 12'X1/4 (SUCTIONS) ×1
TUBE CONNECTING 12X1/4 (SUCTIONS) ×2 IMPLANT
UNDERPAD 30X30 INCONTINENT (UNDERPADS AND DIAPERS) ×3 IMPLANT

## 2016-02-12 NOTE — Brief Op Note (Signed)
02/12/2016  12:25 PM  PATIENT:  [name]  45 y.o. male  PRE-OPERATIVE DIAGNOSIS:  Secondary  hyperparathyroidism ESRD  POST-OPERATIVE DIAGNOSIS:  Secondary  hyperparathyroidism ESRD  PROCEDURE:  Procedure(s):  TOTAL PARATHYROIDECTOMY AUTOTRANSPLANT TO RIGHT FOREARM  (N/A)  SURGEON:  Surgeon(s) and Role:    * Armandina Gemma, MD - Primary  PHYSICIAN ASSISTANT:   ASSISTANTS: none   ANESTHESIA:   general  EBL:  Total I/O In: 900 [I.V.:900] Out: 50 [Blood:50]  BLOOD ADMINISTERED:none  DRAINS: none   LOCAL MEDICATIONS USED:  NONE  SPECIMEN:  Excision  DISPOSITION OF SPECIMEN:  PATHOLOGY  COUNTS:  YES  TOURNIQUET:  * No tourniquets in log *  DICTATION: .Other Dictation: Dictation Number [id number]  PLAN OF CARE: Admit to inpatient   PATIENT DISPOSITION:  PACU - hemodynamically stable.   Delay start of Pharmacological VTE agent (>24hrs) due to surgical blood loss or risk of bleeding: yes  Earnstine Regal, MD, [facility] Office: [phone]

## 2016-02-12 NOTE — Discharge Instructions (Signed)
[facility] ° °THYROID & PARATHYROID SURGERY:  POST-OP INSTRUCTIONS ° °Always review your discharge instruction sheet from the facility where your surgery was performed. ° °A prescription for pain medication may be given to you upon discharge.  Take your pain medication as prescribed.  If narcotic pain medicine is not needed, then you may take acetaminophen (Tylenol) or ibuprofen (Advil) as needed. ° °Take your usually prescribed medications unless otherwise directed. ° °If you need a refill on your pain medication, please contact your pharmacy. They will contact our office to request authorization.  Prescriptions will not be processed by our office after 5 pm or on weekends. ° °Start with a light diet upon arrival home, such as soup and crackers or toast.  Be sure to drink plenty of fluids daily.  Resume your normal diet the day after surgery. ° °Most patients will experience some swelling and bruising on the chest and neck area.  Ice packs will help.  Swelling and bruising can take several days to resolve.  ° °It is common to experience some constipation after surgery.  Increasing fluid intake and taking a stool softener will usually help or prevent this problem.  A mild laxative (Milk of Magnesia or Miralax) should be taken according to package directions if there has been no bowel movement after 48 hours. ° °You have steri-strips and a gauze dressing over your incision.  You may remove the gauze bandage on the second day after surgery, and you may shower at that time.  Leave your steri-strips (small skin tapes) in place directly over the incision.  These strips should remain on the skin for 5-7 days and then be removed.  You may get them wet in the shower and pat them dry. ° °You may resume regular (light) daily activities beginning the next day - such as daily self-care, walking, climbing stairs - gradually increasing activities as tolerated.  You may have sexual intercourse when it is  comfortable.  Refrain from any heavy lifting or straining until approved by your doctor.  You may drive when you no longer are taking prescription pain medication, you can comfortably wear a seatbelt, and you can safely maneuver your car and apply brakes. ° °You should see your doctor in the office for a follow-up appointment approximately two to three weeks after your surgery.  Make sure that you call for this appointment within a day or two after you arrive home to insure a convenient appointment time. ° °WHEN TO CALL YOUR DOCTOR: °-- Fever greater than 101.5 °-- Inability to urinate °-- Nausea and/or vomiting - persistent °-- Extreme swelling or bruising °-- Continued bleeding from incision °-- Increased pain, redness, or drainage from the incision °-- Difficulty swallowing or breathing °-- Muscle cramping or spasms °-- Numbness or tingling in hands or around lips ° °The clinic staff is available to answer your questions during regular business hours.  Please don’t hesitate to call and ask to speak to one of the nurses if you have concerns. ° °Kayle Passarelli M. Lalonnie Shaffer, MD, FACS °General & Endocrine Surgery °[facility] °Office: [phone] ° °Website: [email or web address] ° ° °

## 2016-02-12 NOTE — Anesthesia Procedure Notes (Signed)
Procedure Name: Intubation Date/Time: 02/12/2016 9:23 AM Performed by: Garrison Columbus T Pre-anesthesia Checklist: Patient identified, Emergency Drugs available, Suction available and Patient being monitored Patient Re-evaluated:Patient Re-evaluated prior to inductionOxygen Delivery Method: Circle System Utilized Preoxygenation: Pre-oxygenation with 100% oxygen Intubation Type: IV induction Ventilation: Mask ventilation with difficulty, Oral airway inserted - appropriate to patient size and Two handed mask ventilation required Laryngoscope Size: Miller and 2 Grade View: Grade I Tube type: Oral Tube size: 7.5 mm Number of attempts: 1 Airway Equipment and Method: Stylet and Oral airway Placement Confirmation: ETT inserted through vocal cords under direct vision,  positive ETCO2 and breath sounds checked- equal and bilateral Secured at: 23 cm Tube secured with: Tape Dental Injury: Teeth and Oropharynx as per pre-operative assessment

## 2016-02-12 NOTE — Progress Notes (Signed)
Results for [name], [name] (MRN [id number]) as of 02/12/2016 23:28  Ref. Range 02/12/2016 22:33  Potassium Latest Ref Range: 3.5-5.1 mmol/L >7.5 [facility])  Paged MD, awaiting call back

## 2016-02-12 NOTE — Anesthesia Postprocedure Evaluation (Signed)
Anesthesia Post Note  Patient: [name]  Procedure(s) Performed: Procedure(s) (LRB):  TOTAL PARATHYROIDECTOMY AUTOTRANSPLANT TO RIGHT FOREARM  (N/A)  Patient location during evaluation: PACU Anesthesia Type: General Level of consciousness: awake and alert and oriented Pain management: pain level controlled Vital Signs Assessment: post-procedure vital signs reviewed and stable Respiratory status: spontaneous breathing, nonlabored ventilation and respiratory function stable Cardiovascular status: blood pressure returned to baseline and stable Postop Assessment: no signs of nausea or vomiting Anesthetic complications: no    Last Vitals:  Filed Vitals:   02/12/16 1345 02/12/16 1400  BP: 144/66 120/66  Pulse: 94 96  Temp:    Resp: 36 32    Last Pain:  Filed Vitals:   02/12/16 1428  PainSc: 7                  Emeli Goguen A.

## 2016-02-12 NOTE — Consult Note (Signed)
[facility] Renal Consultation Note    Indication for Consultation:  Management of ESRD/hemodialysis; anemia, hypertension/volume and secondary hyperparathyroidism  HPI: [name] is a morbidly obese 45 y.o. male with ESRD on MWF () with a history of HTN now requiring midodrine for BP support who is admitted following parathyroidectomy with autotransplantation due to uncontrolled renal related secondary hyperparathyroidism.  He is compliant with HD treatments but has excessive IDWG at times. His last HD was Wednesday.  Post op labs are pending. He is admitted for electrolyte monitoring and dialysis post surgery  Past Medical History  Diagnosis Date  . Chronic kidney disease   . Hypertension   . Hemodialysis finding May 2 ,2005    First HD at [facility].  . Anemia     Iron deficiency  . End stage renal disease on dialysis ([place])   . Aneurysm ([place]) 02/15/2007    Right arm large aneurysm  . GERD (gastroesophageal reflux disease)   . Constipation   . Sickle cell trait ([place])   . History of bronchitis    Past Surgical History  Procedure Laterality Date  . Ligation of arteriovenous  fistula Right 04/22/2013    Procedure: LIGATION OF ARTERIOVENOUS  FISTULA  AND RESECTION OF VENOUS ANEURYSM AND ABSCESS;  Surgeon: Rosetta Posner, MD;  Location: [place];  Service: Vascular;  Laterality: Right;  . Insertion of dialysis catheter Right 04/22/2013    Procedure: INSERTION OF DIALYSIS CATHETER;  Surgeon: Rosetta Posner, MD;  Location: [place];  Service: Vascular;  Laterality: Right;  . Av fistula placement    . Colonscopy    . Colonoscopy      Hxl: of  . Av fistula placement Left 05/03/2013    Procedure: ARTERIOVENOUS (AV) FISTULA CREATION;  Surgeon: Rosetta Posner, MD;  Location: [place];  Service: Vascular;  Laterality: Left;  . Fistula superficialization Left 07/24/2013    Procedure: FISTULA SUPERFICIALIZATION AND LIGATION OF BRANCHES;  Surgeon: Rosetta Posner, MD;  Location: [place];  Service: Vascular;  Laterality: Left;  . Tee without cardioversion N/A 09/13/2013    Procedure: TRANSESOPHAGEAL ECHOCARDIOGRAM (TEE);  Surgeon: Sanda Klein, MD;  Location: [facility] ENDOSCOPY;  Service: Cardiovascular;  Laterality: N/A;  . Fracture surgery Left Jul 28, 2015    Petela Tendon and Fx  . Quadriceps tendon repair Left 08/24/2015    Procedure: REPAIR OF LEFT QUADRICEP TENDON;  Surgeon: Frederik Pear, MD;  Location: [facility];  Service: Orthopedics;  Laterality: Left;  . Removal of a dialysis catheter  2014   Family History  Problem Relation Age of Onset  . Hypertension Mother   . Heart disease Mother     before age 42  . Other Brother     DVT   Social History:  reports that he has been smoking Cigars.  He has never used smokeless tobacco. He reports that he drinks alcohol. He reports that he does not use illicit drugs. No Known Allergies Prior to Admission medications   Medication Sig Start Date End Date Taking? Authorizing Provider  acetaminophen (TYLENOL) 500 MG tablet Take 1,000 mg by mouth every 6 (six) hours as needed (pain/headache).    Yes Historical Provider, MD  b complex-vitamin c-folic acid (NEPHRO-VITE) 0.8 MG TABS Take 1 tablet by mouth daily.    Yes Historical Provider, MD  midodrine (PROAMATINE) 10 MG tablet Take 10 mg by mouth See admin instructions. Take 1 tablet (10 mg) by mouth prior to dialysis and 1 tablet (  10 mg) half way through dialysis - Monday, Wednesday, Friday   Yes Historical Provider, MD  ranitidine (ZANTAC) 150 MG capsule Take 150 mg by mouth daily as needed for heartburn. For heartburn   Yes Historical Provider, MD  sucroferric oxyhydroxide (VELPHORO) 500 MG chewable tablet Chew 1,000 mg by mouth 3 (three) times daily with meals.   Yes Historical Provider, MD   Current Facility-Administered Medications  Medication Dose Route Frequency Provider Last Rate Last Dose  . 0.9 %  sodium chloride infusion   Intravenous Continuous Josephine Igo, MD 20 mL/hr  at 02/12/16 1344    . 0.9 %  sodium chloride infusion   Intravenous Continuous Armandina Gemma, MD      . acetaminophen (TYLENOL) tablet 650 mg  650 mg Oral Q6H PRN Armandina Gemma, MD       Or  . acetaminophen (TYLENOL) suppository 650 mg  650 mg Rectal Q6H PRN Armandina Gemma, MD      . calcitRIOL (ROCALTROL) capsule 2 mcg  2 mcg Oral Daily Alric Seton, PA-C      . HYDROmorphone (DILAUDID) 1 MG/ML injection           . HYDROmorphone (DILAUDID) 1 MG/ML injection           . HYDROmorphone (DILAUDID) injection 1 mg  1 mg Intravenous Q2H PRN Armandina Gemma, MD      . ondansetron (ZOFRAN-ODT) disintegrating tablet 4 mg  4 mg Oral Q6H PRN Armandina Gemma, MD       Or  . ondansetron (ZOFRAN) injection 4 mg  4 mg Intravenous Q6H PRN Armandina Gemma, MD      . oxyCODONE (Oxy IR/ROXICODONE) immediate release tablet 5-10 mg  5-10 mg Oral Q4H PRN Armandina Gemma, MD       Labs: Basic Metabolic Panel:  Recent Labs Lab 02/12/16 0809  NA 135  K 4.8  GLUCOSE 84   CBC:  Recent Labs Lab 02/12/16 0809  HGB 12.9*  HCT 38.0*   ROS: As per HPI otherwise negative  Physical Exam: Filed Vitals:   02/12/16 1345 02/12/16 1400 02/12/16 1500 02/12/16 1510  BP: 144/66 120/66 121/59 115/65  Pulse: 94 96 91 88  Temp:    97.8 F (36.6 C)  TempSrc:      Resp: 36 32 31 29  Weight:      SpO2: 96% 98% 99% 99%     General: Well developed, well nourished, morbidly obese pleasant AAM Head: Normocephalic, atraumatic, sclera non-icteric, mucus membranes are moist Neck: Supple. Dressing intact; ice pack on neck Lungs: Clear bilaterally to auscultation anteriorly Breathing is unlabored. Heart: RRR with S1 S2. Abdomen:  protuberant, obese, soft NT + BS Extremities:  LE:  1+ edema  Right forearm wrapped Neuro: Alert and oriented X 3. Moves all extremities spontaneously. Psych:  Responds to questions appropriately with a normal affect. Dialysis Access:  left  Lower AVF with two somewhat denuded areas, but no over skin breakdown +  bruit  Dialysis Orders:  [facility] MWF 5.5 hr 200 Optiflux 2K 2 X 450/800 edw 143.5 PROFILE 2 left lower AVF heparin 10,000 with 4000 mid tmt,  venofer 50/Wed, no ESA, Hectorol 8 Recent labs: Hgb 12.5 iPTH 2400s P ^ Ca (s  Assessment/Plan: 1.  s/p parathyroidectomy with autotransplantation to right forearm - baseline iPTH running 2400 -2500- Ca running in the 9s at baseline had been on 8 of hectorol - check post op K/Ca/P,  start on 3.5 Ca bath tomorrow on HD pending labs results;  stop sensipar and nonCa containing binders (prev on Velphoro) - empirically start on tums 3 tid and 2 HS, daily calcitriol and hx check labs to see where we are with Ca levels- given high iPTH to start, expect Ca will drop considerably;  2.  ESRD -  MWF - dialyzes 5.5 hr on 200 Optiflux - only 180s here- needs large time due to morbid obesity; HD will be deferred until Saturday unless he has Marland Kitchen K today. Plan hold heparin - he requires a large amount so need to watch carefully for system clotting on HD 3. BP/volume  - prone to high IDWG - on midodrine for BP support; no BP meds - he can stand for weights 4. Anemia  - Hgb has been > 12 no ESA; on weekly Fe q Wed- check CBC in am 5. Morbid obesity 6. Nutrition - advance diet as able, renavite  Myriam Jacobson, PA-C [place] [phone] 02/12/2016, 3:36 PM   Pt seen, examined and agree w A/P as above. ESRD with severe sec HTPH, pth level 2400 range.  Underwent parathyroidectomy today, we will follow Ca levels closely due to risk of hungry bones syndrome.  Supplement Ca and vit D as needed.  Kelly Splinter MD [employer] pager [phone]    cell [phone] 02/12/2016, 8:32 PM

## 2016-02-12 NOTE — Transfer of Care (Signed)
Immediate Anesthesia Transfer of Care Note  Patient: [name]  Procedure(s) Performed: Procedure(s):  TOTAL PARATHYROIDECTOMY AUTOTRANSPLANT TO RIGHT FOREARM  (N/A)  Patient Location: PACU  Anesthesia Type:General  Level of Consciousness: awake, alert  and oriented  Airway & Oxygen Therapy: Patient Spontanous Breathing and Patient connected to face mask oxygen  Post-op Assessment: Report given to RN, Post -op Vital signs reviewed and stable and Patient moving all extremities X 4  Post vital signs: Reviewed and stable  Last Vitals:  Filed Vitals:   02/12/16 0717 02/12/16 1215  BP: 121/77 149/80  Pulse: 101 99  Temp: 36.6 C 36.4 C  Resp: 20 20    Complications: No apparent anesthesia complications

## 2016-02-12 NOTE — Op Note (Signed)
NAMECRISTOBAL, [name] NO.:  [id number]  MEDICAL RECORD NO.:  [id number]  LOCATION:  [facility]                         FACILITY:  [facility]  PHYSICIAN:  Earnstine Regal, MD      DATE OF BIRTH:  [birth year]  DATE OF PROCEDURE:  02/12/2016                              OPERATIVE REPORT   PREOPERATIVE DIAGNOSES:  Secondary hyperparathyroidism, end-stage renal disease.  POSTOPERATIVE DIAGNOSES:  Secondary hyperparathyroidism, end-stage renal disease.  PROCEDURE: 1. Total parathyroidectomy (4 glands). 2. Autotransplantation parathyroid tissue, right brachioradialis     muscle.  SURGEON:  Earnstine Regal, MD  ANESTHESIA:  General.  ESTIMATED BLOOD LOSS:  Minimal.  PREPARATION:  ChloraPrep.  COMPLICATIONS:  None.  INDICATIONS:  The patient is a 45 year old male, patient of Dr. Edrick Oh and Dr. Elmarie Shiley with secondary hyperparathyroidism due to end- stage renal disease.  The patient has been on hemodialysis for approximately 12 years.  He dialyzes at the [facility].  He has an access present in the left forearm.  The patient was diagnosed with secondary hyperparathyroidism with intact PTH levels greater than 2300.  The patient now comes to Surgery for parathyroidectomy with autotransplantation.  BODY OF REPORT:  Procedure was done in OR #2 at the [place]. [facility].  The patient was brought to the operating room, placed in a supine position on the operating room table.  Following administration of general anesthesia, the patient was positioned and then prepped and draped in the usual aseptic fashion.  After ascertaining that an adequate level of anesthesia had been achieved, a Kocher incision was made with a #15 blade.  Dissection was carried through subcutaneous tissues and platysma.  Hemostasis was achieved with electrocautery.  Skin flaps were elevated cephalad and caudad from the thyroid notch to the sternal notch.  A Mahorner  self-retaining retractor was placed for exposure.  Strap muscles were incised in the midline and the left thyroid lobe was exposed.  Strap muscles were reflected laterally.  Left lobe was mobilized.  Venous tributaries were divided between Ligaclips.  Dissection inferiorly revealed an enlarged parathyroid gland in the tracheoesophageal groove.  It was gently dissected out.  Vascular tributaries were divided between small Ligaclips.  Gland was excised in its entirety.  Biopsy of the gland was taken and submitted to Pathology, which confirms parathyroid tissue. Remainder of the gland was placed in iced saline on the back table.  Further dissection in the left neck reveals an enlarged parathyroid gland, very high in the left neck above the level of the superior pole of the thyroid gland.  With some difficulty, this was exposed and dissected out.  Vascular pedicles divided between small Ligaclips and the gland was excised.  Again biopsy was taken and confirms hypercellular parathyroid tissue.  The remainder of the gland was placed in iced saline on the back table.  Dry pack was placed in the left neck.  Next, we turned our attention to the right thyroid lobe.  Again strap muscles were reflected laterally and the right lobe was mobilized. Dissection inferiorly reveals an enlarged parathyroid gland lying alongside the esophagus in the inferior position.  It was gently dissected out  and vascular structures divided between small Ligaclips with the electrocautery.  The entire gland was excised.  Biopsy was taken and confirmed hypercellular parathyroid tissue.  Remainder of the gland was placed in iced saline.  Further dissection on the right reveals a moderately enlarged parathyroid gland just above the level of the inferior thyroid artery. This was gently dissected out and vascular structures divided between Ligaclips.  The entire gland was excised.  Biopsy once again confirms parathyroid  tissue.  The neck was irrigated with warm saline and fibrillar was placed throughout the operative field.  Good hemostasis was noted.  Strap muscles were reapproximated in the midline with interrupted 3-0 Vicryl sutures.  Platysma was closed with interrupted 3-0 Vicryl sutures.  Skin was closed with a running 4-0 Monocryl subcuticular suture.  Wound was washed and dried and benzoin and Steri-Strips were applied.  Sterile dressings were applied.  The patient was then repositioned with the right arm extended on an arm board.  The arm was prepped and draped in the usual aseptic fashion. After ascertaining that an adequate level of anesthesia had been maintained, an incision was made over the right brachioradialis muscle. Dissection was carried through subcutaneous tissues and skin flaps were developed circumferentially.  Weitlaner retractor was placed for exposure.  Parathyroid tissue was prepared by taking 1 mm fragments from the each of the 4 parathyroid glands, which were excised from the neck. Two fragments were taken from each gland.  Eight fragments were then implanted into the brachioradialis muscle by making an incision in the muscle fascia, creating a muscular pocket with a mosquito hemostat, inserting an 1 mm fragment of parathyroid tissue, and closing the overlying muscle fascia with interrupted 4-0 Prolene suture.  This exercise was repeated 8 times.  Good hemostasis was noted.  Subcutaneous tissues were closed with interrupted 3-0 Vicryl sutures.  Skin was closed with a running 4-0 Monocryl subcuticular suture.  Wound was washed and dried and benzoin and Steri-Strips were applied.  Sterile dressings were applied.  The patient was awakened from anesthesia and brought to the recovery room.  The patient tolerated the procedure well.   Earnstine Regal, MD, [facility] Office: [phone]   TMG/MEDQ  D:  02/12/2016  T:   02/12/2016  Job:  [id number]  cc:   Elmarie Shiley, MD Sherril Croon, M.D.

## 2016-02-12 NOTE — Progress Notes (Signed)
Admission note:  Arrival Method: Patient arrived from post-op on a stretcher accompanied by the staff. Mental Orientation:Alert and oriented x 4. Telemetry:  N/A  Assessment: See doc flow sheets. Skin: Warm, dry and intact except for two surgical incisions on the neck and the right forearm from parathyroidectomy.  Dressing in place unable to see.  Dry scab noted on the left forearm.  Assessed by two nurses Tyna Jaksch). IV: Right hand, NS infusing 94ml/hr. Pain: Given Dilaudid c/o pain 7/10. Tubes: N/A Safety Measures: Bed in low position, call light and phone within reach. Fall Prevention Safety Plan: Reviewed fall safety plan, understood and acknowledged. Admission Screening: In progress. 6700 Orientation: Patient has been oriented to the unit, staff and to the room.

## 2016-02-12 NOTE — Interval H&P Note (Signed)
History and Physical Interval Note:  02/12/2016 8:42 AM  [name]  has presented today for surgery, with the diagnosis of Secondary  hyperparathyroidism ESRD.  The various methods of treatment have been discussed with the patient and family. After consideration of risks, benefits and other options for treatment, the patient has consented to    Procedure(s):  TOTAL PARATHYROIDECTOMY AUTOTRANSPLANT TO RIGHT FOREARM  (N/A) as a surgical intervention .    The patient's history has been reviewed, patient examined, no change in status, stable for surgery.  I have reviewed the patient's chart and labs.  Questions were answered to the patient's satisfaction.    Earnstine Regal, MD, [facility] Office: [place]

## 2016-02-13 LAB — RENAL FUNCTION PANEL
ALBUMIN: 3.3 g/dL — AB (ref 3.5–5.0)
ANION GAP: 19 — AB (ref 5–15)
Albumin: 2.9 g/dL — ABNORMAL LOW (ref 3.5–5.0)
Anion gap: 16 — ABNORMAL HIGH (ref 5–15)
BUN: 52 mg/dL — ABNORMAL HIGH (ref 6–20)
BUN: 31 mg/dL — ABNORMAL HIGH (ref 6–20)
CALCIUM: 6.9 mg/dL — AB (ref 8.9–10.3)
CO2: 21 mmol/L — ABNORMAL LOW (ref 22–32)
CO2: 19 mmol/L — ABNORMAL LOW (ref 22–32)
Calcium: 6.7 mg/dL — ABNORMAL LOW (ref 8.9–10.3)
Chloride: 96 mmol/L — ABNORMAL LOW (ref 101–111)
Chloride: 97 mmol/L — ABNORMAL LOW (ref 101–111)
Creatinine, Ser: 11.62 mg/dL — ABNORMAL HIGH (ref 0.61–1.24)
Creatinine, Ser: 9.48 mg/dL — ABNORMAL HIGH (ref 0.61–1.24)
GFR calc Af Amer: 5 mL/min — ABNORMAL LOW (ref >60)
GFR calc non Af Amer: 5 mL/min — ABNORMAL LOW (ref >60)
GFR, EST AFRICAN AMERICAN: 7 mL/min — AB (ref >60)
GFR, EST NON AFRICAN AMERICAN: 6 mL/min — AB (ref >60)
GLUCOSE: 152 mg/dL — AB (ref 65–99)
Glucose, Bld: 109 mg/dL — ABNORMAL HIGH (ref 65–99)
PHOSPHORUS: 4 mg/dL (ref 2.5–4.6)
POTASSIUM: 5.2 mmol/L — AB (ref 3.5–5.1)
Phosphorus: 4.3 mg/dL (ref 2.5–4.6)
Potassium: 5 mmol/L (ref 3.5–5.1)
SODIUM: 135 mmol/L (ref 135–145)
Sodium: 133 mmol/L — ABNORMAL LOW (ref 135–145)

## 2016-02-13 LAB — CBC
HCT: 36.4 % — ABNORMAL LOW (ref 39.0–52.0)
Hemoglobin: 12.1 g/dL — ABNORMAL LOW (ref 13.0–17.0)
MCH: 28.9 pg (ref 26.0–34.0)
MCHC: 33.2 g/dL (ref 30.0–36.0)
MCV: 86.9 fL (ref 78.0–100.0)
Platelets: 154 10*3/uL (ref 150–400)
RBC: 4.19 MIL/uL — ABNORMAL LOW (ref 4.22–5.81)
RDW: 16.7 % — ABNORMAL HIGH (ref 11.5–15.5)
WBC: 9.6 10*3/uL (ref 4.0–10.5)

## 2016-02-13 MED ORDER — LIDOCAINE HCL (PF) 1 % IJ SOLN: 5.0000 mL | INTRAMUSCULAR | Status: DC | PRN

## 2016-02-13 MED ORDER — LIDOCAINE-PRILOCAINE 2.5-2.5 % EX CREA: 1.0000 "application " | TOPICAL_CREAM | CUTANEOUS | Status: DC | PRN

## 2016-02-13 MED ORDER — ALTEPLASE 2 MG IJ SOLR: 2.0000 mg | Freq: Once | INTRAMUSCULAR | Status: DC | PRN

## 2016-02-13 MED ORDER — SODIUM CHLORIDE 0.9 % IV SOLN: 100.0000 mL | INTRAVENOUS | Status: DC | PRN

## 2016-02-13 MED ORDER — HEPARIN SODIUM (PORCINE) 1000 UNIT/ML DIALYSIS: 1000.0000 [IU] | INTRAMUSCULAR | Status: DC | PRN

## 2016-02-13 MED ORDER — PENTAFLUOROPROP-TETRAFLUOROETH EX AERO: 1.0000 "application " | INHALATION_SPRAY | CUTANEOUS | Status: DC | PRN

## 2016-02-13 MED ORDER — HYDROMORPHONE HCL 1 MG/ML IJ SOLN
INTRAMUSCULAR | Status: AC
  Filled 2016-02-13: qty 1

## 2016-02-13 MED ORDER — MIDODRINE HCL 5 MG PO TABS
ORAL_TABLET | ORAL | Status: AC
  Administered 2016-02-13: 10 mg via ORAL
  Filled 2016-02-13: qty 4

## 2016-02-13 NOTE — Progress Notes (Signed)
  [facility] Progress Note   Subjective: feels fine, no paresthesia, K 6.7 this am, was the same yesterday. Alb 3.2.   Filed Vitals:   02/13/16 0613 02/13/16 0630 02/13/16 0700 02/13/16 0730  BP: 113/70 116/67 107/64 116/55  Pulse: 94 86 86 87  Temp:      TempSrc:      Resp: 18 23 18 17   Height:      Weight:      SpO2:        Inpatient medications: . calcitRIOL  2 mcg Oral Daily  . calcium carbonate  400 mg of elemental calcium Oral QHS  . calcium carbonate  600 mg of elemental calcium Oral TID WC  . [START ON 02/17/2016] ferric gluconate (FERRLECIT/NULECIT) IV  62.5 mg Intravenous Q Wed-HD  . midodrine  10 mg Oral Q Sat-HD  . [START ON 02/15/2016] midodrine  10 mg Oral Q M,W,F-HD  . multivitamin  1 tablet Oral QHS   . sodium chloride 20 mL/hr at 02/12/16 1344  . sodium chloride 10 mL/hr at 02/12/16 1545   sodium chloride, sodium chloride, acetaminophen **OR** acetaminophen, alteplase, heparin, HYDROmorphone (DILAUDID) injection, lidocaine (PF), lidocaine-prilocaine, ondansetron **OR** ondansetron (ZOFRAN) IV, oxyCODONE, pentafluoroprop-tetrafluoroeth  Exam: Alert obese, calm Neck dressing No jvd Chest clear bilat RRR no mrg Abd obese soft ntnd No LE edema L forearm AVF +bruit Neuro alert nf ox 3, sitting up on side of bed  Dialysis: [facility] MWF  5.5h  F200  143.5kg   LFA AVF Hep 10,000 then 4000 midRx Venofer 50qwk No ESA Hect 8      Assessment: 1 HTPH sp PTX 4/21- high Ca bath/ CaCO3 qid +D, bid lab 2 ESRD hd y est 3 Vol stable on exam 4 Hyperkalemia - better 5 MBD 6 Hypotens on midodrine 7 Anemia no esa, cont fe  Plan -as above   Kelly Splinter MD [facility] pager [phone]    cell [phone] 02/13/2016, 10:15 AM    Recent Labs Lab 02/12/16 0809 02/12/16 2233 02/13/16 0253  NA 135 134* 133*  K 4.8 >7.5* 5.0  CL  --  96* 96*  CO2  --  18* 21*  GLUCOSE 84 100* 109*  BUN  --  62* 52*  CREATININE  --  13.67* 11.62*   CALCIUM  --  6.7* 6.7*  PHOS  --  5.3* 4.3    Recent Labs Lab 02/12/16 2233 02/13/16 0253  ALBUMIN 3.1* 2.9*    Recent Labs Lab 02/12/16 0809 02/13/16 0253  WBC  --  9.6  HGB 12.9* 12.1*  HCT 38.0* 36.4*  MCV  --  86.9  PLT  --  154

## 2016-02-13 NOTE — Progress Notes (Signed)
[facility] Progress Note  1 Day Post-Op  Subjective: Pt doing well, a little soreness in his neck and right arm.  No N/V, sipping on ginger ale, but not hungry yet.  Just finishing dialysis, had to have weekend dialysis since his potassium was so high yesterday.  Last BM was yesterday.    Objective: Vital signs in last 24 hours: Temp:  [97.6 F (36.4 C)-98.5 F (36.9 C)] 98.4 F (36.9 C) (04/22 0225) Pulse Rate:  [82-99] 87 (04/22 0730) Resp:  [17-36] 17 (04/22 0730) BP: (105-159)/(50-80) 116/55 mmHg (04/22 0730) SpO2:  [94 %-100 %] 99 % (04/22 0119) Weight:  [315 lb 11.2 oz (143.2 kg)-328 lb 11.3 oz (149.1 kg)] 328 lb 11.3 oz (149.1 kg) (04/22 0230) Last BM Date: 02/12/16  Intake/Output from previous day: 04/21 0701 - 04/22 0700 In: 1390 [P.O.:490; I.V.:900] Out: 50 [Blood:50] Intake/Output this shift:    PE: Gen:  Alert, NAD, pleasant Card:  RRR, no M/G/R heard Pulm:  CTA, no W/R/R Abd: Soft, NT/ND, +BS Ext:  Right arm with kerlix wrap, no drainage, tissue is soft and mildly tender, ace wrap re-applied Neck:  Neck incision clean with dry dressing in place  Lab Results:   Recent Labs  02/12/16 0809 02/13/16 0253  WBC  --  9.6  HGB 12.9* 12.1*  HCT 38.0* 36.4*  PLT  --  154   BMET  Recent Labs  02/12/16 2233 02/13/16 0253  NA 134* 133*  K >7.5* 5.0  CL 96* 96*  CO2 18* 21*  GLUCOSE 100* 109*  BUN 62* 52*  CREATININE 13.67* 11.62*  CALCIUM 6.7* 6.7*   PT/INR No results for input(s): LABPROT, INR in the last 72 hours. CMP     Component Value Date/Time   NA 133* 02/13/2016 0253   K 5.0 02/13/2016 0253   CL 96* 02/13/2016 0253   CO2 21* 02/13/2016 0253   GLUCOSE 109* 02/13/2016 0253   BUN 52* 02/13/2016 0253   CREATININE 11.62* 02/13/2016 0253   CALCIUM 6.7* 02/13/2016 0253   PROT 8.3 01/25/2014 0838   ALBUMIN 2.9* 02/13/2016 0253   AST 12 01/25/2014 0838   ALT 8 01/25/2014 0838   ALKPHOS 42 01/25/2014 0838   BILITOT 0.9  01/25/2014 0838   GFRNONAA 5* 02/13/2016 0253   GFRAA 5* 02/13/2016 0253   Lipase     Component Value Date/Time   LIPASE 46 01/25/2014 0838       Studies/Results: No results found.  Anti-infectives: Anti-infectives    Start     Dose/Rate Route Frequency Ordered Stop   02/12/16 0915  ceFAZolin (ANCEF) 3 g in dextrose 5 % 50 mL IVPB     3 g 130 mL/hr over 30 Minutes Intravenous To [facility] 02/11/16 1020 02/12/16 0935       Assessment/Plan Secondary hyperparathyroidism/ESRD POD #1 s/p Total parathyroidectomy (4 gland), autotransplantation parathyroid tissue, right brachioradialis muscle - Dr. Harlow Asa -Looking well from surgical standpoint -Appreciate renals care -Right arm and neck wounds clean with dressings in place -Calcium 6.7 today, phos 4.3, potassium 5.0, monitor closely -Renal diet -Change dressings tomorrow -Encouraged oral pain meds -Disposition when renal feels he's cleared for discharge    LOS: 1 day    Nat Christen 02/13/2016, 7:38 AM Pager: [phone]  (7am - 4:30pm M-F; 7am - 11:30am Sa/Su)

## 2016-02-14 LAB — RENAL FUNCTION PANEL
ALBUMIN: 3.2 g/dL — AB (ref 3.5–5.0)
Albumin: 2.9 g/dL — ABNORMAL LOW (ref 3.5–5.0)
Anion gap: 18 — ABNORMAL HIGH (ref 5–15)
Anion gap: 17 — ABNORMAL HIGH (ref 5–15)
BUN: 36 mg/dL — AB (ref 6–20)
BUN: 42 mg/dL — AB (ref 6–20)
CALCIUM: 6.3 mg/dL — AB (ref 8.9–10.3)
CALCIUM: 6 mg/dL — AB (ref 8.9–10.3)
CHLORIDE: 97 mmol/L — AB (ref 101–111)
CO2: 21 mmol/L — ABNORMAL LOW (ref 22–32)
CO2: 21 mmol/L — AB (ref 22–32)
CREATININE: 11.66 mg/dL — AB (ref 0.61–1.24)
Chloride: 94 mmol/L — ABNORMAL LOW (ref 101–111)
Creatinine, Ser: 10.76 mg/dL — ABNORMAL HIGH (ref 0.61–1.24)
GFR calc Af Amer: 6 mL/min — ABNORMAL LOW (ref >60)
GFR calc non Af Amer: 5 mL/min — ABNORMAL LOW (ref >60)
GFR, EST AFRICAN AMERICAN: 5 mL/min — AB (ref >60)
GFR, EST NON AFRICAN AMERICAN: 5 mL/min — AB (ref >60)
GLUCOSE: 94 mg/dL (ref 65–99)
GLUCOSE: 100 mg/dL — AB (ref 65–99)
PHOSPHORUS: 3.6 mg/dL (ref 2.5–4.6)
POTASSIUM: 5 mmol/L (ref 3.5–5.1)
Phosphorus: 3.6 mg/dL (ref 2.5–4.6)
Potassium: 5.1 mmol/L (ref 3.5–5.1)
SODIUM: 133 mmol/L — AB (ref 135–145)
SODIUM: 135 mmol/L (ref 135–145)

## 2016-02-14 MED ORDER — SODIUM CHLORIDE 0.9 % IV SOLN
2.0000 g | Freq: Once | INTRAVENOUS | Status: AC
  Administered 2016-02-14: 2 g via INTRAVENOUS
  Filled 2016-02-14: qty 20

## 2016-02-14 MED ORDER — CALCIUM CARBONATE ANTACID 500 MG PO CHEW
1200.0000 mg | CHEWABLE_TABLET | Freq: Three times a day (TID) | ORAL | Status: DC
  Administered 2016-02-14: 1200 mg via ORAL
  Filled 2016-02-14: qty 6

## 2016-02-14 MED ORDER — CALCIUM CARBONATE ANTACID 500 MG PO CHEW
9.0000 | CHEWABLE_TABLET | Freq: Three times a day (TID) | ORAL | Status: DC
  Administered 2016-02-14 – 2016-02-18 (×11): 1800 mg via ORAL
  Filled 2016-02-14 (×13): qty 9

## 2016-02-14 NOTE — Progress Notes (Signed)
  [facility] Progress Note   Subjective: CA down 6.0, R hand tingling, no other c/o  Filed Vitals:   02/13/16 1706 02/13/16 2059 02/14/16 0540 02/14/16 0902  BP: 145/88 137/83 119/72 127/87  Pulse: 97 99 96 98  Temp: 98.6 F (37 C) 98.7 F (37.1 C) 98.5 F (36.9 C) 98.6 F (37 C)  TempSrc: Oral Oral Oral Oral  Resp: 17 16 19 19   Height:      Weight:      SpO2: 99% 99% 99% 100%    Inpatient medications: . calcitRIOL  2 mcg Oral Daily  . calcium carbonate  1,200 mg of elemental calcium Oral TID WC  . calcium carbonate  400 mg of elemental calcium Oral QHS  . [START ON 02/17/2016] ferric gluconate (FERRLECIT/NULECIT) IV  62.5 mg Intravenous Q Wed-HD  . midodrine  10 mg Oral Q Sat-HD  . [START ON 02/15/2016] midodrine  10 mg Oral Q M,W,F-HD  . multivitamin  1 tablet Oral QHS   . sodium chloride 20 mL/hr at 02/12/16 1344  . sodium chloride 10 mL/hr at 02/12/16 1545   acetaminophen **OR** acetaminophen, alteplase, heparin, HYDROmorphone (DILAUDID) injection, lidocaine (PF), lidocaine-prilocaine, ondansetron **OR** ondansetron (ZOFRAN) IV, oxyCODONE, pentafluoroprop-tetrafluoroeth  Exam: Alert obese, calm Neck dressing No jvd Chest clear bilat RRR no mrg Abd obese soft ntnd No LE edema L forearm AVF +bruit Neuro alert nf ox 3, sitting up on side of bed  Dialysis: [facility] MWF  5.5h  F200  143.5kg   LFA AVF Hep 10,000 then 4000 midRx Venofer 50qwk No ESA Hect 8      Assessment: 1 HTPH sp PTX 4/21- Ca level down 6.0, started at 6.7 not corrected. Plan ^ CaCO3 to 1800 mg elem Ca (nine 500 mg tabs) tid between meals. Cont rocaltrol. Check Ca in am.  2 ESRD hd yest 3 Vol stable on exam 4 Hyperkalemia - better 5 MBD 6 Hypotens on midodrine 7 Anemia no esa, cont fe  Plan - HD Marina Gravel MD [facility] pager [phone]    cell [phone] 02/14/2016, 4:30 PM    Recent Labs Lab 02/13/16 1852 02/14/16 0522 02/14/16 1446  NA 135  133* 135  K 5.2* 5.0 5.1  CL 97* 94* 97*  CO2 19* 21* 21*  GLUCOSE 152* 94 100*  BUN 31* 36* 42*  CREATININE 9.48* 10.76* 11.66*  CALCIUM 6.9* 6.3* 6.0*  PHOS 4.0 3.6 3.6    Recent Labs Lab 02/13/16 1852 02/14/16 0522 02/14/16 1446  ALBUMIN 3.3* 3.2* 2.9*    Recent Labs Lab 02/12/16 0809 02/13/16 0253  WBC  --  9.6  HGB 12.9* 12.1*  HCT 38.0* 36.4*  MCV  --  86.9  PLT  --  154

## 2016-02-14 NOTE — Progress Notes (Signed)
Patient ID: [name], male   DOB: [birth year], 45 y.o.   MRN: [id number] [facility] Progress Note  2 Days Post-Op  Subjective: Minimal soreness today. Denies perioral numbness or tingling.  No cramping in hands/feet.  Got dialyzed around 2 AM Saturday.    Objective: Vital signs in last 24 hours: Temp:  [98.5 F (36.9 C)-98.7 F (37.1 C)] 98.6 F (37 C) (04/23 0902) Pulse Rate:  [96-99] 98 (04/23 0902) Resp:  [16-19] 19 (04/23 0902) BP: (119-145)/(72-88) 127/87 mmHg (04/23 0902) SpO2:  [99 %-100 %] 100 % (04/23 0902) Last BM Date: 02/12/16  Intake/Output from previous day: 04/22 0701 - 04/23 0700 In: 720 [P.O.:720] Out: 5000  Intake/Output this shift: Total I/O In: 240 [P.O.:240] Out: 0   PE: Gen:  Alert, NAD, pleasant Pulm:  Breathing comfortably Abd: Soft, NT/ND Ext:  Right arm with kerlix wrap, no drainage, tissue is soft and mildly tender, ace wrap re-applied Neck:  Neck incision clean with dry dressing in place.  Soft, minimal swelling or tenderness.    Lab Results:   Recent Labs  02/12/16 0809 02/13/16 0253  WBC  --  9.6  HGB 12.9* 12.1*  HCT 38.0* 36.4*  PLT  --  154   BMET  Recent Labs  02/13/16 1852 02/14/16 0522  NA 135 133*  K 5.2* 5.0  CL 97* 94*  CO2 19* 21*  GLUCOSE 152* 94  BUN 31* 36*  CREATININE 9.48* 10.76*  CALCIUM 6.9* 6.3*   PT/INR No results for input(s): LABPROT, INR in the last 72 hours. CMP     Component Value Date/Time   NA 133* 02/14/2016 0522   K 5.0 02/14/2016 0522   CL 94* 02/14/2016 0522   CO2 21* 02/14/2016 0522   GLUCOSE 94 02/14/2016 0522   BUN 36* 02/14/2016 0522   CREATININE 10.76* 02/14/2016 0522   CALCIUM 6.3* 02/14/2016 0522   PROT 8.3 01/25/2014 0838   ALBUMIN 3.2* 02/14/2016 0522   AST 12 01/25/2014 0838   ALT 8 01/25/2014 0838   ALKPHOS 42 01/25/2014 0838   BILITOT 0.9 01/25/2014 0838   GFRNONAA 5* 02/14/2016 0522   GFRAA 6* 02/14/2016 0522   Lipase     Component Value  Date/Time   LIPASE 46 01/25/2014 0838       Studies/Results: No results found.  Anti-infectives: Anti-infectives    Start     Dose/Rate Route Frequency Ordered Stop   02/12/16 0915  ceFAZolin (ANCEF) 3 g in dextrose 5 % 50 mL IVPB     3 g 130 mL/hr over 30 Minutes Intravenous To [facility] 02/11/16 1020 02/12/16 0935       Assessment/Plan Secondary hyperparathyroidism/ESRD POD #2 s/p Total parathyroidectomy (4 gland), autotransplantation parathyroid tissue, right brachioradialis muscle - Dr. Harlow Asa -Looking well from surgical standpoint -Appreciate renal care -Right arm and neck wounds clean with dressings in place -Remains hypocalcemic.  Increase tums  -Renal diet -Encouraged oral pain meds -Disposition when renal feels he's cleared for discharge    LOS: 2 days    Cordaryl Decelles 02/14/2016, 10:14 AM  (7am - 4:30pm M-F; 7am - 11:30am Sa/Su)

## 2016-02-15 ENCOUNTER — Encounter (HOSPITAL_COMMUNITY): Payer: Self-pay | Admitting: Surgery

## 2016-02-15 LAB — RENAL FUNCTION PANEL
ALBUMIN: 3.2 g/dL — AB (ref 3.5–5.0)
ANION GAP: 20 — AB (ref 5–15)
ANION GAP: 16 — AB (ref 5–15)
Albumin: 3.1 g/dL — ABNORMAL LOW (ref 3.5–5.0)
BUN: 48 mg/dL — ABNORMAL HIGH (ref 6–20)
BUN: 23 mg/dL — ABNORMAL HIGH (ref 6–20)
CALCIUM: 6 mg/dL — AB (ref 8.9–10.3)
CALCIUM: 7.5 mg/dL — AB (ref 8.9–10.3)
CO2: 18 mmol/L — AB (ref 22–32)
CO2: 24 mmol/L (ref 22–32)
Chloride: 92 mmol/L — ABNORMAL LOW (ref 101–111)
Chloride: 95 mmol/L — ABNORMAL LOW (ref 101–111)
Creatinine, Ser: 13.46 mg/dL — ABNORMAL HIGH (ref 0.61–1.24)
Creatinine, Ser: 8.55 mg/dL — ABNORMAL HIGH (ref 0.61–1.24)
GFR calc Af Amer: 8 mL/min — ABNORMAL LOW (ref >60)
GFR, EST AFRICAN AMERICAN: 4 mL/min — AB (ref >60)
GFR, EST NON AFRICAN AMERICAN: 4 mL/min — AB (ref >60)
GFR, EST NON AFRICAN AMERICAN: 7 mL/min — AB (ref >60)
GLUCOSE: 112 mg/dL — AB (ref 65–99)
Glucose, Bld: 74 mg/dL (ref 65–99)
PHOSPHORUS: 3.4 mg/dL (ref 2.5–4.6)
PHOSPHORUS: 3.6 mg/dL (ref 2.5–4.6)
POTASSIUM: 4.1 mmol/L (ref 3.5–5.1)
Potassium: 4.9 mmol/L (ref 3.5–5.1)
SODIUM: 130 mmol/L — AB (ref 135–145)
SODIUM: 135 mmol/L (ref 135–145)

## 2016-02-15 MED ORDER — DOXERCALCIFEROL 4 MCG/2ML IV SOLN
INTRAVENOUS | Status: AC
  Administered 2016-02-15: 4 ug via INTRAVENOUS
  Filled 2016-02-15: qty 2

## 2016-02-15 MED ORDER — CALCITRIOL 0.5 MCG PO CAPS
ORAL_CAPSULE | ORAL | Status: AC
  Administered 2016-02-15: 2 ug via ORAL
  Filled 2016-02-15: qty 4

## 2016-02-15 MED ORDER — LIDOCAINE HCL (PF) 1 % IJ SOLN: 5.0000 mL | INTRAMUSCULAR | Status: DC | PRN

## 2016-02-15 MED ORDER — HEPARIN SODIUM (PORCINE) 1000 UNIT/ML DIALYSIS: 1000.0000 [IU] | INTRAMUSCULAR | Status: DC | PRN

## 2016-02-15 MED ORDER — HYDROCORTISONE 1 % EX CREA
TOPICAL_CREAM | Freq: Two times a day (BID) | CUTANEOUS | Status: DC
  Administered 2016-02-15 – 2016-02-18 (×7): via TOPICAL
  Filled 2016-02-15 (×2): qty 28

## 2016-02-15 MED ORDER — DIPHENHYDRAMINE HCL 25 MG PO CAPS
ORAL_CAPSULE | ORAL | Status: AC
  Administered 2016-02-15: 50 mg via ORAL
  Filled 2016-02-15: qty 2

## 2016-02-15 MED ORDER — MIDODRINE HCL 5 MG PO TABS
ORAL_TABLET | ORAL | Status: AC
  Administered 2016-02-15: 10 mg via ORAL
  Filled 2016-02-15: qty 2

## 2016-02-15 MED ORDER — SODIUM CHLORIDE 0.9 % IV SOLN: 100.0000 mL | INTRAVENOUS | Status: DC | PRN

## 2016-02-15 MED ORDER — DIPHENHYDRAMINE-ZINC ACETATE 2-0.1 % EX CREA
TOPICAL_CREAM | Freq: Four times a day (QID) | CUTANEOUS | Status: DC | PRN
  Administered 2016-02-15: 13:00:00 via TOPICAL
  Filled 2016-02-15: qty 28

## 2016-02-15 MED ORDER — PENTAFLUOROPROP-TETRAFLUOROETH EX AERO: 1.0000 "application " | INHALATION_SPRAY | CUTANEOUS | Status: DC | PRN

## 2016-02-15 MED ORDER — LIDOCAINE-PRILOCAINE 2.5-2.5 % EX CREA: 1.0000 "application " | TOPICAL_CREAM | CUTANEOUS | Status: DC | PRN

## 2016-02-15 MED ORDER — DIPHENHYDRAMINE HCL 25 MG PO CAPS
50.0000 mg | ORAL_CAPSULE | Freq: Four times a day (QID) | ORAL | Status: DC | PRN
  Administered 2016-02-15 – 2016-02-16 (×5): 50 mg via ORAL
  Filled 2016-02-15 (×4): qty 2

## 2016-02-15 MED ORDER — ALTEPLASE 2 MG IJ SOLR: 2.0000 mg | Freq: Once | INTRAMUSCULAR | Status: DC | PRN

## 2016-02-15 MED ORDER — DOXERCALCIFEROL 4 MCG/2ML IV SOLN
4.0000 ug | INTRAVENOUS | Status: DC
  Administered 2016-02-15: 4 ug via INTRAVENOUS
  Filled 2016-02-15 (×2): qty 2

## 2016-02-15 MED ORDER — HEPARIN SODIUM (PORCINE) 1000 UNIT/ML DIALYSIS: 2000.0000 [IU] | INTRAMUSCULAR | Status: DC | PRN

## 2016-02-15 NOTE — Progress Notes (Signed)
At approximately 0345, this RN was called to patient's room.  He had rash that extended from the front of his neck, below his bandages, to top of stomach.  It also extended to right shoulder and down the right arm to the wrist.  He is c/o severe itching to the area.  B/P is stable.  He is not having any pain and has no difficulty breathing or swallowing.  Dr. Barry Dienes made aware.  Received order for PO Benadryl 50 mg which was given at 0430.  Itching did decrease.  Will continue to monitor patient.  [employer] RN-BC, WTA.

## 2016-02-15 NOTE — Procedures (Addendum)
I have personally attended this patient's dialysis session.  Left AVF 400 UF goal 2.5 BP [id number] systolic (midodrine)  3.5 Ca bath (Ca 6/alb 3.1 post PTH'ect)  Jamal Maes, MD [facility] [phone] Pager 02/15/2016, 8:13 AM

## 2016-02-15 NOTE — Progress Notes (Signed)
  [facility] Progress Note   Subjective:  Reports a little bit of tingling, no cramps Ca "stuck" at 6 Has an itchy papular rash on R arm - he thinks related to adhesive  Filed Vitals:   02/15/16 0645 02/15/16 0659 02/15/16 0715 02/15/16 0730  BP: 115/64 121/68 123/63 114/63  Pulse: 90 88 84 85  Temp: 97.7 F (36.5 C)     TempSrc: Oral     Resp: 22 20 18 16   Height:      Weight: 145.9 kg (321 lb 10.4 oz)     SpO2: 100%      Exam: Alert  Seen in HD Neck dressing in place Voice fine, no hoarseness No jvd Lungs clear Regular S1S2 No S3 No murmur Abd obese soft, obese, no focal tenderness Woody changes LE's, no pitting edema L forearm AVF +bruit - currently cannulated for HD R FA incision OK Papular rash that arm (he thinks d/t adhesive)   Inpatient medications:  . calcitRIOL  2 mcg Oral Daily  . calcium carbonate  9 tablet Oral TID BM  . [START ON 02/17/2016] ferric gluconate (FERRLECIT/NULECIT) IV  62.5 mg Intravenous Q Wed-HD  . midodrine  10 mg Oral Q Sat-HD  . midodrine  10 mg Oral Q M,W,F-HD  . multivitamin  1 tablet Oral QHS   . sodium chloride 20 mL/hr at 02/12/16 1344  . sodium chloride 10 mL/hr at 02/12/16 1545   sodium chloride, sodium chloride, acetaminophen **OR** acetaminophen, alteplase, diphenhydrAMINE, diphenhydrAMINE-zinc acetate, heparin, [START ON 02/16/2016] heparin, HYDROmorphone (DILAUDID) injection, lidocaine (PF), lidocaine-prilocaine, ondansetron **OR** ondansetron (ZOFRAN) IV, oxyCODONE, pentafluoroprop-tetrafluoroeth    Recent Labs Lab 02/14/16 0522 02/14/16 1446 02/15/16 0457  NA 133* 135 130*  K 5.0 5.1 4.9  CL 94* 97* 92*  CO2 21* 21* 18*  GLUCOSE 94 100* 74  BUN 36* 42* 48*  CREATININE 10.76* 11.66* 13.46*  CALCIUM 6.3* 6.0* 6.0*  PHOS 3.6 3.6 3.4    Recent Labs Lab 02/14/16 0522 02/14/16 1446 02/15/16 0457  ALBUMIN 3.2* 2.9* 3.1*    Recent Labs Lab 02/12/16 0809 02/13/16 0253  WBC  --  9.6  HGB  12.9* 12.1*  HCT 38.0* 36.4*  MCV  --  86.9  PLT  --  154   Dialysis: [facility]  MWF   5.5h   F200   143.5kg    LFA AVF  Hep 10,000 then 4000 midRx Venofer 50qwk No ESA Hect 8  Assessment: 1. HTPH s/p PTX 02/12/16- Ca level down 6.0, started at 6.7 (sensipar was unfortunately not stopped prior to admission)  CaCO3 was increased to 1800 mg elemental Ca (nine 500 mg tabs) tid between meals. Current rocaltrol is at 2 mcg daily. 3.5 Ca bath with HD. I think could resume hectorol (for Ca absorption - off algorithm) Will give 4 mcg with each HD for now, see if can ultimately control with just the hectorol).  Would like to see calcium trending up before pt discharged.  2. ESRD -  MWF. HD today 3. Chronic hypotension - midodrine 4. Anemia no esa, cont fe (Hb >12) 5. Rash R arm - he thinks adhesive related. Hydrocortisone creme.  Jamal Maes, MD [facility] [phone] Pager 02/15/2016, 8:05 AM

## 2016-02-16 LAB — RENAL FUNCTION PANEL
ALBUMIN: 3.2 g/dL — AB (ref 3.5–5.0)
ANION GAP: 15 (ref 5–15)
Albumin: 3.2 g/dL — ABNORMAL LOW (ref 3.5–5.0)
Anion gap: 16 — ABNORMAL HIGH (ref 5–15)
BUN: 31 mg/dL — AB (ref 6–20)
BUN: 38 mg/dL — ABNORMAL HIGH (ref 6–20)
CALCIUM: 7.3 mg/dL — AB (ref 8.9–10.3)
CHLORIDE: 94 mmol/L — AB (ref 101–111)
CO2: 20 mmol/L — AB (ref 22–32)
CO2: 25 mmol/L (ref 22–32)
Calcium: 6.9 mg/dL — ABNORMAL LOW (ref 8.9–10.3)
Chloride: 92 mmol/L — ABNORMAL LOW (ref 101–111)
Creatinine, Ser: 10.53 mg/dL — ABNORMAL HIGH (ref 0.61–1.24)
Creatinine, Ser: 11.69 mg/dL — ABNORMAL HIGH (ref 0.61–1.24)
GFR calc Af Amer: 6 mL/min — ABNORMAL LOW (ref >60)
GFR calc non Af Amer: 4 mL/min — ABNORMAL LOW (ref >60)
GFR, EST AFRICAN AMERICAN: 5 mL/min — AB (ref >60)
GFR, EST NON AFRICAN AMERICAN: 5 mL/min — AB (ref >60)
Glucose, Bld: 72 mg/dL (ref 65–99)
Glucose, Bld: 117 mg/dL — ABNORMAL HIGH (ref 65–99)
PHOSPHORUS: 3.4 mg/dL (ref 2.5–4.6)
POTASSIUM: 5.8 mmol/L — AB (ref 3.5–5.1)
POTASSIUM: 4.5 mmol/L (ref 3.5–5.1)
Phosphorus: 3.4 mg/dL (ref 2.5–4.6)
SODIUM: 132 mmol/L — AB (ref 135–145)
Sodium: 130 mmol/L — ABNORMAL LOW (ref 135–145)

## 2016-02-16 MED ORDER — PRO-STAT SUGAR FREE PO LIQD
30.0000 mL | Freq: Two times a day (BID) | ORAL | Status: DC
Start: 2016-02-16 — End: 2016-02-18
  Administered 2016-02-16 – 2016-02-18 (×4): 30 mL via ORAL
  Filled 2016-02-16 (×4): qty 30

## 2016-02-16 MED ORDER — SODIUM POLYSTYRENE SULFONATE 15 GM/60ML PO SUSP
15.0000 g | Freq: Once | ORAL | Status: AC
  Administered 2016-02-16: 15 g via ORAL
  Filled 2016-02-16: qty 60

## 2016-02-16 MED ORDER — OXYCODONE HCL 5 MG PO TABS: 5.0000 mg | ORAL_TABLET | ORAL | Status: DC | PRN

## 2016-02-16 NOTE — Progress Notes (Signed)
Patient ID: [name], male   DOB: April 13, 1971, 45 y.o.   MRN: [id number]  [facility]  Subjective: POD#4 - patient up in chair, comfortable, wants to shower.  Eating.  No pain.  Objective: Vital signs in last 24 hours: Temp:  [97.8 F (36.6 C)-98.9 F (37.2 C)] 98.4 F (36.9 C) (04/25 0848) Pulse Rate:  [93-104] 101 (04/25 0848) Resp:  [18-20] 18 (04/25 0848) BP: (107-143)/(56-95) 121/80 mmHg (04/25 0848) SpO2:  [98 %-100 %] 98 % (04/25 0848) Weight:  [143.4 kg (316 lb 2.2 oz)] 143.4 kg (316 lb 2.2 oz) (04/24 1136) Last BM Date: 02/15/16  Intake/Output from previous day: 04/24 0701 - 04/25 0700 In: 480 [P.O.:480] Out: 2500  Intake/Output this shift: Total I/O In: 240 [P.O.:240] Out: 0   Physical Exam: HEENT - sclerae clear, mucous membranes moist Neck - wound dry and intact, mild STS; voice normal Ext - no edema, non-tender; wound right forearm dry and intact Neuro - alert & oriented, no focal deficits  Lab Results:  No results for input(s): WBC, HGB, HCT, PLT in the last 72 hours. BMET  Recent Labs  02/15/16 1509 02/16/16 0511  NA 135 130*  K 4.1 5.8*  CL 95* 94*  CO2 24 20*  GLUCOSE 112* 72  BUN 23* 31*  CREATININE 8.55* 10.53*  CALCIUM 7.5* 6.9*   PT/INR No results for input(s): LABPROT, INR in the last 72 hours. Comprehensive Metabolic Panel:    Component Value Date/Time   NA 130* 02/16/2016 0511   NA 135 02/15/2016 1509   K 5.8* 02/16/2016 0511   K 4.1 02/15/2016 1509   CL 94* 02/16/2016 0511   CL 95* 02/15/2016 1509   CO2 20* 02/16/2016 0511   CO2 24 02/15/2016 1509   BUN 31* 02/16/2016 0511   BUN 23* 02/15/2016 1509   CREATININE 10.53* 02/16/2016 0511   CREATININE 8.55* 02/15/2016 1509   GLUCOSE 72 02/16/2016 0511   GLUCOSE 112* 02/15/2016 1509   CALCIUM 6.9* 02/16/2016 0511   CALCIUM 7.5* 02/15/2016 1509   AST 12 01/25/2014 0838   AST 21 09/09/2013 1711   ALT 8 01/25/2014 0838   ALT 10  09/09/2013 1711   ALKPHOS 42 01/25/2014 0838   ALKPHOS 62 09/09/2013 1711   BILITOT 0.9 01/25/2014 0838   BILITOT 1.0 09/09/2013 1711   PROT 8.3 01/25/2014 0838   PROT 9.2* 09/09/2013 1711   ALBUMIN 3.2* 02/16/2016 0511   ALBUMIN 3.2* 02/15/2016 1509    Studies/Results: No results found.  Assessment & Plans: Status post total parathyroidectomy with autotransplant to forearm  Stable from surgical standpoint  Home when calcium level stable - would like above 7.0  Probably home after HD tomorrow if approved by renal  Doing well post op  Earnstine Regal, MD, [facility] Office: [place] 02/16/2016

## 2016-02-16 NOTE — Progress Notes (Signed)
[facility] Progress Note    Subjective:    No new issues overnight Tolerated HD yesterday without issues  Objective Filed Vitals:   02/15/16 1745 02/15/16 2030 02/16/16 0509 02/16/16 0848  BP: 136/94 143/95 111/56 121/80  Pulse: 96 104 100 101  Temp: 98.9 F (37.2 C) 98.9 F (37.2 C) 98.8 F (37.1 C) 98.4 F (36.9 C)  TempSrc: Oral Oral Oral Oral  Resp: 20 18 18 18   Height:      Weight:      SpO2: 100% 98% 98% 98%   Physical Exam General: Well nourished male in NAD. Skin: Steri strips intact mid trachea area and RFA arm S/P parathyroidectomy.  Pruritic,nodular type rash R upper arm R chest Heart: S1, S2, Distant Lungs: bilateral breath sounds CTA Abdomen: Obese, nondistended, active BS Extremities:Bilateral LE edema R>L Dialysis Access: LFA AVF has areas of denuded skin on AVF.  Rotate sticks-monitor area for infection/erosion.   Additional Objective   Recent Labs Lab 02/15/16 0457 02/15/16 1509 02/16/16 0511  NA 130* 135 130*  K 4.9 4.1 5.8*  CL 92* 95* 94*  CO2 18* 24 20*  GLUCOSE 74 112* 72  BUN 48* 23* 31*  CREATININE 13.46* 8.55* 10.53*  CALCIUM 6.0* 7.5* 6.9*  PHOS 3.4 3.6 3.4     Recent Labs Lab 02/15/16 0457 02/15/16 1509 02/16/16 0511  ALBUMIN 3.1* 3.2* 3.2*     Recent Labs Lab 02/12/16 0809 02/13/16 0253  WBC  --  9.6  HGB 12.9* 12.1*  HCT 38.0* 36.4*  MCV  --  86.9  PLT  --  154       Component Value Date/Time   SDES BLOOD RIGHT FOREARM 09/12/2013 1444   SPECREQUEST BOTTLES DRAWN AEROBIC ONLY [place] 09/12/2013 1444   CULT  09/12/2013 1444    NO GROWTH 5 DAYS Performed at [place] 09/18/2013 FINAL 09/12/2013 1444    Medications: . sodium chloride 20 mL/hr at 02/12/16 1344  . sodium chloride 10 mL/hr at 02/12/16 1545   . calcitRIOL  2 mcg Oral Daily  . calcium carbonate  9 tablet Oral TID BM  . doxercalciferol  4 mcg Intravenous Q M,W,F-HD  . [START ON 02/17/2016] ferric  gluconate (FERRLECIT/NULECIT) IV  62.5 mg Intravenous Q Wed-HD  . hydrocortisone cream   Topical BID  . midodrine  10 mg Oral Q Sat-HD  . midodrine  10 mg Oral Q M,W,F-HD  . multivitamin  1 tablet Oral QHS   Dialysis Orders: [place] MonWedFri, 5 hrs 30 min, Optiflux 200NRe, BFR 450, DFR Manual 800 mL/min, EDW 143.5 (kg), Dialysate 2.0 K, 2.0 Ca, UFR Profile: Profile 2, Sodium Model: Linear, Access: AV Fistula-Standard,... Heparin: 10000 units q treatment. 4000 units mid run Hectoral: 7 mcg IV q treatment Venofer: 50 mg IV q week (last dose 02/10/16)   Assessment/Plan: 1. HTPH s/p PTX 02/12/16- Ca level now ^ 6.9  CaCO3 was increased to 1800 mg elemental Ca (nine 500 mg tabs) tid between meals. Current rocaltrol is at 2 mcg daily. 3.5 Ca bath with HD. I think could resume hectorol (for Ca absorption - off algorithm) Will give 4 mcg with each HD for now, see if can ultimately control with just the hectorol). Would like to see calcium trending up before pt discharged.  2. ESRD - MWF at [facility]. Next HD 02/17/16. K+ 5.8. 2.0 K bath.  Not sure why hyperkalemic today, not comfortable with 5.8 on non-HD day - kayexalate 15 grams X  1 3. Hypotension/Volume status- midodrine. BP Stable. HD 02/15/16 pre wt 145.9 kg Net UF 2500 Post wt 143.4 kg. Is at OP EDW and stable. Next HD tomorrow.  4. Anemia: Hgb 12.1 No ESA. Weekly venofer.  5. BMD: Ca 6.9 C Ca 7.54 Phos 3.4. Calcium Carb/hectoral as above. Is on oral calcitriol daily. No binders. Recheck renal profile this PM. 6. Nutrition: albumin 3.2 renal diet. Add prostat/renal vit 7. Rash R arm/R chest - he thinks adhesive related. Hydrocortisone creme.  Rita H. Brown NP-C 02/16/2016, 9:19 AM  [employer] [phone]  I have seen and examined this patient and agree with plan and assessment with highlighted additions. Would like Ca consistently above 7 before discharge. Calcitriol po, hectorol with HD, 9 grams Ca between  meals. Cutler Sunday B,MD 02/16/2016 12:59 PM

## 2016-02-17 LAB — RENAL FUNCTION PANEL
ALBUMIN: 3.1 g/dL — AB (ref 3.5–5.0)
ALBUMIN: 3.3 g/dL — AB (ref 3.5–5.0)
ANION GAP: 17 — AB (ref 5–15)
Albumin: 3 g/dL — ABNORMAL LOW (ref 3.5–5.0)
Anion gap: 13 (ref 5–15)
Anion gap: 13 (ref 5–15)
BUN: 45 mg/dL — ABNORMAL HIGH (ref 6–20)
BUN: 46 mg/dL — ABNORMAL HIGH (ref 6–20)
BUN: 15 mg/dL (ref 6–20)
CALCIUM: 6.9 mg/dL — AB (ref 8.9–10.3)
CALCIUM: 8 mg/dL — AB (ref 8.9–10.3)
CHLORIDE: 93 mmol/L — AB (ref 101–111)
CO2: 21 mmol/L — ABNORMAL LOW (ref 22–32)
CO2: 24 mmol/L (ref 22–32)
CO2: 27 mmol/L (ref 22–32)
CREATININE: 7.13 mg/dL — AB (ref 0.61–1.24)
Calcium: 6.8 mg/dL — ABNORMAL LOW (ref 8.9–10.3)
Chloride: 95 mmol/L — ABNORMAL LOW (ref 101–111)
Chloride: 93 mmol/L — ABNORMAL LOW (ref 101–111)
Creatinine, Ser: 13.4 mg/dL — ABNORMAL HIGH (ref 0.61–1.24)
Creatinine, Ser: 12.98 mg/dL — ABNORMAL HIGH (ref 0.61–1.24)
GFR calc Af Amer: 5 mL/min — ABNORMAL LOW (ref >60)
GFR calc non Af Amer: 4 mL/min — ABNORMAL LOW (ref >60)
GFR, EST AFRICAN AMERICAN: 4 mL/min — AB (ref >60)
GFR, EST AFRICAN AMERICAN: 9 mL/min — AB (ref >60)
GFR, EST NON AFRICAN AMERICAN: 4 mL/min — AB (ref >60)
GFR, EST NON AFRICAN AMERICAN: 8 mL/min — AB (ref >60)
GLUCOSE: 92 mg/dL (ref 65–99)
Glucose, Bld: 91 mg/dL (ref 65–99)
Glucose, Bld: 142 mg/dL — ABNORMAL HIGH (ref 65–99)
PHOSPHORUS: 3.1 mg/dL (ref 2.5–4.6)
POTASSIUM: 5.8 mmol/L — AB (ref 3.5–5.1)
Phosphorus: 3.1 mg/dL (ref 2.5–4.6)
Phosphorus: 2.4 mg/dL — ABNORMAL LOW (ref 2.5–4.6)
Potassium: 4.6 mmol/L (ref 3.5–5.1)
Potassium: 3.9 mmol/L (ref 3.5–5.1)
SODIUM: 133 mmol/L — AB (ref 135–145)
SODIUM: 133 mmol/L — AB (ref 135–145)
Sodium: 130 mmol/L — ABNORMAL LOW (ref 135–145)

## 2016-02-17 LAB — CBC
HCT: 36.4 % — ABNORMAL LOW (ref 39.0–52.0)
Hemoglobin: 12.3 g/dL — ABNORMAL LOW (ref 13.0–17.0)
MCH: 28.6 pg (ref 26.0–34.0)
MCHC: 33.8 g/dL (ref 30.0–36.0)
MCV: 84.7 fL (ref 78.0–100.0)
Platelets: 200 10*3/uL (ref 150–400)
RBC: 4.3 MIL/uL (ref 4.22–5.81)
RDW: 16.1 % — ABNORMAL HIGH (ref 11.5–15.5)
WBC: 8.1 10*3/uL (ref 4.0–10.5)

## 2016-02-17 MED ORDER — MIDODRINE HCL 5 MG PO TABS
ORAL_TABLET | ORAL | Status: AC
  Filled 2016-02-17: qty 2

## 2016-02-17 MED ORDER — SODIUM CHLORIDE 0.9 % IV SOLN: 100.0000 mL | INTRAVENOUS | Status: DC | PRN

## 2016-02-17 MED ORDER — LIDOCAINE-PRILOCAINE 2.5-2.5 % EX CREA: 1.0000 "application " | TOPICAL_CREAM | CUTANEOUS | Status: DC | PRN

## 2016-02-17 MED ORDER — MIDODRINE HCL 5 MG PO TABS
ORAL_TABLET | ORAL | Status: AC
  Administered 2016-02-17: 10 mg via ORAL
  Filled 2016-02-17: qty 2

## 2016-02-17 MED ORDER — MIDODRINE HCL 5 MG PO TABS
10.0000 mg | ORAL_TABLET | Freq: Once | ORAL | Status: AC
  Administered 2016-02-17: 10 mg via ORAL

## 2016-02-17 MED ORDER — LIDOCAINE HCL (PF) 1 % IJ SOLN: 5.0000 mL | INTRAMUSCULAR | Status: DC | PRN

## 2016-02-17 MED ORDER — PENTAFLUOROPROP-TETRAFLUOROETH EX AERO: 1.0000 "application " | INHALATION_SPRAY | CUTANEOUS | Status: DC | PRN

## 2016-02-17 NOTE — Procedures (Signed)
I have personally attended this patient's dialysis session.   3.5 Ca bath Pre labs pending Challenging weight 0.5 kg and MAY nave new EDW at discharge  Jamal Maes, MD [place] Pager 02/17/2016, 8:25 AM

## 2016-02-17 NOTE — Progress Notes (Signed)
[name] Progress Note    Subjective:    Feeling fine and would like to go home. Ca hovering high 6's low 7's HD tody  Objective Filed Vitals:   02/16/16 0848 02/16/16 1815 02/16/16 2118 02/17/16 0540  BP: 121/80 128/77 130/64 126/69  Pulse: 101 98 97 101  Temp: 98.4 F (36.9 C) 98.2 F (36.8 C) 98.5 F (36.9 C) 98.7 F (37.1 C)  TempSrc: Oral Oral Oral Oral  Resp: 18 18 19 20   Height:      Weight:      SpO2: 98% 98% 97% 100%   Physical Exam General: Well nourished male in NAD. Skin: Steri strips intact mid trachea area and RFA arm S/P parathyroidectomy.  Pruritic,nodular type rash R upper arm R chest Heart: S1, S2, Distant Lungs: bilateral breath sounds CTA Abdomen: Obese, nondistended, active BS Extremities:Bilateral LE edema R>L Dialysis Access: LFA AVF has areas of denuded skin on AVF.  Rotate sticks-monitor area for infection/erosion.   Additional Objective   Recent Labs Lab 02/16/16 0511 02/16/16 1530 02/17/16 0551  NA 130* 132* 133*  K 5.8* 4.5 5.8*  CL 94* 92* 95*  CO2 20* 25 21*  GLUCOSE 72 117* 92  BUN 31* 38* 45*  CREATININE 10.53* 11.69* 13.40*  CALCIUM 6.9* 7.3* 6.9*  PHOS 3.4 3.4 3.1     Recent Labs Lab 02/16/16 0511 02/16/16 1530 02/17/16 0551  ALBUMIN 3.2* 3.2* 3.1*     Recent Labs Lab 02/12/16 0809 02/13/16 0253  WBC  --  9.6  HGB 12.9* 12.1*  HCT 38.0* 36.4*  MCV  --  86.9  PLT  --  154       Component Value Date/Time   SDES BLOOD RIGHT FOREARM 09/12/2013 1444   SPECREQUEST BOTTLES DRAWN AEROBIC ONLY [place] 09/12/2013 1444   CULT  09/12/2013 1444    NO GROWTH 5 DAYS Performed at [place] 09/18/2013 FINAL 09/12/2013 1444    Medications: . sodium chloride 20 mL/hr at 02/12/16 1344  . sodium chloride 10 mL/hr at 02/12/16 1545   . calcitRIOL  2 mcg Oral Daily  . calcium carbonate  9 tablet Oral TID BM  . doxercalciferol  4 mcg Intravenous Q M,W,F-HD  . feeding  supplement (PRO-STAT SUGAR FREE 64)  30 mL Oral BID  . ferric gluconate (FERRLECIT/NULECIT) IV  62.5 mg Intravenous Q Wed-HD  . hydrocortisone cream   Topical BID  . midodrine  10 mg Oral Q Sat-HD  . midodrine  10 mg Oral Q M,W,F-HD  . multivitamin  1 tablet Oral QHS   Dialysis Orders: [place] MonWedFri,  5 hrs 30 min,  Optiflux 200NRe,  BFR 450,  DFR Manual 800 mL/min,  EDW 143.5 (kg),  Dialysate 2.0 K, 2.0 Ca,  UFR Profile: Profile 2,  Sodium Model: Linear,  Access: AV Fistula-Standard,... Heparin: 10000 units q treatment. 4000 units mid run Hectoral: 7 mcg IV q treatment Venofer: 50 mg IV q week (last dose 02/10/16)   Assessment/Plan: 1. HTPH s/p PTX 02/12/16- Ca hovering high 6's low 7's. CaCO3 1800 mg elemental Ca (nine 500 mg tabs) tid between meals. Current rocaltrol is at 2 mcg daily. 3.5 Ca bath with HD. Have also resumed hectorol (for Ca absorption - off algorithm) 4 mcg with each HD for now, see if can ultimately control with just the hectorol).  2. ESRD - MWF at [facility]. HD today. (Still using no heparin since post op - will change to tight for 2  weeks then back to full at discharge). Challenging weight 0.5 kg today. If post weight is under prev EDW will lower his weight 3. Hypotension/Volume status- midodrine pre and mid treatment 10 mg.   4. Anemia: Hgb 12.1 No ESA. Weekly venofer.  5. BMD: Now post PTH'ect. New regimen of Ca/calcitriol and hectorol. (no more sensipar or non ca based binders) .  6. Nutrition: albumin 3.2 renal diet. Added prostat/renal vit 7. Rash R arm/R chest - he thinks adhesive related. Hydrocortisone creme.  Jamal Maes, MD [facility] [phone] Pager 02/17/2016, 8:21 AM

## 2016-02-18 LAB — RENAL FUNCTION PANEL
ALBUMIN: 3.4 g/dL — AB (ref 3.5–5.0)
ANION GAP: 14 (ref 5–15)
BUN: 26 mg/dL — ABNORMAL HIGH (ref 6–20)
CALCIUM: 7.7 mg/dL — AB (ref 8.9–10.3)
CO2: 26 mmol/L (ref 22–32)
Chloride: 93 mmol/L — ABNORMAL LOW (ref 101–111)
Creatinine, Ser: 9.43 mg/dL — ABNORMAL HIGH (ref 0.61–1.24)
GFR, EST AFRICAN AMERICAN: 7 mL/min — AB (ref >60)
GFR, EST NON AFRICAN AMERICAN: 6 mL/min — AB (ref >60)
Glucose, Bld: 99 mg/dL (ref 65–99)
PHOSPHORUS: 2.8 mg/dL (ref 2.5–4.6)
Potassium: 4.9 mmol/L (ref 3.5–5.1)
SODIUM: 133 mmol/L — AB (ref 135–145)

## 2016-02-18 NOTE — Progress Notes (Signed)
[facility] Progress Note   Subjective:  "I'm ready to go home". No C/Os of pain/SOB Dressed, packed and waiting for DC. Reviewed DC instructions with pt. Rx given for oral calcitriol.  Objective Filed Vitals:   02/17/16 1803 02/17/16 2026 02/18/16 0449 02/18/16 0816  BP: 121/71 149/100 133/98 139/106  Pulse: 89 106 100 103  Temp: 97.5 F (36.4 C) 99 F (37.2 C) 98.5 F (36.9 C) 98.4 F (36.9 C)  TempSrc: Oral   Oral  Resp: 20   20  Height:      Weight:      SpO2: 100% 99% 97% 98%   Physical Exam General: Well nourished male in NAD. Skin: Steri strips intact mid trachea area and RFA arm S/P parathyroidectomy.  Pruritic,nodular type rash R upper arm R chest-appears to be improving.  Heart: S1, S2, Distant Lungs: bilateral breath sounds CTA Abdomen: Obese, nondistended, active BS Extremities: Persistent bilateral LE edema R>L Dialysis Access: LFA AVF has areas of denuded skin on AVF.  Rotate sticks-monitor area for infection/erosion.    Additional Objective Labs: Basic Metabolic Panel:  Recent Labs Lab 02/17/16 0813 02/17/16 1441 02/18/16 0511  NA 130* 133* 133*  K 4.6 3.9 4.9  CL 93* 93* 93*  CO2 24 27 26   GLUCOSE 91 142* 99  BUN 46* 15 26*  CREATININE 12.98* 7.13* 9.43*  CALCIUM 6.8* 8.0* 7.7*  PHOS 3.1 2.4* 2.8   Liver Function Tests:  Recent Labs Lab 02/17/16 0813 02/17/16 1441 02/18/16 0511  ALBUMIN 3.0* 3.3* 3.4*   CBC:  Recent Labs Lab 02/12/16 0809 02/13/16 0253 02/17/16 0813  WBC  --  9.6 8.1  HGB 12.9* 12.1* 12.3*  HCT 38.0* 36.4* 36.4*  MCV  --  86.9 84.7  PLT  --  154 200   Blood Culture    Component Value Date/Time   SDES BLOOD RIGHT FOREARM 09/12/2013 1444   SPECREQUEST BOTTLES DRAWN AEROBIC ONLY [place] 09/12/2013 1444   CULT  09/12/2013 1444    NO GROWTH 5 DAYS Performed at [place] 09/18/2013 FINAL 09/12/2013 1444    Studies/Results: No results found. Medications: . sodium  chloride 20 mL/hr at 02/12/16 1344  . sodium chloride 10 mL/hr at 02/12/16 1545   . calcitRIOL  2 mcg Oral Daily  . calcium carbonate  9 tablet Oral TID BM  . doxercalciferol  4 mcg Intravenous Q M,W,F-HD  . feeding supplement (PRO-STAT SUGAR FREE 64)  30 mL Oral BID  . ferric gluconate (FERRLECIT/NULECIT) IV  62.5 mg Intravenous Q Wed-HD  . hydrocortisone cream   Topical BID  . midodrine  10 mg Oral Q Sat-HD  . midodrine  10 mg Oral Q M,W,F-HD  . multivitamin  1 tablet Oral QHS   Dialysis Orders: [place] MonWedFri,  5 hrs 30 min,  Optiflux 200NRe,  BFR 450,  DFR Manual 800 mL/min,  EDW 143.5 (kg),  Dialysate 2.0 K, 2.0 Ca,  UFR Profile: Profile 2,  Sodium Model: Linear,  Access: AV Fistula-Standard,... Heparin: 10000 units q treatment. 4000 units mid run Hectoral: 7 mcg IV q treatment Venofer: 50 mg IV q week (last dose 02/10/16)   Assessment/Plan: 1. HTPH s/p PTX 02/12/16- Ca hovering high 6's low 7's. CaCO3 1800 mg elemental Ca (nine 500 mg tabs) tid between meals. Current rocaltrol is at 2 mcg daily. 3.5 Ca bath with HD. Have also resumed hectorol (for Ca absorption - off algorithm) 4 mcg with each HD for now, see if  can ultimately control with just the hectorol).For DC today.  2. ESRD - MWF at [facility]. HD 02/16/26. Tight heparin for 2 weeks. EDW lowered yesterday to 143 kg.  3. Hypotension/Volume status- midodrine pre and mid treatment 10 mg.HD 02/17/16 Post wt 143. Has persistent LE edema-will cont to lower EDW in OP center as tolerated.   4. Anemia: Hgb 12.3 No ESA. Weekly venofer.  5. BMD: Now post PTH'ect. New regimen of Ca/calcitriol and hectorol. (no more sensipar or non ca based binders). Reviewed with patient.  6. Nutrition: albumin 3.4renal diet. Added prostat/renal vit 7. Rash R arm/R chest - he thinks adhesive related. Hydrocortisone creme  Rita H. Brown NP-C 02/18/2016, 10:52 AM  [employer] [phone]  I have seen and  examined this patient and agree with plan and assessment in the above note. Pt ready for discharge to home. Arianna Delsanto B,MD 02/18/2016 11:39 AM

## 2016-02-18 NOTE — Discharge Summary (Signed)
Physician Discharge Summary [facility]  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  Admit date: 02/12/2016 Discharge date: 02/18/2016  Admission Diagnoses:  Secondary hyperparathyroidism, ESRD  Discharge Diagnoses:  Principal Problem:   Hyperparathyroidism, secondary ([place]) Active Problems:   Secondary hyperparathyroidism [facility])   Discharged Condition: good  Hospital Course: Patient was admitted for observation following parathyroid surgery.  Post op course was uncomplicated.  Pain was well controlled.  Tolerated diet.  Post op calcium level at discharge was 7.7 mg/dl.  Patient was prepared for discharge home on POD#6.  Consults: nephrology  Treatments: surgery: total parathyroidectomy with autotransplantation to forearm  Discharge Exam: Blood pressure 139/106, pulse 103, temperature 98.4 F (36.9 C), temperature source Oral, resp. rate 20, height 5\' 11"  (1.803 m), weight 143.1 kg (315 lb 7.7 oz), SpO2 98 %. See progress notes.   Disposition: Home   Discharge Instructions    Diet - low sodium heart healthy    Complete by:  As directed      Discharge instructions    Complete by:  As directed   [facility]  THYROID & PARATHYROID SURGERY:  POST-OP INSTRUCTIONS  Always review your discharge instruction sheet from the facility where your surgery was performed.  A prescription for pain medication may be given to you upon discharge.  Take your pain medication as prescribed.  If narcotic pain medicine is not needed, then you may take acetaminophen (Tylenol) or ibuprofen (Advil) as needed.  Take your usually prescribed medications unless otherwise directed.  If you need a refill on your pain medication, please contact your pharmacy. They will contact our office to request authorization.  Prescriptions will not be processed by our office after 5 pm or on weekends.  Start with a light diet upon arrival home, such  as soup and crackers or toast.  Be sure to drink plenty of fluids daily.  Resume your normal diet the day after surgery.  Most patients will experience some swelling and bruising on the chest and neck area.  Ice packs will help.  Swelling and bruising can take several days to resolve.   It is common to experience some constipation after surgery.  Increasing fluid intake and taking a stool softener will usually help or prevent this problem.  A mild laxative (Milk of Magnesia or Miralax) should be taken according to package directions if there has been no bowel movement after 48 hours.  You have steri-strips and a gauze dressing over your incision.  You may remove the gauze bandage on the second day after surgery, and you may shower at that time.  Leave your steri-strips (small skin tapes) in place directly over the incision.  These strips should remain on the skin for 5-7 days and then be removed.  You may get them wet in the shower and pat them dry.  You may resume regular (light) daily activities beginning the next day - such as daily self-care, walking, climbing stairs - gradually increasing activities as tolerated.  You may have sexual intercourse when it is comfortable.  Refrain from any heavy lifting or straining until approved by your doctor.  You may drive when you no longer are taking prescription pain medication, you can comfortably wear a seatbelt, and you can safely maneuver your car and apply brakes.  You should see your doctor in the office for a follow-up appointment approximately two to three weeks after your surgery.  Make sure that you call for  this appointment within a day or two after you arrive home to insure a convenient appointment time.  WHEN TO CALL YOUR DOCTOR: -- Fever greater than 101.5 -- Inability to urinate -- Nausea and/or vomiting - persistent -- Extreme swelling or bruising -- Continued bleeding from incision -- Increased pain, redness, or drainage from the  incision -- Difficulty swallowing or breathing -- Muscle cramping or spasms -- Numbness or tingling in hands or around lips  The clinic staff is available to answer your questions during regular business hours.  Please don't hesitate to call and ask to speak to one of the nurses if you have concerns.  Earnstine Regal, MD, [facility] Office: [phone]  Website: [email or web address]     Increase activity slowly    Complete by:  As directed      No wound care    Complete by:  As directed             Medication List    TAKE these medications        acetaminophen 500 MG tablet  Commonly known as:  TYLENOL  Take 1,000 mg by mouth every 6 (six) hours as needed (pain/headache).     b complex-vitamin c-folic acid 0.8 MG Tabs tablet  Take 1 tablet by mouth daily.     midodrine 10 MG tablet  Commonly known as:  PROAMATINE  Take 10 mg by mouth See admin instructions. Take 1 tablet (10 mg) by mouth prior to dialysis and 1 tablet (10 mg) half way through dialysis - Monday, Wednesday, Friday     oxyCODONE 5 MG immediate release tablet  Commonly known as:  Oxy IR/ROXICODONE  Take 1-2 tablets (5-10 mg total) by mouth every 4 (four) hours as needed for moderate pain.     ranitidine 150 MG capsule  Commonly known as:  ZANTAC  Take 150 mg by mouth daily as needed for heartburn. For heartburn     VELPHORO 500 MG chewable tablet  Generic drug:  sucroferric oxyhydroxide  Chew 1,000 mg by mouth 3 (three) times daily with meals.           Follow-up Information    Follow up with Earnstine Regal, MD. Schedule an appointment as soon as possible for a visit in 3 weeks.   Specialty:  General Surgery   Why:  For wound re-check   Contact information:   [place] [address] [phone]       Earnstine Regal, MD, [facility] Office: [phone]   Signed: Earnstine Regal 02/18/2016, 8:28 AM

## 2016-02-19 DIAGNOSIS — N2581 Secondary hyperparathyroidism of renal origin: Secondary | ICD-10-CM | POA: Diagnosis not present

## 2016-02-19 DIAGNOSIS — D509 Iron deficiency anemia, unspecified: Secondary | ICD-10-CM | POA: Diagnosis not present

## 2016-02-19 DIAGNOSIS — N186 End stage renal disease: Secondary | ICD-10-CM | POA: Diagnosis not present

## 2016-02-21 DIAGNOSIS — N186 End stage renal disease: Secondary | ICD-10-CM | POA: Diagnosis not present

## 2016-02-21 DIAGNOSIS — Z992 Dependence on renal dialysis: Secondary | ICD-10-CM | POA: Diagnosis not present

## 2016-02-21 DIAGNOSIS — I120 Hypertensive chronic kidney disease with stage 5 chronic kidney disease or end stage renal disease: Secondary | ICD-10-CM | POA: Diagnosis not present

## 2016-02-22 DIAGNOSIS — N2581 Secondary hyperparathyroidism of renal origin: Secondary | ICD-10-CM | POA: Diagnosis not present

## 2016-02-22 DIAGNOSIS — N186 End stage renal disease: Secondary | ICD-10-CM | POA: Diagnosis not present

## 2016-02-24 DIAGNOSIS — N2581 Secondary hyperparathyroidism of renal origin: Secondary | ICD-10-CM | POA: Diagnosis not present

## 2016-02-24 DIAGNOSIS — N186 End stage renal disease: Secondary | ICD-10-CM | POA: Diagnosis not present

## 2016-02-26 DIAGNOSIS — N2581 Secondary hyperparathyroidism of renal origin: Secondary | ICD-10-CM | POA: Diagnosis not present

## 2016-02-26 DIAGNOSIS — N186 End stage renal disease: Secondary | ICD-10-CM | POA: Diagnosis not present

## 2016-02-29 DIAGNOSIS — N2581 Secondary hyperparathyroidism of renal origin: Secondary | ICD-10-CM | POA: Diagnosis not present

## 2016-02-29 DIAGNOSIS — N186 End stage renal disease: Secondary | ICD-10-CM | POA: Diagnosis not present

## 2016-03-02 DIAGNOSIS — N186 End stage renal disease: Secondary | ICD-10-CM | POA: Diagnosis not present

## 2016-03-02 DIAGNOSIS — N2581 Secondary hyperparathyroidism of renal origin: Secondary | ICD-10-CM | POA: Diagnosis not present

## 2016-03-04 DIAGNOSIS — N2581 Secondary hyperparathyroidism of renal origin: Secondary | ICD-10-CM | POA: Diagnosis not present

## 2016-03-04 DIAGNOSIS — N186 End stage renal disease: Secondary | ICD-10-CM | POA: Diagnosis not present

## 2016-03-07 DIAGNOSIS — N2581 Secondary hyperparathyroidism of renal origin: Secondary | ICD-10-CM | POA: Diagnosis not present

## 2016-03-07 DIAGNOSIS — N186 End stage renal disease: Secondary | ICD-10-CM | POA: Diagnosis not present

## 2016-03-09 DIAGNOSIS — N2581 Secondary hyperparathyroidism of renal origin: Secondary | ICD-10-CM | POA: Diagnosis not present

## 2016-03-09 DIAGNOSIS — N186 End stage renal disease: Secondary | ICD-10-CM | POA: Diagnosis not present

## 2016-03-11 DIAGNOSIS — N186 End stage renal disease: Secondary | ICD-10-CM | POA: Diagnosis not present

## 2016-03-11 DIAGNOSIS — N2581 Secondary hyperparathyroidism of renal origin: Secondary | ICD-10-CM | POA: Diagnosis not present

## 2016-03-14 DIAGNOSIS — N2581 Secondary hyperparathyroidism of renal origin: Secondary | ICD-10-CM | POA: Diagnosis not present

## 2016-03-14 DIAGNOSIS — N186 End stage renal disease: Secondary | ICD-10-CM | POA: Diagnosis not present

## 2016-03-16 DIAGNOSIS — N2581 Secondary hyperparathyroidism of renal origin: Secondary | ICD-10-CM | POA: Diagnosis not present

## 2016-03-16 DIAGNOSIS — N186 End stage renal disease: Secondary | ICD-10-CM | POA: Diagnosis not present

## 2016-03-18 DIAGNOSIS — N186 End stage renal disease: Secondary | ICD-10-CM | POA: Diagnosis not present

## 2016-03-18 DIAGNOSIS — N2581 Secondary hyperparathyroidism of renal origin: Secondary | ICD-10-CM | POA: Diagnosis not present

## 2016-03-21 DIAGNOSIS — N2581 Secondary hyperparathyroidism of renal origin: Secondary | ICD-10-CM | POA: Diagnosis not present

## 2016-03-21 DIAGNOSIS — N186 End stage renal disease: Secondary | ICD-10-CM | POA: Diagnosis not present

## 2016-03-23 DIAGNOSIS — Z992 Dependence on renal dialysis: Secondary | ICD-10-CM | POA: Diagnosis not present

## 2016-03-23 DIAGNOSIS — I120 Hypertensive chronic kidney disease with stage 5 chronic kidney disease or end stage renal disease: Secondary | ICD-10-CM | POA: Diagnosis not present

## 2016-03-23 DIAGNOSIS — N2581 Secondary hyperparathyroidism of renal origin: Secondary | ICD-10-CM | POA: Diagnosis not present

## 2016-03-23 DIAGNOSIS — N186 End stage renal disease: Secondary | ICD-10-CM | POA: Diagnosis not present

## 2016-03-24 DIAGNOSIS — H25813 Combined forms of age-related cataract, bilateral: Secondary | ICD-10-CM | POA: Diagnosis not present

## 2016-03-25 DIAGNOSIS — N2581 Secondary hyperparathyroidism of renal origin: Secondary | ICD-10-CM | POA: Diagnosis not present

## 2016-03-25 DIAGNOSIS — N186 End stage renal disease: Secondary | ICD-10-CM | POA: Diagnosis not present

## 2016-03-28 DIAGNOSIS — N186 End stage renal disease: Secondary | ICD-10-CM | POA: Diagnosis not present

## 2016-03-28 DIAGNOSIS — N2581 Secondary hyperparathyroidism of renal origin: Secondary | ICD-10-CM | POA: Diagnosis not present

## 2016-03-30 DIAGNOSIS — N2581 Secondary hyperparathyroidism of renal origin: Secondary | ICD-10-CM | POA: Diagnosis not present

## 2016-03-30 DIAGNOSIS — N186 End stage renal disease: Secondary | ICD-10-CM | POA: Diagnosis not present

## 2016-04-01 DIAGNOSIS — N186 End stage renal disease: Secondary | ICD-10-CM | POA: Diagnosis not present

## 2016-04-01 DIAGNOSIS — N2581 Secondary hyperparathyroidism of renal origin: Secondary | ICD-10-CM | POA: Diagnosis not present

## 2016-04-04 DIAGNOSIS — N186 End stage renal disease: Secondary | ICD-10-CM | POA: Diagnosis not present

## 2016-04-04 DIAGNOSIS — N2581 Secondary hyperparathyroidism of renal origin: Secondary | ICD-10-CM | POA: Diagnosis not present

## 2016-04-06 DIAGNOSIS — N2581 Secondary hyperparathyroidism of renal origin: Secondary | ICD-10-CM | POA: Diagnosis not present

## 2016-04-06 DIAGNOSIS — N186 End stage renal disease: Secondary | ICD-10-CM | POA: Diagnosis not present

## 2016-04-08 DIAGNOSIS — N2581 Secondary hyperparathyroidism of renal origin: Secondary | ICD-10-CM | POA: Diagnosis not present

## 2016-04-08 DIAGNOSIS — N186 End stage renal disease: Secondary | ICD-10-CM | POA: Diagnosis not present

## 2016-04-11 DIAGNOSIS — N2581 Secondary hyperparathyroidism of renal origin: Secondary | ICD-10-CM | POA: Diagnosis not present

## 2016-04-11 DIAGNOSIS — N186 End stage renal disease: Secondary | ICD-10-CM | POA: Diagnosis not present

## 2016-04-13 DIAGNOSIS — N2581 Secondary hyperparathyroidism of renal origin: Secondary | ICD-10-CM | POA: Diagnosis not present

## 2016-04-13 DIAGNOSIS — N186 End stage renal disease: Secondary | ICD-10-CM | POA: Diagnosis not present

## 2016-04-15 DIAGNOSIS — N186 End stage renal disease: Secondary | ICD-10-CM | POA: Diagnosis not present

## 2016-04-15 DIAGNOSIS — N2581 Secondary hyperparathyroidism of renal origin: Secondary | ICD-10-CM | POA: Diagnosis not present

## 2016-04-18 DIAGNOSIS — N186 End stage renal disease: Secondary | ICD-10-CM | POA: Diagnosis not present

## 2016-04-18 DIAGNOSIS — N2581 Secondary hyperparathyroidism of renal origin: Secondary | ICD-10-CM | POA: Diagnosis not present

## 2016-04-20 DIAGNOSIS — N2581 Secondary hyperparathyroidism of renal origin: Secondary | ICD-10-CM | POA: Diagnosis not present

## 2016-04-20 DIAGNOSIS — N186 End stage renal disease: Secondary | ICD-10-CM | POA: Diagnosis not present

## 2016-04-22 DIAGNOSIS — N186 End stage renal disease: Secondary | ICD-10-CM | POA: Diagnosis not present

## 2016-04-22 DIAGNOSIS — I120 Hypertensive chronic kidney disease with stage 5 chronic kidney disease or end stage renal disease: Secondary | ICD-10-CM | POA: Diagnosis not present

## 2016-04-22 DIAGNOSIS — N2581 Secondary hyperparathyroidism of renal origin: Secondary | ICD-10-CM | POA: Diagnosis not present

## 2016-04-22 DIAGNOSIS — Z992 Dependence on renal dialysis: Secondary | ICD-10-CM | POA: Diagnosis not present

## 2016-04-25 DIAGNOSIS — T827XXA Infection and inflammatory reaction due to other cardiac and vascular devices, implants and grafts, initial encounter: Secondary | ICD-10-CM | POA: Diagnosis not present

## 2016-04-25 DIAGNOSIS — N2581 Secondary hyperparathyroidism of renal origin: Secondary | ICD-10-CM | POA: Diagnosis not present

## 2016-04-25 DIAGNOSIS — N186 End stage renal disease: Secondary | ICD-10-CM | POA: Diagnosis not present

## 2016-04-27 DIAGNOSIS — N2581 Secondary hyperparathyroidism of renal origin: Secondary | ICD-10-CM | POA: Diagnosis not present

## 2016-04-27 DIAGNOSIS — T827XXA Infection and inflammatory reaction due to other cardiac and vascular devices, implants and grafts, initial encounter: Secondary | ICD-10-CM | POA: Diagnosis not present

## 2016-04-27 DIAGNOSIS — N186 End stage renal disease: Secondary | ICD-10-CM | POA: Diagnosis not present

## 2016-04-29 DIAGNOSIS — N186 End stage renal disease: Secondary | ICD-10-CM | POA: Diagnosis not present

## 2016-04-29 DIAGNOSIS — N2581 Secondary hyperparathyroidism of renal origin: Secondary | ICD-10-CM | POA: Diagnosis not present

## 2016-04-29 DIAGNOSIS — T827XXA Infection and inflammatory reaction due to other cardiac and vascular devices, implants and grafts, initial encounter: Secondary | ICD-10-CM | POA: Diagnosis not present

## 2016-05-02 DIAGNOSIS — N186 End stage renal disease: Secondary | ICD-10-CM | POA: Diagnosis not present

## 2016-05-02 DIAGNOSIS — T827XXA Infection and inflammatory reaction due to other cardiac and vascular devices, implants and grafts, initial encounter: Secondary | ICD-10-CM | POA: Diagnosis not present

## 2016-05-02 DIAGNOSIS — N2581 Secondary hyperparathyroidism of renal origin: Secondary | ICD-10-CM | POA: Diagnosis not present

## 2016-05-04 DIAGNOSIS — T827XXA Infection and inflammatory reaction due to other cardiac and vascular devices, implants and grafts, initial encounter: Secondary | ICD-10-CM | POA: Diagnosis not present

## 2016-05-04 DIAGNOSIS — N186 End stage renal disease: Secondary | ICD-10-CM | POA: Diagnosis not present

## 2016-05-04 DIAGNOSIS — N2581 Secondary hyperparathyroidism of renal origin: Secondary | ICD-10-CM | POA: Diagnosis not present

## 2016-05-06 DIAGNOSIS — T827XXA Infection and inflammatory reaction due to other cardiac and vascular devices, implants and grafts, initial encounter: Secondary | ICD-10-CM | POA: Diagnosis not present

## 2016-05-06 DIAGNOSIS — N2581 Secondary hyperparathyroidism of renal origin: Secondary | ICD-10-CM | POA: Diagnosis not present

## 2016-05-06 DIAGNOSIS — N186 End stage renal disease: Secondary | ICD-10-CM | POA: Diagnosis not present

## 2016-05-09 DIAGNOSIS — N2581 Secondary hyperparathyroidism of renal origin: Secondary | ICD-10-CM | POA: Diagnosis not present

## 2016-05-09 DIAGNOSIS — N186 End stage renal disease: Secondary | ICD-10-CM | POA: Diagnosis not present

## 2016-05-09 DIAGNOSIS — T827XXA Infection and inflammatory reaction due to other cardiac and vascular devices, implants and grafts, initial encounter: Secondary | ICD-10-CM | POA: Diagnosis not present

## 2016-05-11 DIAGNOSIS — N186 End stage renal disease: Secondary | ICD-10-CM | POA: Diagnosis not present

## 2016-05-11 DIAGNOSIS — N2581 Secondary hyperparathyroidism of renal origin: Secondary | ICD-10-CM | POA: Diagnosis not present

## 2016-05-11 DIAGNOSIS — T827XXA Infection and inflammatory reaction due to other cardiac and vascular devices, implants and grafts, initial encounter: Secondary | ICD-10-CM | POA: Diagnosis not present

## 2016-05-13 DIAGNOSIS — N186 End stage renal disease: Secondary | ICD-10-CM | POA: Diagnosis not present

## 2016-05-13 DIAGNOSIS — N2581 Secondary hyperparathyroidism of renal origin: Secondary | ICD-10-CM | POA: Diagnosis not present

## 2016-05-13 DIAGNOSIS — T827XXA Infection and inflammatory reaction due to other cardiac and vascular devices, implants and grafts, initial encounter: Secondary | ICD-10-CM | POA: Diagnosis not present

## 2016-05-16 DIAGNOSIS — T827XXA Infection and inflammatory reaction due to other cardiac and vascular devices, implants and grafts, initial encounter: Secondary | ICD-10-CM | POA: Diagnosis not present

## 2016-05-16 DIAGNOSIS — N2581 Secondary hyperparathyroidism of renal origin: Secondary | ICD-10-CM | POA: Diagnosis not present

## 2016-05-16 DIAGNOSIS — N186 End stage renal disease: Secondary | ICD-10-CM | POA: Diagnosis not present

## 2016-05-18 DIAGNOSIS — N2581 Secondary hyperparathyroidism of renal origin: Secondary | ICD-10-CM | POA: Diagnosis not present

## 2016-05-18 DIAGNOSIS — N186 End stage renal disease: Secondary | ICD-10-CM | POA: Diagnosis not present

## 2016-05-18 DIAGNOSIS — T827XXA Infection and inflammatory reaction due to other cardiac and vascular devices, implants and grafts, initial encounter: Secondary | ICD-10-CM | POA: Diagnosis not present

## 2016-05-20 DIAGNOSIS — N186 End stage renal disease: Secondary | ICD-10-CM | POA: Diagnosis not present

## 2016-05-20 DIAGNOSIS — N2581 Secondary hyperparathyroidism of renal origin: Secondary | ICD-10-CM | POA: Diagnosis not present

## 2016-05-20 DIAGNOSIS — T827XXA Infection and inflammatory reaction due to other cardiac and vascular devices, implants and grafts, initial encounter: Secondary | ICD-10-CM | POA: Diagnosis not present

## 2016-05-23 DIAGNOSIS — T827XXA Infection and inflammatory reaction due to other cardiac and vascular devices, implants and grafts, initial encounter: Secondary | ICD-10-CM | POA: Diagnosis not present

## 2016-05-23 DIAGNOSIS — I120 Hypertensive chronic kidney disease with stage 5 chronic kidney disease or end stage renal disease: Secondary | ICD-10-CM | POA: Diagnosis not present

## 2016-05-23 DIAGNOSIS — Z992 Dependence on renal dialysis: Secondary | ICD-10-CM | POA: Diagnosis not present

## 2016-05-23 DIAGNOSIS — N186 End stage renal disease: Secondary | ICD-10-CM | POA: Diagnosis not present

## 2016-05-23 DIAGNOSIS — N2581 Secondary hyperparathyroidism of renal origin: Secondary | ICD-10-CM | POA: Diagnosis not present

## 2016-05-25 DIAGNOSIS — N2581 Secondary hyperparathyroidism of renal origin: Secondary | ICD-10-CM | POA: Diagnosis not present

## 2016-05-25 DIAGNOSIS — N186 End stage renal disease: Secondary | ICD-10-CM | POA: Diagnosis not present

## 2016-05-27 DIAGNOSIS — N186 End stage renal disease: Secondary | ICD-10-CM | POA: Diagnosis not present

## 2016-05-27 DIAGNOSIS — N2581 Secondary hyperparathyroidism of renal origin: Secondary | ICD-10-CM | POA: Diagnosis not present

## 2016-05-30 DIAGNOSIS — N2581 Secondary hyperparathyroidism of renal origin: Secondary | ICD-10-CM | POA: Diagnosis not present

## 2016-05-30 DIAGNOSIS — N186 End stage renal disease: Secondary | ICD-10-CM | POA: Diagnosis not present

## 2016-06-01 DIAGNOSIS — N2581 Secondary hyperparathyroidism of renal origin: Secondary | ICD-10-CM | POA: Diagnosis not present

## 2016-06-01 DIAGNOSIS — N186 End stage renal disease: Secondary | ICD-10-CM | POA: Diagnosis not present

## 2016-06-03 DIAGNOSIS — N186 End stage renal disease: Secondary | ICD-10-CM | POA: Diagnosis not present

## 2016-06-03 DIAGNOSIS — N2581 Secondary hyperparathyroidism of renal origin: Secondary | ICD-10-CM | POA: Diagnosis not present

## 2016-06-06 DIAGNOSIS — N186 End stage renal disease: Secondary | ICD-10-CM | POA: Diagnosis not present

## 2016-06-06 DIAGNOSIS — N2581 Secondary hyperparathyroidism of renal origin: Secondary | ICD-10-CM | POA: Diagnosis not present

## 2016-06-08 DIAGNOSIS — N186 End stage renal disease: Secondary | ICD-10-CM | POA: Diagnosis not present

## 2016-06-08 DIAGNOSIS — N2581 Secondary hyperparathyroidism of renal origin: Secondary | ICD-10-CM | POA: Diagnosis not present

## 2016-06-10 DIAGNOSIS — N186 End stage renal disease: Secondary | ICD-10-CM | POA: Diagnosis not present

## 2016-06-10 DIAGNOSIS — N2581 Secondary hyperparathyroidism of renal origin: Secondary | ICD-10-CM | POA: Diagnosis not present

## 2016-06-13 DIAGNOSIS — N186 End stage renal disease: Secondary | ICD-10-CM | POA: Diagnosis not present

## 2016-06-13 DIAGNOSIS — N2581 Secondary hyperparathyroidism of renal origin: Secondary | ICD-10-CM | POA: Diagnosis not present

## 2016-06-15 DIAGNOSIS — N2581 Secondary hyperparathyroidism of renal origin: Secondary | ICD-10-CM | POA: Diagnosis not present

## 2016-06-15 DIAGNOSIS — N186 End stage renal disease: Secondary | ICD-10-CM | POA: Diagnosis not present

## 2016-06-17 DIAGNOSIS — N2581 Secondary hyperparathyroidism of renal origin: Secondary | ICD-10-CM | POA: Diagnosis not present

## 2016-06-17 DIAGNOSIS — N186 End stage renal disease: Secondary | ICD-10-CM | POA: Diagnosis not present

## 2016-06-20 DIAGNOSIS — N2581 Secondary hyperparathyroidism of renal origin: Secondary | ICD-10-CM | POA: Diagnosis not present

## 2016-06-20 DIAGNOSIS — N186 End stage renal disease: Secondary | ICD-10-CM | POA: Diagnosis not present

## 2016-06-22 DIAGNOSIS — N2581 Secondary hyperparathyroidism of renal origin: Secondary | ICD-10-CM | POA: Diagnosis not present

## 2016-06-22 DIAGNOSIS — N186 End stage renal disease: Secondary | ICD-10-CM | POA: Diagnosis not present

## 2016-06-23 DIAGNOSIS — Z992 Dependence on renal dialysis: Secondary | ICD-10-CM | POA: Diagnosis not present

## 2016-06-23 DIAGNOSIS — N186 End stage renal disease: Secondary | ICD-10-CM | POA: Diagnosis not present

## 2016-06-23 DIAGNOSIS — I120 Hypertensive chronic kidney disease with stage 5 chronic kidney disease or end stage renal disease: Secondary | ICD-10-CM | POA: Diagnosis not present

## 2016-06-24 DIAGNOSIS — N2581 Secondary hyperparathyroidism of renal origin: Secondary | ICD-10-CM | POA: Diagnosis not present

## 2016-06-24 DIAGNOSIS — N186 End stage renal disease: Secondary | ICD-10-CM | POA: Diagnosis not present

## 2016-06-27 DIAGNOSIS — N186 End stage renal disease: Secondary | ICD-10-CM | POA: Diagnosis not present

## 2016-06-27 DIAGNOSIS — N2581 Secondary hyperparathyroidism of renal origin: Secondary | ICD-10-CM | POA: Diagnosis not present

## 2016-06-29 DIAGNOSIS — N2581 Secondary hyperparathyroidism of renal origin: Secondary | ICD-10-CM | POA: Diagnosis not present

## 2016-06-29 DIAGNOSIS — N186 End stage renal disease: Secondary | ICD-10-CM | POA: Diagnosis not present

## 2016-07-01 DIAGNOSIS — N2581 Secondary hyperparathyroidism of renal origin: Secondary | ICD-10-CM | POA: Diagnosis not present

## 2016-07-01 DIAGNOSIS — N186 End stage renal disease: Secondary | ICD-10-CM | POA: Diagnosis not present

## 2016-07-04 DIAGNOSIS — N186 End stage renal disease: Secondary | ICD-10-CM | POA: Diagnosis not present

## 2016-07-04 DIAGNOSIS — N2581 Secondary hyperparathyroidism of renal origin: Secondary | ICD-10-CM | POA: Diagnosis not present

## 2016-07-06 DIAGNOSIS — N186 End stage renal disease: Secondary | ICD-10-CM | POA: Diagnosis not present

## 2016-07-06 DIAGNOSIS — N2581 Secondary hyperparathyroidism of renal origin: Secondary | ICD-10-CM | POA: Diagnosis not present

## 2016-07-08 DIAGNOSIS — N2581 Secondary hyperparathyroidism of renal origin: Secondary | ICD-10-CM | POA: Diagnosis not present

## 2016-07-08 DIAGNOSIS — N186 End stage renal disease: Secondary | ICD-10-CM | POA: Diagnosis not present

## 2016-07-11 DIAGNOSIS — N186 End stage renal disease: Secondary | ICD-10-CM | POA: Diagnosis not present

## 2016-07-11 DIAGNOSIS — N2581 Secondary hyperparathyroidism of renal origin: Secondary | ICD-10-CM | POA: Diagnosis not present

## 2016-07-13 DIAGNOSIS — N2581 Secondary hyperparathyroidism of renal origin: Secondary | ICD-10-CM | POA: Diagnosis not present

## 2016-07-13 DIAGNOSIS — N186 End stage renal disease: Secondary | ICD-10-CM | POA: Diagnosis not present

## 2016-07-15 DIAGNOSIS — N186 End stage renal disease: Secondary | ICD-10-CM | POA: Diagnosis not present

## 2016-07-15 DIAGNOSIS — N2581 Secondary hyperparathyroidism of renal origin: Secondary | ICD-10-CM | POA: Diagnosis not present

## 2016-07-18 DIAGNOSIS — N2581 Secondary hyperparathyroidism of renal origin: Secondary | ICD-10-CM | POA: Diagnosis not present

## 2016-07-18 DIAGNOSIS — N186 End stage renal disease: Secondary | ICD-10-CM | POA: Diagnosis not present

## 2016-07-20 DIAGNOSIS — N186 End stage renal disease: Secondary | ICD-10-CM | POA: Diagnosis not present

## 2016-07-20 DIAGNOSIS — N2581 Secondary hyperparathyroidism of renal origin: Secondary | ICD-10-CM | POA: Diagnosis not present

## 2016-07-22 DIAGNOSIS — N2581 Secondary hyperparathyroidism of renal origin: Secondary | ICD-10-CM | POA: Diagnosis not present

## 2016-07-22 DIAGNOSIS — N186 End stage renal disease: Secondary | ICD-10-CM | POA: Diagnosis not present

## 2016-07-23 DIAGNOSIS — I120 Hypertensive chronic kidney disease with stage 5 chronic kidney disease or end stage renal disease: Secondary | ICD-10-CM | POA: Diagnosis not present

## 2016-07-23 DIAGNOSIS — Z992 Dependence on renal dialysis: Secondary | ICD-10-CM | POA: Diagnosis not present

## 2016-07-23 DIAGNOSIS — N186 End stage renal disease: Secondary | ICD-10-CM | POA: Diagnosis not present

## 2016-07-25 DIAGNOSIS — Z23 Encounter for immunization: Secondary | ICD-10-CM | POA: Diagnosis not present

## 2016-07-25 DIAGNOSIS — N2581 Secondary hyperparathyroidism of renal origin: Secondary | ICD-10-CM | POA: Diagnosis not present

## 2016-07-25 DIAGNOSIS — N186 End stage renal disease: Secondary | ICD-10-CM | POA: Diagnosis not present

## 2016-07-27 DIAGNOSIS — N2581 Secondary hyperparathyroidism of renal origin: Secondary | ICD-10-CM | POA: Diagnosis not present

## 2016-07-27 DIAGNOSIS — N186 End stage renal disease: Secondary | ICD-10-CM | POA: Diagnosis not present

## 2016-07-27 DIAGNOSIS — Z23 Encounter for immunization: Secondary | ICD-10-CM | POA: Diagnosis not present

## 2016-07-29 DIAGNOSIS — N2581 Secondary hyperparathyroidism of renal origin: Secondary | ICD-10-CM | POA: Diagnosis not present

## 2016-07-29 DIAGNOSIS — Z23 Encounter for immunization: Secondary | ICD-10-CM | POA: Diagnosis not present

## 2016-07-29 DIAGNOSIS — N186 End stage renal disease: Secondary | ICD-10-CM | POA: Diagnosis not present

## 2016-08-01 DIAGNOSIS — Z23 Encounter for immunization: Secondary | ICD-10-CM | POA: Diagnosis not present

## 2016-08-01 DIAGNOSIS — N186 End stage renal disease: Secondary | ICD-10-CM | POA: Diagnosis not present

## 2016-08-01 DIAGNOSIS — N2581 Secondary hyperparathyroidism of renal origin: Secondary | ICD-10-CM | POA: Diagnosis not present

## 2016-08-03 DIAGNOSIS — N186 End stage renal disease: Secondary | ICD-10-CM | POA: Diagnosis not present

## 2016-08-03 DIAGNOSIS — N2581 Secondary hyperparathyroidism of renal origin: Secondary | ICD-10-CM | POA: Diagnosis not present

## 2016-08-03 DIAGNOSIS — Z23 Encounter for immunization: Secondary | ICD-10-CM | POA: Diagnosis not present

## 2016-08-05 DIAGNOSIS — R109 Unspecified abdominal pain: Secondary | ICD-10-CM | POA: Diagnosis not present

## 2016-08-05 DIAGNOSIS — R1013 Epigastric pain: Secondary | ICD-10-CM | POA: Diagnosis not present

## 2016-08-05 DIAGNOSIS — R197 Diarrhea, unspecified: Secondary | ICD-10-CM | POA: Diagnosis not present

## 2016-08-05 DIAGNOSIS — N2581 Secondary hyperparathyroidism of renal origin: Secondary | ICD-10-CM | POA: Diagnosis not present

## 2016-08-05 DIAGNOSIS — Z23 Encounter for immunization: Secondary | ICD-10-CM | POA: Diagnosis not present

## 2016-08-05 DIAGNOSIS — N186 End stage renal disease: Secondary | ICD-10-CM | POA: Diagnosis not present

## 2016-08-08 DIAGNOSIS — N2581 Secondary hyperparathyroidism of renal origin: Secondary | ICD-10-CM | POA: Diagnosis not present

## 2016-08-08 DIAGNOSIS — N186 End stage renal disease: Secondary | ICD-10-CM | POA: Diagnosis not present

## 2016-08-08 DIAGNOSIS — Z23 Encounter for immunization: Secondary | ICD-10-CM | POA: Diagnosis not present

## 2016-08-10 DIAGNOSIS — N186 End stage renal disease: Secondary | ICD-10-CM | POA: Diagnosis not present

## 2016-08-10 DIAGNOSIS — Z23 Encounter for immunization: Secondary | ICD-10-CM | POA: Diagnosis not present

## 2016-08-10 DIAGNOSIS — N2581 Secondary hyperparathyroidism of renal origin: Secondary | ICD-10-CM | POA: Diagnosis not present

## 2016-08-12 DIAGNOSIS — N2581 Secondary hyperparathyroidism of renal origin: Secondary | ICD-10-CM | POA: Diagnosis not present

## 2016-08-12 DIAGNOSIS — Z23 Encounter for immunization: Secondary | ICD-10-CM | POA: Diagnosis not present

## 2016-08-12 DIAGNOSIS — N186 End stage renal disease: Secondary | ICD-10-CM | POA: Diagnosis not present

## 2016-08-15 DIAGNOSIS — N2581 Secondary hyperparathyroidism of renal origin: Secondary | ICD-10-CM | POA: Diagnosis not present

## 2016-08-15 DIAGNOSIS — Z23 Encounter for immunization: Secondary | ICD-10-CM | POA: Diagnosis not present

## 2016-08-15 DIAGNOSIS — N186 End stage renal disease: Secondary | ICD-10-CM | POA: Diagnosis not present

## 2016-08-17 DIAGNOSIS — N2581 Secondary hyperparathyroidism of renal origin: Secondary | ICD-10-CM | POA: Diagnosis not present

## 2016-08-17 DIAGNOSIS — N186 End stage renal disease: Secondary | ICD-10-CM | POA: Diagnosis not present

## 2016-08-17 DIAGNOSIS — Z23 Encounter for immunization: Secondary | ICD-10-CM | POA: Diagnosis not present

## 2016-08-19 DIAGNOSIS — N2581 Secondary hyperparathyroidism of renal origin: Secondary | ICD-10-CM | POA: Diagnosis not present

## 2016-08-19 DIAGNOSIS — N186 End stage renal disease: Secondary | ICD-10-CM | POA: Diagnosis not present

## 2016-08-19 DIAGNOSIS — Z23 Encounter for immunization: Secondary | ICD-10-CM | POA: Diagnosis not present

## 2016-08-22 DIAGNOSIS — Z23 Encounter for immunization: Secondary | ICD-10-CM | POA: Diagnosis not present

## 2016-08-22 DIAGNOSIS — N2581 Secondary hyperparathyroidism of renal origin: Secondary | ICD-10-CM | POA: Diagnosis not present

## 2016-08-22 DIAGNOSIS — N186 End stage renal disease: Secondary | ICD-10-CM | POA: Diagnosis not present

## 2016-08-23 DIAGNOSIS — N186 End stage renal disease: Secondary | ICD-10-CM | POA: Diagnosis not present

## 2016-08-23 DIAGNOSIS — I120 Hypertensive chronic kidney disease with stage 5 chronic kidney disease or end stage renal disease: Secondary | ICD-10-CM | POA: Diagnosis not present

## 2016-08-23 DIAGNOSIS — Z992 Dependence on renal dialysis: Secondary | ICD-10-CM | POA: Diagnosis not present

## 2016-08-24 DIAGNOSIS — N2581 Secondary hyperparathyroidism of renal origin: Secondary | ICD-10-CM | POA: Diagnosis not present

## 2016-08-24 DIAGNOSIS — E8779 Other fluid overload: Secondary | ICD-10-CM | POA: Diagnosis not present

## 2016-08-24 DIAGNOSIS — N186 End stage renal disease: Secondary | ICD-10-CM | POA: Diagnosis not present

## 2016-08-26 DIAGNOSIS — E8779 Other fluid overload: Secondary | ICD-10-CM | POA: Diagnosis not present

## 2016-08-26 DIAGNOSIS — N186 End stage renal disease: Secondary | ICD-10-CM | POA: Diagnosis not present

## 2016-08-26 DIAGNOSIS — N2581 Secondary hyperparathyroidism of renal origin: Secondary | ICD-10-CM | POA: Diagnosis not present

## 2016-08-29 DIAGNOSIS — N2581 Secondary hyperparathyroidism of renal origin: Secondary | ICD-10-CM | POA: Diagnosis not present

## 2016-08-29 DIAGNOSIS — N186 End stage renal disease: Secondary | ICD-10-CM | POA: Diagnosis not present

## 2016-08-29 DIAGNOSIS — E8779 Other fluid overload: Secondary | ICD-10-CM | POA: Diagnosis not present

## 2016-08-31 DIAGNOSIS — N2581 Secondary hyperparathyroidism of renal origin: Secondary | ICD-10-CM | POA: Diagnosis not present

## 2016-08-31 DIAGNOSIS — E8779 Other fluid overload: Secondary | ICD-10-CM | POA: Diagnosis not present

## 2016-08-31 DIAGNOSIS — N186 End stage renal disease: Secondary | ICD-10-CM | POA: Diagnosis not present

## 2016-09-01 DIAGNOSIS — M25562 Pain in left knee: Secondary | ICD-10-CM | POA: Diagnosis not present

## 2016-09-01 DIAGNOSIS — M25561 Pain in right knee: Secondary | ICD-10-CM | POA: Diagnosis not present

## 2016-09-02 DIAGNOSIS — N186 End stage renal disease: Secondary | ICD-10-CM | POA: Diagnosis not present

## 2016-09-02 DIAGNOSIS — E8779 Other fluid overload: Secondary | ICD-10-CM | POA: Diagnosis not present

## 2016-09-02 DIAGNOSIS — N2581 Secondary hyperparathyroidism of renal origin: Secondary | ICD-10-CM | POA: Diagnosis not present

## 2016-09-05 DIAGNOSIS — N2581 Secondary hyperparathyroidism of renal origin: Secondary | ICD-10-CM | POA: Diagnosis not present

## 2016-09-05 DIAGNOSIS — N186 End stage renal disease: Secondary | ICD-10-CM | POA: Diagnosis not present

## 2016-09-05 DIAGNOSIS — E8779 Other fluid overload: Secondary | ICD-10-CM | POA: Diagnosis not present

## 2016-09-07 DIAGNOSIS — E8779 Other fluid overload: Secondary | ICD-10-CM | POA: Diagnosis not present

## 2016-09-07 DIAGNOSIS — N2581 Secondary hyperparathyroidism of renal origin: Secondary | ICD-10-CM | POA: Diagnosis not present

## 2016-09-07 DIAGNOSIS — N186 End stage renal disease: Secondary | ICD-10-CM | POA: Diagnosis not present

## 2016-09-09 DIAGNOSIS — N186 End stage renal disease: Secondary | ICD-10-CM | POA: Diagnosis not present

## 2016-09-09 DIAGNOSIS — E8779 Other fluid overload: Secondary | ICD-10-CM | POA: Diagnosis not present

## 2016-09-09 DIAGNOSIS — N2581 Secondary hyperparathyroidism of renal origin: Secondary | ICD-10-CM | POA: Diagnosis not present

## 2016-09-12 DIAGNOSIS — N2581 Secondary hyperparathyroidism of renal origin: Secondary | ICD-10-CM | POA: Diagnosis not present

## 2016-09-12 DIAGNOSIS — E8779 Other fluid overload: Secondary | ICD-10-CM | POA: Diagnosis not present

## 2016-09-12 DIAGNOSIS — N186 End stage renal disease: Secondary | ICD-10-CM | POA: Diagnosis not present

## 2016-09-14 DIAGNOSIS — E8779 Other fluid overload: Secondary | ICD-10-CM | POA: Diagnosis not present

## 2016-09-14 DIAGNOSIS — N186 End stage renal disease: Secondary | ICD-10-CM | POA: Diagnosis not present

## 2016-09-14 DIAGNOSIS — N2581 Secondary hyperparathyroidism of renal origin: Secondary | ICD-10-CM | POA: Diagnosis not present

## 2016-09-16 DIAGNOSIS — N186 End stage renal disease: Secondary | ICD-10-CM | POA: Diagnosis not present

## 2016-09-16 DIAGNOSIS — E8779 Other fluid overload: Secondary | ICD-10-CM | POA: Diagnosis not present

## 2016-09-16 DIAGNOSIS — N2581 Secondary hyperparathyroidism of renal origin: Secondary | ICD-10-CM | POA: Diagnosis not present

## 2016-09-19 DIAGNOSIS — E8779 Other fluid overload: Secondary | ICD-10-CM | POA: Diagnosis not present

## 2016-09-19 DIAGNOSIS — Z992 Dependence on renal dialysis: Secondary | ICD-10-CM | POA: Diagnosis not present

## 2016-09-19 DIAGNOSIS — N186 End stage renal disease: Secondary | ICD-10-CM | POA: Diagnosis not present

## 2016-09-19 DIAGNOSIS — T82868A Thrombosis of vascular prosthetic devices, implants and grafts, initial encounter: Secondary | ICD-10-CM | POA: Diagnosis not present

## 2016-09-19 DIAGNOSIS — N2581 Secondary hyperparathyroidism of renal origin: Secondary | ICD-10-CM | POA: Diagnosis not present

## 2016-09-19 DIAGNOSIS — I871 Compression of vein: Secondary | ICD-10-CM | POA: Diagnosis not present

## 2016-09-20 DIAGNOSIS — N2581 Secondary hyperparathyroidism of renal origin: Secondary | ICD-10-CM | POA: Diagnosis not present

## 2016-09-20 DIAGNOSIS — E8779 Other fluid overload: Secondary | ICD-10-CM | POA: Diagnosis not present

## 2016-09-20 DIAGNOSIS — N186 End stage renal disease: Secondary | ICD-10-CM | POA: Diagnosis not present

## 2016-09-21 DIAGNOSIS — N2581 Secondary hyperparathyroidism of renal origin: Secondary | ICD-10-CM | POA: Diagnosis not present

## 2016-09-21 DIAGNOSIS — N186 End stage renal disease: Secondary | ICD-10-CM | POA: Diagnosis not present

## 2016-09-21 DIAGNOSIS — E8779 Other fluid overload: Secondary | ICD-10-CM | POA: Diagnosis not present

## 2016-09-22 DIAGNOSIS — I871 Compression of vein: Secondary | ICD-10-CM | POA: Diagnosis not present

## 2016-09-22 DIAGNOSIS — N186 End stage renal disease: Secondary | ICD-10-CM | POA: Diagnosis not present

## 2016-09-22 DIAGNOSIS — I120 Hypertensive chronic kidney disease with stage 5 chronic kidney disease or end stage renal disease: Secondary | ICD-10-CM | POA: Diagnosis not present

## 2016-09-22 DIAGNOSIS — T82858A Stenosis of vascular prosthetic devices, implants and grafts, initial encounter: Secondary | ICD-10-CM | POA: Diagnosis not present

## 2016-09-22 DIAGNOSIS — Z992 Dependence on renal dialysis: Secondary | ICD-10-CM | POA: Diagnosis not present

## 2016-09-23 DIAGNOSIS — N186 End stage renal disease: Secondary | ICD-10-CM | POA: Diagnosis not present

## 2016-09-23 DIAGNOSIS — Z8614 Personal history of Methicillin resistant Staphylococcus aureus infection: Secondary | ICD-10-CM | POA: Diagnosis not present

## 2016-09-23 DIAGNOSIS — T8249XA Other complication of vascular dialysis catheter, initial encounter: Secondary | ICD-10-CM | POA: Diagnosis not present

## 2016-09-23 DIAGNOSIS — N2581 Secondary hyperparathyroidism of renal origin: Secondary | ICD-10-CM | POA: Diagnosis not present

## 2016-09-23 DIAGNOSIS — A4902 Methicillin resistant Staphylococcus aureus infection, unspecified site: Secondary | ICD-10-CM | POA: Diagnosis not present

## 2016-09-26 DIAGNOSIS — Z8614 Personal history of Methicillin resistant Staphylococcus aureus infection: Secondary | ICD-10-CM | POA: Diagnosis not present

## 2016-09-26 DIAGNOSIS — A4902 Methicillin resistant Staphylococcus aureus infection, unspecified site: Secondary | ICD-10-CM | POA: Diagnosis not present

## 2016-09-26 DIAGNOSIS — N2581 Secondary hyperparathyroidism of renal origin: Secondary | ICD-10-CM | POA: Diagnosis not present

## 2016-09-26 DIAGNOSIS — N186 End stage renal disease: Secondary | ICD-10-CM | POA: Diagnosis not present

## 2016-09-26 DIAGNOSIS — T8249XA Other complication of vascular dialysis catheter, initial encounter: Secondary | ICD-10-CM | POA: Diagnosis not present

## 2016-09-28 DIAGNOSIS — Z8614 Personal history of Methicillin resistant Staphylococcus aureus infection: Secondary | ICD-10-CM | POA: Diagnosis not present

## 2016-09-28 DIAGNOSIS — N186 End stage renal disease: Secondary | ICD-10-CM | POA: Diagnosis not present

## 2016-09-28 DIAGNOSIS — A4902 Methicillin resistant Staphylococcus aureus infection, unspecified site: Secondary | ICD-10-CM | POA: Diagnosis not present

## 2016-09-28 DIAGNOSIS — N2581 Secondary hyperparathyroidism of renal origin: Secondary | ICD-10-CM | POA: Diagnosis not present

## 2016-09-28 DIAGNOSIS — T8249XA Other complication of vascular dialysis catheter, initial encounter: Secondary | ICD-10-CM | POA: Diagnosis not present

## 2016-09-30 DIAGNOSIS — N2581 Secondary hyperparathyroidism of renal origin: Secondary | ICD-10-CM | POA: Diagnosis not present

## 2016-09-30 DIAGNOSIS — Z8614 Personal history of Methicillin resistant Staphylococcus aureus infection: Secondary | ICD-10-CM | POA: Diagnosis not present

## 2016-09-30 DIAGNOSIS — T8249XA Other complication of vascular dialysis catheter, initial encounter: Secondary | ICD-10-CM | POA: Diagnosis not present

## 2016-09-30 DIAGNOSIS — N186 End stage renal disease: Secondary | ICD-10-CM | POA: Diagnosis not present

## 2016-09-30 DIAGNOSIS — A4902 Methicillin resistant Staphylococcus aureus infection, unspecified site: Secondary | ICD-10-CM | POA: Diagnosis not present

## 2016-10-03 DIAGNOSIS — A4902 Methicillin resistant Staphylococcus aureus infection, unspecified site: Secondary | ICD-10-CM | POA: Diagnosis not present

## 2016-10-03 DIAGNOSIS — T8249XA Other complication of vascular dialysis catheter, initial encounter: Secondary | ICD-10-CM | POA: Diagnosis not present

## 2016-10-03 DIAGNOSIS — Z8614 Personal history of Methicillin resistant Staphylococcus aureus infection: Secondary | ICD-10-CM | POA: Diagnosis not present

## 2016-10-03 DIAGNOSIS — N2581 Secondary hyperparathyroidism of renal origin: Secondary | ICD-10-CM | POA: Diagnosis not present

## 2016-10-03 DIAGNOSIS — N186 End stage renal disease: Secondary | ICD-10-CM | POA: Diagnosis not present

## 2016-10-05 DIAGNOSIS — N186 End stage renal disease: Secondary | ICD-10-CM | POA: Diagnosis not present

## 2016-10-05 DIAGNOSIS — T8249XA Other complication of vascular dialysis catheter, initial encounter: Secondary | ICD-10-CM | POA: Diagnosis not present

## 2016-10-05 DIAGNOSIS — Z8614 Personal history of Methicillin resistant Staphylococcus aureus infection: Secondary | ICD-10-CM | POA: Diagnosis not present

## 2016-10-05 DIAGNOSIS — N2581 Secondary hyperparathyroidism of renal origin: Secondary | ICD-10-CM | POA: Diagnosis not present

## 2016-10-05 DIAGNOSIS — A4902 Methicillin resistant Staphylococcus aureus infection, unspecified site: Secondary | ICD-10-CM | POA: Diagnosis not present

## 2016-10-07 DIAGNOSIS — N186 End stage renal disease: Secondary | ICD-10-CM | POA: Diagnosis not present

## 2016-10-07 DIAGNOSIS — N2581 Secondary hyperparathyroidism of renal origin: Secondary | ICD-10-CM | POA: Diagnosis not present

## 2016-10-07 DIAGNOSIS — T8249XA Other complication of vascular dialysis catheter, initial encounter: Secondary | ICD-10-CM | POA: Diagnosis not present

## 2016-10-07 DIAGNOSIS — Z8614 Personal history of Methicillin resistant Staphylococcus aureus infection: Secondary | ICD-10-CM | POA: Diagnosis not present

## 2016-10-07 DIAGNOSIS — A4902 Methicillin resistant Staphylococcus aureus infection, unspecified site: Secondary | ICD-10-CM | POA: Diagnosis not present

## 2016-10-10 DIAGNOSIS — Z8614 Personal history of Methicillin resistant Staphylococcus aureus infection: Secondary | ICD-10-CM | POA: Diagnosis not present

## 2016-10-10 DIAGNOSIS — A4902 Methicillin resistant Staphylococcus aureus infection, unspecified site: Secondary | ICD-10-CM | POA: Diagnosis not present

## 2016-10-10 DIAGNOSIS — N186 End stage renal disease: Secondary | ICD-10-CM | POA: Diagnosis not present

## 2016-10-10 DIAGNOSIS — T8249XA Other complication of vascular dialysis catheter, initial encounter: Secondary | ICD-10-CM | POA: Diagnosis not present

## 2016-10-10 DIAGNOSIS — N2581 Secondary hyperparathyroidism of renal origin: Secondary | ICD-10-CM | POA: Diagnosis not present

## 2016-10-12 DIAGNOSIS — T8249XA Other complication of vascular dialysis catheter, initial encounter: Secondary | ICD-10-CM | POA: Diagnosis not present

## 2016-10-12 DIAGNOSIS — N2581 Secondary hyperparathyroidism of renal origin: Secondary | ICD-10-CM | POA: Diagnosis not present

## 2016-10-12 DIAGNOSIS — Z8614 Personal history of Methicillin resistant Staphylococcus aureus infection: Secondary | ICD-10-CM | POA: Diagnosis not present

## 2016-10-12 DIAGNOSIS — N186 End stage renal disease: Secondary | ICD-10-CM | POA: Diagnosis not present

## 2016-10-12 DIAGNOSIS — A4902 Methicillin resistant Staphylococcus aureus infection, unspecified site: Secondary | ICD-10-CM | POA: Diagnosis not present

## 2016-10-13 ENCOUNTER — Encounter: Payer: Self-pay | Admitting: Vascular Surgery

## 2016-10-14 DIAGNOSIS — N186 End stage renal disease: Secondary | ICD-10-CM | POA: Diagnosis not present

## 2016-10-14 DIAGNOSIS — Z8614 Personal history of Methicillin resistant Staphylococcus aureus infection: Secondary | ICD-10-CM | POA: Diagnosis not present

## 2016-10-14 DIAGNOSIS — A4902 Methicillin resistant Staphylococcus aureus infection, unspecified site: Secondary | ICD-10-CM | POA: Diagnosis not present

## 2016-10-14 DIAGNOSIS — T8249XA Other complication of vascular dialysis catheter, initial encounter: Secondary | ICD-10-CM | POA: Diagnosis not present

## 2016-10-14 DIAGNOSIS — N2581 Secondary hyperparathyroidism of renal origin: Secondary | ICD-10-CM | POA: Diagnosis not present

## 2016-10-16 DIAGNOSIS — N2581 Secondary hyperparathyroidism of renal origin: Secondary | ICD-10-CM | POA: Diagnosis not present

## 2016-10-16 DIAGNOSIS — Z8614 Personal history of Methicillin resistant Staphylococcus aureus infection: Secondary | ICD-10-CM | POA: Diagnosis not present

## 2016-10-16 DIAGNOSIS — A4902 Methicillin resistant Staphylococcus aureus infection, unspecified site: Secondary | ICD-10-CM | POA: Diagnosis not present

## 2016-10-16 DIAGNOSIS — N186 End stage renal disease: Secondary | ICD-10-CM | POA: Diagnosis not present

## 2016-10-16 DIAGNOSIS — T8249XA Other complication of vascular dialysis catheter, initial encounter: Secondary | ICD-10-CM | POA: Diagnosis not present

## 2016-10-19 ENCOUNTER — Other Ambulatory Visit: Payer: Self-pay | Admitting: Vascular Surgery

## 2016-10-19 DIAGNOSIS — T8249XA Other complication of vascular dialysis catheter, initial encounter: Secondary | ICD-10-CM | POA: Diagnosis not present

## 2016-10-19 DIAGNOSIS — N2581 Secondary hyperparathyroidism of renal origin: Secondary | ICD-10-CM | POA: Diagnosis not present

## 2016-10-19 DIAGNOSIS — Z01810 Encounter for preprocedural cardiovascular examination: Secondary | ICD-10-CM

## 2016-10-19 DIAGNOSIS — N186 End stage renal disease: Secondary | ICD-10-CM | POA: Diagnosis not present

## 2016-10-19 DIAGNOSIS — A4902 Methicillin resistant Staphylococcus aureus infection, unspecified site: Secondary | ICD-10-CM | POA: Diagnosis not present

## 2016-10-19 DIAGNOSIS — Z8614 Personal history of Methicillin resistant Staphylococcus aureus infection: Secondary | ICD-10-CM | POA: Diagnosis not present

## 2016-10-20 ENCOUNTER — Ambulatory Visit (INDEPENDENT_AMBULATORY_CARE_PROVIDER_SITE_OTHER)
Admission: RE | Admit: 2016-10-20 | Discharge: 2016-10-20 | Disposition: A | Discharge status: Home / Self Care | Payer: Medicare Other | Source: Ambulatory Visit | Attending: Vascular Surgery | Admitting: Vascular Surgery

## 2016-10-20 ENCOUNTER — Ambulatory Visit (HOSPITAL_COMMUNITY)
Admission: RE | Admit: 2016-10-20 | Discharge: 2016-10-20 | Disposition: A | Discharge status: Home / Self Care | Payer: Medicare Other | Source: Ambulatory Visit | Attending: Vascular Surgery | Admitting: Vascular Surgery

## 2016-10-20 DIAGNOSIS — I70208 Unspecified atherosclerosis of native arteries of extremities, other extremity: Secondary | ICD-10-CM | POA: Insufficient documentation

## 2016-10-20 DIAGNOSIS — Z01810 Encounter for preprocedural cardiovascular examination: Secondary | ICD-10-CM

## 2016-10-20 DIAGNOSIS — N186 End stage renal disease: Secondary | ICD-10-CM | POA: Diagnosis not present

## 2016-10-21 DIAGNOSIS — A4902 Methicillin resistant Staphylococcus aureus infection, unspecified site: Secondary | ICD-10-CM | POA: Diagnosis not present

## 2016-10-21 DIAGNOSIS — Z8614 Personal history of Methicillin resistant Staphylococcus aureus infection: Secondary | ICD-10-CM | POA: Diagnosis not present

## 2016-10-21 DIAGNOSIS — N2581 Secondary hyperparathyroidism of renal origin: Secondary | ICD-10-CM | POA: Diagnosis not present

## 2016-10-21 DIAGNOSIS — N186 End stage renal disease: Secondary | ICD-10-CM | POA: Diagnosis not present

## 2016-10-21 DIAGNOSIS — T8249XA Other complication of vascular dialysis catheter, initial encounter: Secondary | ICD-10-CM | POA: Diagnosis not present

## 2016-10-23 DIAGNOSIS — N2581 Secondary hyperparathyroidism of renal origin: Secondary | ICD-10-CM | POA: Diagnosis not present

## 2016-10-23 DIAGNOSIS — N186 End stage renal disease: Secondary | ICD-10-CM | POA: Diagnosis not present

## 2016-10-23 DIAGNOSIS — A4902 Methicillin resistant Staphylococcus aureus infection, unspecified site: Secondary | ICD-10-CM | POA: Diagnosis not present

## 2016-10-23 DIAGNOSIS — Z8614 Personal history of Methicillin resistant Staphylococcus aureus infection: Secondary | ICD-10-CM | POA: Diagnosis not present

## 2016-10-23 DIAGNOSIS — I120 Hypertensive chronic kidney disease with stage 5 chronic kidney disease or end stage renal disease: Secondary | ICD-10-CM | POA: Diagnosis not present

## 2016-10-23 DIAGNOSIS — Z992 Dependence on renal dialysis: Secondary | ICD-10-CM | POA: Diagnosis not present

## 2016-10-23 DIAGNOSIS — T8249XA Other complication of vascular dialysis catheter, initial encounter: Secondary | ICD-10-CM | POA: Diagnosis not present

## 2016-10-25 ENCOUNTER — Encounter: Payer: Self-pay | Admitting: Vascular Surgery

## 2016-10-25 ENCOUNTER — Ambulatory Visit (INDEPENDENT_AMBULATORY_CARE_PROVIDER_SITE_OTHER): Payer: Medicare Other | Admitting: Vascular Surgery

## 2016-10-25 ENCOUNTER — Other Ambulatory Visit: Payer: Self-pay

## 2016-10-25 VITALS — BP 126/76 | HR 87 | Temp 97.0°F | Resp 18 | Ht 71.5 in | Wt 333.4 lb

## 2016-10-25 DIAGNOSIS — N186 End stage renal disease: Secondary | ICD-10-CM

## 2016-10-25 NOTE — Progress Notes (Signed)
Vascular and Vein Specialist of [facility]  Patient name: [name] MRN: [id number] DOB: [birth year] Sex: male  REASON FOR VISIT: Evaluate for new renal access  HPI: [name] is a 46 y.o. male well-known to me from prior access. 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 I placed a left wrist Cimino radiocephalic fistula in 3474 and these uses for hemodialysis since that time. Recently underwent a intervention at [facility] where he underwent angioplasty near the venous anastomosis with apparent rupture and extravasation. Had a covered stent placed over this and had the unfortunate thrombosis of the fistula before he left the center. He had a catheter placed and is now being dialyzed via the catheter. His procedure was on 09/22/2016. He is here today for discussion of access options and also for noninvasive map studies  Past Medical History:  Diagnosis Date  . Anemia    Iron deficiency  . Aneurysm ([place]) 02/15/2007   Right arm large aneurysm  . Chronic kidney disease   . Constipation   . End stage renal disease on dialysis ([place])   . GERD (gastroesophageal reflux disease)   . Hemodialysis finding May 2 ,2005   [facility] HD at [facility].  Marland Kitchen History of bronchitis   . Hypertension   . Sickle cell trait (HCC)     Family History  Problem Relation Age of Onset  . Hypertension Mother   . Heart disease Mother     before age 9  . Other Brother     DVT    SOCIAL HISTORY: Social History  Substance Use Topics  . Smoking status: Light Tobacco Smoker    Types: Cigars  . Smokeless tobacco: Never Used     Comment: pt states he smokes about 2 cigars per year  . Alcohol use Yes     Comment: rarely    No Known Allergies  Current Outpatient Prescriptions  Medication Sig Dispense Refill  . acetaminophen (TYLENOL) 500 MG tablet Take 1,000 mg by mouth  every 6 (six) hours as needed (pain/headache).     Marland Kitchen b complex-vitamin c-folic acid (NEPHRO-VITE) 0.8 MG TABS Take 1 tablet by mouth daily.     . calcitRIOL (ROCALTROL) 0.5 MCG capsule Take 0.5 mcg by mouth 2 (two) times daily. Takes 4 capsules twice daily.    . calcium elemental as carbonate (BARIATRIC TUMS ULTRA) 400 MG chewable tablet Chew 1,000 mg by mouth 3 (three) times daily. Takes 7 tablets three times daily.    . midodrine (PROAMATINE) 10 MG tablet Take 10 mg by mouth See admin instructions. Take 1 tablet (10 mg) by mouth prior to dialysis and 1 tablet (10 mg) half way through dialysis - Monday, Wednesday, Friday    . ondansetron (ZOFRAN) 4 MG tablet Take 4 mg by mouth every 8 (eight) hours as needed for nausea or vomiting.    . ranitidine (ZANTAC) 150 MG capsule Take 150 mg by mouth daily as needed for heartburn. For heartburn    . sucroferric oxyhydroxide (VELPHORO) 500 MG chewable tablet Chew 1,000 mg by mouth 3 (three) times daily with meals.    Marland Kitchen oxyCODONE (OXY IR/ROXICODONE) 5 MG immediate release tablet Take 1-2 tablets (5-10 mg total) by mouth every 4 (four) hours as needed for moderate pain. (Patient not taking: Reported on 10/25/2016) 20 tablet 0   No current facility-administered medications for this visit.  REVIEW OF SYSTEMS:  [X]  denotes positive finding, [ ]  denotes negative finding Cardiac  Comments:  Chest pain or chest pressure:    Shortness of breath upon exertion:    Short of breath when lying flat:    Irregular heart rhythm:        Vascular    Pain in calf, thigh, or hip brought on by ambulation:    Pain in feet at night that wakes you up from your sleep:     Blood clot in your veins:    Leg swelling:           PHYSICAL EXAM: Vitals:   10/25/16 1205  BP: 126/76  Pulse: 87  Resp: 18  Temp: 97 F (36.1 C)  TempSrc: Oral  SpO2: 98%  Weight: (!) 333 lb 6.4 oz (151.2 kg)  Height: 5' 11.5" (1.816 m)    GENERAL: The patient is a well-nourished male,  in no acute distress. The vital signs are documented above. CARDIOVASCULAR: 2+ radial pulses bilaterally. Small surface veins bilaterally PULMONARY: There is good air exchange  MUSCULOSKELETAL: There are no major deformities or cyanosis. NEUROLOGIC: No focal weakness or paresthesias are detected. SKIN: There are no ulcers or rashes noted. PSYCHIATRIC: The patient has a normal affect.  DATA:  Duplex today reveals triphasic waveforms at the radial and ulnar arteries bilaterally Vein map shows moderate basilic vein size bilaterally He has a small upper arm cephalic vein on the left.  I images veins with SonoSite ultrasound this does appear that his left basilic vein is the larger than the right. He is left-handed  MEDICAL ISSUES: Discussed options for Access. I have recommended left arm basilic vein fistula. That this may be on 1 setting with transposition ordered may be staged with the initial fistula creation and eventual transposition. Explained that we would make this decision at the time of surgery. He undergoes hemodialysis on Tuesday Wednesday and Friday. He requested I do his surgery. We will coordinate this around his dialysis schedule for outpatient left basilic vein fistula creation    Rosetta Posner, MD [facility] Vascular and Vein Specialists of [facility] Tel [phone] Pager [phone]

## 2016-10-26 DIAGNOSIS — N2581 Secondary hyperparathyroidism of renal origin: Secondary | ICD-10-CM | POA: Diagnosis not present

## 2016-10-26 DIAGNOSIS — Z8614 Personal history of Methicillin resistant Staphylococcus aureus infection: Secondary | ICD-10-CM | POA: Diagnosis not present

## 2016-10-26 DIAGNOSIS — N186 End stage renal disease: Secondary | ICD-10-CM | POA: Diagnosis not present

## 2016-10-26 DIAGNOSIS — A4902 Methicillin resistant Staphylococcus aureus infection, unspecified site: Secondary | ICD-10-CM | POA: Diagnosis not present

## 2016-10-26 DIAGNOSIS — T8249XA Other complication of vascular dialysis catheter, initial encounter: Secondary | ICD-10-CM | POA: Diagnosis not present

## 2016-10-28 DIAGNOSIS — N2581 Secondary hyperparathyroidism of renal origin: Secondary | ICD-10-CM | POA: Diagnosis not present

## 2016-10-28 DIAGNOSIS — Z8614 Personal history of Methicillin resistant Staphylococcus aureus infection: Secondary | ICD-10-CM | POA: Diagnosis not present

## 2016-10-28 DIAGNOSIS — N186 End stage renal disease: Secondary | ICD-10-CM | POA: Diagnosis not present

## 2016-10-28 DIAGNOSIS — A4902 Methicillin resistant Staphylococcus aureus infection, unspecified site: Secondary | ICD-10-CM | POA: Diagnosis not present

## 2016-10-28 DIAGNOSIS — T8249XA Other complication of vascular dialysis catheter, initial encounter: Secondary | ICD-10-CM | POA: Diagnosis not present

## 2016-10-31 DIAGNOSIS — A4902 Methicillin resistant Staphylococcus aureus infection, unspecified site: Secondary | ICD-10-CM | POA: Diagnosis not present

## 2016-10-31 DIAGNOSIS — N186 End stage renal disease: Secondary | ICD-10-CM | POA: Diagnosis not present

## 2016-10-31 DIAGNOSIS — T8249XA Other complication of vascular dialysis catheter, initial encounter: Secondary | ICD-10-CM | POA: Diagnosis not present

## 2016-10-31 DIAGNOSIS — N2581 Secondary hyperparathyroidism of renal origin: Secondary | ICD-10-CM | POA: Diagnosis not present

## 2016-11-02 DIAGNOSIS — N186 End stage renal disease: Secondary | ICD-10-CM | POA: Diagnosis not present

## 2016-11-02 DIAGNOSIS — A4902 Methicillin resistant Staphylococcus aureus infection, unspecified site: Secondary | ICD-10-CM | POA: Diagnosis not present

## 2016-11-02 DIAGNOSIS — N2581 Secondary hyperparathyroidism of renal origin: Secondary | ICD-10-CM | POA: Diagnosis not present

## 2016-11-02 DIAGNOSIS — T8249XA Other complication of vascular dialysis catheter, initial encounter: Secondary | ICD-10-CM | POA: Diagnosis not present

## 2016-11-04 DIAGNOSIS — A4902 Methicillin resistant Staphylococcus aureus infection, unspecified site: Secondary | ICD-10-CM | POA: Diagnosis not present

## 2016-11-04 DIAGNOSIS — N186 End stage renal disease: Secondary | ICD-10-CM | POA: Diagnosis not present

## 2016-11-04 DIAGNOSIS — T8249XA Other complication of vascular dialysis catheter, initial encounter: Secondary | ICD-10-CM | POA: Diagnosis not present

## 2016-11-04 DIAGNOSIS — N2581 Secondary hyperparathyroidism of renal origin: Secondary | ICD-10-CM | POA: Diagnosis not present

## 2016-11-07 DIAGNOSIS — N2581 Secondary hyperparathyroidism of renal origin: Secondary | ICD-10-CM | POA: Diagnosis not present

## 2016-11-07 DIAGNOSIS — N186 End stage renal disease: Secondary | ICD-10-CM | POA: Diagnosis not present

## 2016-11-07 DIAGNOSIS — T8249XA Other complication of vascular dialysis catheter, initial encounter: Secondary | ICD-10-CM | POA: Diagnosis not present

## 2016-11-07 DIAGNOSIS — A4902 Methicillin resistant Staphylococcus aureus infection, unspecified site: Secondary | ICD-10-CM | POA: Diagnosis not present

## 2016-11-08 ENCOUNTER — Encounter (HOSPITAL_COMMUNITY): Payer: Self-pay | Admitting: *Deleted

## 2016-11-08 DIAGNOSIS — N186 End stage renal disease: Secondary | ICD-10-CM | POA: Diagnosis not present

## 2016-11-08 DIAGNOSIS — N2581 Secondary hyperparathyroidism of renal origin: Secondary | ICD-10-CM | POA: Diagnosis not present

## 2016-11-08 DIAGNOSIS — T8249XA Other complication of vascular dialysis catheter, initial encounter: Secondary | ICD-10-CM | POA: Diagnosis not present

## 2016-11-08 DIAGNOSIS — A4902 Methicillin resistant Staphylococcus aureus infection, unspecified site: Secondary | ICD-10-CM | POA: Diagnosis not present

## 2016-11-09 ENCOUNTER — Ambulatory Visit (HOSPITAL_COMMUNITY): Admission: RE | Admit: 2016-11-09 | Payer: Medicare Other | Source: Ambulatory Visit | Admitting: Vascular Surgery

## 2016-11-09 SURGERY — TRANSPOSITION, VEIN, BASILIC
Anesthesia: Choice | Laterality: Left

## 2016-11-11 DIAGNOSIS — N2581 Secondary hyperparathyroidism of renal origin: Secondary | ICD-10-CM | POA: Diagnosis not present

## 2016-11-11 DIAGNOSIS — T8249XA Other complication of vascular dialysis catheter, initial encounter: Secondary | ICD-10-CM | POA: Diagnosis not present

## 2016-11-11 DIAGNOSIS — N186 End stage renal disease: Secondary | ICD-10-CM | POA: Diagnosis not present

## 2016-11-11 DIAGNOSIS — A4902 Methicillin resistant Staphylococcus aureus infection, unspecified site: Secondary | ICD-10-CM | POA: Diagnosis not present

## 2016-11-14 DIAGNOSIS — N186 End stage renal disease: Secondary | ICD-10-CM | POA: Diagnosis not present

## 2016-11-14 DIAGNOSIS — T8249XA Other complication of vascular dialysis catheter, initial encounter: Secondary | ICD-10-CM | POA: Diagnosis not present

## 2016-11-14 DIAGNOSIS — A4902 Methicillin resistant Staphylococcus aureus infection, unspecified site: Secondary | ICD-10-CM | POA: Diagnosis not present

## 2016-11-14 DIAGNOSIS — N2581 Secondary hyperparathyroidism of renal origin: Secondary | ICD-10-CM | POA: Diagnosis not present

## 2016-11-16 DIAGNOSIS — N186 End stage renal disease: Secondary | ICD-10-CM | POA: Diagnosis not present

## 2016-11-16 DIAGNOSIS — N2581 Secondary hyperparathyroidism of renal origin: Secondary | ICD-10-CM | POA: Diagnosis not present

## 2016-11-16 DIAGNOSIS — T8249XA Other complication of vascular dialysis catheter, initial encounter: Secondary | ICD-10-CM | POA: Diagnosis not present

## 2016-11-16 DIAGNOSIS — A4902 Methicillin resistant Staphylococcus aureus infection, unspecified site: Secondary | ICD-10-CM | POA: Diagnosis not present

## 2016-11-18 ENCOUNTER — Other Ambulatory Visit: Payer: Self-pay

## 2016-11-18 DIAGNOSIS — N186 End stage renal disease: Secondary | ICD-10-CM | POA: Diagnosis not present

## 2016-11-18 DIAGNOSIS — T8249XA Other complication of vascular dialysis catheter, initial encounter: Secondary | ICD-10-CM | POA: Diagnosis not present

## 2016-11-18 DIAGNOSIS — N2581 Secondary hyperparathyroidism of renal origin: Secondary | ICD-10-CM | POA: Diagnosis not present

## 2016-11-18 DIAGNOSIS — A4902 Methicillin resistant Staphylococcus aureus infection, unspecified site: Secondary | ICD-10-CM | POA: Diagnosis not present

## 2016-11-21 DIAGNOSIS — A4902 Methicillin resistant Staphylococcus aureus infection, unspecified site: Secondary | ICD-10-CM | POA: Diagnosis not present

## 2016-11-21 DIAGNOSIS — N186 End stage renal disease: Secondary | ICD-10-CM | POA: Diagnosis not present

## 2016-11-21 DIAGNOSIS — N2581 Secondary hyperparathyroidism of renal origin: Secondary | ICD-10-CM | POA: Diagnosis not present

## 2016-11-21 DIAGNOSIS — T8249XA Other complication of vascular dialysis catheter, initial encounter: Secondary | ICD-10-CM | POA: Diagnosis not present

## 2016-11-23 DIAGNOSIS — N2581 Secondary hyperparathyroidism of renal origin: Secondary | ICD-10-CM | POA: Diagnosis not present

## 2016-11-23 DIAGNOSIS — N186 End stage renal disease: Secondary | ICD-10-CM | POA: Diagnosis not present

## 2016-11-23 DIAGNOSIS — I120 Hypertensive chronic kidney disease with stage 5 chronic kidney disease or end stage renal disease: Secondary | ICD-10-CM | POA: Diagnosis not present

## 2016-11-23 DIAGNOSIS — A4902 Methicillin resistant Staphylococcus aureus infection, unspecified site: Secondary | ICD-10-CM | POA: Diagnosis not present

## 2016-11-23 DIAGNOSIS — Z992 Dependence on renal dialysis: Secondary | ICD-10-CM | POA: Diagnosis not present

## 2016-11-23 DIAGNOSIS — T8249XA Other complication of vascular dialysis catheter, initial encounter: Secondary | ICD-10-CM | POA: Diagnosis not present

## 2016-11-25 ENCOUNTER — Encounter (HOSPITAL_COMMUNITY): Payer: Self-pay | Admitting: *Deleted

## 2016-11-26 DIAGNOSIS — N186 End stage renal disease: Secondary | ICD-10-CM | POA: Diagnosis not present

## 2016-11-26 DIAGNOSIS — A4902 Methicillin resistant Staphylococcus aureus infection, unspecified site: Secondary | ICD-10-CM | POA: Diagnosis not present

## 2016-11-26 DIAGNOSIS — T8249XA Other complication of vascular dialysis catheter, initial encounter: Secondary | ICD-10-CM | POA: Diagnosis not present

## 2016-11-26 DIAGNOSIS — N2581 Secondary hyperparathyroidism of renal origin: Secondary | ICD-10-CM | POA: Diagnosis not present

## 2016-11-28 ENCOUNTER — Ambulatory Visit (HOSPITAL_COMMUNITY)
Admission: RE | Admit: 2016-11-28 | Discharge: 2016-11-28 | Disposition: A | Discharge status: Home / Self Care | Payer: Medicare Other | Source: Ambulatory Visit | Attending: Vascular Surgery | Admitting: Vascular Surgery

## 2016-11-28 ENCOUNTER — Ambulatory Visit (HOSPITAL_COMMUNITY): Payer: Medicare Other | Admitting: Certified Registered Nurse Anesthetist

## 2016-11-28 ENCOUNTER — Other Ambulatory Visit: Payer: Self-pay

## 2016-11-28 ENCOUNTER — Encounter (HOSPITAL_COMMUNITY)
Admission: RE | Disposition: A | Discharge status: Home / Self Care | Payer: Self-pay | Source: Ambulatory Visit | Attending: Vascular Surgery

## 2016-11-28 DIAGNOSIS — I120 Hypertensive chronic kidney disease with stage 5 chronic kidney disease or end stage renal disease: Secondary | ICD-10-CM | POA: Diagnosis not present

## 2016-11-28 DIAGNOSIS — K219 Gastro-esophageal reflux disease without esophagitis: Secondary | ICD-10-CM | POA: Insufficient documentation

## 2016-11-28 DIAGNOSIS — Z79899 Other long term (current) drug therapy: Secondary | ICD-10-CM | POA: Insufficient documentation

## 2016-11-28 DIAGNOSIS — Z992 Dependence on renal dialysis: Secondary | ICD-10-CM | POA: Insufficient documentation

## 2016-11-28 DIAGNOSIS — Z8249 Family history of ischemic heart disease and other diseases of the circulatory system: Secondary | ICD-10-CM | POA: Diagnosis not present

## 2016-11-28 DIAGNOSIS — Z8679 Personal history of other diseases of the circulatory system: Secondary | ICD-10-CM | POA: Insufficient documentation

## 2016-11-28 DIAGNOSIS — D573 Sickle-cell trait: Secondary | ICD-10-CM | POA: Diagnosis not present

## 2016-11-28 DIAGNOSIS — F17290 Nicotine dependence, other tobacco product, uncomplicated: Secondary | ICD-10-CM | POA: Insufficient documentation

## 2016-11-28 DIAGNOSIS — N186 End stage renal disease: Secondary | ICD-10-CM | POA: Insufficient documentation

## 2016-11-28 DIAGNOSIS — N185 Chronic kidney disease, stage 5: Secondary | ICD-10-CM | POA: Diagnosis not present

## 2016-11-28 DIAGNOSIS — K5900 Constipation, unspecified: Secondary | ICD-10-CM | POA: Insufficient documentation

## 2016-11-28 HISTORY — PX: BASCILIC VEIN TRANSPOSITION: SHX5742

## 2016-11-28 LAB — POCT I-STAT 4, (NA,K, GLUC, HGB,HCT)
GLUCOSE: 94 mg/dL (ref 65–99)
HCT: 42 % (ref 39.0–52.0)
Hemoglobin: 14.3 g/dL (ref 13.0–17.0)
Potassium: 5.1 mmol/L (ref 3.5–5.1)
Sodium: 138 mmol/L (ref 135–145)

## 2016-11-28 SURGERY — TRANSPOSITION, VEIN, BASILIC
Anesthesia: General | Site: Arm Upper | Laterality: Left

## 2016-11-28 MED ORDER — FENTANYL CITRATE (PF) 100 MCG/2ML IJ SOLN
INTRAMUSCULAR | Status: AC
  Filled 2016-11-28: qty 2

## 2016-11-28 MED ORDER — LIDOCAINE-EPINEPHRINE (PF) 1 %-1:200000 IJ SOLN
INTRAMUSCULAR | Status: AC
  Filled 2016-11-28: qty 30

## 2016-11-28 MED ORDER — NEOSTIGMINE METHYLSULFATE 5 MG/5ML IV SOSY
PREFILLED_SYRINGE | INTRAVENOUS | Status: DC | PRN
  Administered 2016-11-28: 5 mg via INTRAVENOUS

## 2016-11-28 MED ORDER — PHENYLEPHRINE HCL 10 MG/ML IJ SOLN
INTRAMUSCULAR | Status: AC
  Filled 2016-11-28: qty 1

## 2016-11-28 MED ORDER — ONDANSETRON HCL 4 MG/2ML IJ SOLN
INTRAMUSCULAR | Status: DC | PRN
  Administered 2016-11-28: 4 mg via INTRAVENOUS

## 2016-11-28 MED ORDER — HEPARIN SODIUM (PORCINE) 1000 UNIT/ML IJ SOLN
1.9000 mL | Freq: Once | INTRAMUSCULAR | Status: AC
  Administered 2016-11-28: 1000 [IU] via INTRAVENOUS
  Filled 2016-11-28: qty 1.9

## 2016-11-28 MED ORDER — PROPOFOL 10 MG/ML IV BOLUS
INTRAVENOUS | Status: AC
  Filled 2016-11-28: qty 20

## 2016-11-28 MED ORDER — PHENYLEPHRINE HCL 10 MG/ML IJ SOLN
INTRAMUSCULAR | Status: DC | PRN
  Administered 2016-11-28: 120 ug via INTRAVENOUS
  Administered 2016-11-28 (×2): 160 ug via INTRAVENOUS
  Administered 2016-11-28: 120 ug via INTRAVENOUS

## 2016-11-28 MED ORDER — 0.9 % SODIUM CHLORIDE (POUR BTL) OPTIME
TOPICAL | Status: DC | PRN
  Administered 2016-11-28: 1000 mL

## 2016-11-28 MED ORDER — PROPOFOL 10 MG/ML IV BOLUS
INTRAVENOUS | Status: DC | PRN
  Administered 2016-11-28: 20 mg via INTRAVENOUS
  Administered 2016-11-28: 30 mg via INTRAVENOUS
  Administered 2016-11-28: 250 mg via INTRAVENOUS

## 2016-11-28 MED ORDER — SUCCINYLCHOLINE CHLORIDE 200 MG/10ML IV SOSY
PREFILLED_SYRINGE | INTRAVENOUS | Status: DC | PRN
  Administered 2016-11-28: 120 mg via INTRAVENOUS

## 2016-11-28 MED ORDER — MIDAZOLAM HCL 5 MG/5ML IJ SOLN
INTRAMUSCULAR | Status: DC | PRN
  Administered 2016-11-28: 2 mg via INTRAVENOUS

## 2016-11-28 MED ORDER — ROCURONIUM BROMIDE 50 MG/5ML IV SOSY
PREFILLED_SYRINGE | INTRAVENOUS | Status: DC | PRN
  Administered 2016-11-28: 10 mg via INTRAVENOUS
  Administered 2016-11-28: 30 mg via INTRAVENOUS
  Administered 2016-11-28: 10 mg via INTRAVENOUS

## 2016-11-28 MED ORDER — CHLORHEXIDINE GLUCONATE CLOTH 2 % EX PADS: 6.0000 | MEDICATED_PAD | Freq: Once | CUTANEOUS | Status: DC

## 2016-11-28 MED ORDER — SODIUM CHLORIDE 0.9 % IV SOLN
INTRAVENOUS | Status: DC
  Administered 2016-11-28 (×2): via INTRAVENOUS

## 2016-11-28 MED ORDER — DEXTROSE 5 % IV SOLN
1.5000 g | INTRAVENOUS | Status: AC
  Administered 2016-11-28: 1.5 g via INTRAVENOUS
  Filled 2016-11-28: qty 1.5

## 2016-11-28 MED ORDER — FENTANYL CITRATE (PF) 100 MCG/2ML IJ SOLN
INTRAMUSCULAR | Status: DC | PRN
  Administered 2016-11-28 (×2): 25 ug via INTRAVENOUS
  Administered 2016-11-28: 50 ug via INTRAVENOUS
  Administered 2016-11-28 (×2): 25 ug via INTRAVENOUS
  Administered 2016-11-28: 50 ug via INTRAVENOUS
  Administered 2016-11-28: 25 ug via INTRAVENOUS

## 2016-11-28 MED ORDER — ALBUMIN HUMAN 5 % IV SOLN
INTRAVENOUS | Status: DC | PRN
  Administered 2016-11-28: 13:00:00 via INTRAVENOUS

## 2016-11-28 MED ORDER — HYDROMORPHONE HCL 1 MG/ML IJ SOLN
INTRAMUSCULAR | Status: AC
  Filled 2016-11-28: qty 1

## 2016-11-28 MED ORDER — ROCURONIUM BROMIDE 50 MG/5ML IV SOSY
PREFILLED_SYRINGE | INTRAVENOUS | Status: AC
  Filled 2016-11-28: qty 5

## 2016-11-28 MED ORDER — MIDAZOLAM HCL 2 MG/2ML IJ SOLN
INTRAMUSCULAR | Status: AC
  Filled 2016-11-28: qty 2

## 2016-11-28 MED ORDER — SODIUM CHLORIDE 0.9 % IV SOLN
INTRAVENOUS | Status: DC | PRN
  Administered 2016-11-28: 12:00:00

## 2016-11-28 MED ORDER — HYDROMORPHONE HCL 1 MG/ML IJ SOLN
0.2500 mg | INTRAMUSCULAR | Status: DC | PRN
  Administered 2016-11-28: 0.5 mg via INTRAVENOUS

## 2016-11-28 MED ORDER — OXYCODONE-ACETAMINOPHEN 5-325 MG PO TABS: 1.0000 | ORAL_TABLET | Freq: Four times a day (QID) | ORAL | 0 refills | Status: DC | PRN

## 2016-11-28 MED ORDER — GLYCOPYRROLATE 0.2 MG/ML IV SOSY
PREFILLED_SYRINGE | INTRAVENOUS | Status: DC | PRN
  Administered 2016-11-28: .6 mg via INTRAVENOUS

## 2016-11-28 MED ORDER — PHENYLEPHRINE HCL 10 MG/ML IJ SOLN
INTRAVENOUS | Status: DC | PRN
  Administered 2016-11-28: 100 ug/min via INTRAVENOUS

## 2016-11-28 MED ORDER — LIDOCAINE HCL (CARDIAC) 20 MG/ML IV SOLN
INTRAVENOUS | Status: DC | PRN
  Administered 2016-11-28: 20 mg via INTRAVENOUS
  Administered 2016-11-28: 100 mg via INTRAVENOUS

## 2016-11-28 SURGICAL SUPPLY — 33 items
ARMBAND PINK RESTRICT EXTREMIT (MISCELLANEOUS) ×3 IMPLANT
CANISTER SUCTION 2500CC (MISCELLANEOUS) ×3 IMPLANT
CANNULA VESSEL 3MM 2 BLNT TIP (CANNULA) ×3 IMPLANT
CLIP LIGATING EXTRA MED SLVR (CLIP) ×3 IMPLANT
CLIP LIGATING EXTRA SM BLUE (MISCELLANEOUS) ×3 IMPLANT
COVER PROBE W GEL 5X96 (DRAPES) ×3 IMPLANT
DECANTER SPIKE VIAL GLASS SM (MISCELLANEOUS) ×3 IMPLANT
DERMABOND ADVANCED (GAUZE/BANDAGES/DRESSINGS) ×2
DERMABOND ADVANCED .7 DNX12 (GAUZE/BANDAGES/DRESSINGS) ×1 IMPLANT
ELECT REM PT RETURN 9FT ADLT (ELECTROSURGICAL) ×3
ELECTRODE REM PT RTRN 9FT ADLT (ELECTROSURGICAL) ×1 IMPLANT
GEL ULTRASOUND 20GR AQUASONIC (MISCELLANEOUS) IMPLANT
GLOVE SS BIOGEL STRL SZ 7.5 (GLOVE) ×1 IMPLANT
GLOVE SUPERSENSE BIOGEL SZ 7.5 (GLOVE) ×2
GOWN STRL REUS W/ TWL LRG LVL3 (GOWN DISPOSABLE) ×3 IMPLANT
GOWN STRL REUS W/TWL LRG LVL3 (GOWN DISPOSABLE) ×6
KIT BASIN OR (CUSTOM PROCEDURE TRAY) ×3 IMPLANT
KIT ROOM TURNOVER OR (KITS) ×3 IMPLANT
NS IRRIG 1000ML POUR BTL (IV SOLUTION) ×3 IMPLANT
PACK CV ACCESS (CUSTOM PROCEDURE TRAY) ×3 IMPLANT
PAD ARMBOARD 7.5X6 YLW CONV (MISCELLANEOUS) ×6 IMPLANT
SPONGE LAP 18X18 X RAY DECT (DISPOSABLE) ×3 IMPLANT
SUT PROLENE 6 0 CC (SUTURE) ×3 IMPLANT
SUT SILK 2 0 SH (SUTURE) IMPLANT
SUT SILK 3 0 (SUTURE) ×2
SUT SILK 3-0 18XBRD TIE 12 (SUTURE) ×1 IMPLANT
SUT SILK 4 0 (SUTURE) ×2
SUT SILK 4-0 18XBRD TIE 12 (SUTURE) ×1 IMPLANT
SUT VIC AB 3-0 SH 27 (SUTURE) ×8
SUT VIC AB 3-0 SH 27X BRD (SUTURE) ×4 IMPLANT
SUT VICRYL 4-0 PS2 18IN ABS (SUTURE) ×3 IMPLANT
UNDERPAD 30X30 (UNDERPADS AND DIAPERS) ×3 IMPLANT
WATER STERILE IRR 1000ML POUR (IV SOLUTION) ×3 IMPLANT

## 2016-11-28 NOTE — H&P (Signed)
Office Visit   10/25/2016 Vascular and Vein Specialists -Sharman Crate, MD  Vascular Surgery   End stage renal disease [facility])  Dx   New Evaluation ; Referred by Elmarie Shiley, MD  Reason for Visit   Additional Documentation   Vitals:   BP 126/76 (BP Location: Right Arm, Patient Position: Sitting, Cuff Size: Normal)   Pulse 87   Temp 97 F (36.1 C) (Oral)   Resp 18   Ht 5' 11.5" (1.816 m)   Wt  333 lb 6.4 oz (151.2 kg)   SpO2 98%   BMI 45.85 kg/m   BSA 2.76 m   Flowsheets:   Custom Formula Data,   Vital Signs,   MEWS Score,   Anthropometrics,   Healthcare Directives,   Clinical Intake     Encounter Info:   Billing Info,   History,   Allergies,   Detailed Report     All Notes   Progress Notes by Rosetta Posner, MD at 10/25/2016 12:00 PM   Author: Rosetta Posner, MD Author Type: Physician Filed: 10/25/2016 12:49 PM  Note Status: Signed Cosign: Cosign Not Required Encounter Date: 10/25/2016 12:00 PM  Editor: Rosetta Posner, MD (Physician)                                          Vascular and Vein Specialist of [facility]  Patient name: [name]      MRN: [id number]        DOB: [birth year]          Sex: male  REASON FOR VISIT: Evaluate for new renal access  HPI: [name] is a 46 y.o. male well-known to me from prior access. 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 I placed a left wrist Cimino radiocephalic fistula in 7628 and these uses for hemodialysis since that time. Recently underwent a intervention at [facility] where he underwent angioplasty near the venous anastomosis with apparent rupture and extravasation. Had a covered stent placed over this and had the unfortunate thrombosis of the fistula before he left the center. He had a catheter placed and is now being dialyzed via the catheter. His procedure was on 09/22/2016. He is here today for discussion of access  options and also for noninvasive map studies      Past Medical History:  Diagnosis Date  . Anemia    Iron deficiency  . Aneurysm ([place]) 02/15/2007   Right arm large aneurysm  . Chronic kidney disease   . Constipation   . End stage renal disease on dialysis ([place])   . GERD (gastroesophageal reflux disease)   . Hemodialysis finding May 2 ,2005   First HD at [facility].  Marland Kitchen History of bronchitis   . Hypertension   . Sickle cell trait (HCC)           Family History  Problem Relation Age of Onset  . Hypertension Mother   . Heart disease Mother     before age 53  . Other Brother     DVT    SOCIAL HISTORY:        Social History  Substance Use Topics  . Smoking status: Light Tobacco Smoker    Types: Cigars  . Smokeless tobacco: Never Used  Comment: pt states he smokes about 2 cigars per year  . Alcohol use Yes      Comment: rarely    No Known Allergies        Current Outpatient Prescriptions  Medication Sig Dispense Refill  . acetaminophen (TYLENOL) 500 MG tablet Take 1,000 mg by mouth every 6 (six) hours as needed (pain/headache).     Marland Kitchen b complex-vitamin c-folic acid (NEPHRO-VITE) 0.8 MG TABS Take 1 tablet by mouth daily.     . calcitRIOL (ROCALTROL) 0.5 MCG capsule Take 0.5 mcg by mouth 2 (two) times daily. Takes 4 capsules twice daily.    . calcium elemental as carbonate (BARIATRIC TUMS ULTRA) 400 MG chewable tablet Chew 1,000 mg by mouth 3 (three) times daily. Takes 7 tablets three times daily.    . midodrine (PROAMATINE) 10 MG tablet Take 10 mg by mouth See admin instructions. Take 1 tablet (10 mg) by mouth prior to dialysis and 1 tablet (10 mg) half way through dialysis - Monday, Wednesday, Friday    . ondansetron (ZOFRAN) 4 MG tablet Take 4 mg by mouth every 8 (eight) hours as needed for nausea or vomiting.    . ranitidine (ZANTAC) 150 MG capsule Take 150 mg by mouth daily as needed for heartburn. For heartburn      . sucroferric oxyhydroxide (VELPHORO) 500 MG chewable tablet Chew 1,000 mg by mouth 3 (three) times daily with meals.    Marland Kitchen oxyCODONE (OXY IR/ROXICODONE) 5 MG immediate release tablet Take 1-2 tablets (5-10 mg total) by mouth every 4 (four) hours as needed for moderate pain. (Patient not taking: Reported on 10/25/2016) 20 tablet 0   No current facility-administered medications for this visit.     REVIEW OF SYSTEMS:  [X]  denotes positive finding, [ ]  denotes negative finding Cardiac  Comments:  Chest pain or chest pressure:    Shortness of breath upon exertion:    Short of breath when lying flat:    Irregular heart rhythm:        Vascular    Pain in calf, thigh, or hip brought on by ambulation:    Pain in feet at night that wakes you up from your sleep:     Blood clot in your veins:    Leg swelling:           PHYSICAL EXAM:    Vitals:   10/25/16 1205  BP: 126/76  Pulse: 87  Resp: 18  Temp: 97 F (36.1 C)  TempSrc: Oral  SpO2: 98%  Weight: (!) 333 lb 6.4 oz (151.2 kg)  Height: 5' 11.5" (1.816 m)    GENERAL: The patient is a well-nourished male, in no acute distress. The vital signs are documented above. CARDIOVASCULAR: 2+ radial pulses bilaterally. Small surface veins bilaterally PULMONARY: There is good air exchange  MUSCULOSKELETAL: There are no major deformities or cyanosis. NEUROLOGIC: No focal weakness or paresthesias are detected. SKIN: There are no ulcers or rashes noted. PSYCHIATRIC: The patient has a normal affect.  DATA:  Duplex today reveals triphasic waveforms at the radial and ulnar arteries bilaterally Vein map shows moderate basilic vein size bilaterally He has a small upper arm cephalic vein on the left.  I images veins with SonoSite ultrasound this does appear that his left basilic vein is the larger than the right. He is left-handed  MEDICAL ISSUES: Discussed options for Access. I have recommended left arm  basilic vein fistula. That this may be on 1 setting with transposition ordered may be staged  with the initial fistula creation and eventual transposition. Explained that we would make this decision at the time of surgery. He undergoes hemodialysis on Tuesday Wednesday and Friday. He requested I do his surgery. We will coordinate this around his dialysis schedule for outpatient left basilic vein fistula creation    Rosetta Posner, MD [facility] Vascular and Vein Specialists of [facility] [phone] Pager [phone]     Instructions  Addendum:  The patient has been re-examined and re-evaluated.  The patient's history and physical has been reviewed and is unchanged.    [name] is a 46 y.o. male is being admitted with End Stage Renal Disease  N18.6. All the risks, benefits and other treatment options have been discussed with the patient. The patient has consented to proceed with Procedure(s): [place] as a surgical intervention.  Curt Jews 11/28/2016 8:04 AM Vascular and Vein Surgery

## 2016-11-28 NOTE — Anesthesia Procedure Notes (Signed)
Procedure Name: Intubation Date/Time: 11/28/2016 11:32 AM Performed by: Shirlyn Goltz Pre-anesthesia Checklist: Patient identified, Emergency Drugs available, Suction available and Patient being monitored Patient Re-evaluated:Patient Re-evaluated prior to inductionOxygen Delivery Method: Circle system utilized Preoxygenation: Pre-oxygenation with 100% oxygen Intubation Type: IV induction Ventilation: Mask ventilation without difficulty and Oral airway inserted - appropriate to patient size Laryngoscope Size: Glidescope and 4 Grade View: Grade I Tube type: Oral Tube size: 7.0 mm Number of attempts: 1 Airway Equipment and Method: Rigid stylet and Video-laryngoscopy Placement Confirmation: positive ETCO2,  ETT inserted through vocal cords under direct vision and breath sounds checked- equal and bilateral Secured at: 21 cm Tube secured with: Tape Dental Injury: Teeth and Oropharynx as per pre-operative assessment

## 2016-11-28 NOTE — Anesthesia Postprocedure Evaluation (Signed)
Anesthesia Post Note  Patient: [name]  Procedure(s) Performed: Procedure(s) (LRB): BASCILIC VEIN TRANSPOSITION- LEFT ARM (Left)  Patient location during evaluation: PACU Anesthesia Type: General Level of consciousness: awake Vital Signs Assessment: post-procedure vital signs reviewed and stable Respiratory status: spontaneous breathing Cardiovascular status: stable Anesthetic complications: no       Last Vitals:  Vitals:   11/28/16 1440 11/28/16 1445  BP: (!) 102/53 101/60  Pulse: 95 96  Resp: (!) 28 (!) 27  Temp:  36.3 C    Last Pain:  Vitals:   11/28/16 1445  TempSrc:   PainSc: Asleep                 Yehia Mcbain

## 2016-11-28 NOTE — Op Note (Signed)
    OPERATIVE REPORT  DATE OF SURGERY: 11/28/2016  PATIENT: [name], 46 y.o. male MRN: [id number]  DOB: 30-Sep-1971  PRE-OPERATIVE DIAGNOSIS: End-stage renal disease  POST-OPERATIVE DIAGNOSIS:  Same  PROCEDURE: Left arm basilic vein transposition fistula  SURGEON:  Curt Jews, M.D.  PHYSICIAN ASSISTANT: Silva Bandy and Leontine Locket PA-C  ANESTHESIA:  Gen.  EBL: 50 ml  Total I/O In: 1450 [I.V.:1200; IV Piggyback:250] Out: 50 [Blood:50]  BLOOD ADMINISTERED: None  DRAINS: None  SPECIMEN: None  COUNTS CORRECT:  YES  PLAN OF CARE: PACU   PATIENT DISPOSITION:  PACU - hemodynamically stable  PROCEDURE DETAILS: The patient was taken to the operating placed supine position where the area of the left arm was prepped and draped in usual sterile fashion. Ultrasound SonoSite visualization revealed the location of the silicone vein which was of moderate size. The vein branched just above the elbow and several different branches. Incision was made over the larger of these which was on the medial aspect of the forearm just below the antecubital space. The temperature branches were ligated with 3 or 4 silk ties and divided. Several skin bridges were left in the vein was exposed from the antecubital space to the axilla. The vein was of good caliber above the antecubital space. Tributary branches this level were ligated with 301 2-0 silk ties and divided. Separate incision was made over the brachial artery at the antecubital space. The artery had minimal atherosclerotic change and was of good size. The vein was ligated below the level of the antecubital space and divided. The vein was marked to prevent twisting and was brought to the axillary incision. A tunnel was created between the level of the antecubital incision with the brachial artery was exposed. The basilic vein was brought through the tunnel. The vein was gently dilated with heparinized saline was of excellent caliber. The  brachial artery was occluded proximally and distally and was opened with an 11 blade and extended longitudinally with Potts scissors. The vein was cut to appropriate length and was spatulated and sewn end-to-side to the artery with a running 6-0 Prolene suture. Clamps removed and excellent thrill was noted. Wounds irrigated with saline. Hemostasis tablet cautery. Wounds were closed with 3-0 Vicryl subcuticular and subcutaneous tissue. Sterile dressing was applied the patient was transferred to the recovery room in stable condition   Rosetta Posner, M.D., [facility] 11/28/2016 2:21 PM

## 2016-11-28 NOTE — Anesthesia Preprocedure Evaluation (Addendum)
Anesthesia Evaluation  Patient identified by MRN, date of birth, ID band Patient awake    Reviewed: Allergy & Precautions, NPO status , Patient's Chart, lab work & pertinent test results  Airway Mallampati: II  TM Distance: >3 FB     Dental   Pulmonary Current Smoker,    breath sounds clear to auscultation       Cardiovascular hypertension,  Rhythm:Regular Rate:Normal     Neuro/Psych    GI/Hepatic Neg liver ROS, GERD  ,  Endo/Other    Renal/GU Renal disease     Musculoskeletal   Abdominal   Peds  Hematology  (+) anemia ,   Anesthesia Other Findings   Reproductive/Obstetrics                             Anesthesia Physical Anesthesia Plan  ASA: III  Anesthesia Plan: General   Post-op Pain Management:    Induction: Intravenous  Airway Management Planned: Simple Face Mask  Additional Equipment:   Intra-op Plan:   Post-operative Plan:   Informed Consent: I have reviewed the patients History and Physical, chart, labs and discussed the procedure including the risks, benefits and alternatives for the proposed anesthesia with the patient or authorized representative who has indicated his/her understanding and acceptance.   Dental advisory given  Plan Discussed with: CRNA and Anesthesiologist  Anesthesia Plan Comments:        Anesthesia Quick Evaluation

## 2016-11-28 NOTE — Transfer of Care (Signed)
Immediate Anesthesia Transfer of Care Note  Patient: [name]  Procedure(s) Performed: Procedure(s): [place]- LEFT ARM (Left)  Patient Location: PACU  Anesthesia Type:General  Level of Consciousness: awake, alert , oriented and patient cooperative  Airway & Oxygen Therapy: Patient Spontanous Breathing and Patient connected to face mask oxygen  Post-op Assessment: Report given to RN and Post -op Vital signs reviewed and stable  Post vital signs: Reviewed and stable  Last Vitals:  Vitals:   11/28/16 0705 11/28/16 1414  BP: 124/66   Pulse: 63   Resp: 18   Temp: 36.8 C (P) 36.1 C    Last Pain:  Vitals:   11/28/16 0705  TempSrc: Oral         Complications: No apparent anesthesia complications

## 2016-11-29 ENCOUNTER — Encounter (HOSPITAL_COMMUNITY): Payer: Self-pay | Admitting: Vascular Surgery

## 2016-11-29 DIAGNOSIS — N2581 Secondary hyperparathyroidism of renal origin: Secondary | ICD-10-CM | POA: Diagnosis not present

## 2016-11-29 DIAGNOSIS — A4902 Methicillin resistant Staphylococcus aureus infection, unspecified site: Secondary | ICD-10-CM | POA: Diagnosis not present

## 2016-11-29 DIAGNOSIS — T8249XA Other complication of vascular dialysis catheter, initial encounter: Secondary | ICD-10-CM | POA: Diagnosis not present

## 2016-11-29 DIAGNOSIS — N186 End stage renal disease: Secondary | ICD-10-CM | POA: Diagnosis not present

## 2016-11-30 DIAGNOSIS — N186 End stage renal disease: Secondary | ICD-10-CM | POA: Diagnosis not present

## 2016-11-30 DIAGNOSIS — T8249XA Other complication of vascular dialysis catheter, initial encounter: Secondary | ICD-10-CM | POA: Diagnosis not present

## 2016-11-30 DIAGNOSIS — N2581 Secondary hyperparathyroidism of renal origin: Secondary | ICD-10-CM | POA: Diagnosis not present

## 2016-11-30 DIAGNOSIS — A4902 Methicillin resistant Staphylococcus aureus infection, unspecified site: Secondary | ICD-10-CM | POA: Diagnosis not present

## 2016-12-02 DIAGNOSIS — T8249XA Other complication of vascular dialysis catheter, initial encounter: Secondary | ICD-10-CM | POA: Diagnosis not present

## 2016-12-02 DIAGNOSIS — N2581 Secondary hyperparathyroidism of renal origin: Secondary | ICD-10-CM | POA: Diagnosis not present

## 2016-12-02 DIAGNOSIS — A4902 Methicillin resistant Staphylococcus aureus infection, unspecified site: Secondary | ICD-10-CM | POA: Diagnosis not present

## 2016-12-02 DIAGNOSIS — N186 End stage renal disease: Secondary | ICD-10-CM | POA: Diagnosis not present

## 2016-12-05 DIAGNOSIS — A4902 Methicillin resistant Staphylococcus aureus infection, unspecified site: Secondary | ICD-10-CM | POA: Diagnosis not present

## 2016-12-05 DIAGNOSIS — N2581 Secondary hyperparathyroidism of renal origin: Secondary | ICD-10-CM | POA: Diagnosis not present

## 2016-12-05 DIAGNOSIS — N186 End stage renal disease: Secondary | ICD-10-CM | POA: Diagnosis not present

## 2016-12-05 DIAGNOSIS — T8249XA Other complication of vascular dialysis catheter, initial encounter: Secondary | ICD-10-CM | POA: Diagnosis not present

## 2016-12-07 DIAGNOSIS — T8249XA Other complication of vascular dialysis catheter, initial encounter: Secondary | ICD-10-CM | POA: Diagnosis not present

## 2016-12-07 DIAGNOSIS — N2581 Secondary hyperparathyroidism of renal origin: Secondary | ICD-10-CM | POA: Diagnosis not present

## 2016-12-07 DIAGNOSIS — N186 End stage renal disease: Secondary | ICD-10-CM | POA: Diagnosis not present

## 2016-12-07 DIAGNOSIS — A4902 Methicillin resistant Staphylococcus aureus infection, unspecified site: Secondary | ICD-10-CM | POA: Diagnosis not present

## 2016-12-09 DIAGNOSIS — A4902 Methicillin resistant Staphylococcus aureus infection, unspecified site: Secondary | ICD-10-CM | POA: Diagnosis not present

## 2016-12-09 DIAGNOSIS — N2581 Secondary hyperparathyroidism of renal origin: Secondary | ICD-10-CM | POA: Diagnosis not present

## 2016-12-09 DIAGNOSIS — T8249XA Other complication of vascular dialysis catheter, initial encounter: Secondary | ICD-10-CM | POA: Diagnosis not present

## 2016-12-09 DIAGNOSIS — N186 End stage renal disease: Secondary | ICD-10-CM | POA: Diagnosis not present

## 2016-12-10 ENCOUNTER — Encounter (HOSPITAL_COMMUNITY): Payer: Self-pay | Admitting: Emergency Medicine

## 2016-12-10 ENCOUNTER — Emergency Department (HOSPITAL_COMMUNITY): Payer: Medicare Other

## 2016-12-10 ENCOUNTER — Emergency Department (HOSPITAL_COMMUNITY)
Admission: EM | Admit: 2016-12-10 | Discharge: 2016-12-10 | Disposition: A | Discharge status: Home / Self Care | Payer: Medicare Other | Attending: Emergency Medicine | Admitting: Emergency Medicine

## 2016-12-10 DIAGNOSIS — Y731 Therapeutic (nonsurgical) and rehabilitative gastroenterology and urology devices associated with adverse incidents: Secondary | ICD-10-CM | POA: Diagnosis not present

## 2016-12-10 DIAGNOSIS — F17290 Nicotine dependence, other tobacco product, uncomplicated: Secondary | ICD-10-CM | POA: Diagnosis not present

## 2016-12-10 DIAGNOSIS — T8241XA Breakdown (mechanical) of vascular dialysis catheter, initial encounter: Secondary | ICD-10-CM | POA: Diagnosis not present

## 2016-12-10 DIAGNOSIS — Z992 Dependence on renal dialysis: Secondary | ICD-10-CM | POA: Insufficient documentation

## 2016-12-10 DIAGNOSIS — Z79899 Other long term (current) drug therapy: Secondary | ICD-10-CM | POA: Diagnosis not present

## 2016-12-10 DIAGNOSIS — N186 End stage renal disease: Secondary | ICD-10-CM | POA: Diagnosis not present

## 2016-12-10 DIAGNOSIS — Z452 Encounter for adjustment and management of vascular access device: Secondary | ICD-10-CM | POA: Diagnosis not present

## 2016-12-10 DIAGNOSIS — I120 Hypertensive chronic kidney disease with stage 5 chronic kidney disease or end stage renal disease: Secondary | ICD-10-CM | POA: Insufficient documentation

## 2016-12-10 NOTE — ED Notes (Signed)
Pt returned from X-ray.  

## 2016-12-10 NOTE — ED Notes (Signed)
Pt noticed that his dialysis catheter in his R chest started coming out today.  It was placed in December.  Catheter noted to be approximately 1/2 inch from insertion site.  No redness or swelling at the site.  Mild tenderness that pt attributes to the catheter coming out.  Awaiting chest X-ray.

## 2016-12-10 NOTE — ED Provider Notes (Signed)
[facility] DEPT Provider Note   CSN: [id number] Arrival date & time: 12/10/16  1944  By signing my name below, I, Charolotte Eke, attest that this documentation has been prepared under the direction and in the presence of Debroah Baller, NP. Electronically Signed: Charolotte Eke, Scribe. 12/10/16. 9:55 PM.   History   Chief Complaint Chief Complaint  Patient presents with  . Vascular Access Problem    HPI [name] is a 46 y.o. male who presents to the Emergency Department complaining of his dialysis port sliding in and out. Patient states that it came out further than it should have so he pushed it back in but now afraid it may not be in the right place. Pt has taped port onto his right chest. Pt has no associated symptoms. Pt denies fever. Next dialysis scheduled in 2 days.   The history is provided by the patient. No language interpreter was used.    Past Medical History:  Diagnosis Date  . Anemia    Iron deficiency  . Aneurysm ([place]) 02/15/2007   Right arm large aneurysm  . Constipation   . End stage renal disease on dialysis ([place])    M- W-F  . GERD (gastroesophageal reflux disease)   . Hemodialysis finding May 2 ,2005   First HD at [facility].  Marland Kitchen History of bronchitis   . Hypertension   . Sickle cell trait [facility])     Patient Active Problem List   Diagnosis Date Noted  . Secondary hyperparathyroidism ([place]) 02/12/2016  . Hyperparathyroidism, secondary ([place]) 02/10/2016  . Closed fracture of left patella 08/24/2015  . Quadriceps tendon rupture 08/24/2015  . Non-healing surgical wound 07/10/2014  . Fever in adult 09/10/2013  . SIRS (systemic inflammatory response syndrome) ([place]) 09/10/2013  . End stage renal disease ([place]) 05/03/2012  . Other complications due to renal dialysis device, implant, and graft 05/03/2012    Past Surgical History:  Procedure Laterality Date  . AV FISTULA PLACEMENT    . AV FISTULA PLACEMENT Left 05/03/2013   Procedure: ARTERIOVENOUS  (AV) FISTULA CREATION;  Surgeon: Rosetta Posner, MD;  Location: [place];  Service: Vascular;  Laterality: Left;  . BASCILIC VEIN TRANSPOSITION Left 11/28/2016   Procedure: BASCILIC VEIN TRANSPOSITION- LEFT ARM;  Surgeon: Rosetta Posner, MD;  Location: [place];  Service: Vascular;  Laterality: Left;  . COLONOSCOPY     Hxl: of  . colonscopy    . FISTULA SUPERFICIALIZATION Left 07/24/2013   Procedure: FISTULA SUPERFICIALIZATION AND LIGATION OF BRANCHES;  Surgeon: Rosetta Posner, MD;  Location: [place];  Service: Vascular;  Laterality: Left;  . FRACTURE SURGERY Left Jul 28, 2015   Petela Tendon and Fx  . INSERTION OF DIALYSIS CATHETER Right 04/22/2013   Procedure: INSERTION OF DIALYSIS CATHETER;  Surgeon: Rosetta Posner, MD;  Location: [place];  Service: Vascular;  Laterality: Right;  . LIGATION OF ARTERIOVENOUS  FISTULA Right 04/22/2013   Procedure: LIGATION OF ARTERIOVENOUS  FISTULA  AND RESECTION OF VENOUS ANEURYSM AND ABSCESS;  Surgeon: Rosetta Posner, MD;  Location: [place];  Service: Vascular;  Laterality: Right;  . PARATHYROIDECTOMY N/A 02/12/2016   Procedure:  TOTAL PARATHYROIDECTOMY AUTOTRANSPLANT TO RIGHT FOREARM ;  Surgeon: Armandina Gemma, MD;  Location: [place];  Service: General;  Laterality: N/A;  . QUADRICEPS TENDON REPAIR Left 08/24/2015   Procedure: REPAIR OF LEFT QUADRICEP TENDON;  Surgeon: Frederik Pear, MD;  Location: [place];  Service: Orthopedics;  Laterality: Left;  . REMOVAL OF A  DIALYSIS CATHETER  2014  . TEE WITHOUT CARDIOVERSION N/A 09/13/2013   Procedure: TRANSESOPHAGEAL ECHOCARDIOGRAM (TEE);  Surgeon: Sanda Klein, MD;  Location: [facility] ENDOSCOPY;  Service: Cardiovascular;  Laterality: N/A;       Home Medications    Prior to Admission medications   Medication Sig Start Date End Date Taking? Authorizing Provider  acetaminophen (TYLENOL) 500 MG tablet Take 1,000 mg by mouth every 8 (eight) hours as needed (pain/headache).     Historical Provider, MD  b complex-vitamin c-folic acid (NEPHRO-VITE) 0.8  MG TABS Take 1 tablet by mouth daily.     Historical Provider, MD  calcitRIOL (ROCALTROL) 0.5 MCG capsule Take 0.5 mcg by mouth 2 (two) times daily.     Historical Provider, MD  calcium elemental as carbonate (BARIATRIC TUMS ULTRA) 400 MG chewable tablet Chew 1,000 mg by mouth 3 (three) times daily.     Historical Provider, MD  midodrine (PROAMATINE) 10 MG tablet Take 10 mg by mouth See admin instructions. Take 1 tablet (10 mg) by mouth prior to dialysis and 1 tablet (10 mg) half way through dialysis - Monday, Wednesday, Friday    Historical Provider, MD  oxyCODONE-acetaminophen (PERCOCET/ROXICET) 5-325 MG tablet Take 1 tablet by mouth every 6 (six) hours as needed. 11/28/16   Alvia Grove, PA-C  promethazine (PHENERGAN) 25 MG tablet Take 12.5-25 mg by mouth every 6 (six) hours as needed for nausea or vomiting.    Historical Provider, MD  ranitidine (ZANTAC) 150 MG capsule Take 150 mg by mouth daily as needed for heartburn. For heartburn    Historical Provider, MD  sucroferric oxyhydroxide (VELPHORO) 500 MG chewable tablet Chew 1,000 mg by mouth 3 (three) times daily with meals.    Historical Provider, MD    Family History Family History  Problem Relation Age of Onset  . Hypertension Mother   . Heart disease Mother     before age 34  . Other Brother     DVT    Social History Social History  Substance Use Topics  . Smoking status: Light Tobacco Smoker    Types: Cigars  . Smokeless tobacco: Never Used     Comment: pt states he smokes about 2 cigars per year  . Alcohol use Yes     Comment: rarely     Allergies   No known allergies and Other   Review of Systems Review of Systems  Constitutional: Negative for fever.  Skin: Positive for wound. Negative for color change.       Dialysis port is loose and sliding.     Physical Exam Updated Vital Signs BP 100/64 (BP Location: Right Arm)   Pulse 103   Temp 98 F (36.7 C) (Oral)   Resp 18   Ht 5\' 11"  (1.803 m)   Wt (!) 145.7  kg   SpO2 100%   BMI 44.81 kg/m   Physical Exam  Constitutional: He is oriented to person, place, and time. He appears well-developed and well-nourished. No distress.  HENT:  Head: Normocephalic.  Eyes: EOM are normal.  Neck: Neck supple.  Cardiovascular: Tachycardia present.   Pulmonary/Chest: Effort normal.  Dialysis catheter right upper chest area. No erythema surrounding the catheter, no red streaking. Small amount of clear drainage from the area.   Neurological: He is alert and oriented to person, place, and time.  Skin: Skin is warm and dry.  Psychiatric: He has a normal mood and affect.  Nursing note and vitals reviewed.  Dr. Johnney Killian in to examine  the patient and discuss x-ray results that show catheter is in the correct position. Discussed need for f/u with Dr. Donnetta Hutching to discuss possible catheter replacement.   ED Treatments / Results  Area surrounding the catheter cleaned with alcohol, dressing applied.   DIAGNOSTIC STUDIES: Oxygen Saturation is 100% on room air, normal by my interpretation.    COORDINATION OF CARE: 9:53 PM Discussed treatment plan with pt at bedside and pt agreed to plan.  Labs (all labs ordered are listed, but only abnormal results are displayed) Labs Reviewed - No data to display  Radiology Dg Chest 2 View  Result Date: 12/10/2016 CLINICAL DATA:  Right chest dialysis catheter placement verification. Shifting of the catheter. History of hypertension, anemia, sickle cell trait, bronchitis. EXAM: CHEST  2 VIEW COMPARISON:  09/09/2013 FINDINGS: Right central venous catheter is in place with tip over the cavoatrial junction region. No pneumothorax. Normal heart size and pulmonary vascularity. No focal airspace disease or consolidation in the lungs. No blunting of costophrenic angles. No pneumothorax. Mediastinal contours appear intact. IMPRESSION: No active cardiopulmonary disease. Right central venous catheter in appropriate position. Electronically  Signed   By: Lucienne Capers M.D.   On: 12/10/2016 21:07    Procedures Procedures (including critical care time)  Medications Ordered in ED Medications - No data to display   Initial Impression / Assessment and Plan / ED Course  I have reviewed the triage vital signs and the nursing notes.  Pertinent imaging results that were available during my care of the patient were reviewed by me and considered in my medical decision making (see chart for details).     Final Clinical Impressions(s) / ED Diagnoses  46 y.o. male here for dialysis catheter check for placement stable for d/c without fever or signs of infection at this time and x-ray that shows correct placement of the catheter.   Final diagnoses:  Mechanical breakdown of vascular dialysis catheter, initial encounter [facility])    New Prescriptions Discharge Medication List as of 12/10/2016 11:03 PM     I personally performed the services described in this documentation, which was scribed in my presence. The recorded information has been reviewed and is accurate.     [address] [place], [place] 12/11/16 0231    Charlesetta Shanks, MD 12/14/16 0900

## 2016-12-10 NOTE — ED Triage Notes (Signed)
Pt presents to ED for assessment of his right sided chest dialysis catheter which he want to change he bandage on tonight and noted it was 1/2 inch out of his body from where it normally is.  He states he then "pushed it back in" and thought "I probably shouldn't have done that".  Patient is wanting assistance with securement and placement verification.  Will order DG Chest to confirm placement.

## 2016-12-10 NOTE — ED Notes (Signed)
Patient transported to X-ray 

## 2016-12-10 NOTE — ED Notes (Addendum)
Dialysis catheter site cleansed with alcohol due to pt's chlorhexidine allergy.  Centurion SorbaView SHIELD, Medium size (3 3/4"x5 7/16") dressing placed using sterile technique.

## 2016-12-10 NOTE — Discharge Instructions (Signed)
Your x-ray tonight shows that the catheter is in the correct position. We have redressed the area. You will need to have it reassessed by your doctor on Monday. If you have problems before then, return here.

## 2016-12-12 DIAGNOSIS — T8249XA Other complication of vascular dialysis catheter, initial encounter: Secondary | ICD-10-CM | POA: Diagnosis not present

## 2016-12-12 DIAGNOSIS — A4902 Methicillin resistant Staphylococcus aureus infection, unspecified site: Secondary | ICD-10-CM | POA: Diagnosis not present

## 2016-12-12 DIAGNOSIS — N2581 Secondary hyperparathyroidism of renal origin: Secondary | ICD-10-CM | POA: Diagnosis not present

## 2016-12-12 DIAGNOSIS — N186 End stage renal disease: Secondary | ICD-10-CM | POA: Diagnosis not present

## 2016-12-13 DIAGNOSIS — Z992 Dependence on renal dialysis: Secondary | ICD-10-CM | POA: Diagnosis not present

## 2016-12-13 DIAGNOSIS — T8249XA Other complication of vascular dialysis catheter, initial encounter: Secondary | ICD-10-CM | POA: Diagnosis not present

## 2016-12-13 DIAGNOSIS — A4902 Methicillin resistant Staphylococcus aureus infection, unspecified site: Secondary | ICD-10-CM | POA: Diagnosis not present

## 2016-12-13 DIAGNOSIS — N186 End stage renal disease: Secondary | ICD-10-CM | POA: Diagnosis not present

## 2016-12-13 DIAGNOSIS — N2581 Secondary hyperparathyroidism of renal origin: Secondary | ICD-10-CM | POA: Diagnosis not present

## 2016-12-14 DIAGNOSIS — T8249XA Other complication of vascular dialysis catheter, initial encounter: Secondary | ICD-10-CM | POA: Diagnosis not present

## 2016-12-14 DIAGNOSIS — N186 End stage renal disease: Secondary | ICD-10-CM | POA: Diagnosis not present

## 2016-12-14 DIAGNOSIS — A4902 Methicillin resistant Staphylococcus aureus infection, unspecified site: Secondary | ICD-10-CM | POA: Diagnosis not present

## 2016-12-14 DIAGNOSIS — N2581 Secondary hyperparathyroidism of renal origin: Secondary | ICD-10-CM | POA: Diagnosis not present

## 2016-12-16 DIAGNOSIS — N2581 Secondary hyperparathyroidism of renal origin: Secondary | ICD-10-CM | POA: Diagnosis not present

## 2016-12-16 DIAGNOSIS — A4902 Methicillin resistant Staphylococcus aureus infection, unspecified site: Secondary | ICD-10-CM | POA: Diagnosis not present

## 2016-12-16 DIAGNOSIS — N186 End stage renal disease: Secondary | ICD-10-CM | POA: Diagnosis not present

## 2016-12-16 DIAGNOSIS — T8249XA Other complication of vascular dialysis catheter, initial encounter: Secondary | ICD-10-CM | POA: Diagnosis not present

## 2016-12-19 DIAGNOSIS — A4902 Methicillin resistant Staphylococcus aureus infection, unspecified site: Secondary | ICD-10-CM | POA: Diagnosis not present

## 2016-12-19 DIAGNOSIS — N2581 Secondary hyperparathyroidism of renal origin: Secondary | ICD-10-CM | POA: Diagnosis not present

## 2016-12-19 DIAGNOSIS — T8249XA Other complication of vascular dialysis catheter, initial encounter: Secondary | ICD-10-CM | POA: Diagnosis not present

## 2016-12-19 DIAGNOSIS — N186 End stage renal disease: Secondary | ICD-10-CM | POA: Diagnosis not present

## 2016-12-21 DIAGNOSIS — I120 Hypertensive chronic kidney disease with stage 5 chronic kidney disease or end stage renal disease: Secondary | ICD-10-CM | POA: Diagnosis not present

## 2016-12-21 DIAGNOSIS — A4902 Methicillin resistant Staphylococcus aureus infection, unspecified site: Secondary | ICD-10-CM | POA: Diagnosis not present

## 2016-12-21 DIAGNOSIS — T8249XA Other complication of vascular dialysis catheter, initial encounter: Secondary | ICD-10-CM | POA: Diagnosis not present

## 2016-12-21 DIAGNOSIS — N186 End stage renal disease: Secondary | ICD-10-CM | POA: Diagnosis not present

## 2016-12-21 DIAGNOSIS — Z992 Dependence on renal dialysis: Secondary | ICD-10-CM | POA: Diagnosis not present

## 2016-12-21 DIAGNOSIS — N2581 Secondary hyperparathyroidism of renal origin: Secondary | ICD-10-CM | POA: Diagnosis not present

## 2016-12-23 DIAGNOSIS — D509 Iron deficiency anemia, unspecified: Secondary | ICD-10-CM | POA: Diagnosis not present

## 2016-12-23 DIAGNOSIS — N2581 Secondary hyperparathyroidism of renal origin: Secondary | ICD-10-CM | POA: Diagnosis not present

## 2016-12-23 DIAGNOSIS — T8249XA Other complication of vascular dialysis catheter, initial encounter: Secondary | ICD-10-CM | POA: Diagnosis not present

## 2016-12-23 DIAGNOSIS — N186 End stage renal disease: Secondary | ICD-10-CM | POA: Diagnosis not present

## 2016-12-26 DIAGNOSIS — N186 End stage renal disease: Secondary | ICD-10-CM | POA: Diagnosis not present

## 2016-12-26 DIAGNOSIS — N2581 Secondary hyperparathyroidism of renal origin: Secondary | ICD-10-CM | POA: Diagnosis not present

## 2016-12-26 DIAGNOSIS — D509 Iron deficiency anemia, unspecified: Secondary | ICD-10-CM | POA: Diagnosis not present

## 2016-12-26 DIAGNOSIS — T8249XA Other complication of vascular dialysis catheter, initial encounter: Secondary | ICD-10-CM | POA: Diagnosis not present

## 2016-12-28 DIAGNOSIS — D509 Iron deficiency anemia, unspecified: Secondary | ICD-10-CM | POA: Diagnosis not present

## 2016-12-28 DIAGNOSIS — N2581 Secondary hyperparathyroidism of renal origin: Secondary | ICD-10-CM | POA: Diagnosis not present

## 2016-12-28 DIAGNOSIS — T8249XA Other complication of vascular dialysis catheter, initial encounter: Secondary | ICD-10-CM | POA: Diagnosis not present

## 2016-12-28 DIAGNOSIS — N186 End stage renal disease: Secondary | ICD-10-CM | POA: Diagnosis not present

## 2016-12-30 ENCOUNTER — Encounter: Payer: Self-pay | Admitting: Vascular Surgery

## 2016-12-30 DIAGNOSIS — D509 Iron deficiency anemia, unspecified: Secondary | ICD-10-CM | POA: Diagnosis not present

## 2016-12-30 DIAGNOSIS — T8249XA Other complication of vascular dialysis catheter, initial encounter: Secondary | ICD-10-CM | POA: Diagnosis not present

## 2016-12-30 DIAGNOSIS — N2581 Secondary hyperparathyroidism of renal origin: Secondary | ICD-10-CM | POA: Diagnosis not present

## 2016-12-30 DIAGNOSIS — N186 End stage renal disease: Secondary | ICD-10-CM | POA: Diagnosis not present

## 2017-01-02 DIAGNOSIS — N186 End stage renal disease: Secondary | ICD-10-CM | POA: Diagnosis not present

## 2017-01-02 DIAGNOSIS — T8249XA Other complication of vascular dialysis catheter, initial encounter: Secondary | ICD-10-CM | POA: Diagnosis not present

## 2017-01-02 DIAGNOSIS — D509 Iron deficiency anemia, unspecified: Secondary | ICD-10-CM | POA: Diagnosis not present

## 2017-01-02 DIAGNOSIS — N2581 Secondary hyperparathyroidism of renal origin: Secondary | ICD-10-CM | POA: Diagnosis not present

## 2017-01-04 DIAGNOSIS — D509 Iron deficiency anemia, unspecified: Secondary | ICD-10-CM | POA: Diagnosis not present

## 2017-01-04 DIAGNOSIS — N2581 Secondary hyperparathyroidism of renal origin: Secondary | ICD-10-CM | POA: Diagnosis not present

## 2017-01-04 DIAGNOSIS — T8249XA Other complication of vascular dialysis catheter, initial encounter: Secondary | ICD-10-CM | POA: Diagnosis not present

## 2017-01-04 DIAGNOSIS — N186 End stage renal disease: Secondary | ICD-10-CM | POA: Diagnosis not present

## 2017-01-06 DIAGNOSIS — T8249XA Other complication of vascular dialysis catheter, initial encounter: Secondary | ICD-10-CM | POA: Diagnosis not present

## 2017-01-06 DIAGNOSIS — D509 Iron deficiency anemia, unspecified: Secondary | ICD-10-CM | POA: Diagnosis not present

## 2017-01-06 DIAGNOSIS — N186 End stage renal disease: Secondary | ICD-10-CM | POA: Diagnosis not present

## 2017-01-06 DIAGNOSIS — N2581 Secondary hyperparathyroidism of renal origin: Secondary | ICD-10-CM | POA: Diagnosis not present

## 2017-01-09 DIAGNOSIS — T8249XA Other complication of vascular dialysis catheter, initial encounter: Secondary | ICD-10-CM | POA: Diagnosis not present

## 2017-01-09 DIAGNOSIS — N186 End stage renal disease: Secondary | ICD-10-CM | POA: Diagnosis not present

## 2017-01-09 DIAGNOSIS — D509 Iron deficiency anemia, unspecified: Secondary | ICD-10-CM | POA: Diagnosis not present

## 2017-01-09 DIAGNOSIS — N2581 Secondary hyperparathyroidism of renal origin: Secondary | ICD-10-CM | POA: Diagnosis not present

## 2017-01-10 ENCOUNTER — Encounter: Payer: Self-pay | Admitting: Vascular Surgery

## 2017-01-10 ENCOUNTER — Ambulatory Visit (INDEPENDENT_AMBULATORY_CARE_PROVIDER_SITE_OTHER): Payer: Self-pay | Admitting: Vascular Surgery

## 2017-01-10 VITALS — BP 110/68 | HR 106 | Temp 97.6°F | Resp 16 | Ht 71.0 in | Wt 323.0 lb

## 2017-01-10 DIAGNOSIS — N186 End stage renal disease: Secondary | ICD-10-CM

## 2017-01-10 NOTE — Progress Notes (Signed)
   Patient name: [name] MRN: [id number] DOB: Dec 09, 1970 Sex: male  REASON FOR VISIT: Follow-up left arm basilic vein transposition fistula on 11/28/2016  HPI: [name] is a 46 y.o. male here today for follow-up. Underwent single stage basilic vein transposition fistula. Does have a hemodialysis catheter which she reports is working without difficulty. He is completely healed his arm incisions and has no steal symptoms.  Current Outpatient Prescriptions  Medication Sig Dispense Refill  . acetaminophen (TYLENOL) 500 MG tablet Take 1,000 mg by mouth every 8 (eight) hours as needed (pain/headache).     Marland Kitchen b complex-vitamin c-folic acid (NEPHRO-VITE) 0.8 MG TABS Take 1 tablet by mouth daily.     . calcitRIOL (ROCALTROL) 0.5 MCG capsule Take 0.5 mcg by mouth 2 (two) times daily.     . calcium elemental as carbonate (BARIATRIC TUMS ULTRA) 400 MG chewable tablet Chew 1,000 mg by mouth 3 (three) times daily.     . midodrine (PROAMATINE) 10 MG tablet Take 10 mg by mouth See admin instructions. Take 1 tablet (10 mg) by mouth prior to dialysis and 1 tablet (10 mg) half way through dialysis - Monday, Wednesday, Friday    . ranitidine (ZANTAC) 150 MG capsule Take 150 mg by mouth daily as needed for heartburn. For heartburn    . sucroferric oxyhydroxide (VELPHORO) 500 MG chewable tablet Chew 1,000 mg by mouth 3 (three) times daily with meals.    Marland Kitchen oxyCODONE-acetaminophen (PERCOCET/ROXICET) 5-325 MG tablet Take 1 tablet by mouth every 6 (six) hours as needed. (Patient not taking: Reported on 01/10/2017) 30 tablet 0  . promethazine (PHENERGAN) 25 MG tablet Take 12.5-25 mg by mouth every 6 (six) hours as needed for nausea or vomiting.     No current facility-administered medications for this visit.      PHYSICAL EXAM: Vitals:   01/10/17 1219  BP: 110/68  Pulse: (!) 106  Resp: 16  Temp: 97.6 F (36.4 C)  SpO2: 96%  Weight: (!) 323 lb (146.5 kg)  Height: 5'  11" (1.803 m)    GENERAL: The patient is a well-nourished male, in no acute distress. The vital signs are documented above. His left arm incisions are all healing quite nicely. He does have excellent thrill in his basilic vein fistula. He does have very good size maturation at this point.  MEDICAL ISSUES: Good Cipriana Biller maturation of his left arm fistula. We'll continue his exercise program. Should be able to use this at 3 months which is Anabia Weatherwax May. We will be available if he has any difficulty with his fistula   Rosetta Posner, MD [facility] Tel [phone] Pager [phone]

## 2017-01-11 DIAGNOSIS — N2581 Secondary hyperparathyroidism of renal origin: Secondary | ICD-10-CM | POA: Diagnosis not present

## 2017-01-11 DIAGNOSIS — T8249XA Other complication of vascular dialysis catheter, initial encounter: Secondary | ICD-10-CM | POA: Diagnosis not present

## 2017-01-11 DIAGNOSIS — N186 End stage renal disease: Secondary | ICD-10-CM | POA: Diagnosis not present

## 2017-01-11 DIAGNOSIS — D509 Iron deficiency anemia, unspecified: Secondary | ICD-10-CM | POA: Diagnosis not present

## 2017-01-13 DIAGNOSIS — N2581 Secondary hyperparathyroidism of renal origin: Secondary | ICD-10-CM | POA: Diagnosis not present

## 2017-01-13 DIAGNOSIS — D509 Iron deficiency anemia, unspecified: Secondary | ICD-10-CM | POA: Diagnosis not present

## 2017-01-13 DIAGNOSIS — N186 End stage renal disease: Secondary | ICD-10-CM | POA: Diagnosis not present

## 2017-01-13 DIAGNOSIS — T8249XA Other complication of vascular dialysis catheter, initial encounter: Secondary | ICD-10-CM | POA: Diagnosis not present

## 2017-01-16 DIAGNOSIS — D509 Iron deficiency anemia, unspecified: Secondary | ICD-10-CM | POA: Diagnosis not present

## 2017-01-16 DIAGNOSIS — N186 End stage renal disease: Secondary | ICD-10-CM | POA: Diagnosis not present

## 2017-01-16 DIAGNOSIS — T8249XA Other complication of vascular dialysis catheter, initial encounter: Secondary | ICD-10-CM | POA: Diagnosis not present

## 2017-01-16 DIAGNOSIS — N2581 Secondary hyperparathyroidism of renal origin: Secondary | ICD-10-CM | POA: Diagnosis not present

## 2017-01-18 DIAGNOSIS — N2581 Secondary hyperparathyroidism of renal origin: Secondary | ICD-10-CM | POA: Diagnosis not present

## 2017-01-18 DIAGNOSIS — T8249XA Other complication of vascular dialysis catheter, initial encounter: Secondary | ICD-10-CM | POA: Diagnosis not present

## 2017-01-18 DIAGNOSIS — D509 Iron deficiency anemia, unspecified: Secondary | ICD-10-CM | POA: Diagnosis not present

## 2017-01-18 DIAGNOSIS — N186 End stage renal disease: Secondary | ICD-10-CM | POA: Diagnosis not present

## 2017-01-20 DIAGNOSIS — D509 Iron deficiency anemia, unspecified: Secondary | ICD-10-CM | POA: Diagnosis not present

## 2017-01-20 DIAGNOSIS — N2581 Secondary hyperparathyroidism of renal origin: Secondary | ICD-10-CM | POA: Diagnosis not present

## 2017-01-20 DIAGNOSIS — N186 End stage renal disease: Secondary | ICD-10-CM | POA: Diagnosis not present

## 2017-01-20 DIAGNOSIS — T8249XA Other complication of vascular dialysis catheter, initial encounter: Secondary | ICD-10-CM | POA: Diagnosis not present

## 2017-01-21 DIAGNOSIS — Z992 Dependence on renal dialysis: Secondary | ICD-10-CM | POA: Diagnosis not present

## 2017-01-21 DIAGNOSIS — I120 Hypertensive chronic kidney disease with stage 5 chronic kidney disease or end stage renal disease: Secondary | ICD-10-CM | POA: Diagnosis not present

## 2017-01-21 DIAGNOSIS — N186 End stage renal disease: Secondary | ICD-10-CM | POA: Diagnosis not present

## 2017-01-23 DIAGNOSIS — N2581 Secondary hyperparathyroidism of renal origin: Secondary | ICD-10-CM | POA: Diagnosis not present

## 2017-01-23 DIAGNOSIS — D631 Anemia in chronic kidney disease: Secondary | ICD-10-CM | POA: Diagnosis not present

## 2017-01-23 DIAGNOSIS — N186 End stage renal disease: Secondary | ICD-10-CM | POA: Diagnosis not present

## 2017-01-23 DIAGNOSIS — T8249XA Other complication of vascular dialysis catheter, initial encounter: Secondary | ICD-10-CM | POA: Diagnosis not present

## 2017-01-25 DIAGNOSIS — D631 Anemia in chronic kidney disease: Secondary | ICD-10-CM | POA: Diagnosis not present

## 2017-01-25 DIAGNOSIS — N2581 Secondary hyperparathyroidism of renal origin: Secondary | ICD-10-CM | POA: Diagnosis not present

## 2017-01-25 DIAGNOSIS — N186 End stage renal disease: Secondary | ICD-10-CM | POA: Diagnosis not present

## 2017-01-25 DIAGNOSIS — T8249XA Other complication of vascular dialysis catheter, initial encounter: Secondary | ICD-10-CM | POA: Diagnosis not present

## 2017-01-27 DIAGNOSIS — T8249XA Other complication of vascular dialysis catheter, initial encounter: Secondary | ICD-10-CM | POA: Diagnosis not present

## 2017-01-27 DIAGNOSIS — D631 Anemia in chronic kidney disease: Secondary | ICD-10-CM | POA: Diagnosis not present

## 2017-01-27 DIAGNOSIS — N186 End stage renal disease: Secondary | ICD-10-CM | POA: Diagnosis not present

## 2017-01-27 DIAGNOSIS — N2581 Secondary hyperparathyroidism of renal origin: Secondary | ICD-10-CM | POA: Diagnosis not present

## 2017-01-30 DIAGNOSIS — D631 Anemia in chronic kidney disease: Secondary | ICD-10-CM | POA: Diagnosis not present

## 2017-01-30 DIAGNOSIS — N186 End stage renal disease: Secondary | ICD-10-CM | POA: Diagnosis not present

## 2017-01-30 DIAGNOSIS — N2581 Secondary hyperparathyroidism of renal origin: Secondary | ICD-10-CM | POA: Diagnosis not present

## 2017-01-30 DIAGNOSIS — T8249XA Other complication of vascular dialysis catheter, initial encounter: Secondary | ICD-10-CM | POA: Diagnosis not present

## 2017-02-01 DIAGNOSIS — T8249XA Other complication of vascular dialysis catheter, initial encounter: Secondary | ICD-10-CM | POA: Diagnosis not present

## 2017-02-01 DIAGNOSIS — N186 End stage renal disease: Secondary | ICD-10-CM | POA: Diagnosis not present

## 2017-02-01 DIAGNOSIS — N2581 Secondary hyperparathyroidism of renal origin: Secondary | ICD-10-CM | POA: Diagnosis not present

## 2017-02-01 DIAGNOSIS — D631 Anemia in chronic kidney disease: Secondary | ICD-10-CM | POA: Diagnosis not present

## 2017-02-03 DIAGNOSIS — N2581 Secondary hyperparathyroidism of renal origin: Secondary | ICD-10-CM | POA: Diagnosis not present

## 2017-02-03 DIAGNOSIS — N186 End stage renal disease: Secondary | ICD-10-CM | POA: Diagnosis not present

## 2017-02-03 DIAGNOSIS — D631 Anemia in chronic kidney disease: Secondary | ICD-10-CM | POA: Diagnosis not present

## 2017-02-03 DIAGNOSIS — T8249XA Other complication of vascular dialysis catheter, initial encounter: Secondary | ICD-10-CM | POA: Diagnosis not present

## 2017-02-06 DIAGNOSIS — D631 Anemia in chronic kidney disease: Secondary | ICD-10-CM | POA: Diagnosis not present

## 2017-02-06 DIAGNOSIS — N2581 Secondary hyperparathyroidism of renal origin: Secondary | ICD-10-CM | POA: Diagnosis not present

## 2017-02-06 DIAGNOSIS — N186 End stage renal disease: Secondary | ICD-10-CM | POA: Diagnosis not present

## 2017-02-06 DIAGNOSIS — T8249XA Other complication of vascular dialysis catheter, initial encounter: Secondary | ICD-10-CM | POA: Diagnosis not present

## 2017-02-08 DIAGNOSIS — N186 End stage renal disease: Secondary | ICD-10-CM | POA: Diagnosis not present

## 2017-02-08 DIAGNOSIS — N2581 Secondary hyperparathyroidism of renal origin: Secondary | ICD-10-CM | POA: Diagnosis not present

## 2017-02-08 DIAGNOSIS — D631 Anemia in chronic kidney disease: Secondary | ICD-10-CM | POA: Diagnosis not present

## 2017-02-08 DIAGNOSIS — T8249XA Other complication of vascular dialysis catheter, initial encounter: Secondary | ICD-10-CM | POA: Diagnosis not present

## 2017-02-10 DIAGNOSIS — N186 End stage renal disease: Secondary | ICD-10-CM | POA: Diagnosis not present

## 2017-02-10 DIAGNOSIS — T8249XA Other complication of vascular dialysis catheter, initial encounter: Secondary | ICD-10-CM | POA: Diagnosis not present

## 2017-02-10 DIAGNOSIS — N2581 Secondary hyperparathyroidism of renal origin: Secondary | ICD-10-CM | POA: Diagnosis not present

## 2017-02-10 DIAGNOSIS — D631 Anemia in chronic kidney disease: Secondary | ICD-10-CM | POA: Diagnosis not present

## 2017-02-13 DIAGNOSIS — T8249XA Other complication of vascular dialysis catheter, initial encounter: Secondary | ICD-10-CM | POA: Diagnosis not present

## 2017-02-13 DIAGNOSIS — N186 End stage renal disease: Secondary | ICD-10-CM | POA: Diagnosis not present

## 2017-02-13 DIAGNOSIS — N2581 Secondary hyperparathyroidism of renal origin: Secondary | ICD-10-CM | POA: Diagnosis not present

## 2017-02-13 DIAGNOSIS — D631 Anemia in chronic kidney disease: Secondary | ICD-10-CM | POA: Diagnosis not present

## 2017-02-15 DIAGNOSIS — N186 End stage renal disease: Secondary | ICD-10-CM | POA: Diagnosis not present

## 2017-02-15 DIAGNOSIS — D631 Anemia in chronic kidney disease: Secondary | ICD-10-CM | POA: Diagnosis not present

## 2017-02-15 DIAGNOSIS — N2581 Secondary hyperparathyroidism of renal origin: Secondary | ICD-10-CM | POA: Diagnosis not present

## 2017-02-15 DIAGNOSIS — T8249XA Other complication of vascular dialysis catheter, initial encounter: Secondary | ICD-10-CM | POA: Diagnosis not present

## 2017-02-17 DIAGNOSIS — N2581 Secondary hyperparathyroidism of renal origin: Secondary | ICD-10-CM | POA: Diagnosis not present

## 2017-02-17 DIAGNOSIS — D631 Anemia in chronic kidney disease: Secondary | ICD-10-CM | POA: Diagnosis not present

## 2017-02-17 DIAGNOSIS — T8249XA Other complication of vascular dialysis catheter, initial encounter: Secondary | ICD-10-CM | POA: Diagnosis not present

## 2017-02-17 DIAGNOSIS — N186 End stage renal disease: Secondary | ICD-10-CM | POA: Diagnosis not present

## 2017-02-19 ENCOUNTER — Encounter (HOSPITAL_COMMUNITY): Payer: Self-pay | Admitting: Emergency Medicine

## 2017-02-19 ENCOUNTER — Emergency Department (HOSPITAL_COMMUNITY)
Admission: EM | Admit: 2017-02-19 | Discharge: 2017-02-20 | Disposition: A | Discharge status: Home / Self Care | Payer: Medicare Other | Attending: Emergency Medicine | Admitting: Emergency Medicine

## 2017-02-19 ENCOUNTER — Emergency Department (HOSPITAL_COMMUNITY): Payer: Medicare Other

## 2017-02-19 DIAGNOSIS — F17290 Nicotine dependence, other tobacco product, uncomplicated: Secondary | ICD-10-CM | POA: Insufficient documentation

## 2017-02-19 DIAGNOSIS — Y733 Surgical instruments, materials and gastroenterology and urology devices (including sutures) associated with adverse incidents: Secondary | ICD-10-CM | POA: Diagnosis not present

## 2017-02-19 DIAGNOSIS — I120 Hypertensive chronic kidney disease with stage 5 chronic kidney disease or end stage renal disease: Secondary | ICD-10-CM | POA: Insufficient documentation

## 2017-02-19 DIAGNOSIS — S20351A Superficial foreign body of right front wall of thorax, initial encounter: Secondary | ICD-10-CM | POA: Diagnosis not present

## 2017-02-19 DIAGNOSIS — N186 End stage renal disease: Secondary | ICD-10-CM | POA: Diagnosis not present

## 2017-02-19 DIAGNOSIS — T8249XA Other complication of vascular dialysis catheter, initial encounter: Secondary | ICD-10-CM | POA: Diagnosis not present

## 2017-02-19 DIAGNOSIS — M795 Residual foreign body in soft tissue: Secondary | ICD-10-CM | POA: Diagnosis not present

## 2017-02-19 DIAGNOSIS — F17210 Nicotine dependence, cigarettes, uncomplicated: Secondary | ICD-10-CM | POA: Diagnosis not present

## 2017-02-19 DIAGNOSIS — N19 Unspecified kidney failure: Secondary | ICD-10-CM | POA: Diagnosis not present

## 2017-02-19 DIAGNOSIS — Z79899 Other long term (current) drug therapy: Secondary | ICD-10-CM | POA: Diagnosis not present

## 2017-02-19 DIAGNOSIS — T8242XA Displacement of vascular dialysis catheter, initial encounter: Secondary | ICD-10-CM | POA: Insufficient documentation

## 2017-02-19 DIAGNOSIS — Z452 Encounter for adjustment and management of vascular access device: Secondary | ICD-10-CM | POA: Diagnosis not present

## 2017-02-19 DIAGNOSIS — T8241XA Breakdown (mechanical) of vascular dialysis catheter, initial encounter: Secondary | ICD-10-CM | POA: Diagnosis not present

## 2017-02-19 DIAGNOSIS — I10 Essential (primary) hypertension: Secondary | ICD-10-CM | POA: Diagnosis not present

## 2017-02-19 NOTE — ED Triage Notes (Signed)
Pt sent from [facility] after [facility] became dislodged from R chest. Pt states he was playing cards @ 2030 and felt tubing in shirt. Pt immediately went to ED, was seen and told to come to [facility] for vascular consult.  Pt has maturing access site to L arm that can be used after May 5th. HD M/W/F Clean dry bandage noted to R chest.

## 2017-02-19 NOTE — ED Provider Notes (Signed)
[facility] DEPT Provider Note   CSN: [id number] Arrival date & time: 02/19/17  2227  By signing my name below, I, Margit Banda, attest that this documentation has been prepared under the direction and in the presence of Veryl Speak, MD. Electronically Signed: Margit Banda, ED Scribe. 02/19/17. 11:37 PM.  History   Chief Complaint Chief Complaint  Patient presents with  . Permacath came out    HPI [name] is a 46 y.o. male who was sent from the [facility] after his permacath became suddenly dislodged from his right chest while he was playing cards ~8:30 pm (02/19/17). Area did not bleed. Pt notes he is not in any pain. Permacath was put in this past February. He has a maturing access site to left arm that can be used after 02/25/2017. Pt denies fever, CP or any other sx at this time.  The history is provided by the patient. No language interpreter was used.    Past Medical History:  Diagnosis Date  . Anemia    Iron deficiency  . Aneurysm ([place]) 02/15/2007   Right arm large aneurysm  . Constipation   . End stage renal disease on dialysis ([place])    M- W-F  . GERD (gastroesophageal reflux disease)   . Hemodialysis finding May 2 ,2005   First HD at [facility].  Marland Kitchen History of bronchitis   . Hypertension   . Sickle cell trait [facility])     Patient Active Problem List   Diagnosis Date Noted  . Secondary hyperparathyroidism ([place]) 02/12/2016  . Hyperparathyroidism, secondary ([place]) 02/10/2016  . Closed fracture of left patella 08/24/2015  . Quadriceps tendon rupture 08/24/2015  . Non-healing surgical wound 07/10/2014  . Fever in adult 09/10/2013  . SIRS (systemic inflammatory response syndrome) ([place]) 09/10/2013  . End stage renal disease ([place]) 05/03/2012  . Other complications due to renal dialysis device, implant, and graft 05/03/2012    Past Surgical History:  Procedure Laterality Date  . AV FISTULA PLACEMENT    . AV FISTULA PLACEMENT Left 05/03/2013   Procedure: ARTERIOVENOUS (AV) FISTULA CREATION;  Surgeon: Rosetta Posner, MD;  Location: [place];  Service: Vascular;  Laterality: Left;  . BASCILIC VEIN TRANSPOSITION Left 11/28/2016   Procedure: BASCILIC VEIN TRANSPOSITION- LEFT ARM;  Surgeon: Rosetta Posner, MD;  Location: [place];  Service: Vascular;  Laterality: Left;  . COLONOSCOPY     Hxl: of  . colonscopy    . FISTULA SUPERFICIALIZATION Left 07/24/2013   Procedure: FISTULA SUPERFICIALIZATION AND LIGATION OF BRANCHES;  Surgeon: Rosetta Posner, MD;  Location: [place];  Service: Vascular;  Laterality: Left;  . FRACTURE SURGERY Left Jul 28, 2015   Petela Tendon and Fx  . INSERTION OF DIALYSIS CATHETER Right 04/22/2013   Procedure: INSERTION OF DIALYSIS CATHETER;  Surgeon: Rosetta Posner, MD;  Location: [place];  Service: Vascular;  Laterality: Right;  . LIGATION OF ARTERIOVENOUS  FISTULA Right 04/22/2013   Procedure: LIGATION OF ARTERIOVENOUS  FISTULA  AND RESECTION OF VENOUS ANEURYSM AND ABSCESS;  Surgeon: Rosetta Posner, MD;  Location: [place];  Service: Vascular;  Laterality: Right;  . PARATHYROIDECTOMY N/A 02/12/2016   Procedure:  TOTAL PARATHYROIDECTOMY AUTOTRANSPLANT TO RIGHT FOREARM ;  Surgeon: Armandina Gemma, MD;  Location: [place];  Service: General;  Laterality: N/A;  . QUADRICEPS TENDON REPAIR Left 08/24/2015   Procedure: REPAIR OF LEFT QUADRICEP TENDON;  Surgeon: Frederik Pear, MD;  Location: [place];  Service: Orthopedics;  Laterality: Left;  .  REMOVAL OF A DIALYSIS CATHETER  2014  . TEE WITHOUT CARDIOVERSION N/A 09/13/2013   Procedure: TRANSESOPHAGEAL ECHOCARDIOGRAM (TEE);  Surgeon: Sanda Klein, MD;  Location: [facility] ENDOSCOPY;  Service: Cardiovascular;  Laterality: N/A;       Home Medications    Prior to Admission medications   Medication Sig Start Date End Date Taking? Authorizing Provider  acetaminophen (TYLENOL) 500 MG tablet Take 1,000 mg by mouth every 8 (eight) hours as needed (pain/headache).     Historical Provider, MD  b complex-vitamin c-folic  acid (NEPHRO-VITE) 0.8 MG TABS Take 1 tablet by mouth daily.     Historical Provider, MD  calcitRIOL (ROCALTROL) 0.5 MCG capsule Take 0.5 mcg by mouth 2 (two) times daily.     Historical Provider, MD  calcium elemental as carbonate (BARIATRIC TUMS ULTRA) 400 MG chewable tablet Chew 1,000 mg by mouth 3 (three) times daily.     Historical Provider, MD  midodrine (PROAMATINE) 10 MG tablet Take 10 mg by mouth See admin instructions. Take 1 tablet (10 mg) by mouth prior to dialysis and 1 tablet (10 mg) half way through dialysis - Monday, Wednesday, Friday    Historical Provider, MD  oxyCODONE-acetaminophen (PERCOCET/ROXICET) 5-325 MG tablet Take 1 tablet by mouth every 6 (six) hours as needed. Patient not taking: Reported on 01/10/2017 11/28/16   Alvia Grove, PA-C  promethazine (PHENERGAN) 25 MG tablet Take 12.5-25 mg by mouth every 6 (six) hours as needed for nausea or vomiting.    Historical Provider, MD  ranitidine (ZANTAC) 150 MG capsule Take 150 mg by mouth daily as needed for heartburn. For heartburn    Historical Provider, MD  sucroferric oxyhydroxide (VELPHORO) 500 MG chewable tablet Chew 1,000 mg by mouth 3 (three) times daily with meals.    Historical Provider, MD    Family History Family History  Problem Relation Age of Onset  . Hypertension Mother   . Heart disease Mother     before age 83  . Other Brother     DVT    Social History Social History  Substance Use Topics  . Smoking status: Light Tobacco Smoker    Types: Cigars  . Smokeless tobacco: Never Used     Comment: pt states he smokes about 2 cigars per year  . Alcohol use Yes     Comment: rarely     Allergies   No known allergies and Other   Review of Systems Review of Systems  Constitutional: Negative for fever.  Cardiovascular: Negative for chest pain.  All other systems reviewed and are negative.    Physical Exam Updated Vital Signs BP (!) 94/58   Pulse 98   Temp 97.9 F (36.6 C) (Oral)   Resp 18    Ht 5\' 11"  (1.803 m)   Wt (!) 310 lb (140.6 kg)   SpO2 97%   BMI 43.24 kg/m   Physical Exam  Constitutional: He is oriented to person, place, and time. He appears well-developed and well-nourished. No distress.  HENT:  Head: Normocephalic and atraumatic.  Right Ear: Hearing normal.  Left Ear: Hearing normal.  Nose: Nose normal.  Mouth/Throat: Oropharynx is clear and moist and mucous membranes are normal.  Eyes: Conjunctivae and EOM are normal. Pupils are equal, round, and reactive to light.  Neck: Normal range of motion. Neck supple.  Cardiovascular: Regular rhythm, S1 normal and S2 normal.  Exam reveals no gallop and no friction rub.   No murmur heard. Pulmonary/Chest: Effort normal and breath sounds normal. No  respiratory distress. He exhibits no tenderness.  Abdominal: Soft. Normal appearance and bowel sounds are normal. There is no hepatosplenomegaly. There is no tenderness. There is no rebound, no guarding, no tenderness at McBurney's point and negative Murphy's sign. No hernia.  Musculoskeletal: Normal range of motion.  Neurological: He is alert and oriented to person, place, and time. He has normal strength. No cranial nerve deficit or sensory deficit. Coordination normal. GCS eye subscore is 4. GCS verbal subscore is 5. GCS motor subscore is 6.  Skin: Skin is warm, dry and intact. No rash noted. No cyanosis.  Psychiatric: He has a normal mood and affect. His speech is normal and behavior is normal. Thought content normal.  Nursing note and vitals reviewed.    ED Treatments / Results  DIAGNOSTIC STUDIES: Oxygen Saturation is 97% on RA, normal by my interpretation.   COORDINATION OF CARE: 11:30 PM-Discussed next steps with pt. Pt verbalized understanding and is agreeable with the plan.    Labs (all labs ordered are listed, but only abnormal results are displayed) Labs Reviewed  I-STAT CHEM 8, ED    EKG  EKG Interpretation None       Radiology No results  found.  Procedures Procedures (including critical care time)  Medications Ordered in ED Medications - No data to display   Initial Impression / Assessment and Plan / ED Course  I have reviewed the triage vital signs and the nursing notes.  Pertinent labs & imaging results that were available during my care of the patient were reviewed by me and considered in my medical decision making (see chart for details).  Patient with history of end-stage renal disease on hemodialysis. He presents today with a permacath dislodgment. He is due for dialysis early tomorrow morning. He does not appear volume overloaded. There is no hypoxia and no evidence for CHF on x-ray. His laboratory studies do not show acidosis or hyperkalemia. I see no indication for emergent dialysis. I did discuss this with Dr. Scot Dock from vascular surgery. He is recommending follow-up with Dr. Augustin Coupe in the morning to arrange catheter replacement tomorrow. He will be discharged, to follow-up with Dr. Augustin Coupe tomorrow.  Final Clinical Impressions(s) / ED Diagnoses   Final diagnoses:  None    New Prescriptions New Prescriptions   No medications on file   I personally performed the services described in this documentation, which was scribed in my presence. The recorded information has been reviewed and is accurate.       Veryl Speak, MD 02/20/17 [phone]

## 2017-02-20 DIAGNOSIS — N186 End stage renal disease: Secondary | ICD-10-CM | POA: Diagnosis not present

## 2017-02-20 DIAGNOSIS — Z992 Dependence on renal dialysis: Secondary | ICD-10-CM | POA: Diagnosis not present

## 2017-02-20 DIAGNOSIS — T8249XA Other complication of vascular dialysis catheter, initial encounter: Secondary | ICD-10-CM | POA: Diagnosis not present

## 2017-02-20 DIAGNOSIS — D631 Anemia in chronic kidney disease: Secondary | ICD-10-CM | POA: Diagnosis not present

## 2017-02-20 DIAGNOSIS — I120 Hypertensive chronic kidney disease with stage 5 chronic kidney disease or end stage renal disease: Secondary | ICD-10-CM | POA: Diagnosis not present

## 2017-02-20 DIAGNOSIS — N2581 Secondary hyperparathyroidism of renal origin: Secondary | ICD-10-CM | POA: Diagnosis not present

## 2017-02-20 LAB — I-STAT CHEM 8, ED
BUN: 48 mg/dL — ABNORMAL HIGH (ref 6–20)
CHLORIDE: 101 mmol/L (ref 101–111)
Calcium, Ion: 1.14 mmol/L — ABNORMAL LOW (ref 1.15–1.40)
Creatinine, Ser: 16.5 mg/dL — ABNORMAL HIGH (ref 0.61–1.24)
GLUCOSE: 99 mg/dL (ref 65–99)
HCT: 46 % (ref 39.0–52.0)
HEMOGLOBIN: 15.6 g/dL (ref 13.0–17.0)
POTASSIUM: 5.1 mmol/L (ref 3.5–5.1)
SODIUM: 133 mmol/L — AB (ref 135–145)
TCO2: 24 mmol/L (ref 0–100)

## 2017-02-20 NOTE — Discharge Instructions (Signed)
Follow-up with Dr. Augustin Coupe when his office opens in the morning to arrange replacement of your dialysis catheter.

## 2017-02-22 DIAGNOSIS — N2581 Secondary hyperparathyroidism of renal origin: Secondary | ICD-10-CM | POA: Diagnosis not present

## 2017-02-22 DIAGNOSIS — N186 End stage renal disease: Secondary | ICD-10-CM | POA: Diagnosis not present

## 2017-02-22 DIAGNOSIS — E8770 Fluid overload, unspecified: Secondary | ICD-10-CM | POA: Diagnosis not present

## 2017-02-24 DIAGNOSIS — N2581 Secondary hyperparathyroidism of renal origin: Secondary | ICD-10-CM | POA: Diagnosis not present

## 2017-02-24 DIAGNOSIS — E8770 Fluid overload, unspecified: Secondary | ICD-10-CM | POA: Diagnosis not present

## 2017-02-24 DIAGNOSIS — N186 End stage renal disease: Secondary | ICD-10-CM | POA: Diagnosis not present

## 2017-02-27 DIAGNOSIS — N2581 Secondary hyperparathyroidism of renal origin: Secondary | ICD-10-CM | POA: Diagnosis not present

## 2017-02-27 DIAGNOSIS — N186 End stage renal disease: Secondary | ICD-10-CM | POA: Diagnosis not present

## 2017-02-27 DIAGNOSIS — E8770 Fluid overload, unspecified: Secondary | ICD-10-CM | POA: Diagnosis not present

## 2017-03-01 DIAGNOSIS — N2581 Secondary hyperparathyroidism of renal origin: Secondary | ICD-10-CM | POA: Diagnosis not present

## 2017-03-01 DIAGNOSIS — N186 End stage renal disease: Secondary | ICD-10-CM | POA: Diagnosis not present

## 2017-03-01 DIAGNOSIS — E8770 Fluid overload, unspecified: Secondary | ICD-10-CM | POA: Diagnosis not present

## 2017-03-03 DIAGNOSIS — E8770 Fluid overload, unspecified: Secondary | ICD-10-CM | POA: Diagnosis not present

## 2017-03-03 DIAGNOSIS — N2581 Secondary hyperparathyroidism of renal origin: Secondary | ICD-10-CM | POA: Diagnosis not present

## 2017-03-03 DIAGNOSIS — N186 End stage renal disease: Secondary | ICD-10-CM | POA: Diagnosis not present

## 2017-03-06 DIAGNOSIS — E8770 Fluid overload, unspecified: Secondary | ICD-10-CM | POA: Diagnosis not present

## 2017-03-06 DIAGNOSIS — N2581 Secondary hyperparathyroidism of renal origin: Secondary | ICD-10-CM | POA: Diagnosis not present

## 2017-03-06 DIAGNOSIS — N186 End stage renal disease: Secondary | ICD-10-CM | POA: Diagnosis not present

## 2017-03-07 DIAGNOSIS — N2581 Secondary hyperparathyroidism of renal origin: Secondary | ICD-10-CM | POA: Diagnosis not present

## 2017-03-07 DIAGNOSIS — E8770 Fluid overload, unspecified: Secondary | ICD-10-CM | POA: Diagnosis not present

## 2017-03-07 DIAGNOSIS — N186 End stage renal disease: Secondary | ICD-10-CM | POA: Diagnosis not present

## 2017-03-08 DIAGNOSIS — N2581 Secondary hyperparathyroidism of renal origin: Secondary | ICD-10-CM | POA: Diagnosis not present

## 2017-03-08 DIAGNOSIS — N186 End stage renal disease: Secondary | ICD-10-CM | POA: Diagnosis not present

## 2017-03-08 DIAGNOSIS — E8770 Fluid overload, unspecified: Secondary | ICD-10-CM | POA: Diagnosis not present

## 2017-03-08 IMAGING — DX DG CHEST 2V
2 series · 2 of 2 positions shown · non-contrast
Comparison: 09/09/2013

CLINICAL DATA: Right chest dialysis catheter placement
verification. Shifting of the catheter. History of hypertension,
anemia, sickle cell trait, bronchitis.

EXAM:
CHEST  2 VIEW

[chest pa]
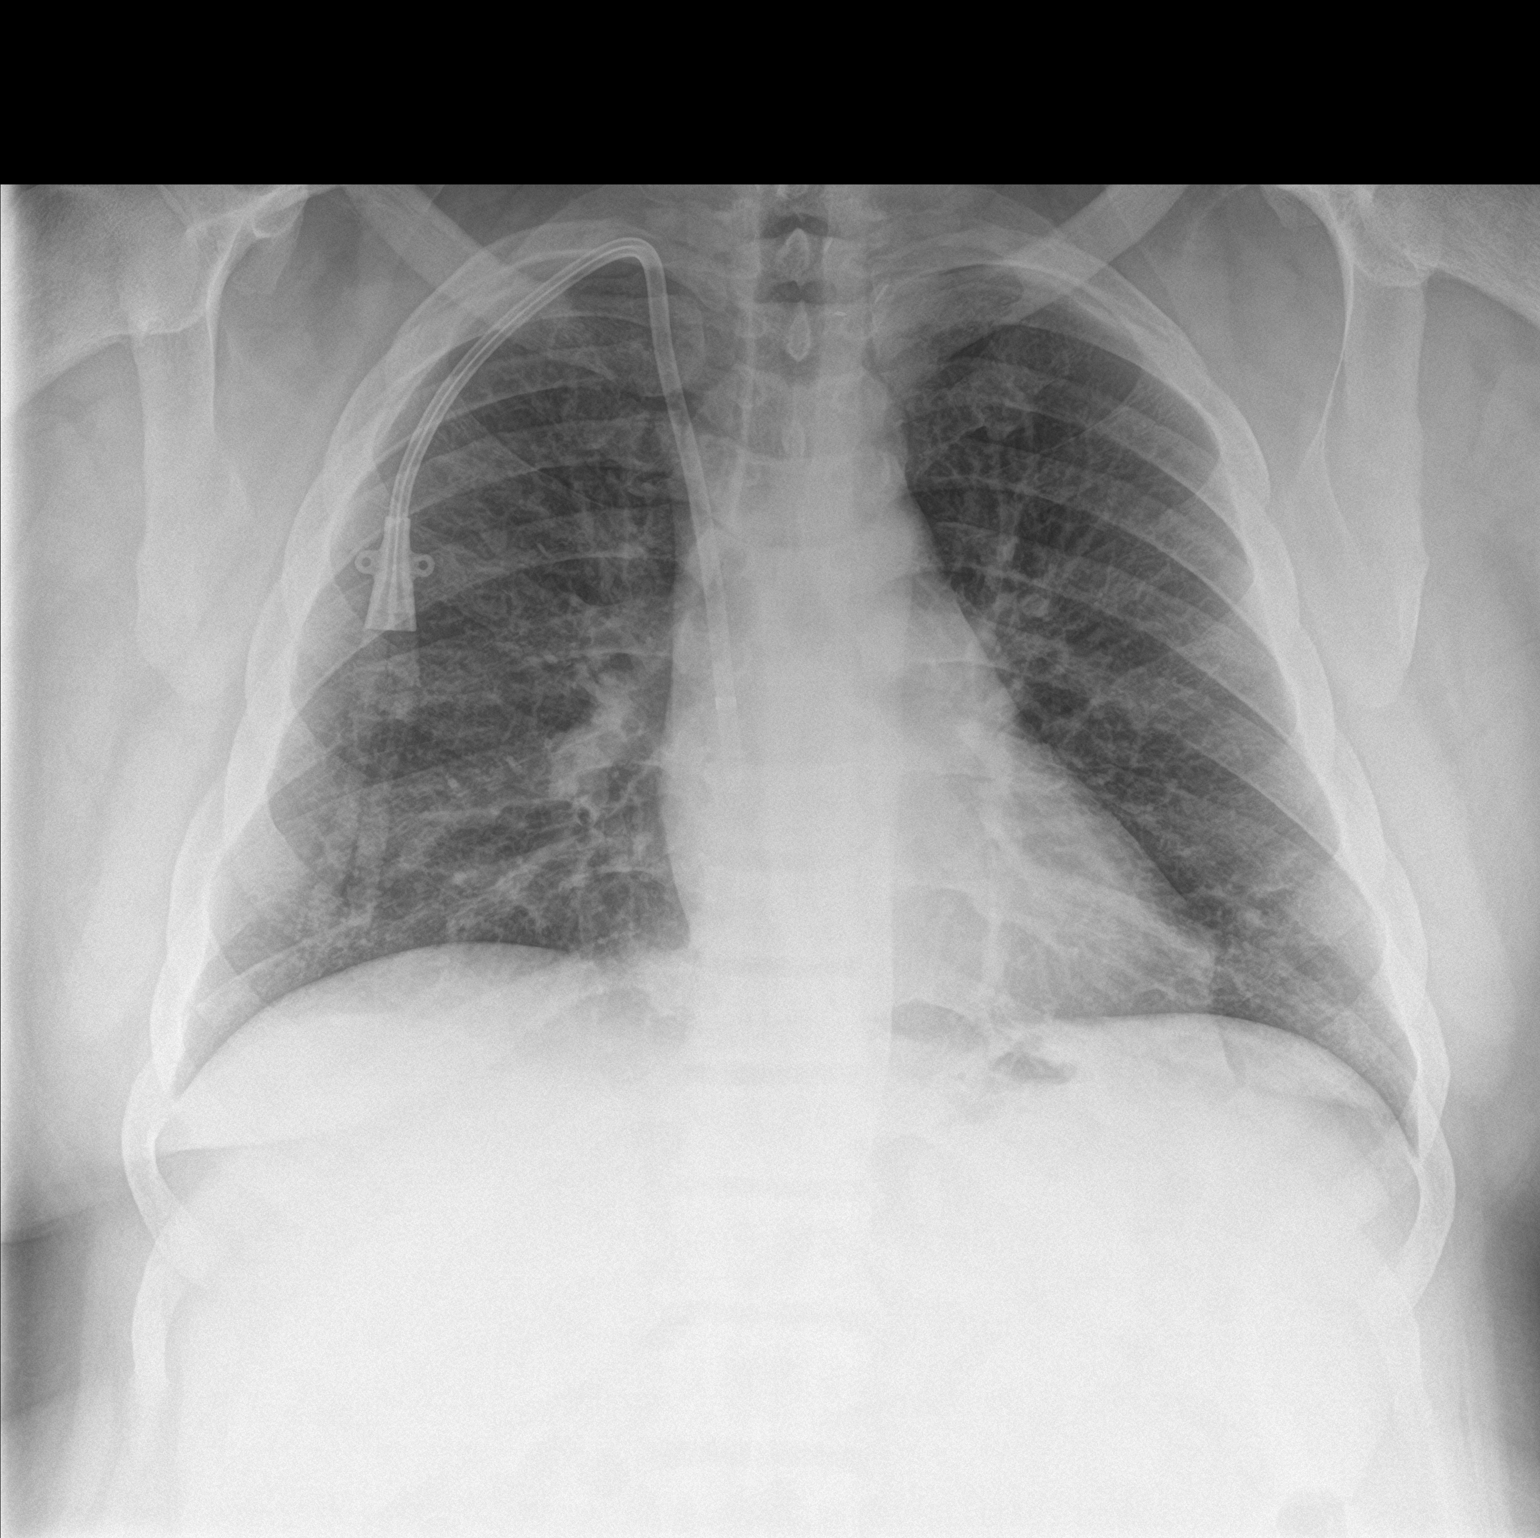

[chest lat]
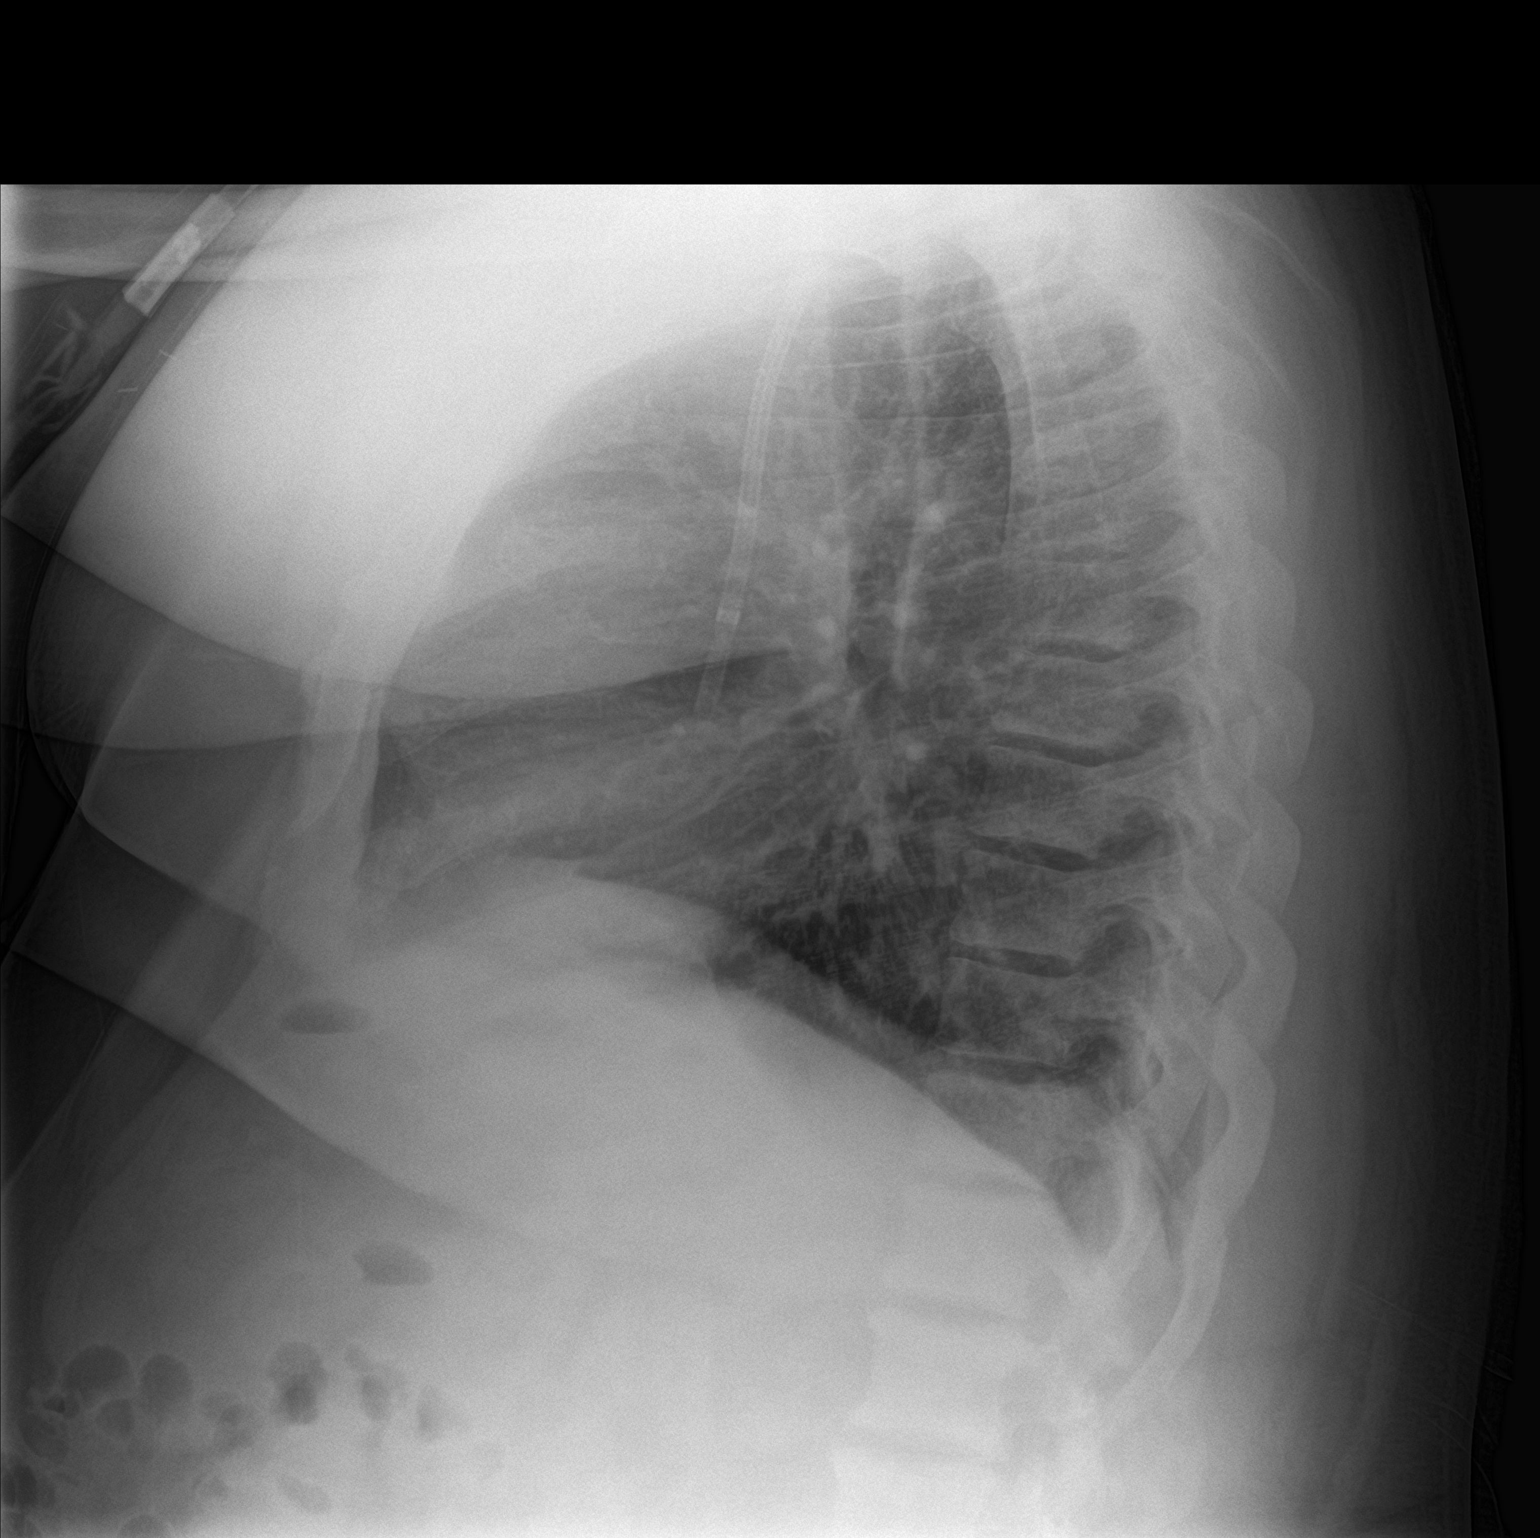

[2 of 2 positions shown; findings below may reference images not displayed]

FINDINGS: Right central venous catheter is in place with tip over the
cavoatrial junction region. No pneumothorax. Normal heart size and
pulmonary vascularity. No focal airspace disease or consolidation in
the lungs. No blunting of costophrenic angles. No pneumothorax.
Mediastinal contours appear intact.
IMPRESSION: No active cardiopulmonary disease. Right central venous catheter in
appropriate position.

## 2017-03-09 DIAGNOSIS — N186 End stage renal disease: Secondary | ICD-10-CM | POA: Diagnosis not present

## 2017-03-09 DIAGNOSIS — T82858A Stenosis of vascular prosthetic devices, implants and grafts, initial encounter: Secondary | ICD-10-CM | POA: Diagnosis not present

## 2017-03-09 DIAGNOSIS — I871 Compression of vein: Secondary | ICD-10-CM | POA: Diagnosis not present

## 2017-03-09 DIAGNOSIS — Z992 Dependence on renal dialysis: Secondary | ICD-10-CM | POA: Diagnosis not present

## 2017-03-10 DIAGNOSIS — E8770 Fluid overload, unspecified: Secondary | ICD-10-CM | POA: Diagnosis not present

## 2017-03-10 DIAGNOSIS — N186 End stage renal disease: Secondary | ICD-10-CM | POA: Diagnosis not present

## 2017-03-10 DIAGNOSIS — N2581 Secondary hyperparathyroidism of renal origin: Secondary | ICD-10-CM | POA: Diagnosis not present

## 2017-03-13 DIAGNOSIS — N2581 Secondary hyperparathyroidism of renal origin: Secondary | ICD-10-CM | POA: Diagnosis not present

## 2017-03-13 DIAGNOSIS — E8770 Fluid overload, unspecified: Secondary | ICD-10-CM | POA: Diagnosis not present

## 2017-03-13 DIAGNOSIS — N186 End stage renal disease: Secondary | ICD-10-CM | POA: Diagnosis not present

## 2017-03-15 DIAGNOSIS — N2581 Secondary hyperparathyroidism of renal origin: Secondary | ICD-10-CM | POA: Diagnosis not present

## 2017-03-15 DIAGNOSIS — E8770 Fluid overload, unspecified: Secondary | ICD-10-CM | POA: Diagnosis not present

## 2017-03-15 DIAGNOSIS — N186 End stage renal disease: Secondary | ICD-10-CM | POA: Diagnosis not present

## 2017-03-17 DIAGNOSIS — N186 End stage renal disease: Secondary | ICD-10-CM | POA: Diagnosis not present

## 2017-03-17 DIAGNOSIS — E8770 Fluid overload, unspecified: Secondary | ICD-10-CM | POA: Diagnosis not present

## 2017-03-17 DIAGNOSIS — N2581 Secondary hyperparathyroidism of renal origin: Secondary | ICD-10-CM | POA: Diagnosis not present

## 2017-03-20 DIAGNOSIS — N186 End stage renal disease: Secondary | ICD-10-CM | POA: Diagnosis not present

## 2017-03-20 DIAGNOSIS — E8770 Fluid overload, unspecified: Secondary | ICD-10-CM | POA: Diagnosis not present

## 2017-03-20 DIAGNOSIS — N2581 Secondary hyperparathyroidism of renal origin: Secondary | ICD-10-CM | POA: Diagnosis not present

## 2017-03-22 DIAGNOSIS — E8770 Fluid overload, unspecified: Secondary | ICD-10-CM | POA: Diagnosis not present

## 2017-03-22 DIAGNOSIS — N2581 Secondary hyperparathyroidism of renal origin: Secondary | ICD-10-CM | POA: Diagnosis not present

## 2017-03-22 DIAGNOSIS — N186 End stage renal disease: Secondary | ICD-10-CM | POA: Diagnosis not present

## 2017-03-23 DIAGNOSIS — N186 End stage renal disease: Secondary | ICD-10-CM | POA: Diagnosis not present

## 2017-03-23 DIAGNOSIS — I120 Hypertensive chronic kidney disease with stage 5 chronic kidney disease or end stage renal disease: Secondary | ICD-10-CM | POA: Diagnosis not present

## 2017-03-23 DIAGNOSIS — Z992 Dependence on renal dialysis: Secondary | ICD-10-CM | POA: Diagnosis not present

## 2017-03-24 DIAGNOSIS — N186 End stage renal disease: Secondary | ICD-10-CM | POA: Diagnosis not present

## 2017-03-24 DIAGNOSIS — N2581 Secondary hyperparathyroidism of renal origin: Secondary | ICD-10-CM | POA: Diagnosis not present

## 2017-03-27 DIAGNOSIS — N186 End stage renal disease: Secondary | ICD-10-CM | POA: Diagnosis not present

## 2017-03-27 DIAGNOSIS — N2581 Secondary hyperparathyroidism of renal origin: Secondary | ICD-10-CM | POA: Diagnosis not present

## 2017-03-29 DIAGNOSIS — N2581 Secondary hyperparathyroidism of renal origin: Secondary | ICD-10-CM | POA: Diagnosis not present

## 2017-03-29 DIAGNOSIS — N186 End stage renal disease: Secondary | ICD-10-CM | POA: Diagnosis not present

## 2017-03-31 DIAGNOSIS — N2581 Secondary hyperparathyroidism of renal origin: Secondary | ICD-10-CM | POA: Diagnosis not present

## 2017-03-31 DIAGNOSIS — N186 End stage renal disease: Secondary | ICD-10-CM | POA: Diagnosis not present

## 2017-04-03 DIAGNOSIS — N186 End stage renal disease: Secondary | ICD-10-CM | POA: Diagnosis not present

## 2017-04-03 DIAGNOSIS — N2581 Secondary hyperparathyroidism of renal origin: Secondary | ICD-10-CM | POA: Diagnosis not present

## 2017-04-05 DIAGNOSIS — N186 End stage renal disease: Secondary | ICD-10-CM | POA: Diagnosis not present

## 2017-04-05 DIAGNOSIS — N2581 Secondary hyperparathyroidism of renal origin: Secondary | ICD-10-CM | POA: Diagnosis not present

## 2017-04-07 DIAGNOSIS — N2581 Secondary hyperparathyroidism of renal origin: Secondary | ICD-10-CM | POA: Diagnosis not present

## 2017-04-07 DIAGNOSIS — N186 End stage renal disease: Secondary | ICD-10-CM | POA: Diagnosis not present

## 2017-04-10 DIAGNOSIS — N2581 Secondary hyperparathyroidism of renal origin: Secondary | ICD-10-CM | POA: Diagnosis not present

## 2017-04-10 DIAGNOSIS — N186 End stage renal disease: Secondary | ICD-10-CM | POA: Diagnosis not present

## 2017-04-12 DIAGNOSIS — N186 End stage renal disease: Secondary | ICD-10-CM | POA: Diagnosis not present

## 2017-04-12 DIAGNOSIS — N2581 Secondary hyperparathyroidism of renal origin: Secondary | ICD-10-CM | POA: Diagnosis not present

## 2017-04-14 DIAGNOSIS — N186 End stage renal disease: Secondary | ICD-10-CM | POA: Diagnosis not present

## 2017-04-14 DIAGNOSIS — N2581 Secondary hyperparathyroidism of renal origin: Secondary | ICD-10-CM | POA: Diagnosis not present

## 2017-04-17 DIAGNOSIS — N186 End stage renal disease: Secondary | ICD-10-CM | POA: Diagnosis not present

## 2017-04-17 DIAGNOSIS — N2581 Secondary hyperparathyroidism of renal origin: Secondary | ICD-10-CM | POA: Diagnosis not present

## 2017-04-19 DIAGNOSIS — N186 End stage renal disease: Secondary | ICD-10-CM | POA: Diagnosis not present

## 2017-04-19 DIAGNOSIS — N2581 Secondary hyperparathyroidism of renal origin: Secondary | ICD-10-CM | POA: Diagnosis not present

## 2017-04-21 DIAGNOSIS — N2581 Secondary hyperparathyroidism of renal origin: Secondary | ICD-10-CM | POA: Diagnosis not present

## 2017-04-21 DIAGNOSIS — N186 End stage renal disease: Secondary | ICD-10-CM | POA: Diagnosis not present

## 2017-04-22 DIAGNOSIS — Z992 Dependence on renal dialysis: Secondary | ICD-10-CM | POA: Diagnosis not present

## 2017-04-22 DIAGNOSIS — N186 End stage renal disease: Secondary | ICD-10-CM | POA: Diagnosis not present

## 2017-04-22 DIAGNOSIS — I120 Hypertensive chronic kidney disease with stage 5 chronic kidney disease or end stage renal disease: Secondary | ICD-10-CM | POA: Diagnosis not present

## 2017-04-24 DIAGNOSIS — E8770 Fluid overload, unspecified: Secondary | ICD-10-CM | POA: Diagnosis not present

## 2017-04-24 DIAGNOSIS — N186 End stage renal disease: Secondary | ICD-10-CM | POA: Diagnosis not present

## 2017-04-24 DIAGNOSIS — N2581 Secondary hyperparathyroidism of renal origin: Secondary | ICD-10-CM | POA: Diagnosis not present

## 2017-04-26 DIAGNOSIS — E8770 Fluid overload, unspecified: Secondary | ICD-10-CM | POA: Diagnosis not present

## 2017-04-26 DIAGNOSIS — N186 End stage renal disease: Secondary | ICD-10-CM | POA: Diagnosis not present

## 2017-04-26 DIAGNOSIS — N2581 Secondary hyperparathyroidism of renal origin: Secondary | ICD-10-CM | POA: Diagnosis not present

## 2017-04-28 DIAGNOSIS — N2581 Secondary hyperparathyroidism of renal origin: Secondary | ICD-10-CM | POA: Diagnosis not present

## 2017-04-28 DIAGNOSIS — N186 End stage renal disease: Secondary | ICD-10-CM | POA: Diagnosis not present

## 2017-04-28 DIAGNOSIS — E8770 Fluid overload, unspecified: Secondary | ICD-10-CM | POA: Diagnosis not present

## 2017-04-29 DIAGNOSIS — E8770 Fluid overload, unspecified: Secondary | ICD-10-CM | POA: Diagnosis not present

## 2017-04-29 DIAGNOSIS — N186 End stage renal disease: Secondary | ICD-10-CM | POA: Diagnosis not present

## 2017-04-29 DIAGNOSIS — N2581 Secondary hyperparathyroidism of renal origin: Secondary | ICD-10-CM | POA: Diagnosis not present

## 2017-05-01 DIAGNOSIS — N2581 Secondary hyperparathyroidism of renal origin: Secondary | ICD-10-CM | POA: Diagnosis not present

## 2017-05-01 DIAGNOSIS — E8770 Fluid overload, unspecified: Secondary | ICD-10-CM | POA: Diagnosis not present

## 2017-05-01 DIAGNOSIS — N186 End stage renal disease: Secondary | ICD-10-CM | POA: Diagnosis not present

## 2017-05-03 DIAGNOSIS — E8770 Fluid overload, unspecified: Secondary | ICD-10-CM | POA: Diagnosis not present

## 2017-05-03 DIAGNOSIS — N186 End stage renal disease: Secondary | ICD-10-CM | POA: Diagnosis not present

## 2017-05-03 DIAGNOSIS — N2581 Secondary hyperparathyroidism of renal origin: Secondary | ICD-10-CM | POA: Diagnosis not present

## 2017-05-05 DIAGNOSIS — N2581 Secondary hyperparathyroidism of renal origin: Secondary | ICD-10-CM | POA: Diagnosis not present

## 2017-05-05 DIAGNOSIS — N186 End stage renal disease: Secondary | ICD-10-CM | POA: Diagnosis not present

## 2017-05-05 DIAGNOSIS — E8770 Fluid overload, unspecified: Secondary | ICD-10-CM | POA: Diagnosis not present

## 2017-05-08 DIAGNOSIS — N186 End stage renal disease: Secondary | ICD-10-CM | POA: Diagnosis not present

## 2017-05-08 DIAGNOSIS — E8770 Fluid overload, unspecified: Secondary | ICD-10-CM | POA: Diagnosis not present

## 2017-05-08 DIAGNOSIS — N2581 Secondary hyperparathyroidism of renal origin: Secondary | ICD-10-CM | POA: Diagnosis not present

## 2017-05-10 DIAGNOSIS — N2581 Secondary hyperparathyroidism of renal origin: Secondary | ICD-10-CM | POA: Diagnosis not present

## 2017-05-10 DIAGNOSIS — N186 End stage renal disease: Secondary | ICD-10-CM | POA: Diagnosis not present

## 2017-05-10 DIAGNOSIS — E8770 Fluid overload, unspecified: Secondary | ICD-10-CM | POA: Diagnosis not present

## 2017-05-12 DIAGNOSIS — E8770 Fluid overload, unspecified: Secondary | ICD-10-CM | POA: Diagnosis not present

## 2017-05-12 DIAGNOSIS — N2581 Secondary hyperparathyroidism of renal origin: Secondary | ICD-10-CM | POA: Diagnosis not present

## 2017-05-12 DIAGNOSIS — N186 End stage renal disease: Secondary | ICD-10-CM | POA: Diagnosis not present

## 2017-05-15 DIAGNOSIS — N2581 Secondary hyperparathyroidism of renal origin: Secondary | ICD-10-CM | POA: Diagnosis not present

## 2017-05-15 DIAGNOSIS — N186 End stage renal disease: Secondary | ICD-10-CM | POA: Diagnosis not present

## 2017-05-15 DIAGNOSIS — E8770 Fluid overload, unspecified: Secondary | ICD-10-CM | POA: Diagnosis not present

## 2017-05-17 DIAGNOSIS — N2581 Secondary hyperparathyroidism of renal origin: Secondary | ICD-10-CM | POA: Diagnosis not present

## 2017-05-17 DIAGNOSIS — E8770 Fluid overload, unspecified: Secondary | ICD-10-CM | POA: Diagnosis not present

## 2017-05-17 DIAGNOSIS — N186 End stage renal disease: Secondary | ICD-10-CM | POA: Diagnosis not present

## 2017-05-18 IMAGING — CR DG CHEST 2V
2 series · 2 of 2 positions shown · non-contrast
Comparison: 12/10/2016

CLINICAL DATA: Dialysis catheter fell out. Question a retained
fragment.

EXAM:
CHEST  2 VIEW

[chest pa]
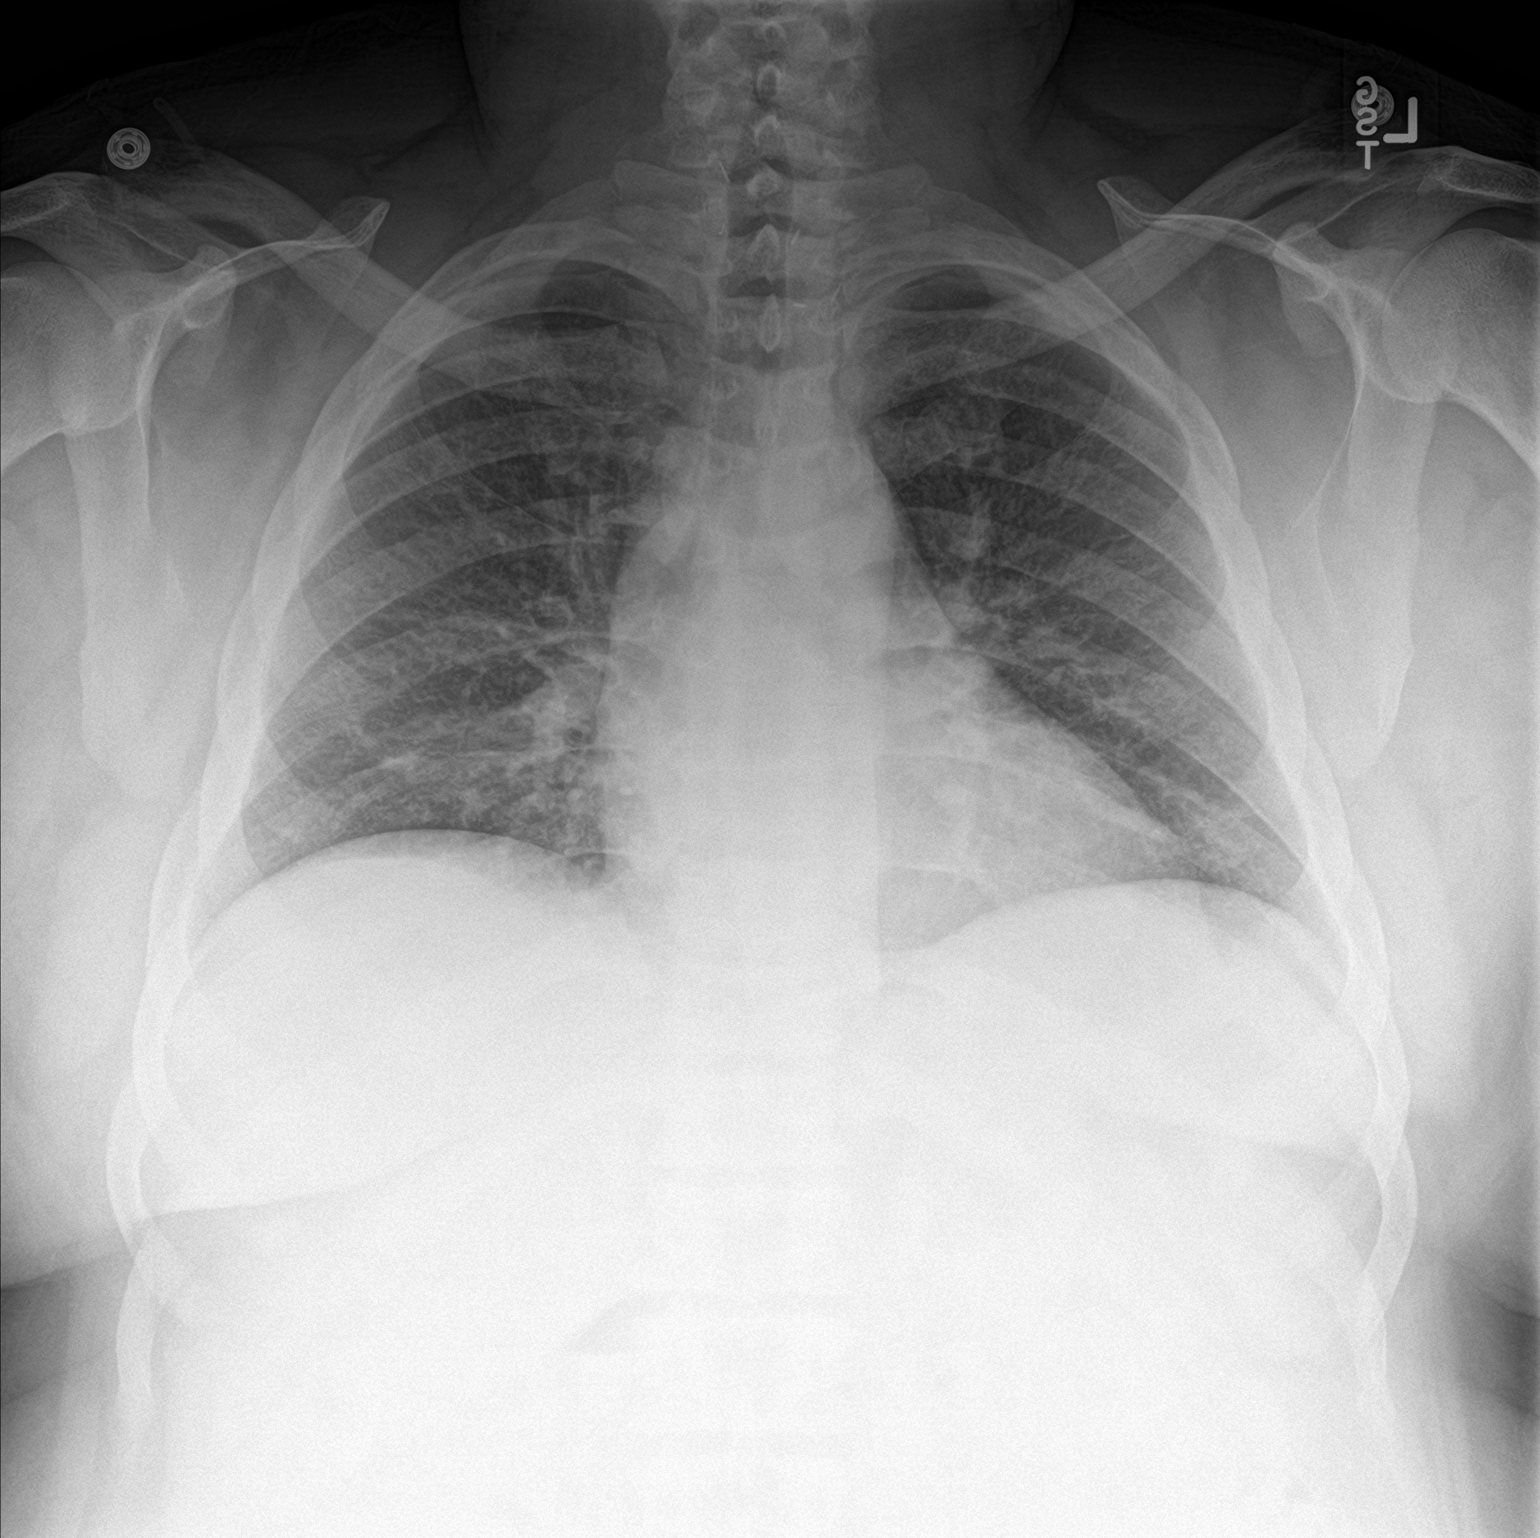

[chest lat]
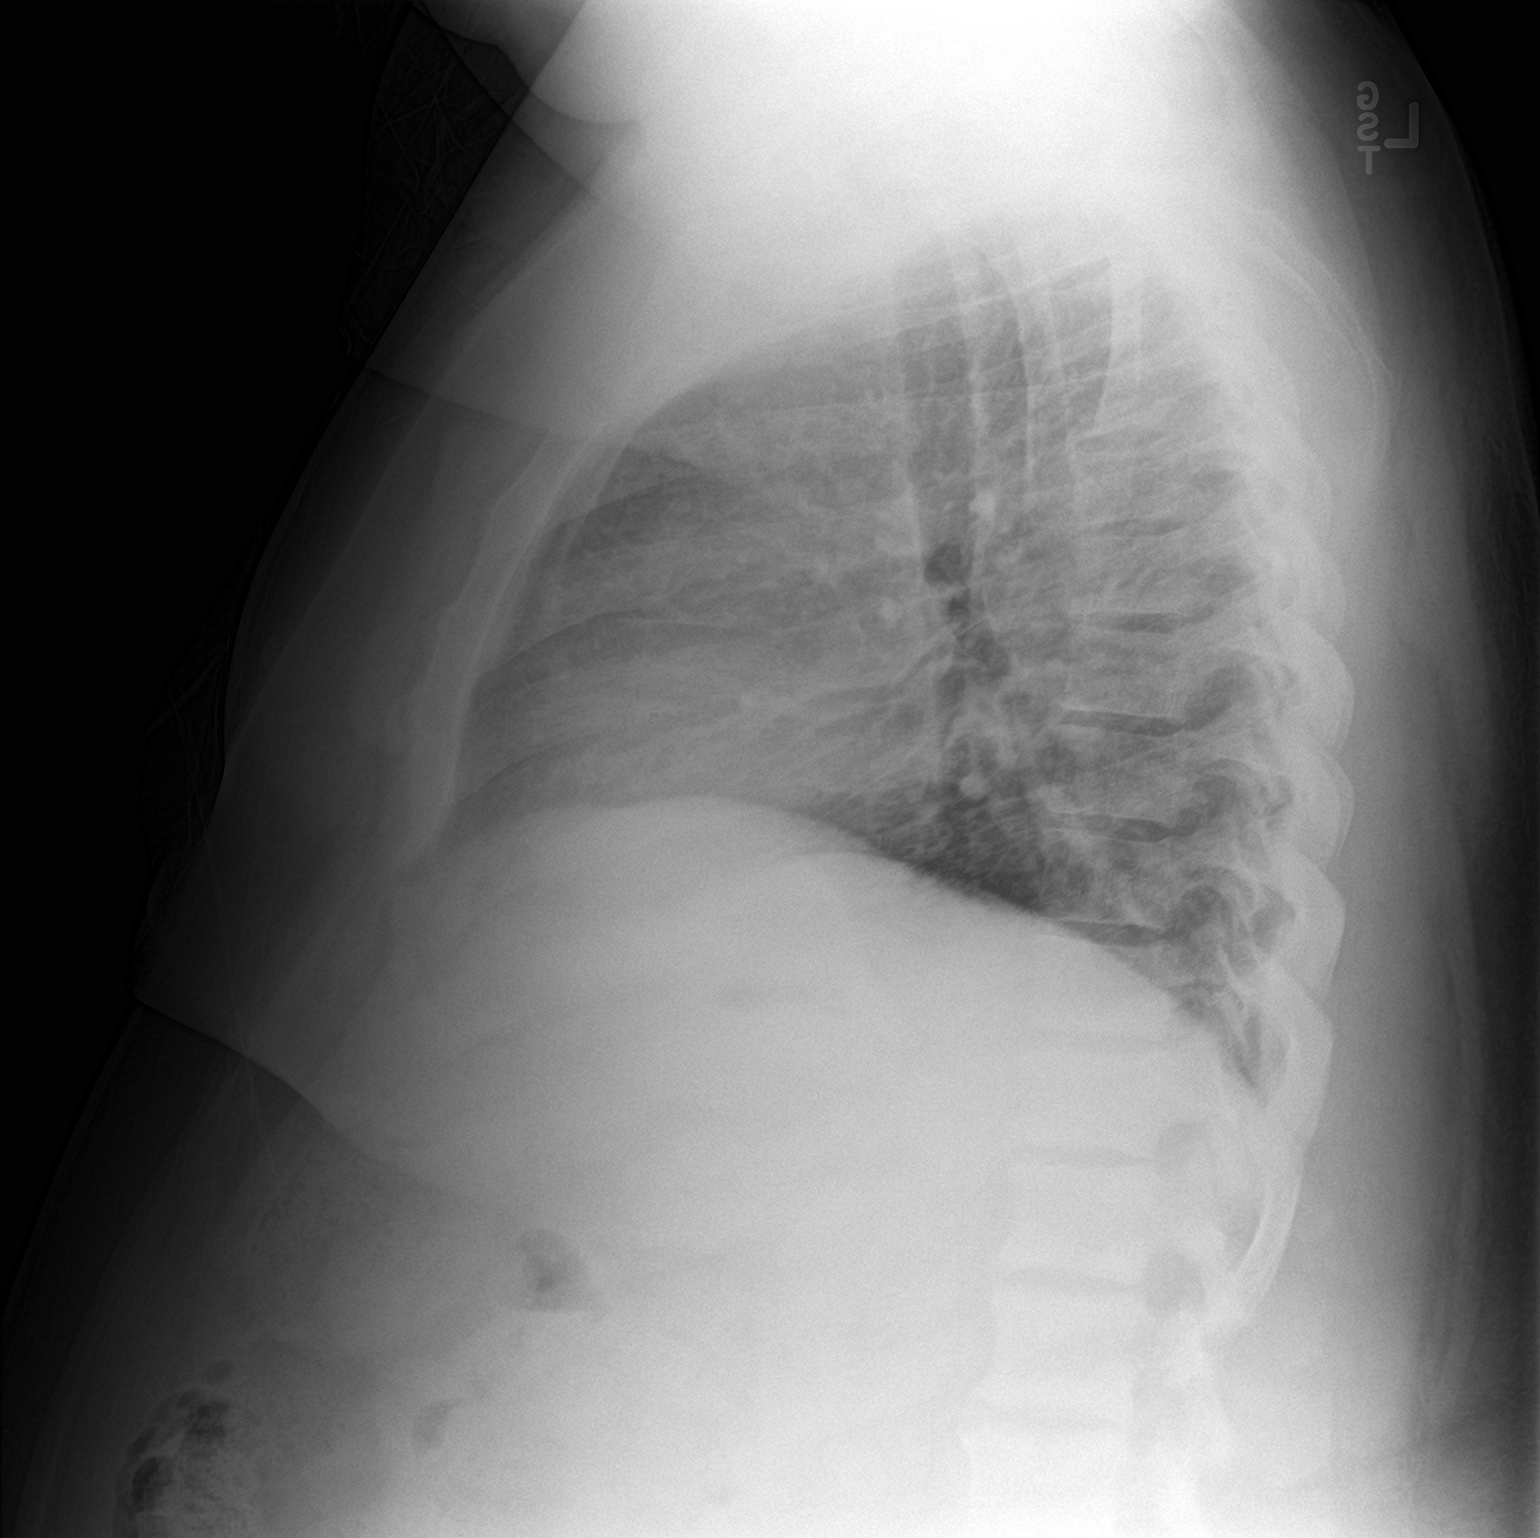

[2 of 2 positions shown; findings below may reference images not displayed]

FINDINGS: Right jugular dual-lumen central line is no longer present. No
evidence of retained fragment.

The lungs are clear. Hilar, mediastinal and cardiac contours are
unremarkable and unchanged. There is no pleural effusion. There is
no pneumothorax. Pulmonary vasculature is normal.
IMPRESSION: No evidence of retained catheter fragment. No acute cardiopulmonary
findings.

## 2017-05-19 DIAGNOSIS — N186 End stage renal disease: Secondary | ICD-10-CM | POA: Diagnosis not present

## 2017-05-19 DIAGNOSIS — E8770 Fluid overload, unspecified: Secondary | ICD-10-CM | POA: Diagnosis not present

## 2017-05-19 DIAGNOSIS — N2581 Secondary hyperparathyroidism of renal origin: Secondary | ICD-10-CM | POA: Diagnosis not present

## 2017-05-22 DIAGNOSIS — E8770 Fluid overload, unspecified: Secondary | ICD-10-CM | POA: Diagnosis not present

## 2017-05-22 DIAGNOSIS — N186 End stage renal disease: Secondary | ICD-10-CM | POA: Diagnosis not present

## 2017-05-22 DIAGNOSIS — N2581 Secondary hyperparathyroidism of renal origin: Secondary | ICD-10-CM | POA: Diagnosis not present

## 2017-05-23 DIAGNOSIS — I120 Hypertensive chronic kidney disease with stage 5 chronic kidney disease or end stage renal disease: Secondary | ICD-10-CM | POA: Diagnosis not present

## 2017-05-23 DIAGNOSIS — Z992 Dependence on renal dialysis: Secondary | ICD-10-CM | POA: Diagnosis not present

## 2017-05-23 DIAGNOSIS — N186 End stage renal disease: Secondary | ICD-10-CM | POA: Diagnosis not present

## 2017-05-24 DIAGNOSIS — N2581 Secondary hyperparathyroidism of renal origin: Secondary | ICD-10-CM | POA: Diagnosis not present

## 2017-05-24 DIAGNOSIS — N186 End stage renal disease: Secondary | ICD-10-CM | POA: Diagnosis not present

## 2017-05-26 DIAGNOSIS — N2581 Secondary hyperparathyroidism of renal origin: Secondary | ICD-10-CM | POA: Diagnosis not present

## 2017-05-26 DIAGNOSIS — N186 End stage renal disease: Secondary | ICD-10-CM | POA: Diagnosis not present

## 2017-05-29 DIAGNOSIS — N2581 Secondary hyperparathyroidism of renal origin: Secondary | ICD-10-CM | POA: Diagnosis not present

## 2017-05-29 DIAGNOSIS — N186 End stage renal disease: Secondary | ICD-10-CM | POA: Diagnosis not present

## 2017-05-31 DIAGNOSIS — N186 End stage renal disease: Secondary | ICD-10-CM | POA: Diagnosis not present

## 2017-05-31 DIAGNOSIS — N2581 Secondary hyperparathyroidism of renal origin: Secondary | ICD-10-CM | POA: Diagnosis not present

## 2017-06-02 DIAGNOSIS — N186 End stage renal disease: Secondary | ICD-10-CM | POA: Diagnosis not present

## 2017-06-02 DIAGNOSIS — N2581 Secondary hyperparathyroidism of renal origin: Secondary | ICD-10-CM | POA: Diagnosis not present

## 2017-06-05 DIAGNOSIS — N2581 Secondary hyperparathyroidism of renal origin: Secondary | ICD-10-CM | POA: Diagnosis not present

## 2017-06-05 DIAGNOSIS — N186 End stage renal disease: Secondary | ICD-10-CM | POA: Diagnosis not present

## 2017-06-07 DIAGNOSIS — N186 End stage renal disease: Secondary | ICD-10-CM | POA: Diagnosis not present

## 2017-06-07 DIAGNOSIS — N2581 Secondary hyperparathyroidism of renal origin: Secondary | ICD-10-CM | POA: Diagnosis not present

## 2017-06-09 DIAGNOSIS — N186 End stage renal disease: Secondary | ICD-10-CM | POA: Diagnosis not present

## 2017-06-09 DIAGNOSIS — N2581 Secondary hyperparathyroidism of renal origin: Secondary | ICD-10-CM | POA: Diagnosis not present

## 2017-06-12 DIAGNOSIS — N2581 Secondary hyperparathyroidism of renal origin: Secondary | ICD-10-CM | POA: Diagnosis not present

## 2017-06-12 DIAGNOSIS — N186 End stage renal disease: Secondary | ICD-10-CM | POA: Diagnosis not present

## 2017-06-14 DIAGNOSIS — N2581 Secondary hyperparathyroidism of renal origin: Secondary | ICD-10-CM | POA: Diagnosis not present

## 2017-06-14 DIAGNOSIS — N186 End stage renal disease: Secondary | ICD-10-CM | POA: Diagnosis not present

## 2017-06-16 DIAGNOSIS — N2581 Secondary hyperparathyroidism of renal origin: Secondary | ICD-10-CM | POA: Diagnosis not present

## 2017-06-16 DIAGNOSIS — N186 End stage renal disease: Secondary | ICD-10-CM | POA: Diagnosis not present

## 2017-06-19 DIAGNOSIS — N186 End stage renal disease: Secondary | ICD-10-CM | POA: Diagnosis not present

## 2017-06-19 DIAGNOSIS — N2581 Secondary hyperparathyroidism of renal origin: Secondary | ICD-10-CM | POA: Diagnosis not present

## 2017-06-21 DIAGNOSIS — N186 End stage renal disease: Secondary | ICD-10-CM | POA: Diagnosis not present

## 2017-06-21 DIAGNOSIS — N2581 Secondary hyperparathyroidism of renal origin: Secondary | ICD-10-CM | POA: Diagnosis not present

## 2017-06-23 DIAGNOSIS — I120 Hypertensive chronic kidney disease with stage 5 chronic kidney disease or end stage renal disease: Secondary | ICD-10-CM | POA: Diagnosis not present

## 2017-06-23 DIAGNOSIS — N186 End stage renal disease: Secondary | ICD-10-CM | POA: Diagnosis not present

## 2017-06-23 DIAGNOSIS — N2581 Secondary hyperparathyroidism of renal origin: Secondary | ICD-10-CM | POA: Diagnosis not present

## 2017-06-23 DIAGNOSIS — Z992 Dependence on renal dialysis: Secondary | ICD-10-CM | POA: Diagnosis not present

## 2017-06-26 DIAGNOSIS — N2581 Secondary hyperparathyroidism of renal origin: Secondary | ICD-10-CM | POA: Diagnosis not present

## 2017-06-26 DIAGNOSIS — N186 End stage renal disease: Secondary | ICD-10-CM | POA: Diagnosis not present

## 2017-06-26 DIAGNOSIS — E8770 Fluid overload, unspecified: Secondary | ICD-10-CM | POA: Diagnosis not present

## 2017-06-28 DIAGNOSIS — N186 End stage renal disease: Secondary | ICD-10-CM | POA: Diagnosis not present

## 2017-06-28 DIAGNOSIS — N2581 Secondary hyperparathyroidism of renal origin: Secondary | ICD-10-CM | POA: Diagnosis not present

## 2017-06-28 DIAGNOSIS — E8770 Fluid overload, unspecified: Secondary | ICD-10-CM | POA: Diagnosis not present

## 2017-06-30 DIAGNOSIS — N2581 Secondary hyperparathyroidism of renal origin: Secondary | ICD-10-CM | POA: Diagnosis not present

## 2017-06-30 DIAGNOSIS — N186 End stage renal disease: Secondary | ICD-10-CM | POA: Diagnosis not present

## 2017-06-30 DIAGNOSIS — E8770 Fluid overload, unspecified: Secondary | ICD-10-CM | POA: Diagnosis not present

## 2017-07-03 DIAGNOSIS — N186 End stage renal disease: Secondary | ICD-10-CM | POA: Diagnosis not present

## 2017-07-03 DIAGNOSIS — N2581 Secondary hyperparathyroidism of renal origin: Secondary | ICD-10-CM | POA: Diagnosis not present

## 2017-07-03 DIAGNOSIS — E8770 Fluid overload, unspecified: Secondary | ICD-10-CM | POA: Diagnosis not present

## 2017-07-05 DIAGNOSIS — N186 End stage renal disease: Secondary | ICD-10-CM | POA: Diagnosis not present

## 2017-07-05 DIAGNOSIS — N2581 Secondary hyperparathyroidism of renal origin: Secondary | ICD-10-CM | POA: Diagnosis not present

## 2017-07-05 DIAGNOSIS — E8770 Fluid overload, unspecified: Secondary | ICD-10-CM | POA: Diagnosis not present

## 2017-07-07 DIAGNOSIS — N186 End stage renal disease: Secondary | ICD-10-CM | POA: Diagnosis not present

## 2017-07-07 DIAGNOSIS — E8770 Fluid overload, unspecified: Secondary | ICD-10-CM | POA: Diagnosis not present

## 2017-07-07 DIAGNOSIS — N2581 Secondary hyperparathyroidism of renal origin: Secondary | ICD-10-CM | POA: Diagnosis not present

## 2017-07-10 DIAGNOSIS — N2581 Secondary hyperparathyroidism of renal origin: Secondary | ICD-10-CM | POA: Diagnosis not present

## 2017-07-10 DIAGNOSIS — E8770 Fluid overload, unspecified: Secondary | ICD-10-CM | POA: Diagnosis not present

## 2017-07-10 DIAGNOSIS — N186 End stage renal disease: Secondary | ICD-10-CM | POA: Diagnosis not present

## 2017-07-12 DIAGNOSIS — N186 End stage renal disease: Secondary | ICD-10-CM | POA: Diagnosis not present

## 2017-07-12 DIAGNOSIS — N2581 Secondary hyperparathyroidism of renal origin: Secondary | ICD-10-CM | POA: Diagnosis not present

## 2017-07-12 DIAGNOSIS — E8770 Fluid overload, unspecified: Secondary | ICD-10-CM | POA: Diagnosis not present

## 2017-07-13 DIAGNOSIS — N2581 Secondary hyperparathyroidism of renal origin: Secondary | ICD-10-CM | POA: Diagnosis not present

## 2017-07-13 DIAGNOSIS — N186 End stage renal disease: Secondary | ICD-10-CM | POA: Diagnosis not present

## 2017-07-13 DIAGNOSIS — E8770 Fluid overload, unspecified: Secondary | ICD-10-CM | POA: Diagnosis not present

## 2017-07-14 DIAGNOSIS — N186 End stage renal disease: Secondary | ICD-10-CM | POA: Diagnosis not present

## 2017-07-14 DIAGNOSIS — N2581 Secondary hyperparathyroidism of renal origin: Secondary | ICD-10-CM | POA: Diagnosis not present

## 2017-07-14 DIAGNOSIS — E8770 Fluid overload, unspecified: Secondary | ICD-10-CM | POA: Diagnosis not present

## 2017-07-17 DIAGNOSIS — N2581 Secondary hyperparathyroidism of renal origin: Secondary | ICD-10-CM | POA: Diagnosis not present

## 2017-07-17 DIAGNOSIS — E8770 Fluid overload, unspecified: Secondary | ICD-10-CM | POA: Diagnosis not present

## 2017-07-17 DIAGNOSIS — N186 End stage renal disease: Secondary | ICD-10-CM | POA: Diagnosis not present

## 2017-07-19 DIAGNOSIS — N2581 Secondary hyperparathyroidism of renal origin: Secondary | ICD-10-CM | POA: Diagnosis not present

## 2017-07-19 DIAGNOSIS — N186 End stage renal disease: Secondary | ICD-10-CM | POA: Diagnosis not present

## 2017-07-19 DIAGNOSIS — E8770 Fluid overload, unspecified: Secondary | ICD-10-CM | POA: Diagnosis not present

## 2017-07-21 DIAGNOSIS — E8770 Fluid overload, unspecified: Secondary | ICD-10-CM | POA: Diagnosis not present

## 2017-07-21 DIAGNOSIS — N186 End stage renal disease: Secondary | ICD-10-CM | POA: Diagnosis not present

## 2017-07-21 DIAGNOSIS — N2581 Secondary hyperparathyroidism of renal origin: Secondary | ICD-10-CM | POA: Diagnosis not present

## 2017-07-23 DIAGNOSIS — Z992 Dependence on renal dialysis: Secondary | ICD-10-CM | POA: Diagnosis not present

## 2017-07-23 DIAGNOSIS — N186 End stage renal disease: Secondary | ICD-10-CM | POA: Diagnosis not present

## 2017-07-23 DIAGNOSIS — I120 Hypertensive chronic kidney disease with stage 5 chronic kidney disease or end stage renal disease: Secondary | ICD-10-CM | POA: Diagnosis not present

## 2017-07-24 DIAGNOSIS — Z23 Encounter for immunization: Secondary | ICD-10-CM | POA: Diagnosis not present

## 2017-07-24 DIAGNOSIS — N2581 Secondary hyperparathyroidism of renal origin: Secondary | ICD-10-CM | POA: Diagnosis not present

## 2017-07-24 DIAGNOSIS — N186 End stage renal disease: Secondary | ICD-10-CM | POA: Diagnosis not present

## 2017-07-26 DIAGNOSIS — N186 End stage renal disease: Secondary | ICD-10-CM | POA: Diagnosis not present

## 2017-07-26 DIAGNOSIS — N2581 Secondary hyperparathyroidism of renal origin: Secondary | ICD-10-CM | POA: Diagnosis not present

## 2017-07-26 DIAGNOSIS — Z23 Encounter for immunization: Secondary | ICD-10-CM | POA: Diagnosis not present

## 2017-07-28 DIAGNOSIS — N2581 Secondary hyperparathyroidism of renal origin: Secondary | ICD-10-CM | POA: Diagnosis not present

## 2017-07-28 DIAGNOSIS — Z23 Encounter for immunization: Secondary | ICD-10-CM | POA: Diagnosis not present

## 2017-07-28 DIAGNOSIS — N186 End stage renal disease: Secondary | ICD-10-CM | POA: Diagnosis not present

## 2017-07-31 DIAGNOSIS — Z23 Encounter for immunization: Secondary | ICD-10-CM | POA: Diagnosis not present

## 2017-07-31 DIAGNOSIS — N186 End stage renal disease: Secondary | ICD-10-CM | POA: Diagnosis not present

## 2017-07-31 DIAGNOSIS — N2581 Secondary hyperparathyroidism of renal origin: Secondary | ICD-10-CM | POA: Diagnosis not present

## 2017-08-02 DIAGNOSIS — N2581 Secondary hyperparathyroidism of renal origin: Secondary | ICD-10-CM | POA: Diagnosis not present

## 2017-08-02 DIAGNOSIS — Z23 Encounter for immunization: Secondary | ICD-10-CM | POA: Diagnosis not present

## 2017-08-02 DIAGNOSIS — N186 End stage renal disease: Secondary | ICD-10-CM | POA: Diagnosis not present

## 2017-08-04 DIAGNOSIS — N186 End stage renal disease: Secondary | ICD-10-CM | POA: Diagnosis not present

## 2017-08-04 DIAGNOSIS — N2581 Secondary hyperparathyroidism of renal origin: Secondary | ICD-10-CM | POA: Diagnosis not present

## 2017-08-04 DIAGNOSIS — Z23 Encounter for immunization: Secondary | ICD-10-CM | POA: Diagnosis not present

## 2017-08-07 DIAGNOSIS — N2581 Secondary hyperparathyroidism of renal origin: Secondary | ICD-10-CM | POA: Diagnosis not present

## 2017-08-07 DIAGNOSIS — Z23 Encounter for immunization: Secondary | ICD-10-CM | POA: Diagnosis not present

## 2017-08-07 DIAGNOSIS — N186 End stage renal disease: Secondary | ICD-10-CM | POA: Diagnosis not present

## 2017-08-09 DIAGNOSIS — N2581 Secondary hyperparathyroidism of renal origin: Secondary | ICD-10-CM | POA: Diagnosis not present

## 2017-08-09 DIAGNOSIS — N186 End stage renal disease: Secondary | ICD-10-CM | POA: Diagnosis not present

## 2017-08-09 DIAGNOSIS — Z23 Encounter for immunization: Secondary | ICD-10-CM | POA: Diagnosis not present

## 2017-08-11 DIAGNOSIS — Z23 Encounter for immunization: Secondary | ICD-10-CM | POA: Diagnosis not present

## 2017-08-11 DIAGNOSIS — N2581 Secondary hyperparathyroidism of renal origin: Secondary | ICD-10-CM | POA: Diagnosis not present

## 2017-08-11 DIAGNOSIS — N186 End stage renal disease: Secondary | ICD-10-CM | POA: Diagnosis not present

## 2017-08-14 DIAGNOSIS — Z23 Encounter for immunization: Secondary | ICD-10-CM | POA: Diagnosis not present

## 2017-08-14 DIAGNOSIS — N186 End stage renal disease: Secondary | ICD-10-CM | POA: Diagnosis not present

## 2017-08-14 DIAGNOSIS — N2581 Secondary hyperparathyroidism of renal origin: Secondary | ICD-10-CM | POA: Diagnosis not present

## 2017-08-15 DIAGNOSIS — Z992 Dependence on renal dialysis: Secondary | ICD-10-CM | POA: Diagnosis not present

## 2017-08-15 DIAGNOSIS — I871 Compression of vein: Secondary | ICD-10-CM | POA: Diagnosis not present

## 2017-08-15 DIAGNOSIS — T82858A Stenosis of vascular prosthetic devices, implants and grafts, initial encounter: Secondary | ICD-10-CM | POA: Diagnosis not present

## 2017-08-15 DIAGNOSIS — N186 End stage renal disease: Secondary | ICD-10-CM | POA: Diagnosis not present

## 2017-08-16 DIAGNOSIS — N2581 Secondary hyperparathyroidism of renal origin: Secondary | ICD-10-CM | POA: Diagnosis not present

## 2017-08-16 DIAGNOSIS — Z23 Encounter for immunization: Secondary | ICD-10-CM | POA: Diagnosis not present

## 2017-08-16 DIAGNOSIS — N186 End stage renal disease: Secondary | ICD-10-CM | POA: Diagnosis not present

## 2017-08-18 DIAGNOSIS — N2581 Secondary hyperparathyroidism of renal origin: Secondary | ICD-10-CM | POA: Diagnosis not present

## 2017-08-18 DIAGNOSIS — N186 End stage renal disease: Secondary | ICD-10-CM | POA: Diagnosis not present

## 2017-08-18 DIAGNOSIS — Z23 Encounter for immunization: Secondary | ICD-10-CM | POA: Diagnosis not present

## 2017-08-21 DIAGNOSIS — Z23 Encounter for immunization: Secondary | ICD-10-CM | POA: Diagnosis not present

## 2017-08-21 DIAGNOSIS — N2581 Secondary hyperparathyroidism of renal origin: Secondary | ICD-10-CM | POA: Diagnosis not present

## 2017-08-21 DIAGNOSIS — N186 End stage renal disease: Secondary | ICD-10-CM | POA: Diagnosis not present

## 2017-08-23 DIAGNOSIS — N2581 Secondary hyperparathyroidism of renal origin: Secondary | ICD-10-CM | POA: Diagnosis not present

## 2017-08-23 DIAGNOSIS — Z23 Encounter for immunization: Secondary | ICD-10-CM | POA: Diagnosis not present

## 2017-08-23 DIAGNOSIS — N186 End stage renal disease: Secondary | ICD-10-CM | POA: Diagnosis not present

## 2017-08-23 DIAGNOSIS — I120 Hypertensive chronic kidney disease with stage 5 chronic kidney disease or end stage renal disease: Secondary | ICD-10-CM | POA: Diagnosis not present

## 2017-08-23 DIAGNOSIS — Z992 Dependence on renal dialysis: Secondary | ICD-10-CM | POA: Diagnosis not present

## 2017-08-25 DIAGNOSIS — N186 End stage renal disease: Secondary | ICD-10-CM | POA: Diagnosis not present

## 2017-08-25 DIAGNOSIS — N2581 Secondary hyperparathyroidism of renal origin: Secondary | ICD-10-CM | POA: Diagnosis not present

## 2017-08-28 DIAGNOSIS — N2581 Secondary hyperparathyroidism of renal origin: Secondary | ICD-10-CM | POA: Diagnosis not present

## 2017-08-28 DIAGNOSIS — N186 End stage renal disease: Secondary | ICD-10-CM | POA: Diagnosis not present

## 2017-08-30 DIAGNOSIS — N2581 Secondary hyperparathyroidism of renal origin: Secondary | ICD-10-CM | POA: Diagnosis not present

## 2017-08-30 DIAGNOSIS — N186 End stage renal disease: Secondary | ICD-10-CM | POA: Diagnosis not present

## 2017-09-01 DIAGNOSIS — N2581 Secondary hyperparathyroidism of renal origin: Secondary | ICD-10-CM | POA: Diagnosis not present

## 2017-09-01 DIAGNOSIS — N186 End stage renal disease: Secondary | ICD-10-CM | POA: Diagnosis not present

## 2017-09-04 DIAGNOSIS — N2581 Secondary hyperparathyroidism of renal origin: Secondary | ICD-10-CM | POA: Diagnosis not present

## 2017-09-04 DIAGNOSIS — N186 End stage renal disease: Secondary | ICD-10-CM | POA: Diagnosis not present

## 2017-09-06 DIAGNOSIS — N2581 Secondary hyperparathyroidism of renal origin: Secondary | ICD-10-CM | POA: Diagnosis not present

## 2017-09-06 DIAGNOSIS — N186 End stage renal disease: Secondary | ICD-10-CM | POA: Diagnosis not present

## 2017-09-08 DIAGNOSIS — N2581 Secondary hyperparathyroidism of renal origin: Secondary | ICD-10-CM | POA: Diagnosis not present

## 2017-09-08 DIAGNOSIS — N186 End stage renal disease: Secondary | ICD-10-CM | POA: Diagnosis not present

## 2017-09-11 DIAGNOSIS — N2581 Secondary hyperparathyroidism of renal origin: Secondary | ICD-10-CM | POA: Diagnosis not present

## 2017-09-11 DIAGNOSIS — N186 End stage renal disease: Secondary | ICD-10-CM | POA: Diagnosis not present

## 2017-09-13 DIAGNOSIS — N2581 Secondary hyperparathyroidism of renal origin: Secondary | ICD-10-CM | POA: Diagnosis not present

## 2017-09-13 DIAGNOSIS — N186 End stage renal disease: Secondary | ICD-10-CM | POA: Diagnosis not present

## 2017-09-15 DIAGNOSIS — N186 End stage renal disease: Secondary | ICD-10-CM | POA: Diagnosis not present

## 2017-09-15 DIAGNOSIS — N2581 Secondary hyperparathyroidism of renal origin: Secondary | ICD-10-CM | POA: Diagnosis not present

## 2017-09-18 DIAGNOSIS — N186 End stage renal disease: Secondary | ICD-10-CM | POA: Diagnosis not present

## 2017-09-18 DIAGNOSIS — N2581 Secondary hyperparathyroidism of renal origin: Secondary | ICD-10-CM | POA: Diagnosis not present

## 2017-09-20 DIAGNOSIS — N2581 Secondary hyperparathyroidism of renal origin: Secondary | ICD-10-CM | POA: Diagnosis not present

## 2017-09-20 DIAGNOSIS — N186 End stage renal disease: Secondary | ICD-10-CM | POA: Diagnosis not present

## 2017-09-22 DIAGNOSIS — N2581 Secondary hyperparathyroidism of renal origin: Secondary | ICD-10-CM | POA: Diagnosis not present

## 2017-09-22 DIAGNOSIS — N186 End stage renal disease: Secondary | ICD-10-CM | POA: Diagnosis not present

## 2017-09-22 DIAGNOSIS — Z992 Dependence on renal dialysis: Secondary | ICD-10-CM | POA: Diagnosis not present

## 2017-09-22 DIAGNOSIS — I120 Hypertensive chronic kidney disease with stage 5 chronic kidney disease or end stage renal disease: Secondary | ICD-10-CM | POA: Diagnosis not present

## 2017-09-25 DIAGNOSIS — N2581 Secondary hyperparathyroidism of renal origin: Secondary | ICD-10-CM | POA: Diagnosis not present

## 2017-09-25 DIAGNOSIS — N186 End stage renal disease: Secondary | ICD-10-CM | POA: Diagnosis not present

## 2017-09-27 DIAGNOSIS — N2581 Secondary hyperparathyroidism of renal origin: Secondary | ICD-10-CM | POA: Diagnosis not present

## 2017-09-27 DIAGNOSIS — N186 End stage renal disease: Secondary | ICD-10-CM | POA: Diagnosis not present

## 2017-09-29 DIAGNOSIS — N2581 Secondary hyperparathyroidism of renal origin: Secondary | ICD-10-CM | POA: Diagnosis not present

## 2017-09-29 DIAGNOSIS — N186 End stage renal disease: Secondary | ICD-10-CM | POA: Diagnosis not present

## 2017-10-02 DIAGNOSIS — N2581 Secondary hyperparathyroidism of renal origin: Secondary | ICD-10-CM | POA: Diagnosis not present

## 2017-10-02 DIAGNOSIS — N186 End stage renal disease: Secondary | ICD-10-CM | POA: Diagnosis not present

## 2017-10-04 DIAGNOSIS — N186 End stage renal disease: Secondary | ICD-10-CM | POA: Diagnosis not present

## 2017-10-04 DIAGNOSIS — N2581 Secondary hyperparathyroidism of renal origin: Secondary | ICD-10-CM | POA: Diagnosis not present

## 2017-10-06 DIAGNOSIS — N2581 Secondary hyperparathyroidism of renal origin: Secondary | ICD-10-CM | POA: Diagnosis not present

## 2017-10-06 DIAGNOSIS — N186 End stage renal disease: Secondary | ICD-10-CM | POA: Diagnosis not present

## 2017-10-09 DIAGNOSIS — N186 End stage renal disease: Secondary | ICD-10-CM | POA: Diagnosis not present

## 2017-10-09 DIAGNOSIS — N2581 Secondary hyperparathyroidism of renal origin: Secondary | ICD-10-CM | POA: Diagnosis not present

## 2017-10-11 DIAGNOSIS — N186 End stage renal disease: Secondary | ICD-10-CM | POA: Diagnosis not present

## 2017-10-11 DIAGNOSIS — N2581 Secondary hyperparathyroidism of renal origin: Secondary | ICD-10-CM | POA: Diagnosis not present

## 2017-10-13 DIAGNOSIS — N2581 Secondary hyperparathyroidism of renal origin: Secondary | ICD-10-CM | POA: Diagnosis not present

## 2017-10-13 DIAGNOSIS — N186 End stage renal disease: Secondary | ICD-10-CM | POA: Diagnosis not present

## 2017-10-15 DIAGNOSIS — N186 End stage renal disease: Secondary | ICD-10-CM | POA: Diagnosis not present

## 2017-10-15 DIAGNOSIS — N2581 Secondary hyperparathyroidism of renal origin: Secondary | ICD-10-CM | POA: Diagnosis not present

## 2017-10-18 DIAGNOSIS — N186 End stage renal disease: Secondary | ICD-10-CM | POA: Diagnosis not present

## 2017-10-18 DIAGNOSIS — N2581 Secondary hyperparathyroidism of renal origin: Secondary | ICD-10-CM | POA: Diagnosis not present

## 2017-10-19 DIAGNOSIS — I871 Compression of vein: Secondary | ICD-10-CM | POA: Diagnosis not present

## 2017-10-19 DIAGNOSIS — N186 End stage renal disease: Secondary | ICD-10-CM | POA: Diagnosis not present

## 2017-10-19 DIAGNOSIS — Z992 Dependence on renal dialysis: Secondary | ICD-10-CM | POA: Diagnosis not present

## 2017-10-20 DIAGNOSIS — N2581 Secondary hyperparathyroidism of renal origin: Secondary | ICD-10-CM | POA: Diagnosis not present

## 2017-10-20 DIAGNOSIS — N186 End stage renal disease: Secondary | ICD-10-CM | POA: Diagnosis not present

## 2017-10-23 DIAGNOSIS — Z992 Dependence on renal dialysis: Secondary | ICD-10-CM | POA: Diagnosis not present

## 2017-10-23 DIAGNOSIS — N2581 Secondary hyperparathyroidism of renal origin: Secondary | ICD-10-CM | POA: Diagnosis not present

## 2017-10-23 DIAGNOSIS — I120 Hypertensive chronic kidney disease with stage 5 chronic kidney disease or end stage renal disease: Secondary | ICD-10-CM | POA: Diagnosis not present

## 2017-10-23 DIAGNOSIS — N186 End stage renal disease: Secondary | ICD-10-CM | POA: Diagnosis not present

## 2017-10-25 DIAGNOSIS — N2581 Secondary hyperparathyroidism of renal origin: Secondary | ICD-10-CM | POA: Diagnosis not present

## 2017-10-25 DIAGNOSIS — N186 End stage renal disease: Secondary | ICD-10-CM | POA: Diagnosis not present

## 2017-10-27 DIAGNOSIS — N186 End stage renal disease: Secondary | ICD-10-CM | POA: Diagnosis not present

## 2017-10-27 DIAGNOSIS — N2581 Secondary hyperparathyroidism of renal origin: Secondary | ICD-10-CM | POA: Diagnosis not present

## 2017-10-30 DIAGNOSIS — N186 End stage renal disease: Secondary | ICD-10-CM | POA: Diagnosis not present

## 2017-10-30 DIAGNOSIS — N2581 Secondary hyperparathyroidism of renal origin: Secondary | ICD-10-CM | POA: Diagnosis not present

## 2017-11-01 DIAGNOSIS — N2581 Secondary hyperparathyroidism of renal origin: Secondary | ICD-10-CM | POA: Diagnosis not present

## 2017-11-01 DIAGNOSIS — N186 End stage renal disease: Secondary | ICD-10-CM | POA: Diagnosis not present

## 2017-11-03 DIAGNOSIS — N2581 Secondary hyperparathyroidism of renal origin: Secondary | ICD-10-CM | POA: Diagnosis not present

## 2017-11-03 DIAGNOSIS — N186 End stage renal disease: Secondary | ICD-10-CM | POA: Diagnosis not present

## 2017-11-06 DIAGNOSIS — N186 End stage renal disease: Secondary | ICD-10-CM | POA: Diagnosis not present

## 2017-11-06 DIAGNOSIS — N2581 Secondary hyperparathyroidism of renal origin: Secondary | ICD-10-CM | POA: Diagnosis not present

## 2017-11-08 DIAGNOSIS — N186 End stage renal disease: Secondary | ICD-10-CM | POA: Diagnosis not present

## 2017-11-08 DIAGNOSIS — N2581 Secondary hyperparathyroidism of renal origin: Secondary | ICD-10-CM | POA: Diagnosis not present

## 2017-11-10 DIAGNOSIS — N2581 Secondary hyperparathyroidism of renal origin: Secondary | ICD-10-CM | POA: Diagnosis not present

## 2017-11-10 DIAGNOSIS — N186 End stage renal disease: Secondary | ICD-10-CM | POA: Diagnosis not present

## 2017-11-13 DIAGNOSIS — N186 End stage renal disease: Secondary | ICD-10-CM | POA: Diagnosis not present

## 2017-11-13 DIAGNOSIS — N2581 Secondary hyperparathyroidism of renal origin: Secondary | ICD-10-CM | POA: Diagnosis not present

## 2017-11-15 DIAGNOSIS — N186 End stage renal disease: Secondary | ICD-10-CM | POA: Diagnosis not present

## 2017-11-15 DIAGNOSIS — N2581 Secondary hyperparathyroidism of renal origin: Secondary | ICD-10-CM | POA: Diagnosis not present

## 2017-11-17 DIAGNOSIS — N2581 Secondary hyperparathyroidism of renal origin: Secondary | ICD-10-CM | POA: Diagnosis not present

## 2017-11-17 DIAGNOSIS — N186 End stage renal disease: Secondary | ICD-10-CM | POA: Diagnosis not present

## 2017-11-20 DIAGNOSIS — N186 End stage renal disease: Secondary | ICD-10-CM | POA: Diagnosis not present

## 2017-11-20 DIAGNOSIS — N2581 Secondary hyperparathyroidism of renal origin: Secondary | ICD-10-CM | POA: Diagnosis not present

## 2017-11-22 DIAGNOSIS — N186 End stage renal disease: Secondary | ICD-10-CM | POA: Diagnosis not present

## 2017-11-22 DIAGNOSIS — N2581 Secondary hyperparathyroidism of renal origin: Secondary | ICD-10-CM | POA: Diagnosis not present

## 2017-11-23 DIAGNOSIS — N186 End stage renal disease: Secondary | ICD-10-CM | POA: Diagnosis not present

## 2017-11-23 DIAGNOSIS — I120 Hypertensive chronic kidney disease with stage 5 chronic kidney disease or end stage renal disease: Secondary | ICD-10-CM | POA: Diagnosis not present

## 2017-11-23 DIAGNOSIS — Z992 Dependence on renal dialysis: Secondary | ICD-10-CM | POA: Diagnosis not present

## 2017-11-24 DIAGNOSIS — N186 End stage renal disease: Secondary | ICD-10-CM | POA: Diagnosis not present

## 2017-11-24 DIAGNOSIS — N2581 Secondary hyperparathyroidism of renal origin: Secondary | ICD-10-CM | POA: Diagnosis not present

## 2017-11-24 DIAGNOSIS — I120 Hypertensive chronic kidney disease with stage 5 chronic kidney disease or end stage renal disease: Secondary | ICD-10-CM | POA: Diagnosis not present

## 2017-11-24 DIAGNOSIS — Z992 Dependence on renal dialysis: Secondary | ICD-10-CM | POA: Diagnosis not present

## 2017-11-27 DIAGNOSIS — N2581 Secondary hyperparathyroidism of renal origin: Secondary | ICD-10-CM | POA: Diagnosis not present

## 2017-11-27 DIAGNOSIS — N186 End stage renal disease: Secondary | ICD-10-CM | POA: Diagnosis not present

## 2017-11-29 DIAGNOSIS — N186 End stage renal disease: Secondary | ICD-10-CM | POA: Diagnosis not present

## 2017-11-29 DIAGNOSIS — N2581 Secondary hyperparathyroidism of renal origin: Secondary | ICD-10-CM | POA: Diagnosis not present

## 2017-12-01 DIAGNOSIS — N2581 Secondary hyperparathyroidism of renal origin: Secondary | ICD-10-CM | POA: Diagnosis not present

## 2017-12-01 DIAGNOSIS — N186 End stage renal disease: Secondary | ICD-10-CM | POA: Diagnosis not present

## 2017-12-04 DIAGNOSIS — N2581 Secondary hyperparathyroidism of renal origin: Secondary | ICD-10-CM | POA: Diagnosis not present

## 2017-12-04 DIAGNOSIS — N186 End stage renal disease: Secondary | ICD-10-CM | POA: Diagnosis not present

## 2017-12-05 ENCOUNTER — Encounter: Payer: Self-pay | Admitting: Vascular Surgery

## 2017-12-05 ENCOUNTER — Ambulatory Visit (INDEPENDENT_AMBULATORY_CARE_PROVIDER_SITE_OTHER): Payer: Medicare Other | Admitting: Vascular Surgery

## 2017-12-05 VITALS — BP 100/69 | HR 104 | Temp 97.6°F | Resp 16 | Ht 71.5 in | Wt 330.0 lb

## 2017-12-05 DIAGNOSIS — N186 End stage renal disease: Secondary | ICD-10-CM | POA: Diagnosis not present

## 2017-12-05 NOTE — Progress Notes (Signed)
Vascular and Vein Specialist of [facility]  Patient name: [name] MRN: [id number] DOB: Jul 18, 1971 Sex: male  REASON FOR VISIT: Follow-up left upper arm basilic vein transposition fistula  HPI: [name] is a 47 y.o. male here today for follow-up of his left arm basilic vein transposition fistula.  This was placed by myself approximately 1 year ago on 11/28/2016.  He is having some flow issues shuntogram and angioplasty of stenosis near the arteriovenous anastomosis in the mid upper arm by Dr. Posey Pronto.  He was concerned regarding some residual stent near the arterial anastomosis and he is a seen today for evaluation of this.  The patient reports that he is having good flow velocities and they are having no access accessing his fistula.  Past Medical History:  Diagnosis Date  . Anemia    Iron deficiency  . Aneurysm ([place]) 02/15/2007   Right arm large aneurysm  . Constipation   . End stage renal disease on dialysis ([place])    M- W-F  . GERD (gastroesophageal reflux disease)   . Hemodialysis finding May 2 ,2005   First HD at [facility].  Marland Kitchen History of bronchitis   . Hypertension   . Sickle cell trait (H[facility])     Family History  Problem Relation Age of Onset  . Hypertension Mother   . Heart disease Mother        before age 64  . Other Brother        DVT    SOCIAL HISTORY: Social History   Tobacco Use  . Smoking status: Light Tobacco Smoker    Types: Cigars  . Smokeless tobacco: Never Used  . Tobacco comment: pt states he smokes about 2 cigars per year  Substance Use Topics  . Alcohol use: Yes    Comment: rarely    Allergies  Allergen Reactions  . No Known Allergies   . Other Itching    Chlorahexadine- causing ? Irritation at IV site, no problem with body wipes    Current Outpatient Medications  Medication Sig Dispense Refill  . acetaminophen (TYLENOL) 500 MG tablet Take 1,000 mg by mouth every 8 (eight) hours as needed  (pain/headache).     Marland Kitchen b complex-vitamin c-folic acid (NEPHRO-VITE) 0.8 MG TABS Take 1 tablet by mouth daily.     . calcitRIOL (ROCALTROL) 0.5 MCG capsule Take 0.5 mcg by mouth 2 (two) times daily.     . calcium elemental as carbonate (BARIATRIC TUMS ULTRA) 400 MG chewable tablet Chew 1,000 mg by mouth 3 (three) times daily.     . midodrine (PROAMATINE) 10 MG tablet Take 10 mg by mouth See admin instructions. Take 1 tablet (10 mg) by mouth prior to dialysis and 1 tablet (10 mg) half way through dialysis - Monday, Wednesday, Friday    . oxyCODONE-acetaminophen (PERCOCET/ROXICET) 5-325 MG tablet Take 1 tablet by mouth every 6 (six) hours as needed. (Patient not taking: Reported on 01/10/2017) 30 tablet 0  . promethazine (PHENERGAN) 25 MG tablet Take 12.5-25 mg by mouth every 6 (six) hours as needed for nausea or vomiting.    . ranitidine (ZANTAC) 150 MG capsule Take 150 mg by mouth daily as needed for heartburn. For heartburn    . sucroferric oxyhydroxide (VELPHORO) 500 MG chewable tablet Chew 1,000 mg by mouth 3 (three) times daily with meals.     No current facility-administered medications for this visit.     REVIEW OF SYSTEMS:  [X]  denotes positive finding, [ ]   denotes negative finding Cardiac  Comments:  Chest pain or chest pressure:    Shortness of breath upon exertion:    Short of breath when lying flat:    Irregular heart rhythm:        Vascular    Pain in calf, thigh, or hip brought on by ambulation:    Pain in feet at night that wakes you up from your sleep:     Blood clot in your veins:    Leg swelling:           PHYSICAL EXAM: Vitals:   12/05/17 1135  BP: 100/69  Pulse: (!) 104  Resp: 16  Temp: 97.6 F (36.4 C)  SpO2: 96%  Weight: (!) 330 lb (149.7 kg)  Height: 5' 11.5" (1.816 m)    GENERAL: The patient is a well-nourished male, in no acute distress. The vital signs are documented above. CARDIOVASCULAR: Excellent thrill in his upper arm fistula with a very nicely  developed vein.  He is obese and the fistula does run somewhat on the aspect of his upper arm. PULMONARY: There is good air exchange  MUSCULOSKELETAL: There are no major deformities or cyanosis. NEUROLOGIC: No focal weakness or paresthesias are detected. SKIN: There are no ulcers or rashes noted. PSYCHIATRIC: The patient has a normal affect.  DATA:  I did review the results of his fistulogram in December 2018  MEDICAL ISSUES: Gust options with the patient.  He is having no difficulty with access or with flow rates currently.  I would recommend against surgical revision at this time since he is having adequate dialysis.  Should he began having inadequate flow levels or difficulty accessing, would recommend surgical revision of his arteriovenous anastomosis.  The agrees with this plan and will see [place] again on an as-needed basis    Rosetta Posner, MD [facility] Specialists of [facility] Tel [phone] Pager [phone]

## 2017-12-06 ENCOUNTER — Encounter: Payer: Self-pay | Admitting: Nephrology

## 2017-12-06 DIAGNOSIS — N2581 Secondary hyperparathyroidism of renal origin: Secondary | ICD-10-CM | POA: Diagnosis not present

## 2017-12-06 DIAGNOSIS — N186 End stage renal disease: Secondary | ICD-10-CM | POA: Diagnosis not present

## 2017-12-08 DIAGNOSIS — N186 End stage renal disease: Secondary | ICD-10-CM | POA: Diagnosis not present

## 2017-12-08 DIAGNOSIS — N2581 Secondary hyperparathyroidism of renal origin: Secondary | ICD-10-CM | POA: Diagnosis not present

## 2017-12-11 DIAGNOSIS — N2581 Secondary hyperparathyroidism of renal origin: Secondary | ICD-10-CM | POA: Diagnosis not present

## 2017-12-11 DIAGNOSIS — N186 End stage renal disease: Secondary | ICD-10-CM | POA: Diagnosis not present

## 2017-12-13 DIAGNOSIS — N186 End stage renal disease: Secondary | ICD-10-CM | POA: Diagnosis not present

## 2017-12-13 DIAGNOSIS — N2581 Secondary hyperparathyroidism of renal origin: Secondary | ICD-10-CM | POA: Diagnosis not present

## 2017-12-15 DIAGNOSIS — N2581 Secondary hyperparathyroidism of renal origin: Secondary | ICD-10-CM | POA: Diagnosis not present

## 2017-12-15 DIAGNOSIS — N186 End stage renal disease: Secondary | ICD-10-CM | POA: Diagnosis not present

## 2017-12-18 DIAGNOSIS — N186 End stage renal disease: Secondary | ICD-10-CM | POA: Diagnosis not present

## 2017-12-18 DIAGNOSIS — N2581 Secondary hyperparathyroidism of renal origin: Secondary | ICD-10-CM | POA: Diagnosis not present

## 2017-12-20 DIAGNOSIS — N2581 Secondary hyperparathyroidism of renal origin: Secondary | ICD-10-CM | POA: Diagnosis not present

## 2017-12-20 DIAGNOSIS — N186 End stage renal disease: Secondary | ICD-10-CM | POA: Diagnosis not present

## 2017-12-22 DIAGNOSIS — I120 Hypertensive chronic kidney disease with stage 5 chronic kidney disease or end stage renal disease: Secondary | ICD-10-CM | POA: Diagnosis not present

## 2017-12-22 DIAGNOSIS — Z992 Dependence on renal dialysis: Secondary | ICD-10-CM | POA: Diagnosis not present

## 2017-12-22 DIAGNOSIS — N2581 Secondary hyperparathyroidism of renal origin: Secondary | ICD-10-CM | POA: Diagnosis not present

## 2017-12-22 DIAGNOSIS — N186 End stage renal disease: Secondary | ICD-10-CM | POA: Diagnosis not present

## 2017-12-25 DIAGNOSIS — N186 End stage renal disease: Secondary | ICD-10-CM | POA: Diagnosis not present

## 2017-12-25 DIAGNOSIS — N2581 Secondary hyperparathyroidism of renal origin: Secondary | ICD-10-CM | POA: Diagnosis not present

## 2017-12-27 DIAGNOSIS — N186 End stage renal disease: Secondary | ICD-10-CM | POA: Diagnosis not present

## 2017-12-27 DIAGNOSIS — N2581 Secondary hyperparathyroidism of renal origin: Secondary | ICD-10-CM | POA: Diagnosis not present

## 2017-12-29 DIAGNOSIS — N186 End stage renal disease: Secondary | ICD-10-CM | POA: Diagnosis not present

## 2017-12-29 DIAGNOSIS — N2581 Secondary hyperparathyroidism of renal origin: Secondary | ICD-10-CM | POA: Diagnosis not present

## 2018-01-01 DIAGNOSIS — N186 End stage renal disease: Secondary | ICD-10-CM | POA: Diagnosis not present

## 2018-01-01 DIAGNOSIS — N2581 Secondary hyperparathyroidism of renal origin: Secondary | ICD-10-CM | POA: Diagnosis not present

## 2018-01-03 DIAGNOSIS — N2581 Secondary hyperparathyroidism of renal origin: Secondary | ICD-10-CM | POA: Diagnosis not present

## 2018-01-03 DIAGNOSIS — N186 End stage renal disease: Secondary | ICD-10-CM | POA: Diagnosis not present

## 2018-01-05 DIAGNOSIS — N2581 Secondary hyperparathyroidism of renal origin: Secondary | ICD-10-CM | POA: Diagnosis not present

## 2018-01-05 DIAGNOSIS — N186 End stage renal disease: Secondary | ICD-10-CM | POA: Diagnosis not present

## 2018-01-08 DIAGNOSIS — N2581 Secondary hyperparathyroidism of renal origin: Secondary | ICD-10-CM | POA: Diagnosis not present

## 2018-01-08 DIAGNOSIS — N186 End stage renal disease: Secondary | ICD-10-CM | POA: Diagnosis not present

## 2018-01-10 DIAGNOSIS — N2581 Secondary hyperparathyroidism of renal origin: Secondary | ICD-10-CM | POA: Diagnosis not present

## 2018-01-10 DIAGNOSIS — N186 End stage renal disease: Secondary | ICD-10-CM | POA: Diagnosis not present

## 2018-01-12 DIAGNOSIS — N2581 Secondary hyperparathyroidism of renal origin: Secondary | ICD-10-CM | POA: Diagnosis not present

## 2018-01-12 DIAGNOSIS — N186 End stage renal disease: Secondary | ICD-10-CM | POA: Diagnosis not present

## 2018-01-15 DIAGNOSIS — N186 End stage renal disease: Secondary | ICD-10-CM | POA: Diagnosis not present

## 2018-01-15 DIAGNOSIS — N2581 Secondary hyperparathyroidism of renal origin: Secondary | ICD-10-CM | POA: Diagnosis not present

## 2018-01-17 DIAGNOSIS — N2581 Secondary hyperparathyroidism of renal origin: Secondary | ICD-10-CM | POA: Diagnosis not present

## 2018-01-17 DIAGNOSIS — N186 End stage renal disease: Secondary | ICD-10-CM | POA: Diagnosis not present

## 2018-01-19 DIAGNOSIS — N186 End stage renal disease: Secondary | ICD-10-CM | POA: Diagnosis not present

## 2018-01-19 DIAGNOSIS — N2581 Secondary hyperparathyroidism of renal origin: Secondary | ICD-10-CM | POA: Diagnosis not present

## 2018-01-22 DIAGNOSIS — N186 End stage renal disease: Secondary | ICD-10-CM | POA: Diagnosis not present

## 2018-01-22 DIAGNOSIS — N2581 Secondary hyperparathyroidism of renal origin: Secondary | ICD-10-CM | POA: Diagnosis not present

## 2018-01-22 DIAGNOSIS — I120 Hypertensive chronic kidney disease with stage 5 chronic kidney disease or end stage renal disease: Secondary | ICD-10-CM | POA: Diagnosis not present

## 2018-01-22 DIAGNOSIS — Z992 Dependence on renal dialysis: Secondary | ICD-10-CM | POA: Diagnosis not present

## 2018-01-23 DIAGNOSIS — I871 Compression of vein: Secondary | ICD-10-CM | POA: Diagnosis not present

## 2018-01-23 DIAGNOSIS — Z992 Dependence on renal dialysis: Secondary | ICD-10-CM | POA: Diagnosis not present

## 2018-01-23 DIAGNOSIS — N186 End stage renal disease: Secondary | ICD-10-CM | POA: Diagnosis not present

## 2018-01-24 DIAGNOSIS — N2581 Secondary hyperparathyroidism of renal origin: Secondary | ICD-10-CM | POA: Diagnosis not present

## 2018-01-24 DIAGNOSIS — N186 End stage renal disease: Secondary | ICD-10-CM | POA: Diagnosis not present

## 2018-01-26 DIAGNOSIS — N186 End stage renal disease: Secondary | ICD-10-CM | POA: Diagnosis not present

## 2018-01-26 DIAGNOSIS — N2581 Secondary hyperparathyroidism of renal origin: Secondary | ICD-10-CM | POA: Diagnosis not present

## 2018-01-29 DIAGNOSIS — N2581 Secondary hyperparathyroidism of renal origin: Secondary | ICD-10-CM | POA: Diagnosis not present

## 2018-01-29 DIAGNOSIS — N186 End stage renal disease: Secondary | ICD-10-CM | POA: Diagnosis not present

## 2018-01-31 DIAGNOSIS — N186 End stage renal disease: Secondary | ICD-10-CM | POA: Diagnosis not present

## 2018-01-31 DIAGNOSIS — N2581 Secondary hyperparathyroidism of renal origin: Secondary | ICD-10-CM | POA: Diagnosis not present

## 2018-02-02 DIAGNOSIS — N186 End stage renal disease: Secondary | ICD-10-CM | POA: Diagnosis not present

## 2018-02-02 DIAGNOSIS — N2581 Secondary hyperparathyroidism of renal origin: Secondary | ICD-10-CM | POA: Diagnosis not present

## 2018-02-05 DIAGNOSIS — N186 End stage renal disease: Secondary | ICD-10-CM | POA: Diagnosis not present

## 2018-02-05 DIAGNOSIS — N2581 Secondary hyperparathyroidism of renal origin: Secondary | ICD-10-CM | POA: Diagnosis not present

## 2018-02-07 DIAGNOSIS — N2581 Secondary hyperparathyroidism of renal origin: Secondary | ICD-10-CM | POA: Diagnosis not present

## 2018-02-07 DIAGNOSIS — N186 End stage renal disease: Secondary | ICD-10-CM | POA: Diagnosis not present

## 2018-02-09 DIAGNOSIS — N2581 Secondary hyperparathyroidism of renal origin: Secondary | ICD-10-CM | POA: Diagnosis not present

## 2018-02-09 DIAGNOSIS — N186 End stage renal disease: Secondary | ICD-10-CM | POA: Diagnosis not present

## 2018-02-12 DIAGNOSIS — N2581 Secondary hyperparathyroidism of renal origin: Secondary | ICD-10-CM | POA: Diagnosis not present

## 2018-02-12 DIAGNOSIS — N186 End stage renal disease: Secondary | ICD-10-CM | POA: Diagnosis not present

## 2018-02-14 DIAGNOSIS — N2581 Secondary hyperparathyroidism of renal origin: Secondary | ICD-10-CM | POA: Diagnosis not present

## 2018-02-14 DIAGNOSIS — N186 End stage renal disease: Secondary | ICD-10-CM | POA: Diagnosis not present

## 2018-02-16 DIAGNOSIS — N2581 Secondary hyperparathyroidism of renal origin: Secondary | ICD-10-CM | POA: Diagnosis not present

## 2018-02-16 DIAGNOSIS — N186 End stage renal disease: Secondary | ICD-10-CM | POA: Diagnosis not present

## 2018-02-19 DIAGNOSIS — N2581 Secondary hyperparathyroidism of renal origin: Secondary | ICD-10-CM | POA: Diagnosis not present

## 2018-02-19 DIAGNOSIS — N186 End stage renal disease: Secondary | ICD-10-CM | POA: Diagnosis not present

## 2018-02-21 DIAGNOSIS — Z992 Dependence on renal dialysis: Secondary | ICD-10-CM | POA: Diagnosis not present

## 2018-02-21 DIAGNOSIS — N186 End stage renal disease: Secondary | ICD-10-CM | POA: Diagnosis not present

## 2018-02-21 DIAGNOSIS — N2581 Secondary hyperparathyroidism of renal origin: Secondary | ICD-10-CM | POA: Diagnosis not present

## 2018-02-21 DIAGNOSIS — I120 Hypertensive chronic kidney disease with stage 5 chronic kidney disease or end stage renal disease: Secondary | ICD-10-CM | POA: Diagnosis not present

## 2018-02-23 DIAGNOSIS — N186 End stage renal disease: Secondary | ICD-10-CM | POA: Diagnosis not present

## 2018-02-23 DIAGNOSIS — N2581 Secondary hyperparathyroidism of renal origin: Secondary | ICD-10-CM | POA: Diagnosis not present

## 2018-02-26 DIAGNOSIS — N2581 Secondary hyperparathyroidism of renal origin: Secondary | ICD-10-CM | POA: Diagnosis not present

## 2018-02-26 DIAGNOSIS — N186 End stage renal disease: Secondary | ICD-10-CM | POA: Diagnosis not present

## 2018-02-28 DIAGNOSIS — N2581 Secondary hyperparathyroidism of renal origin: Secondary | ICD-10-CM | POA: Diagnosis not present

## 2018-02-28 DIAGNOSIS — N186 End stage renal disease: Secondary | ICD-10-CM | POA: Diagnosis not present

## 2018-03-02 DIAGNOSIS — N2581 Secondary hyperparathyroidism of renal origin: Secondary | ICD-10-CM | POA: Diagnosis not present

## 2018-03-02 DIAGNOSIS — N186 End stage renal disease: Secondary | ICD-10-CM | POA: Diagnosis not present

## 2018-03-05 DIAGNOSIS — N186 End stage renal disease: Secondary | ICD-10-CM | POA: Diagnosis not present

## 2018-03-05 DIAGNOSIS — N2581 Secondary hyperparathyroidism of renal origin: Secondary | ICD-10-CM | POA: Diagnosis not present

## 2018-03-07 DIAGNOSIS — N186 End stage renal disease: Secondary | ICD-10-CM | POA: Diagnosis not present

## 2018-03-07 DIAGNOSIS — N2581 Secondary hyperparathyroidism of renal origin: Secondary | ICD-10-CM | POA: Diagnosis not present

## 2018-03-09 DIAGNOSIS — N2581 Secondary hyperparathyroidism of renal origin: Secondary | ICD-10-CM | POA: Diagnosis not present

## 2018-03-09 DIAGNOSIS — N186 End stage renal disease: Secondary | ICD-10-CM | POA: Diagnosis not present

## 2018-03-12 DIAGNOSIS — N186 End stage renal disease: Secondary | ICD-10-CM | POA: Diagnosis not present

## 2018-03-12 DIAGNOSIS — N2581 Secondary hyperparathyroidism of renal origin: Secondary | ICD-10-CM | POA: Diagnosis not present

## 2018-03-14 DIAGNOSIS — N2581 Secondary hyperparathyroidism of renal origin: Secondary | ICD-10-CM | POA: Diagnosis not present

## 2018-03-14 DIAGNOSIS — N186 End stage renal disease: Secondary | ICD-10-CM | POA: Diagnosis not present

## 2018-03-16 DIAGNOSIS — N186 End stage renal disease: Secondary | ICD-10-CM | POA: Diagnosis not present

## 2018-03-16 DIAGNOSIS — N2581 Secondary hyperparathyroidism of renal origin: Secondary | ICD-10-CM | POA: Diagnosis not present

## 2018-03-19 DIAGNOSIS — N186 End stage renal disease: Secondary | ICD-10-CM | POA: Diagnosis not present

## 2018-03-19 DIAGNOSIS — N2581 Secondary hyperparathyroidism of renal origin: Secondary | ICD-10-CM | POA: Diagnosis not present

## 2018-03-21 DIAGNOSIS — N2581 Secondary hyperparathyroidism of renal origin: Secondary | ICD-10-CM | POA: Diagnosis not present

## 2018-03-21 DIAGNOSIS — N186 End stage renal disease: Secondary | ICD-10-CM | POA: Diagnosis not present

## 2018-03-23 DIAGNOSIS — N2581 Secondary hyperparathyroidism of renal origin: Secondary | ICD-10-CM | POA: Diagnosis not present

## 2018-03-23 DIAGNOSIS — N186 End stage renal disease: Secondary | ICD-10-CM | POA: Diagnosis not present

## 2018-03-24 DIAGNOSIS — I120 Hypertensive chronic kidney disease with stage 5 chronic kidney disease or end stage renal disease: Secondary | ICD-10-CM | POA: Diagnosis not present

## 2018-03-24 DIAGNOSIS — Z992 Dependence on renal dialysis: Secondary | ICD-10-CM | POA: Diagnosis not present

## 2018-03-24 DIAGNOSIS — N186 End stage renal disease: Secondary | ICD-10-CM | POA: Diagnosis not present

## 2018-03-26 DIAGNOSIS — N2581 Secondary hyperparathyroidism of renal origin: Secondary | ICD-10-CM | POA: Diagnosis not present

## 2018-03-26 DIAGNOSIS — N186 End stage renal disease: Secondary | ICD-10-CM | POA: Diagnosis not present

## 2018-03-28 DIAGNOSIS — N186 End stage renal disease: Secondary | ICD-10-CM | POA: Diagnosis not present

## 2018-03-28 DIAGNOSIS — N2581 Secondary hyperparathyroidism of renal origin: Secondary | ICD-10-CM | POA: Diagnosis not present

## 2018-03-30 DIAGNOSIS — N186 End stage renal disease: Secondary | ICD-10-CM | POA: Diagnosis not present

## 2018-03-30 DIAGNOSIS — N2581 Secondary hyperparathyroidism of renal origin: Secondary | ICD-10-CM | POA: Diagnosis not present

## 2018-04-02 DIAGNOSIS — N2581 Secondary hyperparathyroidism of renal origin: Secondary | ICD-10-CM | POA: Diagnosis not present

## 2018-04-02 DIAGNOSIS — N186 End stage renal disease: Secondary | ICD-10-CM | POA: Diagnosis not present

## 2018-04-04 DIAGNOSIS — N186 End stage renal disease: Secondary | ICD-10-CM | POA: Diagnosis not present

## 2018-04-04 DIAGNOSIS — N2581 Secondary hyperparathyroidism of renal origin: Secondary | ICD-10-CM | POA: Diagnosis not present

## 2018-04-06 DIAGNOSIS — N186 End stage renal disease: Secondary | ICD-10-CM | POA: Diagnosis not present

## 2018-04-06 DIAGNOSIS — N2581 Secondary hyperparathyroidism of renal origin: Secondary | ICD-10-CM | POA: Diagnosis not present

## 2018-04-09 DIAGNOSIS — N186 End stage renal disease: Secondary | ICD-10-CM | POA: Diagnosis not present

## 2018-04-09 DIAGNOSIS — N2581 Secondary hyperparathyroidism of renal origin: Secondary | ICD-10-CM | POA: Diagnosis not present

## 2018-04-11 DIAGNOSIS — N2581 Secondary hyperparathyroidism of renal origin: Secondary | ICD-10-CM | POA: Diagnosis not present

## 2018-04-11 DIAGNOSIS — N186 End stage renal disease: Secondary | ICD-10-CM | POA: Diagnosis not present

## 2018-04-13 DIAGNOSIS — N186 End stage renal disease: Secondary | ICD-10-CM | POA: Diagnosis not present

## 2018-04-13 DIAGNOSIS — N2581 Secondary hyperparathyroidism of renal origin: Secondary | ICD-10-CM | POA: Diagnosis not present

## 2018-04-14 DIAGNOSIS — E8779 Other fluid overload: Secondary | ICD-10-CM | POA: Diagnosis not present

## 2018-04-14 DIAGNOSIS — N2581 Secondary hyperparathyroidism of renal origin: Secondary | ICD-10-CM | POA: Diagnosis not present

## 2018-04-14 DIAGNOSIS — N186 End stage renal disease: Secondary | ICD-10-CM | POA: Diagnosis not present

## 2018-04-16 DIAGNOSIS — N2581 Secondary hyperparathyroidism of renal origin: Secondary | ICD-10-CM | POA: Diagnosis not present

## 2018-04-16 DIAGNOSIS — N186 End stage renal disease: Secondary | ICD-10-CM | POA: Diagnosis not present

## 2018-04-18 DIAGNOSIS — N186 End stage renal disease: Secondary | ICD-10-CM | POA: Diagnosis not present

## 2018-04-18 DIAGNOSIS — N2581 Secondary hyperparathyroidism of renal origin: Secondary | ICD-10-CM | POA: Diagnosis not present

## 2018-04-20 DIAGNOSIS — N2581 Secondary hyperparathyroidism of renal origin: Secondary | ICD-10-CM | POA: Diagnosis not present

## 2018-04-20 DIAGNOSIS — N186 End stage renal disease: Secondary | ICD-10-CM | POA: Diagnosis not present

## 2018-04-23 DIAGNOSIS — I120 Hypertensive chronic kidney disease with stage 5 chronic kidney disease or end stage renal disease: Secondary | ICD-10-CM | POA: Diagnosis not present

## 2018-04-23 DIAGNOSIS — N2581 Secondary hyperparathyroidism of renal origin: Secondary | ICD-10-CM | POA: Diagnosis not present

## 2018-04-23 DIAGNOSIS — N186 End stage renal disease: Secondary | ICD-10-CM | POA: Diagnosis not present

## 2018-04-23 DIAGNOSIS — Z992 Dependence on renal dialysis: Secondary | ICD-10-CM | POA: Diagnosis not present

## 2018-04-25 DIAGNOSIS — N186 End stage renal disease: Secondary | ICD-10-CM | POA: Diagnosis not present

## 2018-04-25 DIAGNOSIS — N2581 Secondary hyperparathyroidism of renal origin: Secondary | ICD-10-CM | POA: Diagnosis not present

## 2018-04-27 DIAGNOSIS — N186 End stage renal disease: Secondary | ICD-10-CM | POA: Diagnosis not present

## 2018-04-27 DIAGNOSIS — N2581 Secondary hyperparathyroidism of renal origin: Secondary | ICD-10-CM | POA: Diagnosis not present

## 2018-04-30 DIAGNOSIS — N2581 Secondary hyperparathyroidism of renal origin: Secondary | ICD-10-CM | POA: Diagnosis not present

## 2018-04-30 DIAGNOSIS — N186 End stage renal disease: Secondary | ICD-10-CM | POA: Diagnosis not present

## 2018-05-02 DIAGNOSIS — N186 End stage renal disease: Secondary | ICD-10-CM | POA: Diagnosis not present

## 2018-05-02 DIAGNOSIS — N2581 Secondary hyperparathyroidism of renal origin: Secondary | ICD-10-CM | POA: Diagnosis not present

## 2018-05-04 DIAGNOSIS — N2581 Secondary hyperparathyroidism of renal origin: Secondary | ICD-10-CM | POA: Diagnosis not present

## 2018-05-04 DIAGNOSIS — N186 End stage renal disease: Secondary | ICD-10-CM | POA: Diagnosis not present

## 2018-05-07 DIAGNOSIS — N186 End stage renal disease: Secondary | ICD-10-CM | POA: Diagnosis not present

## 2018-05-07 DIAGNOSIS — N2581 Secondary hyperparathyroidism of renal origin: Secondary | ICD-10-CM | POA: Diagnosis not present

## 2018-05-09 DIAGNOSIS — N2581 Secondary hyperparathyroidism of renal origin: Secondary | ICD-10-CM | POA: Diagnosis not present

## 2018-05-09 DIAGNOSIS — N186 End stage renal disease: Secondary | ICD-10-CM | POA: Diagnosis not present

## 2018-05-11 DIAGNOSIS — N2581 Secondary hyperparathyroidism of renal origin: Secondary | ICD-10-CM | POA: Diagnosis not present

## 2018-05-11 DIAGNOSIS — N186 End stage renal disease: Secondary | ICD-10-CM | POA: Diagnosis not present

## 2018-05-14 DIAGNOSIS — N2581 Secondary hyperparathyroidism of renal origin: Secondary | ICD-10-CM | POA: Diagnosis not present

## 2018-05-14 DIAGNOSIS — N186 End stage renal disease: Secondary | ICD-10-CM | POA: Diagnosis not present

## 2018-05-16 DIAGNOSIS — N186 End stage renal disease: Secondary | ICD-10-CM | POA: Diagnosis not present

## 2018-05-16 DIAGNOSIS — N2581 Secondary hyperparathyroidism of renal origin: Secondary | ICD-10-CM | POA: Diagnosis not present

## 2018-05-18 DIAGNOSIS — N186 End stage renal disease: Secondary | ICD-10-CM | POA: Diagnosis not present

## 2018-05-18 DIAGNOSIS — N2581 Secondary hyperparathyroidism of renal origin: Secondary | ICD-10-CM | POA: Diagnosis not present

## 2018-05-21 DIAGNOSIS — Z8614 Personal history of Methicillin resistant Staphylococcus aureus infection: Secondary | ICD-10-CM | POA: Diagnosis not present

## 2018-05-21 DIAGNOSIS — N186 End stage renal disease: Secondary | ICD-10-CM | POA: Diagnosis not present

## 2018-05-21 DIAGNOSIS — N2581 Secondary hyperparathyroidism of renal origin: Secondary | ICD-10-CM | POA: Diagnosis not present

## 2018-05-23 DIAGNOSIS — N186 End stage renal disease: Secondary | ICD-10-CM | POA: Diagnosis not present

## 2018-05-23 DIAGNOSIS — N2581 Secondary hyperparathyroidism of renal origin: Secondary | ICD-10-CM | POA: Diagnosis not present

## 2018-05-24 DIAGNOSIS — Z992 Dependence on renal dialysis: Secondary | ICD-10-CM | POA: Diagnosis not present

## 2018-05-24 DIAGNOSIS — I120 Hypertensive chronic kidney disease with stage 5 chronic kidney disease or end stage renal disease: Secondary | ICD-10-CM | POA: Diagnosis not present

## 2018-05-24 DIAGNOSIS — N186 End stage renal disease: Secondary | ICD-10-CM | POA: Diagnosis not present

## 2018-05-25 DIAGNOSIS — N2581 Secondary hyperparathyroidism of renal origin: Secondary | ICD-10-CM | POA: Diagnosis not present

## 2018-05-25 DIAGNOSIS — N186 End stage renal disease: Secondary | ICD-10-CM | POA: Diagnosis not present

## 2018-05-28 DIAGNOSIS — N186 End stage renal disease: Secondary | ICD-10-CM | POA: Diagnosis not present

## 2018-05-28 DIAGNOSIS — N2581 Secondary hyperparathyroidism of renal origin: Secondary | ICD-10-CM | POA: Diagnosis not present

## 2018-05-30 DIAGNOSIS — N2581 Secondary hyperparathyroidism of renal origin: Secondary | ICD-10-CM | POA: Diagnosis not present

## 2018-05-30 DIAGNOSIS — N186 End stage renal disease: Secondary | ICD-10-CM | POA: Diagnosis not present

## 2018-06-01 DIAGNOSIS — N186 End stage renal disease: Secondary | ICD-10-CM | POA: Diagnosis not present

## 2018-06-01 DIAGNOSIS — N2581 Secondary hyperparathyroidism of renal origin: Secondary | ICD-10-CM | POA: Diagnosis not present

## 2018-06-04 DIAGNOSIS — N186 End stage renal disease: Secondary | ICD-10-CM | POA: Diagnosis not present

## 2018-06-04 DIAGNOSIS — N2581 Secondary hyperparathyroidism of renal origin: Secondary | ICD-10-CM | POA: Diagnosis not present

## 2018-06-06 DIAGNOSIS — N2581 Secondary hyperparathyroidism of renal origin: Secondary | ICD-10-CM | POA: Diagnosis not present

## 2018-06-06 DIAGNOSIS — N186 End stage renal disease: Secondary | ICD-10-CM | POA: Diagnosis not present

## 2018-06-08 DIAGNOSIS — N186 End stage renal disease: Secondary | ICD-10-CM | POA: Diagnosis not present

## 2018-06-08 DIAGNOSIS — N2581 Secondary hyperparathyroidism of renal origin: Secondary | ICD-10-CM | POA: Diagnosis not present

## 2018-06-11 DIAGNOSIS — N2581 Secondary hyperparathyroidism of renal origin: Secondary | ICD-10-CM | POA: Diagnosis not present

## 2018-06-11 DIAGNOSIS — N186 End stage renal disease: Secondary | ICD-10-CM | POA: Diagnosis not present

## 2018-06-13 DIAGNOSIS — N2581 Secondary hyperparathyroidism of renal origin: Secondary | ICD-10-CM | POA: Diagnosis not present

## 2018-06-13 DIAGNOSIS — N186 End stage renal disease: Secondary | ICD-10-CM | POA: Diagnosis not present

## 2018-06-15 DIAGNOSIS — N186 End stage renal disease: Secondary | ICD-10-CM | POA: Diagnosis not present

## 2018-06-15 DIAGNOSIS — N2581 Secondary hyperparathyroidism of renal origin: Secondary | ICD-10-CM | POA: Diagnosis not present

## 2018-06-18 DIAGNOSIS — N186 End stage renal disease: Secondary | ICD-10-CM | POA: Diagnosis not present

## 2018-06-18 DIAGNOSIS — N2581 Secondary hyperparathyroidism of renal origin: Secondary | ICD-10-CM | POA: Diagnosis not present

## 2018-06-20 DIAGNOSIS — N2581 Secondary hyperparathyroidism of renal origin: Secondary | ICD-10-CM | POA: Diagnosis not present

## 2018-06-20 DIAGNOSIS — N186 End stage renal disease: Secondary | ICD-10-CM | POA: Diagnosis not present

## 2018-06-22 DIAGNOSIS — N2581 Secondary hyperparathyroidism of renal origin: Secondary | ICD-10-CM | POA: Diagnosis not present

## 2018-06-22 DIAGNOSIS — N186 End stage renal disease: Secondary | ICD-10-CM | POA: Diagnosis not present

## 2018-06-24 DIAGNOSIS — N186 End stage renal disease: Secondary | ICD-10-CM | POA: Diagnosis not present

## 2018-06-24 DIAGNOSIS — Z992 Dependence on renal dialysis: Secondary | ICD-10-CM | POA: Diagnosis not present

## 2018-06-24 DIAGNOSIS — I120 Hypertensive chronic kidney disease with stage 5 chronic kidney disease or end stage renal disease: Secondary | ICD-10-CM | POA: Diagnosis not present

## 2018-06-25 DIAGNOSIS — N2581 Secondary hyperparathyroidism of renal origin: Secondary | ICD-10-CM | POA: Diagnosis not present

## 2018-06-25 DIAGNOSIS — N186 End stage renal disease: Secondary | ICD-10-CM | POA: Diagnosis not present

## 2018-06-27 DIAGNOSIS — N186 End stage renal disease: Secondary | ICD-10-CM | POA: Diagnosis not present

## 2018-06-27 DIAGNOSIS — N2581 Secondary hyperparathyroidism of renal origin: Secondary | ICD-10-CM | POA: Diagnosis not present

## 2018-06-29 DIAGNOSIS — N186 End stage renal disease: Secondary | ICD-10-CM | POA: Diagnosis not present

## 2018-06-29 DIAGNOSIS — N2581 Secondary hyperparathyroidism of renal origin: Secondary | ICD-10-CM | POA: Diagnosis not present

## 2018-07-02 DIAGNOSIS — N2581 Secondary hyperparathyroidism of renal origin: Secondary | ICD-10-CM | POA: Diagnosis not present

## 2018-07-02 DIAGNOSIS — N186 End stage renal disease: Secondary | ICD-10-CM | POA: Diagnosis not present

## 2018-07-04 DIAGNOSIS — N186 End stage renal disease: Secondary | ICD-10-CM | POA: Diagnosis not present

## 2018-07-04 DIAGNOSIS — N2581 Secondary hyperparathyroidism of renal origin: Secondary | ICD-10-CM | POA: Diagnosis not present

## 2018-07-06 DIAGNOSIS — N186 End stage renal disease: Secondary | ICD-10-CM | POA: Diagnosis not present

## 2018-07-06 DIAGNOSIS — N2581 Secondary hyperparathyroidism of renal origin: Secondary | ICD-10-CM | POA: Diagnosis not present

## 2018-07-09 DIAGNOSIS — N186 End stage renal disease: Secondary | ICD-10-CM | POA: Diagnosis not present

## 2018-07-09 DIAGNOSIS — N2581 Secondary hyperparathyroidism of renal origin: Secondary | ICD-10-CM | POA: Diagnosis not present

## 2018-07-11 DIAGNOSIS — N186 End stage renal disease: Secondary | ICD-10-CM | POA: Diagnosis not present

## 2018-07-11 DIAGNOSIS — N2581 Secondary hyperparathyroidism of renal origin: Secondary | ICD-10-CM | POA: Diagnosis not present

## 2018-07-13 DIAGNOSIS — N186 End stage renal disease: Secondary | ICD-10-CM | POA: Diagnosis not present

## 2018-07-13 DIAGNOSIS — N2581 Secondary hyperparathyroidism of renal origin: Secondary | ICD-10-CM | POA: Diagnosis not present

## 2018-07-16 DIAGNOSIS — N186 End stage renal disease: Secondary | ICD-10-CM | POA: Diagnosis not present

## 2018-07-16 DIAGNOSIS — N2581 Secondary hyperparathyroidism of renal origin: Secondary | ICD-10-CM | POA: Diagnosis not present

## 2018-07-18 DIAGNOSIS — N186 End stage renal disease: Secondary | ICD-10-CM | POA: Diagnosis not present

## 2018-07-18 DIAGNOSIS — N2581 Secondary hyperparathyroidism of renal origin: Secondary | ICD-10-CM | POA: Diagnosis not present

## 2018-07-20 DIAGNOSIS — N186 End stage renal disease: Secondary | ICD-10-CM | POA: Diagnosis not present

## 2018-07-20 DIAGNOSIS — N2581 Secondary hyperparathyroidism of renal origin: Secondary | ICD-10-CM | POA: Diagnosis not present

## 2018-07-23 DIAGNOSIS — N186 End stage renal disease: Secondary | ICD-10-CM | POA: Diagnosis not present

## 2018-07-23 DIAGNOSIS — N2581 Secondary hyperparathyroidism of renal origin: Secondary | ICD-10-CM | POA: Diagnosis not present

## 2018-07-24 DIAGNOSIS — I120 Hypertensive chronic kidney disease with stage 5 chronic kidney disease or end stage renal disease: Secondary | ICD-10-CM | POA: Diagnosis not present

## 2018-07-24 DIAGNOSIS — Z992 Dependence on renal dialysis: Secondary | ICD-10-CM | POA: Diagnosis not present

## 2018-07-24 DIAGNOSIS — N186 End stage renal disease: Secondary | ICD-10-CM | POA: Diagnosis not present

## 2018-07-25 DIAGNOSIS — D509 Iron deficiency anemia, unspecified: Secondary | ICD-10-CM | POA: Diagnosis not present

## 2018-07-25 DIAGNOSIS — N2581 Secondary hyperparathyroidism of renal origin: Secondary | ICD-10-CM | POA: Diagnosis not present

## 2018-07-25 DIAGNOSIS — N186 End stage renal disease: Secondary | ICD-10-CM | POA: Diagnosis not present

## 2018-07-25 DIAGNOSIS — Z23 Encounter for immunization: Secondary | ICD-10-CM | POA: Diagnosis not present

## 2018-07-27 DIAGNOSIS — N2581 Secondary hyperparathyroidism of renal origin: Secondary | ICD-10-CM | POA: Diagnosis not present

## 2018-07-27 DIAGNOSIS — Z23 Encounter for immunization: Secondary | ICD-10-CM | POA: Diagnosis not present

## 2018-07-27 DIAGNOSIS — N186 End stage renal disease: Secondary | ICD-10-CM | POA: Diagnosis not present

## 2018-07-27 DIAGNOSIS — D509 Iron deficiency anemia, unspecified: Secondary | ICD-10-CM | POA: Diagnosis not present

## 2018-07-30 DIAGNOSIS — D509 Iron deficiency anemia, unspecified: Secondary | ICD-10-CM | POA: Diagnosis not present

## 2018-07-30 DIAGNOSIS — Z23 Encounter for immunization: Secondary | ICD-10-CM | POA: Diagnosis not present

## 2018-07-30 DIAGNOSIS — N186 End stage renal disease: Secondary | ICD-10-CM | POA: Diagnosis not present

## 2018-07-30 DIAGNOSIS — N2581 Secondary hyperparathyroidism of renal origin: Secondary | ICD-10-CM | POA: Diagnosis not present

## 2018-08-01 DIAGNOSIS — N186 End stage renal disease: Secondary | ICD-10-CM | POA: Diagnosis not present

## 2018-08-01 DIAGNOSIS — Z23 Encounter for immunization: Secondary | ICD-10-CM | POA: Diagnosis not present

## 2018-08-01 DIAGNOSIS — N2581 Secondary hyperparathyroidism of renal origin: Secondary | ICD-10-CM | POA: Diagnosis not present

## 2018-08-01 DIAGNOSIS — D509 Iron deficiency anemia, unspecified: Secondary | ICD-10-CM | POA: Diagnosis not present

## 2018-08-03 ENCOUNTER — Encounter (HOSPITAL_COMMUNITY): Payer: Self-pay

## 2018-08-03 ENCOUNTER — Emergency Department (HOSPITAL_COMMUNITY)
Admission: EM | Admit: 2018-08-03 | Discharge: 2018-08-03 | Disposition: A | Discharge status: Home / Self Care | Payer: Medicare Other | Attending: Emergency Medicine | Admitting: Emergency Medicine

## 2018-08-03 DIAGNOSIS — F17290 Nicotine dependence, other tobacco product, uncomplicated: Secondary | ICD-10-CM | POA: Insufficient documentation

## 2018-08-03 DIAGNOSIS — I120 Hypertensive chronic kidney disease with stage 5 chronic kidney disease or end stage renal disease: Secondary | ICD-10-CM | POA: Insufficient documentation

## 2018-08-03 DIAGNOSIS — R600 Localized edema: Secondary | ICD-10-CM | POA: Diagnosis present

## 2018-08-03 DIAGNOSIS — Z79899 Other long term (current) drug therapy: Secondary | ICD-10-CM | POA: Insufficient documentation

## 2018-08-03 DIAGNOSIS — L0231 Cutaneous abscess of buttock: Secondary | ICD-10-CM | POA: Insufficient documentation

## 2018-08-03 DIAGNOSIS — Z23 Encounter for immunization: Secondary | ICD-10-CM | POA: Diagnosis not present

## 2018-08-03 DIAGNOSIS — N2581 Secondary hyperparathyroidism of renal origin: Secondary | ICD-10-CM | POA: Diagnosis not present

## 2018-08-03 DIAGNOSIS — D509 Iron deficiency anemia, unspecified: Secondary | ICD-10-CM | POA: Diagnosis not present

## 2018-08-03 DIAGNOSIS — N186 End stage renal disease: Secondary | ICD-10-CM | POA: Insufficient documentation

## 2018-08-03 MED ORDER — DOXYCYCLINE HYCLATE 100 MG PO TABS
100.0000 mg | ORAL_TABLET | Freq: Once | ORAL | Status: AC
  Administered 2018-08-03: 100 mg via ORAL
  Filled 2018-08-03: qty 1

## 2018-08-03 MED ORDER — DOXYCYCLINE HYCLATE 100 MG PO CAPS: 100.0000 mg | ORAL_CAPSULE | Freq: Two times a day (BID) | ORAL | 0 refills | Status: AC

## 2018-08-03 MED ORDER — LIDOCAINE HCL (PF) 1 % IJ SOLN
5.0000 mL | Freq: Once | INTRAMUSCULAR | Status: DC
  Filled 2018-08-03: qty 5

## 2018-08-03 MED ORDER — DOXYCYCLINE HYCLATE 100 MG PO CAPS: 100.0000 mg | ORAL_CAPSULE | Freq: Two times a day (BID) | ORAL | 0 refills | Status: DC

## 2018-08-03 MED ORDER — ACETAMINOPHEN 500 MG PO TABS
1000.0000 mg | ORAL_TABLET | Freq: Once | ORAL | Status: AC
  Administered 2018-08-03: 1000 mg via ORAL
  Filled 2018-08-03: qty 2

## 2018-08-03 NOTE — ED Triage Notes (Signed)
Pt presents for evaluation of abscess to L buttocks x 2 days. Pt denies fever, chills, redness. Denies hx of same.

## 2018-08-03 NOTE — ED Provider Notes (Signed)
[place] EMERGENCY DEPARTMENT Provider Note   CSN: [id number] Arrival date & time: 08/03/18  1504     History   Chief Complaint Chief Complaint  Patient presents with  . Abscess    HPI [name] is a 47 y.o. male with history of end-stage renal disease on dialysis presenting for left gluteal abscess.  Patient states that abscess first appeared 2 days ago and has increased in size and pain since.  Describes pain as throbbing moderate and constant worsened with palpation and sitting.  Patient states that he has not taken anything for pain.  Patient states that his last dialysis treatment was today.  Patient has history of fevers, nausea vomiting, headache chest pain or abdominal pain.  Patient denies injury or trauma to the area.  Patient states that he feels well apart from pain from abscess.  Patient denies pain with bowel movements.  HPI  Past Medical History:  Diagnosis Date  . Anemia    Iron deficiency  . Aneurysm ([place]) 02/15/2007   Right arm large aneurysm  . Constipation   . End stage renal disease on dialysis ([place])    M- W-F  . GERD (gastroesophageal reflux disease)   . Hemodialysis finding May 2 ,2005   [facility] HD at [facility].  Marland Kitchen History of bronchitis   . Hypertension   . Sickle cell trait [facility])     Patient Active Problem List   Diagnosis Date Noted  . Secondary hyperparathyroidism ([place]) 02/12/2016  . Hyperparathyroidism, secondary ([place]) 02/10/2016  . Closed fracture of left patella 08/24/2015  . Quadriceps tendon rupture 08/24/2015  . Non-healing surgical wound 07/10/2014  . Fever in adult 09/10/2013  . SIRS (systemic inflammatory response syndrome) ([place]) 09/10/2013  . End stage renal disease ([place]) 05/03/2012  . Other complications due to renal dialysis device, implant, and graft 05/03/2012    Past Surgical History:  Procedure Laterality Date  . AV FISTULA PLACEMENT    . AV FISTULA PLACEMENT Left 05/03/2013   Procedure:  ARTERIOVENOUS (AV) FISTULA CREATION;  Surgeon: Rosetta Posner, MD;  Location: [place];  Service: Vascular;  Laterality: Left;  . BASCILIC VEIN TRANSPOSITION Left 11/28/2016   Procedure: BASCILIC VEIN TRANSPOSITION- LEFT ARM;  Surgeon: Rosetta Posner, MD;  Location: [place];  Service: Vascular;  Laterality: Left;  . COLONOSCOPY     Hxl: of  . colonscopy    . FISTULA SUPERFICIALIZATION Left 07/24/2013   Procedure: FISTULA SUPERFICIALIZATION AND LIGATION OF BRANCHES;  Surgeon: Rosetta Posner, MD;  Location: [place];  Service: Vascular;  Laterality: Left;  . FRACTURE SURGERY Left Jul 28, 2015   Petela Tendon and Fx  . INSERTION OF DIALYSIS CATHETER Right 04/22/2013   Procedure: INSERTION OF DIALYSIS CATHETER;  Surgeon: Rosetta Posner, MD;  Location: [place];  Service: Vascular;  Laterality: Right;  . LIGATION OF ARTERIOVENOUS  FISTULA Right 04/22/2013   Procedure: LIGATION OF ARTERIOVENOUS  FISTULA  AND RESECTION OF VENOUS ANEURYSM AND ABSCESS;  Surgeon: Rosetta Posner, MD;  Location: [place];  Service: Vascular;  Laterality: Right;  . PARATHYROIDECTOMY N/A 02/12/2016   Procedure:  TOTAL PARATHYROIDECTOMY AUTOTRANSPLANT TO RIGHT FOREARM ;  Surgeon: Armandina Gemma, MD;  Location: [place];  Service: General;  Laterality: N/A;  . QUADRICEPS TENDON REPAIR Left 08/24/2015   Procedure: REPAIR OF LEFT QUADRICEP TENDON;  Surgeon: Frederik Pear, MD;  Location: [place];  Service: Orthopedics;  Laterality: Left;  . REMOVAL OF A DIALYSIS CATHETER  2014  . TEE WITHOUT CARDIOVERSION N/A 09/13/2013   Procedure: TRANSESOPHAGEAL ECHOCARDIOGRAM (TEE);  Surgeon: Sanda Klein, MD;  Location: [facility] ENDOSCOPY;  Service: Cardiovascular;  Laterality: N/A;       Home Medications    Prior to Admission medications   Medication Sig Start Date End Date Taking? Authorizing Provider  acetaminophen (TYLENOL) 500 MG tablet Take 1,000 mg by mouth every 8 (eight) hours as needed (pain/headache).    Yes [provider]  calcium elemental as carbonate  (BARIATRIC TUMS ULTRA) 400 MG chewable tablet Chew 1,000 mg by mouth 3 (three) times daily after meals.    Yes [provider]  Multiple Vitamins-Minerals (RENAPLEX-D) TABS Take 1 tablet by mouth daily. 07/18/18  Yes [provider]  ranitidine (ZANTAC) 150 MG capsule Take 150 mg by mouth daily as needed for heartburn.    Yes [provider]  sevelamer (RENAGEL) 800 MG tablet Take 1,600-2,400 mg by mouth 3 (three) times daily with meals.   Yes [provider]  b complex-vitamin c-folic acid (NEPHRO-VITE) 0.8 MG TABS Take 1 tablet by mouth daily.     [provider]  doxycycline (VIBRAMYCIN) 100 MG capsule Take 1 capsule (100 mg total) by mouth 2 (two) times daily for 7 days. 08/03/18 08/10/18  Nuala Alpha A, PA-C  midodrine (PROAMATINE) 10 MG tablet Take 10 mg by mouth See admin instructions. Take 1 tablet (10 mg) by mouth prior to dialysis and 1 tablet (10 mg) half way through dialysis - Monday, Wednesday, Friday    [provider]  oxyCODONE-acetaminophen (PERCOCET/ROXICET) 5-325 MG tablet Take 1 tablet by mouth every 6 (six) hours as needed. Patient not taking: Reported on 08/03/2018 11/28/16   Alvia Grove PA-C    Family History Family History  Problem Relation Age of Onset  . Hypertension Mother   . Heart disease Mother        before age 48  . Other Brother        DVT    Social History Social History   Tobacco Use  . Smoking status: Light Tobacco Smoker    Types: Cigars  . Smokeless tobacco: Never Used  . Tobacco comment: pt states he smokes about 2 cigars per year  Substance Use Topics  . Alcohol use: Yes    Comment: rarely  . Drug use: No     Allergies   Tape and Chlorhexidine   Review of Systems Review of Systems  Constitutional: Negative.  Negative for chills and fever.  Respiratory: Negative.  Negative for shortness of breath.   Cardiovascular: Negative.  Negative for chest pain.  Gastrointestinal:  Negative.  Negative for abdominal pain, blood in stool, diarrhea, nausea and vomiting.  Musculoskeletal: Negative.  Negative for arthralgias and myalgias.  Skin:       Left gluteal abscess  Neurological: Negative.  Negative for weakness, numbness and headaches.   Physical Exam Updated Vital Signs BP 108/69 (BP Location: Right Arm)   Pulse (!) 108   Temp 100 F (37.8 C) (Oral)   Resp 18   SpO2 96%   Physical Exam  Constitutional: He is oriented to person, place, and time. He appears well-developed and well-nourished. No distress.  HENT:  Head: Normocephalic and atraumatic.  Right Ear: External ear normal.  Left Ear: External ear normal.  Nose: Nose normal.  Eyes: Pupils are equal, round, and reactive to light. EOM are normal.  Neck: Trachea normal and normal range of motion. No tracheal deviation present.  Pulmonary/Chest:  Effort normal. No respiratory distress.  Abdominal: Soft. There is no tenderness. There is no rebound and no guarding.  Genitourinary:  Genitourinary Comments: Rectal exam chaperoned by Sharyn Lull EMT.  Rectal wall normal, no fluctuance or induration present.  No extension of abscess felt.  Musculoskeletal: Normal range of motion.  Neurological: He is alert and oriented to person, place, and time.  Skin: Skin is warm and dry. Capillary refill takes less than 2 seconds.     Patient with 6 cm x 4 cm left gluteal cleft abscess.  Area is indurated with some fluctuance in the center.  Tender to touch and mildly erythematous.  Streaking present.  Area without extension towards rectum, 2-3 inches of normal tissue between anus and abscess.  Psychiatric: He has a normal mood and affect. His behavior is normal.     ED Treatments / Results  Labs (all labs ordered are listed, but only abnormal results are displayed) Labs Reviewed - No data to display  EKG None  Radiology No results found.  Procedures .Marland KitchenIncision and Drainage Date/Time: 08/03/2018 4:33  PM Performed by: Deliah Boston, PA-C Authorized by: Deliah Boston, PA-C   Consent:    Consent obtained:  Verbal   Consent given by:  Patient   Risks discussed:  Bleeding, incomplete drainage, pain, infection and damage to other organs Location:    Type:  Abscess   Size:  6cm x 4cm   Location:  Lower extremity   Lower extremity location:  Buttock   Buttock location:  L buttock Pre-procedure details:    Skin preparation:  Betadine Anesthesia (see MAR for exact dosages):    Anesthesia method:  Local infiltration   Local anesthetic:  Lidocaine 1% w/o epi Procedure type:    Complexity:  Simple Procedure details:    Incision types:  Single straight   Scalpel blade:  15   Wound management:  Probed and deloculated   Drainage:  Bloody and purulent   Drainage amount:  Moderate   Wound treatment:  Wound left open   Packing materials:  1/4 in iodoform gauze   Amount 1/4" iodoform:  2-3 inches left and wound.  2 inch tail left out. Post-procedure details:    Patient tolerance of procedure:  Tolerated well, no immediate complications Comments:     Wound covered with bacitracin and sterile gauze.   (including critical care time)  Medications Ordered in ED Medications  lidocaine (PF) (XYLOCAINE) 1 % injection 5 mL (has no administration in time range)  acetaminophen (TYLENOL) tablet 1,000 mg (1,000 mg Oral Given 08/03/18 1701)  doxycycline (VIBRA-TABS) tablet 100 mg (100 mg Oral Given 08/03/18 1702)     Initial Impression / Assessment and Plan / ED Course  I have reviewed the triage vital signs and the nursing notes.  Pertinent labs & imaging results that were available during my care of the patient were reviewed by me and considered in my medical decision making (see chart for details).  Clinical Course as of Aug 04 1703  Fri Aug 03, 2018  1545 Rectal examination chaperoned by Sharyn Lull EMT   [BM]  [phone] Patient seen and evaluated by Dr. Lacinda Axon who agrees with incision  and drainage at this time.  Discharge with p.o. Bactrim.   [BM]    Clinical Course User Index [BM] Deliah Boston, PA-C   Patient with single skin abscess amenable to incision and drainage.  Abscess packed as above. Patient informed to have wound recheck in 2 days. I have encouraged  home warm soaks and flushing.  Mild signs of cellulitis is surrounding skin.  Plan to discharge patient. Patient given extensive return precautions.  Patient endorses great relief of symptoms following incision and drainage.  Patient noted to have elevated temperature of 100.0 degrees Fahrenheit and tachycardic to 108.  Patient overall well-appearing/non-toxic and in no acute distress.  Patient has been given first dose of doxycycline and 1 g of Tylenol here in emergency department for low-grade temperature.  Discussed with patient that he is due to his comorbidities he is at higher risk for worsening infection and he will need very close follow-up regarding his abscess today.  Patient states that he wishes to be discharged today with antibiotics and that he will return on Monday for wound recheck.  Patient states that he will return sooner if he feels that his symptoms are worsening or if he measures a fever at home.  Shared decision making with patient was performed, patient to be discharged with doxycycline return in 48 hours for wound recheck and sooner if he feels his symptoms are worsening, not improving or if he has any new symptoms.  Patient appears reasonable for follow-up.  Patient seen and evaluated by Dr. Lacinda Axon.  Agrees with plan of care incision and drainage at this time.  Advises placing patient on doxycycline 100 mg twice daily for the next 7 days due to comorbid condition of dialysis.  At this time there does not appear to be any evidence of an acute emergency medical condition and the patient appears stable for discharge with appropriate outpatient follow up. Diagnosis was discussed with patient who  verbalizes understanding of care plan and is agreeable to discharge. I have discussed return precautions with patient who verbalizes understanding of return precautions. Patient strongly encouraged to follow-up with their PCP. All questions answered.  Note: Portions of this report may have been transcribed using voice recognition software. Every effort was made to ensure accuracy; however, inadvertent computerized transcription errors may still be present.  Final Clinical Impressions(s) / ED Diagnoses   Final diagnoses:  Abscess of left buttock    ED Discharge Orders         Ordered    doxycycline (VIBRAMYCIN) 100 MG capsule  2 times daily     08/03/18 1702           Gari Crown 08/03/18 1704    Nat Christen, MD 08/04/18 [phone]

## 2018-08-03 NOTE — Discharge Instructions (Addendum)
Please return to the Emergency Department for any new or worsening symptoms or if your symptoms do not improve. Please be sure to follow up with your Primary Care Physician as soon as possible regarding your visit today. If you do not have a Primary Doctor please use the resources below to establish one. Please use the antibiotic medication doxycycline as prescribed. You must return to the emergency department in 2 days to have your abscess rechecked to ensure proper healing. Return to the emergency department sooner if you feel your condition is worsening or not improving or if you develop new or concerning symptoms.  Contact a health care provider if: Your cyst or abscess returns. You have a fever. You have more redness, swelling, or pain around your incision. You have more fluid or blood coming from your incision. Your incision feels warm to the touch. You have pus or a bad smell coming from your incision. Get help right away if: You have severe pain or bleeding. You cannot eat or drink without vomiting. You have decreased urine output. You become short of breath. You have chest pain. You cough up blood. The area where the incision and drainage occurred becomes numb or it tingles.  Do not take your medicine if  develop an itchy rash, swelling in your mouth or lips, or difficulty breathing.   RESOURCE GUIDE  Chronic Pain Problems: Contact [facility]  [phone] Patients need to be referred by their primary care doctor.  Insufficient Money for Medicine: Contact United Way:  call "211" or [place] [phone].  No Primary Care Doctor: Call [facility]  [phone] - can help you locate a primary care doctor that  accepts your insurance, provides certain services, etc. Physician Referral Service- [phone]  Agencies that provide inexpensive medical care: [facility]  [facility]  [phone] [facility]  [phone] [facility]  [phone] Planned Parenthood  [phone] [facility]  [phone]  [place] Providers: [facility]- [address], [address]  [phone], Mon-Fri 9am-7pm, Sat 9am-1pm [place], [address] [place]  [place], [address] [place]  [place]- [address]  [place], [address], [phone]  Only accepts [place] patients after they have their name  applied to their card  Self Pay (no insurance) in [facility]: Sickle Cell Patients: Dr Kevan Ny, [facility]  [place], [facility]- [address]  [facility]- [address], [facility]- see information above (Speak to [employer] if you do not have insurance)       -  [facility]- [place], [place],  [address], [phone]       -  Dr Vista Lawman-  [address], [place], [place] [facility] [facility]- [address], [phone]       -  [facility]  [place]rick [place], [place], also [address] [phone]       -    [facility]- [address], [place], 1st & 3rd Saturday   every month, 10am-1pm  1) Find a Doctor and Pay Out of Pocket Although you won't have to find out who is covered by your insurance plan, it is a good idea to ask around and get recommendations. You will then need to call the office and see if the doctor you have chosen will accept you as a new patient and what types of options they offer for patients who are self-pay. Some doctors offer discounts or will set up  payment plans for their patients who do not have insurance, but you will need to ask so you aren't surprised when you get to your appointment.  2) Contact Your Local Health Department Not all health departments have doctors that can see patients for sick visits, but many do, so it is worth a call to see if yours does. If you don't know where your local health department is, you can check in your phone book. The CDC also has a tool to help you locate your state's health department, and many state websites also have listings of all of their local health departments.  3) Find a [facility] If your illness is not likely to be very severe or complicated, you may want to try a walk in clinic. These are popping up all over the country in pharmacies, drugstores, and shopping centers. They're usually staffed by nurse practitioners or physician assistants that have been trained to treat common illnesses and complaints. They're usually fairly quick and inexpensive. However, if you have serious medical issues or chronic medical problems, these are probably not your best option  [facility] [place], [facility], [address], [place], phone [phone] or [phone].  Monday - Friday, call for an appointment. [place], [facility], [address], [place], phone [phone] or [phone].  Monday - Friday, call for an appointment.  Abuse/Neglect: [place] [phone] [place] [phone] (After Hours)  Emergency Shelter:  [facility] [phone]  Maternity Homes: [facility] [phone] [place] [phone]  MRSA Hotline #:   (650)259-6113  [facility] Dept. [address]         [place] Phone:  [phone]                                  Phone:  [phone]  Phone:  [place], [place]- [phone]       -     [facility] in [place], [address]                                  [place] [phone] or [phone] (After Hours)   [place]  Substance Abuse Resources: Alcohol and Drug Services  [place] [phone] [facility] [phone] Residential & Outpatient Substance Abuse Program  [phone]  Psychological Services: [place]  [phone] [place]  [place], [place]. [address] [place], [place]: [phone] or [phone], [employer]  Dental Assistance  If unable to pay or uninsured, contact:  [facility] or [facility]. to become qualified for the adult dental clinic.  Patients with Medicaid: [facility] [phone] [address], [place] [address] [phone]  If unable to pay, or uninsured, contact [facility] [phone]) or [place] [phone] in [place], [place] in [facility]) to become qualified for the adult dental clinic   Other [place]- [place], [place], [place], [address], [place], [place], 2nd and 4th Thursday of the month at 6:30am.  10 clients each day by appointment, can sometimes see walk-in patients if someone does not show for an appointment. [facility]- [address], [place], [place], [address], [place], [place], [place], [address], [place] Department- [phone] [facility] [phone]

## 2018-08-06 ENCOUNTER — Emergency Department (HOSPITAL_COMMUNITY)
Admission: EM | Admit: 2018-08-06 | Discharge: 2018-08-06 | Disposition: A | Discharge status: Home / Self Care | Payer: Medicare Other | Attending: Emergency Medicine | Admitting: Emergency Medicine

## 2018-08-06 ENCOUNTER — Encounter (HOSPITAL_COMMUNITY): Payer: Self-pay | Admitting: Emergency Medicine

## 2018-08-06 DIAGNOSIS — I120 Hypertensive chronic kidney disease with stage 5 chronic kidney disease or end stage renal disease: Secondary | ICD-10-CM | POA: Diagnosis not present

## 2018-08-06 DIAGNOSIS — L0231 Cutaneous abscess of buttock: Secondary | ICD-10-CM

## 2018-08-06 DIAGNOSIS — N2581 Secondary hyperparathyroidism of renal origin: Secondary | ICD-10-CM | POA: Diagnosis not present

## 2018-08-06 DIAGNOSIS — N186 End stage renal disease: Secondary | ICD-10-CM | POA: Diagnosis not present

## 2018-08-06 DIAGNOSIS — F17290 Nicotine dependence, other tobacco product, uncomplicated: Secondary | ICD-10-CM | POA: Diagnosis not present

## 2018-08-06 DIAGNOSIS — Z992 Dependence on renal dialysis: Secondary | ICD-10-CM | POA: Insufficient documentation

## 2018-08-06 DIAGNOSIS — Z79899 Other long term (current) drug therapy: Secondary | ICD-10-CM | POA: Insufficient documentation

## 2018-08-06 DIAGNOSIS — D509 Iron deficiency anemia, unspecified: Secondary | ICD-10-CM | POA: Diagnosis not present

## 2018-08-06 DIAGNOSIS — D573 Sickle-cell trait: Secondary | ICD-10-CM | POA: Insufficient documentation

## 2018-08-06 DIAGNOSIS — Z23 Encounter for immunization: Secondary | ICD-10-CM | POA: Diagnosis not present

## 2018-08-06 DIAGNOSIS — Z5189 Encounter for other specified aftercare: Secondary | ICD-10-CM

## 2018-08-06 NOTE — ED Provider Notes (Signed)
[facility] EMERGENCY DEPARTMENT Provider Note   CSN: [id number] Arrival date & time: 08/06/18  1629     History   Chief Complaint Chief Complaint  Patient presents with  . Follow-up    HPI [name] is a 47 y.o. male with history of ESRD on dialysis Monday Wednesday Friday, GERD, hypertension, sickle cell trait, anemia presents for reevaluation after I&D.  He presented to the ED on 08/03/2018 for evaluation of left gluteal abscess.  He underwent I&D at the time with packing placement.  He states that since that time the packing has fallen out but he has been changing the dressings by applying gauze 2-3 times daily.  He does note purulent drainage which is ongoing but notes significant improvement in his pain and endorses only mild soreness.  He denies fevers, nausea, vomiting, or pain with bowel movements.  He has been compliant with his doxycycline as prescribed.  The history is provided by the patient.    Past Medical History:  Diagnosis Date  . Anemia    Iron deficiency  . Aneurysm ([place]) 02/15/2007   Right arm large aneurysm  . Constipation   . End stage renal disease on dialysis ([place])    M- W-F  . GERD (gastroesophageal reflux disease)   . Hemodialysis finding May 2 ,2005   [facility] HD at [facility].  Marland Kitchen History of bronchitis   . Hypertension   . Sickle cell trait [facility])     Patient Active Problem List   Diagnosis Date Noted  . Secondary hyperparathyroidism ([place]) 02/12/2016  . Hyperparathyroidism, secondary ([place]) 02/10/2016  . Closed fracture of left patella 08/24/2015  . Quadriceps tendon rupture 08/24/2015  . Non-healing surgical wound 07/10/2014  . Fever in adult 09/10/2013  . SIRS (systemic inflammatory response syndrome) ([place]) 09/10/2013  . End stage renal disease ([place]) 05/03/2012  . Other complications due to renal dialysis device, implant, and graft 05/03/2012    Past Surgical History:  Procedure Laterality Date  . AV FISTULA  PLACEMENT    . AV FISTULA PLACEMENT Left 05/03/2013   Procedure: ARTERIOVENOUS (AV) FISTULA CREATION;  Surgeon: Rosetta Posner, MD;  Location: [facility];  Service: Vascular;  Laterality: Left;  . BASCILIC VEIN TRANSPOSITION Left 11/28/2016   Procedure: BASCILIC VEIN TRANSPOSITION- LEFT ARM;  Surgeon: Rosetta Posner, MD;  Location: [facility];  Service: Vascular;  Laterality: Left;  . COLONOSCOPY     Hxl: of  . colonscopy    . FISTULA SUPERFICIALIZATION Left 07/24/2013   Procedure: FISTULA SUPERFICIALIZATION AND LIGATION OF BRANCHES;  Surgeon: Rosetta Posner, MD;  Location: [facility];  Service: Vascular;  Laterality: Left;  . FRACTURE SURGERY Left Jul 28, 2015   Petela Tendon and Fx  . INSERTION OF DIALYSIS CATHETER Right 04/22/2013   Procedure: INSERTION OF DIALYSIS CATHETER;  Surgeon: Rosetta Posner, MD;  Location: [facility];  Service: Vascular;  Laterality: Right;  . LIGATION OF ARTERIOVENOUS  FISTULA Right 04/22/2013   Procedure: LIGATION OF ARTERIOVENOUS  FISTULA  AND RESECTION OF VENOUS ANEURYSM AND ABSCESS;  Surgeon: Rosetta Posner, MD;  Location: [facility];  Service: Vascular;  Laterality: Right;  . PARATHYROIDECTOMY N/A 02/12/2016   Procedure:  TOTAL PARATHYROIDECTOMY AUTOTRANSPLANT TO RIGHT FOREARM ;  Surgeon: Armandina Gemma, MD;  Location: [facility];  Service: General;  Laterality: N/A;  . QUADRICEPS TENDON REPAIR Left 08/24/2015   Procedure: REPAIR OF LEFT QUADRICEP TENDON;  Surgeon: Frederik Pear, MD;  Location: [facility];  Service:  Orthopedics;  Laterality: Left;  . REMOVAL OF A DIALYSIS CATHETER  2014  . TEE WITHOUT CARDIOVERSION N/A 09/13/2013   Procedure: TRANSESOPHAGEAL ECHOCARDIOGRAM (TEE);  Surgeon: Sanda Klein, MD;  [facility];  Service: Cardiovascular;  Laterality: N/A;        Home Medications    Prior to Admission medications   Medication Sig Start Date End Date Taking? Authorizing Provider  acetaminophen (TYLENOL) 500 MG tablet Take 1,000 mg by mouth every 8 (eight) hours as needed (pain or  headaches).     [provider]  b complex-vitamin c-folic acid (NEPHRO-VITE) 0.8 MG TABS Take 1 tablet by mouth daily.     [provider]  calcium elemental as carbonate (BARIATRIC TUMS ULTRA) 400 MG chewable tablet Chew 1,000 mg by mouth 3 (three) times daily after meals.     [provider]  doxycycline (VIBRAMYCIN) 100 MG capsule Take 1 capsule (100 mg total) by mouth 2 (two) times daily for 7 days. 08/03/18 08/10/18  Nuala Alpha A, PA-C  midodrine (PROAMATINE) 10 MG tablet Take 10 mg by mouth See admin instructions. Take 1 tablet (10 mg) by mouth prior to dialysis and 1 tablet (10 mg) half way through dialysis - Monday, Wednesday, Friday    [provider]  Multiple Vitamins-Minerals (RENAPLEX-D) TABS Take 1 tablet by mouth daily. 07/18/18   [provider]  oxyCODONE-acetaminophen (PERCOCET/ROXICET) 5-325 MG tablet Take 1 tablet by mouth every 6 (six) hours as needed. Patient not taking: Reported on 08/03/2018 11/28/16   Virgina Jock A, PA-C  ranitidine (ZANTAC) 150 MG capsule Take 150 mg by mouth daily as needed for heartburn.     [provider]  sevelamer (RENAGEL) 800 MG tablet Take 1,600-2,400 mg by mouth 3 (three) times daily with meals.    [provider]    Family History Family History  Problem Relation Age of Onset  . Hypertension Mother   . Heart disease Mother        before age 4  . Other Brother        DVT    Social History Social History   Tobacco Use  . Smoking status: Light Tobacco Smoker    Types: Cigars  . Smokeless tobacco: Never Used  . Tobacco comment: pt states he smokes about 2 cigars per year  Substance Use Topics  . Alcohol use: Yes    Comment: rarely  . Drug use: No     Allergies   Tape and Chlorhexidine   Review of Systems Review of Systems  Constitutional: Negative for chills and fever.  Gastrointestinal: Negative for abdominal pain, nausea, rectal pain and vomiting.    Skin: Positive for wound.     Physical Exam Updated Vital Signs BP 110/72   Pulse (!) 101   Temp 98.3 F (36.8 C) (Oral)   Resp 18   SpO2 98%   Physical Exam  Constitutional: He appears well-developed and well-nourished. No distress.  HENT:  Head: Normocephalic and atraumatic.  Eyes: Conjunctivae are normal. Right eye exhibits no discharge. Left eye exhibits no discharge.  Neck: No JVD present. No tracheal deviation present.  Cardiovascular: Normal rate.  Pulmonary/Chest: Effort normal.  Abdominal: He exhibits no distension.  Genitourinary:  Genitourinary Comments: Examination performed in the presence of a chaperone.  There is a 1 cm well-healing wound to the left buttock along the gluteal cleft with no observable packing placed.  Not actively draining.  There is also a pinpoint area draining purulent fluid closer  to the rectum but >1cm from the rectum.  There is some surrounding induration but no erythema or warmth.  Rectal examination performed in the presence of a chaperone.  There is no fluctuance or tenderness to palpation.  No anal fissures or tears.  No rectal bleeding noted.  Musculoskeletal: He exhibits no edema.  Neurological: He is alert.  Skin: No erythema.  Psychiatric: He has a normal mood and affect. His behavior is normal.  Nursing note and vitals reviewed.    ED Treatments / Results  Labs (all labs ordered are listed, but only abnormal results are displayed) Labs Reviewed - No data to display  EKG None  Radiology No results found.  Procedures Procedures (including critical care time)  Medications Ordered in ED Medications - No data to display   Initial Impression / Assessment and Plan / ED Course  I have reviewed the triage vital signs and the nursing notes.  Pertinent labs & imaging results that were available during my care of the patient were reviewed by me and considered in my medical decision making (see chart for details).     Patient  presents for wound check after I&D 3 days ago.  He is afebrile, vital signs stable.  He is nontoxic in appearance.  He denies any significant pain to the area.  His dressing and is packing did fall out and he is actively draining some purulent material from another site along the left gluteal cleft.  I would not classify this as a perirectal abscess and he has no fluctuance or pain on rectal examination.  He has been compliant with his antibiotics.  I do not see a need for repeat I&D today as the wound is spontaneously draining well and there is no significant pain.  Recommend follow-up with PCP or general surgery if symptoms persist.  Discussed strict ED return precautions. Pt verbalized understanding of and agreement with plan and is safe for discharge home at this time.   Final Clinical Impressions(s) / ED Diagnoses   Final diagnoses:  Wound check, abscess  Abscess, gluteal, left    ED Discharge Orders    None       Debroah Baller 08/06/18 1943    Valarie Merino, MD 08/14/18 [phone]

## 2018-08-06 NOTE — Discharge Instructions (Signed)
You can take Tylenol as needed for pain. Please take all of your antibiotics until finished!   You may develop abdominal discomfort or diarrhea from the antibiotic.  You may help offset this with probiotics which you can buy or get in yogurt. Do not eat  or take the probiotics until 2 hours after your antibiotic.   You can apply warm compresses throughout the day or cool compresses to help with inflammation.  You can also take a sitz bath which you can purchase at any pharmacy a few times a week.  Keep the area clean and dry.  Follow-up with general surgery your PCP if symptoms persist.  Return to the emergency department if any concerning signs or symptoms develop such as fevers, pain with bowel movements, gluteal pain.

## 2018-08-06 NOTE — ED Triage Notes (Signed)
Pt reports he had a boil on his buttocks lanced last Friday. Pt reports he was told to come back here for follow-up. Pt denies any complications. Pt reports he has been taking his antibiotic.

## 2018-08-08 DIAGNOSIS — Z23 Encounter for immunization: Secondary | ICD-10-CM | POA: Diagnosis not present

## 2018-08-08 DIAGNOSIS — N186 End stage renal disease: Secondary | ICD-10-CM | POA: Diagnosis not present

## 2018-08-08 DIAGNOSIS — D509 Iron deficiency anemia, unspecified: Secondary | ICD-10-CM | POA: Diagnosis not present

## 2018-08-08 DIAGNOSIS — N2581 Secondary hyperparathyroidism of renal origin: Secondary | ICD-10-CM | POA: Diagnosis not present

## 2018-08-10 DIAGNOSIS — D509 Iron deficiency anemia, unspecified: Secondary | ICD-10-CM | POA: Diagnosis not present

## 2018-08-10 DIAGNOSIS — Z23 Encounter for immunization: Secondary | ICD-10-CM | POA: Diagnosis not present

## 2018-08-10 DIAGNOSIS — N186 End stage renal disease: Secondary | ICD-10-CM | POA: Diagnosis not present

## 2018-08-10 DIAGNOSIS — N2581 Secondary hyperparathyroidism of renal origin: Secondary | ICD-10-CM | POA: Diagnosis not present

## 2018-08-13 DIAGNOSIS — N2581 Secondary hyperparathyroidism of renal origin: Secondary | ICD-10-CM | POA: Diagnosis not present

## 2018-08-13 DIAGNOSIS — Z23 Encounter for immunization: Secondary | ICD-10-CM | POA: Diagnosis not present

## 2018-08-13 DIAGNOSIS — N186 End stage renal disease: Secondary | ICD-10-CM | POA: Diagnosis not present

## 2018-08-13 DIAGNOSIS — D509 Iron deficiency anemia, unspecified: Secondary | ICD-10-CM | POA: Diagnosis not present

## 2018-08-15 DIAGNOSIS — Z23 Encounter for immunization: Secondary | ICD-10-CM | POA: Diagnosis not present

## 2018-08-15 DIAGNOSIS — N2581 Secondary hyperparathyroidism of renal origin: Secondary | ICD-10-CM | POA: Diagnosis not present

## 2018-08-15 DIAGNOSIS — N186 End stage renal disease: Secondary | ICD-10-CM | POA: Diagnosis not present

## 2018-08-15 DIAGNOSIS — D509 Iron deficiency anemia, unspecified: Secondary | ICD-10-CM | POA: Diagnosis not present

## 2018-08-17 DIAGNOSIS — N186 End stage renal disease: Secondary | ICD-10-CM | POA: Diagnosis not present

## 2018-08-17 DIAGNOSIS — D509 Iron deficiency anemia, unspecified: Secondary | ICD-10-CM | POA: Diagnosis not present

## 2018-08-17 DIAGNOSIS — Z23 Encounter for immunization: Secondary | ICD-10-CM | POA: Diagnosis not present

## 2018-08-17 DIAGNOSIS — N2581 Secondary hyperparathyroidism of renal origin: Secondary | ICD-10-CM | POA: Diagnosis not present

## 2018-08-20 DIAGNOSIS — D509 Iron deficiency anemia, unspecified: Secondary | ICD-10-CM | POA: Diagnosis not present

## 2018-08-20 DIAGNOSIS — Z23 Encounter for immunization: Secondary | ICD-10-CM | POA: Diagnosis not present

## 2018-08-20 DIAGNOSIS — N2581 Secondary hyperparathyroidism of renal origin: Secondary | ICD-10-CM | POA: Diagnosis not present

## 2018-08-20 DIAGNOSIS — N186 End stage renal disease: Secondary | ICD-10-CM | POA: Diagnosis not present

## 2018-08-22 DIAGNOSIS — D509 Iron deficiency anemia, unspecified: Secondary | ICD-10-CM | POA: Diagnosis not present

## 2018-08-22 DIAGNOSIS — Z23 Encounter for immunization: Secondary | ICD-10-CM | POA: Diagnosis not present

## 2018-08-22 DIAGNOSIS — N2581 Secondary hyperparathyroidism of renal origin: Secondary | ICD-10-CM | POA: Diagnosis not present

## 2018-08-22 DIAGNOSIS — N186 End stage renal disease: Secondary | ICD-10-CM | POA: Diagnosis not present

## 2018-08-24 DIAGNOSIS — Z992 Dependence on renal dialysis: Secondary | ICD-10-CM | POA: Diagnosis not present

## 2018-08-24 DIAGNOSIS — D509 Iron deficiency anemia, unspecified: Secondary | ICD-10-CM | POA: Diagnosis not present

## 2018-08-24 DIAGNOSIS — I120 Hypertensive chronic kidney disease with stage 5 chronic kidney disease or end stage renal disease: Secondary | ICD-10-CM | POA: Diagnosis not present

## 2018-08-24 DIAGNOSIS — N186 End stage renal disease: Secondary | ICD-10-CM | POA: Diagnosis not present

## 2018-08-24 DIAGNOSIS — N2581 Secondary hyperparathyroidism of renal origin: Secondary | ICD-10-CM | POA: Diagnosis not present

## 2018-08-27 DIAGNOSIS — D509 Iron deficiency anemia, unspecified: Secondary | ICD-10-CM | POA: Diagnosis not present

## 2018-08-27 DIAGNOSIS — N186 End stage renal disease: Secondary | ICD-10-CM | POA: Diagnosis not present

## 2018-08-27 DIAGNOSIS — N2581 Secondary hyperparathyroidism of renal origin: Secondary | ICD-10-CM | POA: Diagnosis not present

## 2018-08-29 DIAGNOSIS — N2581 Secondary hyperparathyroidism of renal origin: Secondary | ICD-10-CM | POA: Diagnosis not present

## 2018-08-29 DIAGNOSIS — D509 Iron deficiency anemia, unspecified: Secondary | ICD-10-CM | POA: Diagnosis not present

## 2018-08-29 DIAGNOSIS — N186 End stage renal disease: Secondary | ICD-10-CM | POA: Diagnosis not present

## 2018-08-31 DIAGNOSIS — N186 End stage renal disease: Secondary | ICD-10-CM | POA: Diagnosis not present

## 2018-08-31 DIAGNOSIS — N2581 Secondary hyperparathyroidism of renal origin: Secondary | ICD-10-CM | POA: Diagnosis not present

## 2018-08-31 DIAGNOSIS — D509 Iron deficiency anemia, unspecified: Secondary | ICD-10-CM | POA: Diagnosis not present

## 2018-09-03 DIAGNOSIS — N186 End stage renal disease: Secondary | ICD-10-CM | POA: Diagnosis not present

## 2018-09-03 DIAGNOSIS — D509 Iron deficiency anemia, unspecified: Secondary | ICD-10-CM | POA: Diagnosis not present

## 2018-09-03 DIAGNOSIS — N2581 Secondary hyperparathyroidism of renal origin: Secondary | ICD-10-CM | POA: Diagnosis not present

## 2018-09-04 DIAGNOSIS — I871 Compression of vein: Secondary | ICD-10-CM | POA: Diagnosis not present

## 2018-09-04 DIAGNOSIS — N186 End stage renal disease: Secondary | ICD-10-CM | POA: Diagnosis not present

## 2018-09-04 DIAGNOSIS — Z992 Dependence on renal dialysis: Secondary | ICD-10-CM | POA: Diagnosis not present

## 2018-09-05 DIAGNOSIS — D509 Iron deficiency anemia, unspecified: Secondary | ICD-10-CM | POA: Diagnosis not present

## 2018-09-05 DIAGNOSIS — N2581 Secondary hyperparathyroidism of renal origin: Secondary | ICD-10-CM | POA: Diagnosis not present

## 2018-09-05 DIAGNOSIS — N186 End stage renal disease: Secondary | ICD-10-CM | POA: Diagnosis not present

## 2018-09-07 DIAGNOSIS — D509 Iron deficiency anemia, unspecified: Secondary | ICD-10-CM | POA: Diagnosis not present

## 2018-09-07 DIAGNOSIS — N2581 Secondary hyperparathyroidism of renal origin: Secondary | ICD-10-CM | POA: Diagnosis not present

## 2018-09-07 DIAGNOSIS — N186 End stage renal disease: Secondary | ICD-10-CM | POA: Diagnosis not present

## 2018-09-10 DIAGNOSIS — N186 End stage renal disease: Secondary | ICD-10-CM | POA: Diagnosis not present

## 2018-09-10 DIAGNOSIS — D509 Iron deficiency anemia, unspecified: Secondary | ICD-10-CM | POA: Diagnosis not present

## 2018-09-10 DIAGNOSIS — N2581 Secondary hyperparathyroidism of renal origin: Secondary | ICD-10-CM | POA: Diagnosis not present

## 2018-09-12 DIAGNOSIS — D509 Iron deficiency anemia, unspecified: Secondary | ICD-10-CM | POA: Diagnosis not present

## 2018-09-12 DIAGNOSIS — N2581 Secondary hyperparathyroidism of renal origin: Secondary | ICD-10-CM | POA: Diagnosis not present

## 2018-09-12 DIAGNOSIS — N186 End stage renal disease: Secondary | ICD-10-CM | POA: Diagnosis not present

## 2018-09-14 DIAGNOSIS — N186 End stage renal disease: Secondary | ICD-10-CM | POA: Diagnosis not present

## 2018-09-14 DIAGNOSIS — D509 Iron deficiency anemia, unspecified: Secondary | ICD-10-CM | POA: Diagnosis not present

## 2018-09-14 DIAGNOSIS — N2581 Secondary hyperparathyroidism of renal origin: Secondary | ICD-10-CM | POA: Diagnosis not present

## 2018-09-17 DIAGNOSIS — N186 End stage renal disease: Secondary | ICD-10-CM | POA: Diagnosis not present

## 2018-09-17 DIAGNOSIS — D509 Iron deficiency anemia, unspecified: Secondary | ICD-10-CM | POA: Diagnosis not present

## 2018-09-17 DIAGNOSIS — N2581 Secondary hyperparathyroidism of renal origin: Secondary | ICD-10-CM | POA: Diagnosis not present

## 2018-09-19 DIAGNOSIS — N2581 Secondary hyperparathyroidism of renal origin: Secondary | ICD-10-CM | POA: Diagnosis not present

## 2018-09-19 DIAGNOSIS — D509 Iron deficiency anemia, unspecified: Secondary | ICD-10-CM | POA: Diagnosis not present

## 2018-09-19 DIAGNOSIS — N186 End stage renal disease: Secondary | ICD-10-CM | POA: Diagnosis not present

## 2018-09-21 DIAGNOSIS — N186 End stage renal disease: Secondary | ICD-10-CM | POA: Diagnosis not present

## 2018-09-21 DIAGNOSIS — D509 Iron deficiency anemia, unspecified: Secondary | ICD-10-CM | POA: Diagnosis not present

## 2018-09-21 DIAGNOSIS — N2581 Secondary hyperparathyroidism of renal origin: Secondary | ICD-10-CM | POA: Diagnosis not present

## 2018-09-23 DIAGNOSIS — N186 End stage renal disease: Secondary | ICD-10-CM | POA: Diagnosis not present

## 2018-09-23 DIAGNOSIS — Z992 Dependence on renal dialysis: Secondary | ICD-10-CM | POA: Diagnosis not present

## 2018-09-23 DIAGNOSIS — I120 Hypertensive chronic kidney disease with stage 5 chronic kidney disease or end stage renal disease: Secondary | ICD-10-CM | POA: Diagnosis not present

## 2018-09-24 DIAGNOSIS — N186 End stage renal disease: Secondary | ICD-10-CM | POA: Diagnosis not present

## 2018-09-24 DIAGNOSIS — N2581 Secondary hyperparathyroidism of renal origin: Secondary | ICD-10-CM | POA: Diagnosis not present

## 2018-09-26 DIAGNOSIS — N186 End stage renal disease: Secondary | ICD-10-CM | POA: Diagnosis not present

## 2018-09-26 DIAGNOSIS — N2581 Secondary hyperparathyroidism of renal origin: Secondary | ICD-10-CM | POA: Diagnosis not present

## 2018-09-28 DIAGNOSIS — N186 End stage renal disease: Secondary | ICD-10-CM | POA: Diagnosis not present

## 2018-09-28 DIAGNOSIS — N2581 Secondary hyperparathyroidism of renal origin: Secondary | ICD-10-CM | POA: Diagnosis not present

## 2018-10-01 DIAGNOSIS — N2581 Secondary hyperparathyroidism of renal origin: Secondary | ICD-10-CM | POA: Diagnosis not present

## 2018-10-01 DIAGNOSIS — N186 End stage renal disease: Secondary | ICD-10-CM | POA: Diagnosis not present

## 2018-10-02 DIAGNOSIS — N186 End stage renal disease: Secondary | ICD-10-CM | POA: Diagnosis not present

## 2018-10-02 DIAGNOSIS — Z992 Dependence on renal dialysis: Secondary | ICD-10-CM | POA: Diagnosis not present

## 2018-10-02 DIAGNOSIS — I871 Compression of vein: Secondary | ICD-10-CM | POA: Diagnosis not present

## 2018-10-02 DIAGNOSIS — T82858A Stenosis of vascular prosthetic devices, implants and grafts, initial encounter: Secondary | ICD-10-CM | POA: Diagnosis not present

## 2018-10-03 DIAGNOSIS — N186 End stage renal disease: Secondary | ICD-10-CM | POA: Diagnosis not present

## 2018-10-03 DIAGNOSIS — N2581 Secondary hyperparathyroidism of renal origin: Secondary | ICD-10-CM | POA: Diagnosis not present

## 2018-10-05 DIAGNOSIS — N2581 Secondary hyperparathyroidism of renal origin: Secondary | ICD-10-CM | POA: Diagnosis not present

## 2018-10-05 DIAGNOSIS — N186 End stage renal disease: Secondary | ICD-10-CM | POA: Diagnosis not present

## 2018-10-08 ENCOUNTER — Emergency Department (HOSPITAL_COMMUNITY): Payer: Medicare Other

## 2018-10-08 ENCOUNTER — Encounter (HOSPITAL_COMMUNITY): Payer: Self-pay | Admitting: Emergency Medicine

## 2018-10-08 ENCOUNTER — Emergency Department (HOSPITAL_COMMUNITY)
Admission: EM | Admit: 2018-10-08 | Discharge: 2018-10-08 | Disposition: A | Discharge status: Home / Self Care | Payer: Medicare Other | Attending: Emergency Medicine | Admitting: Emergency Medicine

## 2018-10-08 DIAGNOSIS — N2581 Secondary hyperparathyroidism of renal origin: Secondary | ICD-10-CM | POA: Diagnosis not present

## 2018-10-08 DIAGNOSIS — N186 End stage renal disease: Secondary | ICD-10-CM | POA: Insufficient documentation

## 2018-10-08 DIAGNOSIS — R195 Other fecal abnormalities: Secondary | ICD-10-CM | POA: Diagnosis not present

## 2018-10-08 DIAGNOSIS — R1032 Left lower quadrant pain: Secondary | ICD-10-CM | POA: Diagnosis present

## 2018-10-08 DIAGNOSIS — A09 Infectious gastroenteritis and colitis, unspecified: Secondary | ICD-10-CM | POA: Diagnosis not present

## 2018-10-08 DIAGNOSIS — I120 Hypertensive chronic kidney disease with stage 5 chronic kidney disease or end stage renal disease: Secondary | ICD-10-CM | POA: Insufficient documentation

## 2018-10-08 DIAGNOSIS — F17290 Nicotine dependence, other tobacco product, uncomplicated: Secondary | ICD-10-CM | POA: Diagnosis not present

## 2018-10-08 DIAGNOSIS — Z79899 Other long term (current) drug therapy: Secondary | ICD-10-CM | POA: Insufficient documentation

## 2018-10-08 DIAGNOSIS — N281 Cyst of kidney, acquired: Secondary | ICD-10-CM | POA: Diagnosis not present

## 2018-10-08 DIAGNOSIS — R000 Tachycardia, unspecified: Secondary | ICD-10-CM | POA: Diagnosis not present

## 2018-10-08 LAB — COMPREHENSIVE METABOLIC PANEL
ALBUMIN: 3.6 g/dL (ref 3.5–5.0)
ALT: 13 U/L (ref 0–44)
ANION GAP: 16 — AB (ref 5–15)
AST: 14 U/L — ABNORMAL LOW (ref 15–41)
Alkaline Phosphatase: 40 U/L (ref 38–126)
BUN: 20 mg/dL (ref 6–20)
CHLORIDE: 96 mmol/L — AB (ref 98–111)
CO2: 22 mmol/L (ref 22–32)
Calcium: 10.5 mg/dL — ABNORMAL HIGH (ref 8.9–10.3)
Creatinine, Ser: 12.8 mg/dL — ABNORMAL HIGH (ref 0.61–1.24)
GFR calc non Af Amer: 4 mL/min — ABNORMAL LOW (ref >60)
GFR, EST AFRICAN AMERICAN: 5 mL/min — AB (ref >60)
GLUCOSE: 104 mg/dL — AB (ref 70–99)
POTASSIUM: 3.9 mmol/L (ref 3.5–5.1)
SODIUM: 134 mmol/L — AB (ref 135–145)
Total Bilirubin: 1.8 mg/dL — ABNORMAL HIGH (ref 0.3–1.2)
Total Protein: 9 g/dL — ABNORMAL HIGH (ref 6.5–8.1)

## 2018-10-08 LAB — CBC WITH DIFFERENTIAL/PLATELET
ABS IMMATURE GRANULOCYTES: 0.04 10*3/uL (ref 0.00–0.07)
BASOS ABS: 0 10*3/uL (ref 0.0–0.1)
Basophils Relative: 0 %
EOS PCT: 1 %
Eosinophils Absolute: 0.1 10*3/uL (ref 0.0–0.5)
HCT: 50.2 % (ref 39.0–52.0)
HEMOGLOBIN: 16.3 g/dL (ref 13.0–17.0)
IMMATURE GRANULOCYTES: 0 %
LYMPHS PCT: 15 %
Lymphs Abs: 1.8 10*3/uL (ref 0.7–4.0)
MCH: 27.9 pg (ref 26.0–34.0)
MCHC: 32.5 g/dL (ref 30.0–36.0)
MCV: 85.8 fL (ref 80.0–100.0)
Monocytes Absolute: 1.1 10*3/uL — ABNORMAL HIGH (ref 0.1–1.0)
Monocytes Relative: 9 %
NEUTROS ABS: 8.6 10*3/uL — AB (ref 1.7–7.7)
NEUTROS PCT: 75 %
NRBC: 0.2 % (ref 0.0–0.2)
Platelets: 238 10*3/uL (ref 150–400)
RBC: 5.85 MIL/uL — ABNORMAL HIGH (ref 4.22–5.81)
RDW: 17.5 % — ABNORMAL HIGH (ref 11.5–15.5)
WBC: 11.7 10*3/uL — ABNORMAL HIGH (ref 4.0–10.5)

## 2018-10-08 LAB — LIPASE, BLOOD: Lipase: 44 U/L (ref 11–51)

## 2018-10-08 LAB — I-STAT CG4 LACTIC ACID, ED
Lactic Acid, Venous: 1.92 mmol/L — ABNORMAL HIGH (ref 0.5–1.9)
Lactic Acid, Venous: 1.79 mmol/L (ref 0.5–1.9)

## 2018-10-08 LAB — PROTIME-INR
INR: 1.09
PROTHROMBIN TIME: 14 s (ref 11.4–15.2)

## 2018-10-08 LAB — I-STAT TROPONIN, ED: Troponin i, poc: 0.01 ng/mL (ref 0.00–0.08)

## 2018-10-08 MED ORDER — FAMOTIDINE IN NACL 20-0.9 MG/50ML-% IV SOLN
20.0000 mg | Freq: Once | INTRAVENOUS | Status: AC
  Administered 2018-10-08: 20 mg via INTRAVENOUS
  Filled 2018-10-08: qty 50

## 2018-10-08 MED ORDER — SODIUM CHLORIDE 0.9 % IV BOLUS
500.0000 mL | Freq: Once | INTRAVENOUS | Status: AC
  Administered 2018-10-08: 500 mL via INTRAVENOUS

## 2018-10-08 MED ORDER — IOHEXOL 300 MG/ML  SOLN
100.0000 mL | Freq: Once | INTRAMUSCULAR | Status: AC | PRN
  Administered 2018-10-08: 100 mL via INTRAVENOUS

## 2018-10-08 MED ORDER — OMEPRAZOLE 40 MG PO CPDR: 40.0000 mg | DELAYED_RELEASE_CAPSULE | Freq: Every day | ORAL | 0 refills | Status: DC

## 2018-10-08 NOTE — ED Provider Notes (Signed)
[facility] EMERGENCY DEPARTMENT Provider Note   CSN: [id number] Arrival date & time: 10/08/18  1613     History   Chief Complaint Chief Complaint  Patient presents with  . Emesis  . Diarrhea    HPI [name] is a 47 y.o. male with history of GERD, ESRD on dialysis Monday, Wednesday, Friday at [facility] kidney center, is here for evaluation of abdominal pain.  Onset 1 week ago.  It is worse at the left lower quadrant and intermittently radiates to the left upper and right upper quadrants.  Associated with diarrhea described as watery, yellow for 1 week, intermittent.  He reports subjective fevers/sweats, shortness of breath and lightheadedness with active vomiting but none otherwise or with exertion.  Other associated symptoms include a dry cough and feeling like his heartburn is flaring up.  He described heartburn and "burning" in his epigastrium initially worse with eating.  He stopped taking Pepcid a month and a half ago because he saw the add about Pepcid causing cancer.  He has been eating applesauce and ginger ale since Friday to help with heartburn but it persists despite diet changes.  He went to dialysis today and had to end the session short by 55 minutes because the system clotted.  He told Dr. Justin Mend about his symptoms and he advised patient to come to the ER.  No interventions for this.  No alleviating or aggravating factors.  He does not produce any urine.  No previous abdominal surgeries.  He denies hematemesis, melena, CP.  He was told he had pancreatitis once 10 years ago.  Does not take any NSAIDs.  Occasional, rare EtOH use.  No sick contacts.  No exposure to new suspicious foods.  No recent biotics. HPI  Past Medical History:  Diagnosis Date  . Anemia    Iron deficiency  . Aneurysm ([place]) 02/15/2007   Right arm large aneurysm  . Constipation   . End stage renal disease on dialysis ([place])    M- W-F  . GERD (gastroesophageal reflux disease)   .  Hemodialysis finding May 2 ,2005   First HD at [facility].  Marland Kitchen History of bronchitis   . Hypertension   . Sickle cell trait [facility])     Patient Active Problem List   Diagnosis Date Noted  . Secondary hyperparathyroidism ([place]) 02/12/2016  . Hyperparathyroidism, secondary ([place]) 02/10/2016  . Closed fracture of left patella 08/24/2015  . Quadriceps tendon rupture 08/24/2015  . Non-healing surgical wound 07/10/2014  . Fever in adult 09/10/2013  . SIRS (systemic inflammatory response syndrome) ([place]) 09/10/2013  . End stage renal disease ([place]) 05/03/2012  . Other complications due to renal dialysis device, implant, and graft 05/03/2012    Past Surgical History:  Procedure Laterality Date  . AV FISTULA PLACEMENT    . AV FISTULA PLACEMENT Left 05/03/2013   Procedure: ARTERIOVENOUS (AV) FISTULA CREATION;  Surgeon: Rosetta Posner, MD;  Location: [place];  Service: Vascular;  Laterality: Left;  . BASCILIC VEIN TRANSPOSITION Left 11/28/2016   Procedure: BASCILIC VEIN TRANSPOSITION- LEFT ARM;  Surgeon: Rosetta Posner, MD;  Location: [place];  Service: Vascular;  Laterality: Left;  . COLONOSCOPY     Hxl: of  . colonscopy    . FISTULA SUPERFICIALIZATION Left 07/24/2013   Procedure: FISTULA SUPERFICIALIZATION AND LIGATION OF BRANCHES;  Surgeon: Rosetta Posner, MD;  Location: [place];  Service: Vascular;  Laterality: Left;  . FRACTURE SURGERY Left Jul 28, 2015  Petela Tendon and Fx  . INSERTION OF DIALYSIS CATHETER Right 04/22/2013   Procedure: INSERTION OF DIALYSIS CATHETER;  Surgeon: Rosetta Posner, MD;  Location: [place];  Service: Vascular;  Laterality: Right;  . LIGATION OF ARTERIOVENOUS  FISTULA Right 04/22/2013   Procedure: LIGATION OF ARTERIOVENOUS  FISTULA  AND RESECTION OF VENOUS ANEURYSM AND ABSCESS;  Surgeon: Rosetta Posner, MD;  Location: [place];  Service: Vascular;  Laterality: Right;  . PARATHYROIDECTOMY N/A 02/12/2016   Procedure:  TOTAL PARATHYROIDECTOMY AUTOTRANSPLANT TO RIGHT FOREARM ;  Surgeon:  Armandina Gemma, MD;  Location: [place];  Service: General;  Laterality: N/A;  . QUADRICEPS TENDON REPAIR Left 08/24/2015   Procedure: REPAIR OF LEFT QUADRICEP TENDON;  Surgeon: Frederik Pear, MD;  Location: [place];  Service: Orthopedics;  Laterality: Left;  . REMOVAL OF A DIALYSIS CATHETER  2014  . TEE WITHOUT CARDIOVERSION N/A 09/13/2013   Procedure: TRANSESOPHAGEAL ECHOCARDIOGRAM (TEE);  Surgeon: Sanda Klein, MD;  Location: [facility] ENDOSCOPY;  Service: Cardiovascular;  Laterality: N/A;        Home Medications    Prior to Admission medications   Medication Sig Start Date End Date Taking? Authorizing Provider  acetaminophen (TYLENOL) 500 MG tablet Take 1,000 mg by mouth every 8 (eight) hours as needed (pain or headaches).    Yes [provider]  b complex-vitamin c-folic acid (NEPHRO-VITE) 0.8 MG TABS Take 1 tablet by mouth daily.    Yes [provider]  calcium elemental as carbonate (BARIATRIC TUMS ULTRA) 400 MG chewable tablet Chew 1,000 mg by mouth 3 (three) times daily after meals.    Yes [provider]  midodrine (PROAMATINE) 10 MG tablet Take 10 mg by mouth See admin instructions. Take 1 tablet (10 mg) by mouth prior to dialysis and 1 tablet (10 mg) half way through dialysis - Monday, Wednesday, Friday   Yes [provider]  Multiple Vitamins-Minerals (RENAPLEX-D) TABS Take 1 tablet by mouth daily. 07/18/18  Yes [provider]  sevelamer (RENAGEL) 800 MG tablet Take 1,600-2,400 mg by mouth 3 (three) times daily with meals.   Yes [provider]  omeprazole (PRILOSEC) 40 MG capsule Take 1 capsule (40 mg total) by mouth daily. 10/08/18   Kinnie Feil, PA-C  oxyCODONE-acetaminophen (PERCOCET/ROXICET) 5-325 MG tablet Take 1 tablet by mouth every 6 (six) hours as needed. Patient not taking: Reported on 08/03/2018 11/28/16   Alvia Grove PA-C    Family History Family History  Problem Relation Age of Onset  . Hypertension Mother   .  Heart disease Mother        before age 65  . Other Brother        DVT    Social History Social History   Tobacco Use  . Smoking status: Light Tobacco Smoker    Types: Cigars  . Smokeless tobacco: Never Used  . Tobacco comment: pt states he smokes about 2 cigars per year  Substance Use Topics  . Alcohol use: Yes    Comment: rarely  . Drug use: No     Allergies   Tape and Chlorhexidine   Review of Systems Review of Systems  Constitutional: Positive for chills and fever.  Respiratory: Positive for shortness of breath (with vomiting).   Gastrointestinal: Positive for abdominal pain, diarrhea and nausea.  Neurological: Positive for light-headedness.  All other systems reviewed and are negative.    Physical Exam Updated Vital Signs BP (!) 147/110 (BP Location: Right Leg) Comment: after ambulating; RN notified  Pulse (!) 114   Temp 100 F (37.8 C) (Rectal)   Resp (!) 24   Wt (!) 148.2 kg   SpO2 100%   BMI 44.93 kg/m   Physical Exam Vitals signs and nursing note reviewed.  Constitutional:      Appearance: He is well-developed.     Comments: Non toxic.  HENT:     Head: Normocephalic and atraumatic.     Nose: Nose normal.  Eyes:     Conjunctiva/sclera: Conjunctivae normal.     Pupils: Pupils are equal, round, and reactive to light.  Neck:     Musculoskeletal: Normal range of motion.  Cardiovascular:     Rate and Rhythm: Normal rate and regular rhythm.     Heart sounds: Normal heart sounds.     Comments: 1+ radial and DP pulses bilaterally.  Palpable thrill to the left upper extremity fistula.  No lower extremity edema. Pulmonary:     Effort: Pulmonary effort is normal.     Breath sounds: Normal breath sounds.  Abdominal:     General: Bowel sounds are normal.     Palpations: Abdomen is soft.     Tenderness: There is abdominal tenderness.     Comments: Epigastrium, left lower and left mid abdominal tenderness.  No guarding, rigidity.  Obese abdomen.  No  suprapubic or CVA tenderness.  Musculoskeletal: Normal range of motion.  Skin:    General: Skin is warm and dry.     Capillary Refill: Capillary refill takes less than 2 seconds.  Neurological:     Mental Status: He is alert and oriented to person, place, and time.  Psychiatric:        Behavior: Behavior normal.        Thought Content: Thought content normal.        Judgment: Judgment normal.      ED Treatments / Results  Labs (all labs ordered are listed, but only abnormal results are displayed) Labs Reviewed  COMPREHENSIVE METABOLIC PANEL - Abnormal; Notable for the following components:      Result Value   Sodium 134 (*)    Chloride 96 (*)    Glucose, Bld 104 (*)    Creatinine, Ser 12.80 (*)    Calcium 10.5 (*)    Total Protein 9.0 (*)    AST 14 (*)    Total Bilirubin 1.8 (*)    GFR calc non Af Amer 4 (*)    GFR calc Af Amer 5 (*)    Anion gap 16 (*)    All other components within normal limits  CBC WITH DIFFERENTIAL/PLATELET - Abnormal; Notable for the following components:   WBC 11.7 (*)    RBC 5.85 (*)    RDW 17.5 (*)    Neutro Abs 8.6 (*)    Monocytes Absolute 1.1 (*)    All other components within normal limits  I-STAT CG4 LACTIC ACID, ED - Abnormal; Notable for the following components:   Lactic Acid, Venous 1.92 (*)    All other components within normal limits  CULTURE, BLOOD (ROUTINE X 2)  CULTURE, BLOOD (ROUTINE X 2)  PROTIME-INR  LIPASE, BLOOD  I-STAT CG4 LACTIC ACID, ED  I-STAT TROPONIN, ED    EKG EKG Interpretation  Date/Time:  Monday October 08 2018 18:28:22 EST Ventricular Rate:  106 PR Interval:    QRS Duration: 96 QT Interval:  345 QTC Calculation: 459 R Axis:   -22 Text Interpretation:  Sinus tachycardia Borderline left axis deviation Abnormal R-wave progression, late  transition No significant change since last tracing Confirmed by Blanchie Dessert [phone]) on 10/08/2018 6:32:50 PM   Radiology Ct Abdomen Pelvis W Contrast  Result  Date: 10/08/2018 CLINICAL DATA:  Left lower quadrant pain and tenderness. Diarrhea. Patient with end-stage renal disease, dialysis earlier today. EXAM: CT ABDOMEN AND PELVIS WITH CONTRAST TECHNIQUE: Multidetector CT imaging of the abdomen and pelvis was performed using the standard protocol following bolus administration of intravenous contrast. CONTRAST:  18mL OMNIPAQUE IOHEXOL 300 MG/ML  SOLN COMPARISON:  CT 08/05/2016 FINDINGS: Lower chest: No consolidation or pleural effusion. Hepatobiliary: No focal hepatic abnormality. Gallbladder physiologically distended, no calcified stone. No biliary dilatation. Pancreas: No ductal dilatation or inflammation. Spleen: Prominent size spanning 17.7 cm AP. No focal abnormality. Adrenals/Urinary Tract: Normal adrenal glands. Near complete cystic replacement of both kidneys, with increased number and size of renal cysts from prior exam. Few of the cysts have peripheral wall calcifications. No excretion on delayed phase imaging consistent with history of end-stage renal disease. No hydronephrosis. The urinary bladder is empty. Stomach/Bowel: Stomach is unremarkable. No small bowel wall thickening, inflammatory change, or obstruction. Normal appendix. Proximal colon is decompressed, submucosal fatty infiltration as before. Liquid stool in the sigmoid colon and rectum. No colonic wall thickening or pericolonic edema. No significant diverticular disease. Vascular/Lymphatic: Mild aorta bi-iliac atherosclerosis without aneurysm. Small retroperitoneal and porta hepatis nodes, not enlarged by size criteria. No pathologic adenopathy. Reproductive: Prostate gland is unremarkable. Vas deferens calcifications, chronic. Other: Tiny fat containing umbilical hernia. No free air, free fluid or ascites. Musculoskeletal: Diffuse osseous sclerosis consistent with renal osteodystrophy. No acute osseous abnormalities. IMPRESSION: 1. Liquid stool in the sigmoid colon and rectum, consistent with  diarrheal process. No colonic wall thickening or pericolonic inflammation. 2. Cystic replacement of both kidneys consistent with history of end-stage renal disease, cysts have increased in size and number from 2017 exam. No suspicious solid lesion. 3.  Aortic Atherosclerosis (ICD10-I70.0). Electronically Signed   By: Keith Rake M.D.   On: 10/08/2018 21:14    Procedures Procedures (including critical care time)  Medications Ordered in ED Medications  sodium chloride 0.9 % bolus 500 mL (0 mLs Intravenous Stopped 10/08/18 2036)  sodium chloride 0.9 % bolus 500 mL (0 mLs Intravenous Stopped 10/08/18 1949)  famotidine (PEPCID) IVPB 20 mg premix (0 mg Intravenous Stopped 10/08/18 2224)  iohexol (OMNIPAQUE) 300 MG/ML solution 100 mL (100 mLs Intravenous Contrast Given 10/08/18 2044)     Initial Impression / Assessment and Plan / ED Course  I have reviewed the triage vital signs and the nursing notes.  Pertinent labs & imaging results that were available during my care of the patient were reviewed by me and considered in my medical decision making (see chart for details).  Clinical Course as of Oct 08 2320  Mon Oct 08, 2018  2050 Re-evaluated pt.  Repeat abd exam he continues to have epigastric and LLQ tenderness. He is asking for something for heart burn. SBP in 90s.  He denies CP, SOB, light-headedness with exertion.    [CG]  2126 IMPRESSION: 1. Liquid stool in the sigmoid colon and rectum, consistent with diarrheal process. No colonic wall thickening or pericolonic inflammation. 2. Cystic replacement of both kidneys consistent with history of end-stage renal disease, cysts have increased in size and number from 2017 exam. No suspicious solid lesion. 3. Aortic Atherosclerosis (ICD10-I70.0).  CT ABDOMEN PELVIS W CONTRAST [CG]    Clinical Course User Index [CG] Kinnie Feil, PA-C    47 year old is here  abdominal pain, diarrhea, nausea, vomiting.  Recently stopped taking  pepcid.  He has epigastric and LLQ tenderness.  Ddx includes poorly controlled GERD vs pancreatitis vs viral gastro.  Given LLQ tenderness and diarrhea, also consider diverticulitis vs SBO.  He arrives mildly tachycardic and hypotensive but just dialyzed today and states typically his blood pressure is in the 100-105 range after dialysis.  He has no exertional CP, SOB, light-headedness, melena. His tachycardia and hypotension may be secondary to dehydration or recent dialysis. He has distal pulses bilaterally, no peritonitis, and dissection considered less likely. He is well appearing.  We will obtain screening labs and CT A/P.  We will provide gentle fluids and reassess blood pressure. Given epigastrium discomfort, acid reflux flare, cardiac risk factors, soft Bps we will extend work up to include EKG, trop to evaluate for cardiac ischemia.   2310: BPs have been taken in thighs.  Most recent is SBP 111.  He ambulated without any lightheadedness, presyncope, chest pain, shortness of breath.  Work-up today is reassuring.  WBC 11.7.  Creatinine and electrolytes at his baseline.  CT A/P with liquid stool in the colon but no other acute intra-abdominal/pelvic process.  We will discharge with symptomatic and supportive management of likely viral diarrheal process.  Recommended omeprazole for acid reflux type symptoms and GI follow-up.  Return precautions were given.  Patient is in agreement with this. Final Clinical Impressions(s) / ED Diagnoses   Final diagnoses:  Diarrhea of infectious origin    ED Discharge Orders         Ordered    omeprazole (PRILOSEC) 40 MG capsule  Daily     10/08/18 2318           Kinnie Feil, PA-C 10/08/18 2321    Blanchie Dessert, MD 10/09/18 0005

## 2018-10-08 NOTE — ED Notes (Signed)
IV team at bedside 

## 2018-10-08 NOTE — ED Notes (Signed)
Patient transported to CT 

## 2018-10-08 NOTE — Discharge Instructions (Addendum)
You were seen in the ER for abdominal pain.  CT did not show any concerning findings on your abdomen/pelvis.  Stay well-hydrated.  You may take Imodium as needed for diarrhea.  Avoid anything that will make her diarrhea worse such as dairy products or foods.  Return to the ER for fever, worsening pain, blood in your stools.  Take omeprazole 40 mg on an empty stomach for acid reflux control.  Follow-up with gastroenterology if symptoms are not improved.

## 2018-10-08 NOTE — ED Triage Notes (Signed)
PT is a dialysis pt, completed dialysis today. He is here for LLQ abdominal pain, diarrhea and emesis X 3-4 days.

## 2018-10-10 DIAGNOSIS — N186 End stage renal disease: Secondary | ICD-10-CM | POA: Diagnosis not present

## 2018-10-10 DIAGNOSIS — N2581 Secondary hyperparathyroidism of renal origin: Secondary | ICD-10-CM | POA: Diagnosis not present

## 2018-10-12 DIAGNOSIS — N2581 Secondary hyperparathyroidism of renal origin: Secondary | ICD-10-CM | POA: Diagnosis not present

## 2018-10-12 DIAGNOSIS — N186 End stage renal disease: Secondary | ICD-10-CM | POA: Diagnosis not present

## 2018-10-13 LAB — CULTURE, BLOOD (ROUTINE X 2)
Culture: NO GROWTH
Culture: NO GROWTH
Special Requests: ADEQUATE

## 2018-10-14 DIAGNOSIS — N186 End stage renal disease: Secondary | ICD-10-CM | POA: Diagnosis not present

## 2018-10-14 DIAGNOSIS — N2581 Secondary hyperparathyroidism of renal origin: Secondary | ICD-10-CM | POA: Diagnosis not present

## 2018-10-16 DIAGNOSIS — N186 End stage renal disease: Secondary | ICD-10-CM | POA: Diagnosis not present

## 2018-10-16 DIAGNOSIS — N2581 Secondary hyperparathyroidism of renal origin: Secondary | ICD-10-CM | POA: Diagnosis not present

## 2018-10-19 DIAGNOSIS — N186 End stage renal disease: Secondary | ICD-10-CM | POA: Diagnosis not present

## 2018-10-19 DIAGNOSIS — N2581 Secondary hyperparathyroidism of renal origin: Secondary | ICD-10-CM | POA: Diagnosis not present

## 2018-10-21 DIAGNOSIS — N186 End stage renal disease: Secondary | ICD-10-CM | POA: Diagnosis not present

## 2018-10-21 DIAGNOSIS — N2581 Secondary hyperparathyroidism of renal origin: Secondary | ICD-10-CM | POA: Diagnosis not present

## 2018-10-23 DIAGNOSIS — N186 End stage renal disease: Secondary | ICD-10-CM | POA: Diagnosis not present

## 2018-10-23 DIAGNOSIS — N2581 Secondary hyperparathyroidism of renal origin: Secondary | ICD-10-CM | POA: Diagnosis not present

## 2019-01-04 IMAGING — CT CT ABD-PELV W/ CM
2 of 5 series · 16 of 46 positions shown, 18 images · IV contrast (APPLIED)
Comparison: CT 08/05/2016

CLINICAL DATA: Left lower quadrant pain and tenderness. Diarrhea.

EXAM:
CT ABDOMEN AND PELVIS WITH CONTRAST
TECHNIQUE: Multidetector CT imaging of the abdomen and pelvis was performed
using the standard protocol following bolus administration of
intravenous contrast.
CONTRAST:  100mL OMNIPAQUE IOHEXOL 300 MG/ML  SOLN

[Series 3: abd/ pelvis 5.0 i30f 2 · axial · 0.98mm/px · z∈[+882,+1292]mm · 13 of 94 slices shown, 15 images]
[im 6/94  soft-tissue]
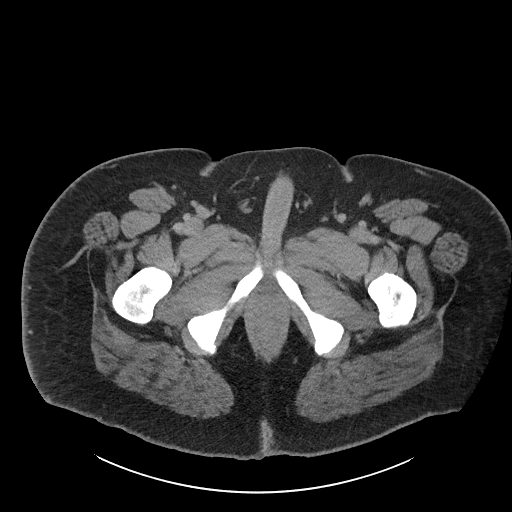
[im 6/94  bone]
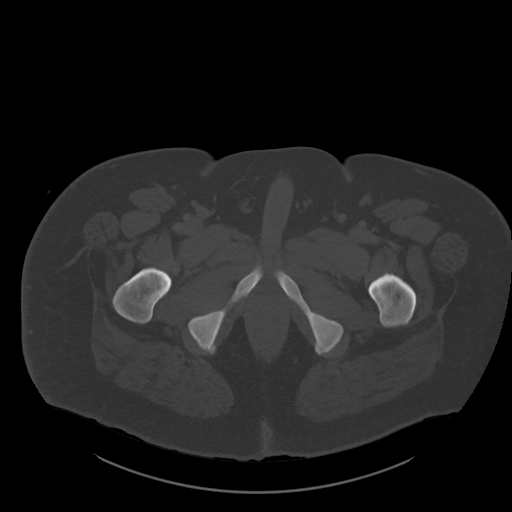
[im 11/94  soft-tissue]
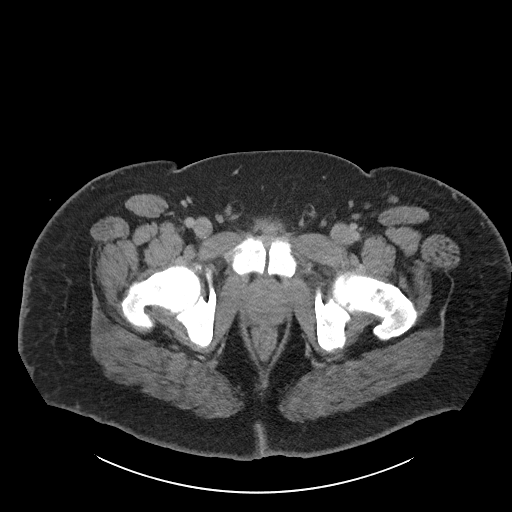
[im 21/94  soft-tissue]
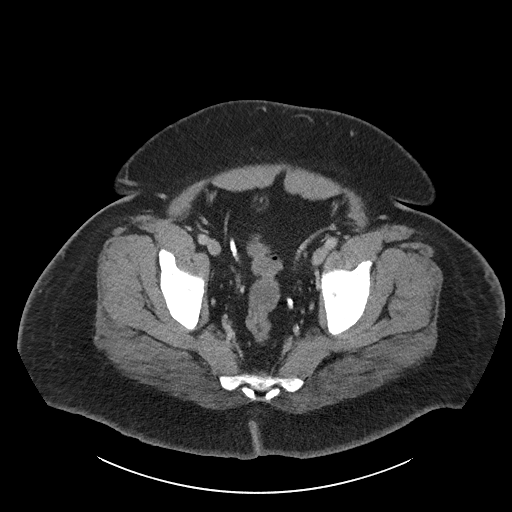
[im 26/94  soft-tissue]
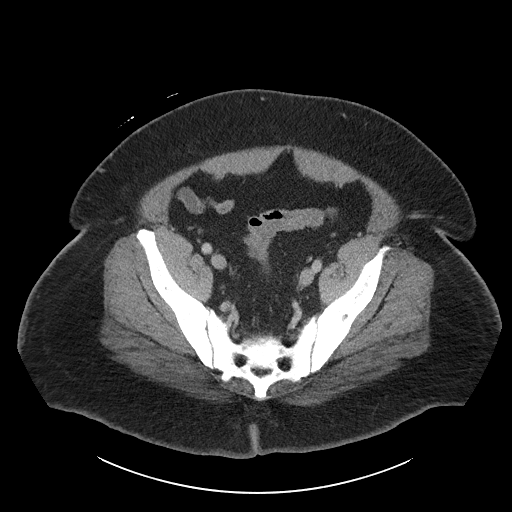
[im 32/94  soft-tissue]
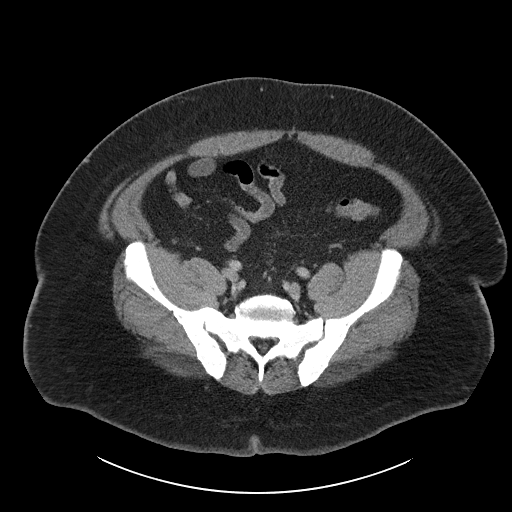
[im 42/94  soft-tissue]
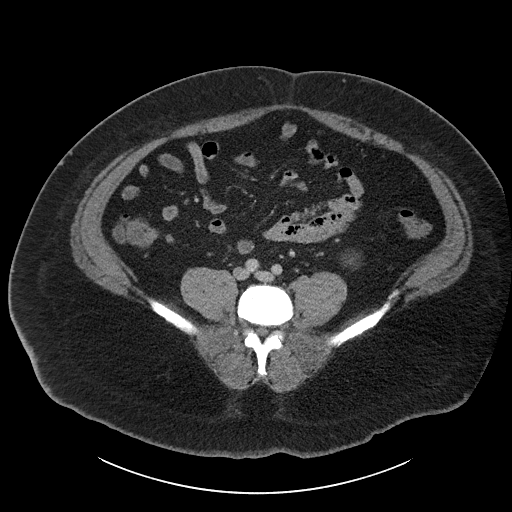
[im 47/94  soft-tissue]
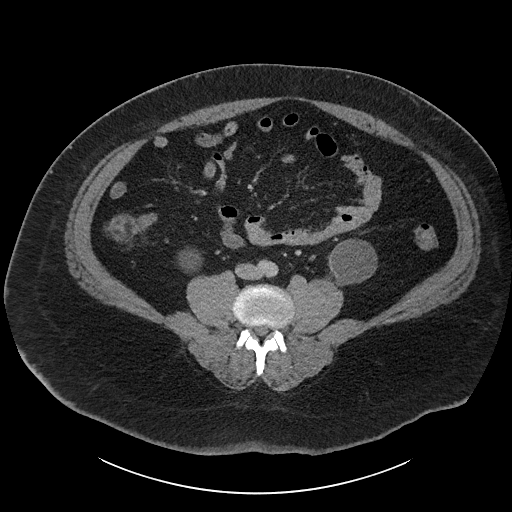
[im 52/94  soft-tissue]
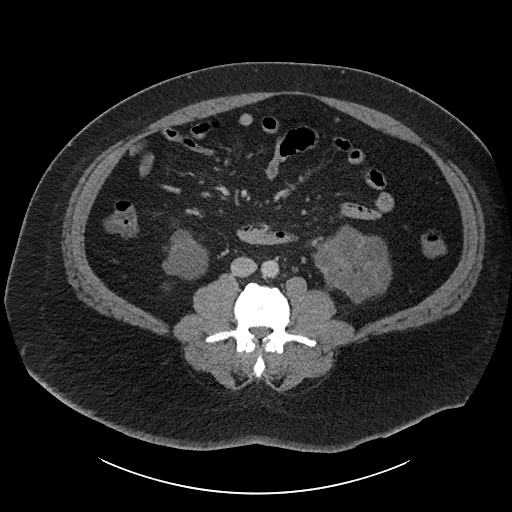
[im 63/94  soft-tissue]
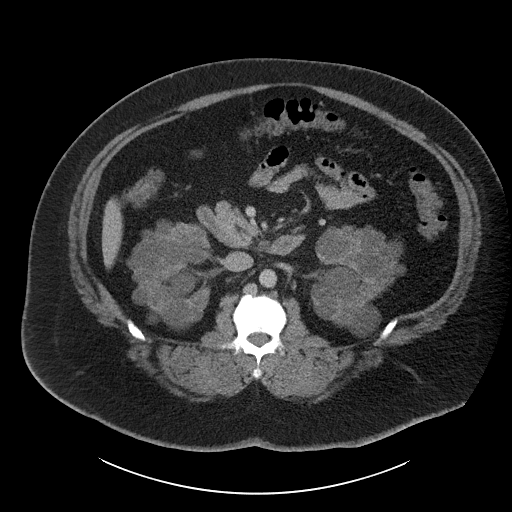
[im 63/94  bone]
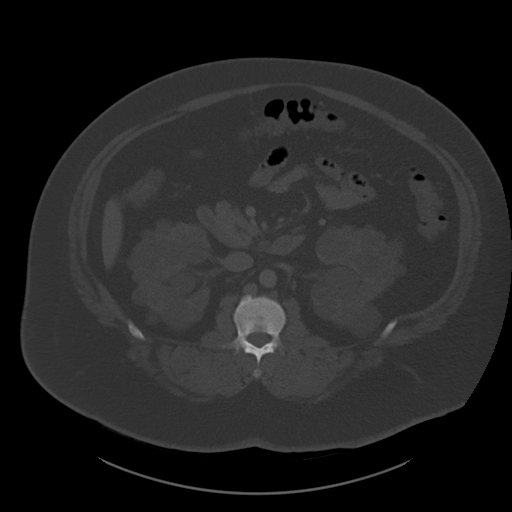
[im 68/94  soft-tissue]
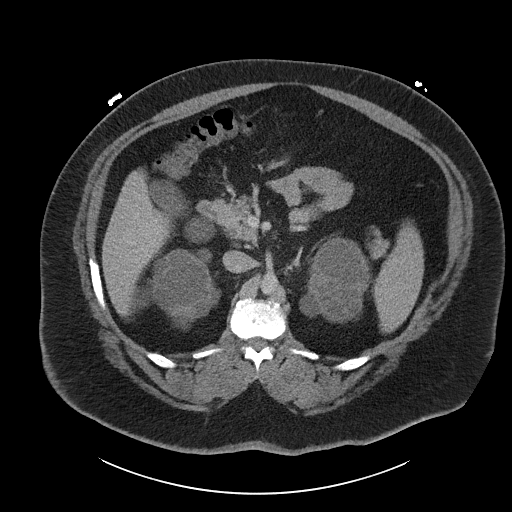
[im 73/94  soft-tissue]
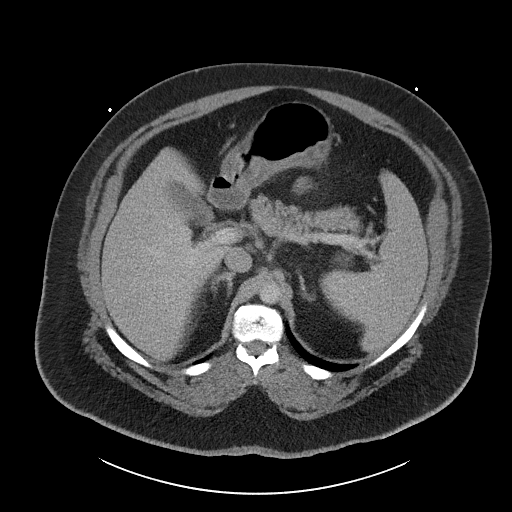
[im 83/94  soft-tissue]
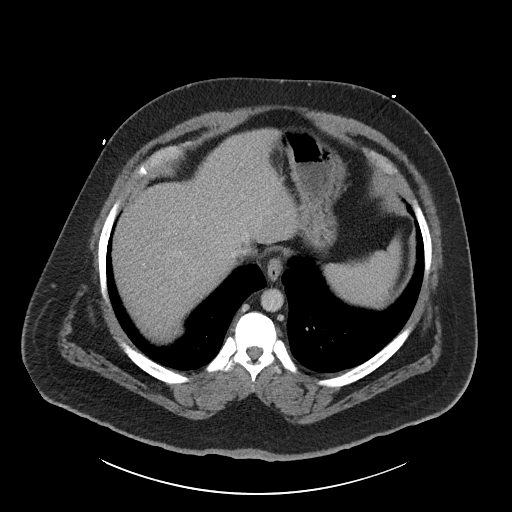
[im 88/94  soft-tissue]
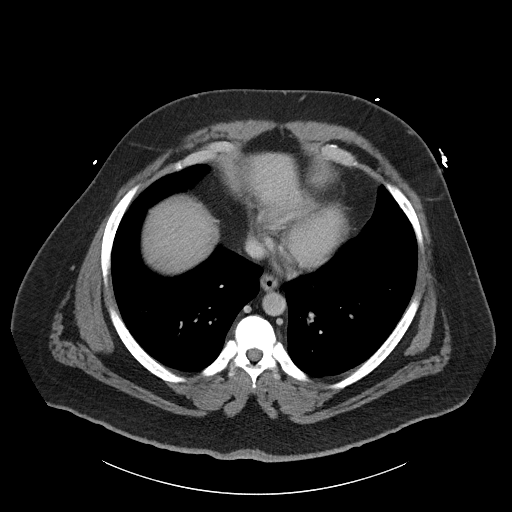

[Series 6: coronal soft tissue · coronal · 0.94mm/px · 3 of 119 slices shown]
[im 40/119  soft-tissue]
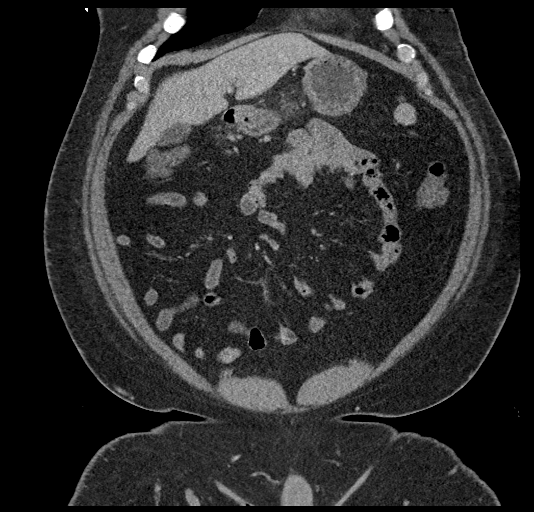
[im 53/119  soft-tissue]
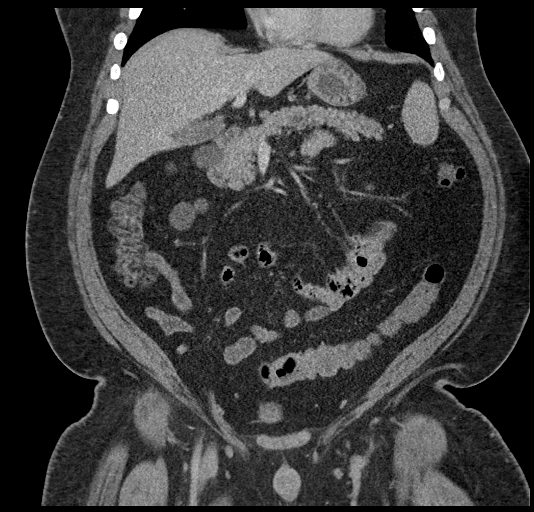
[im 66/119  soft-tissue]
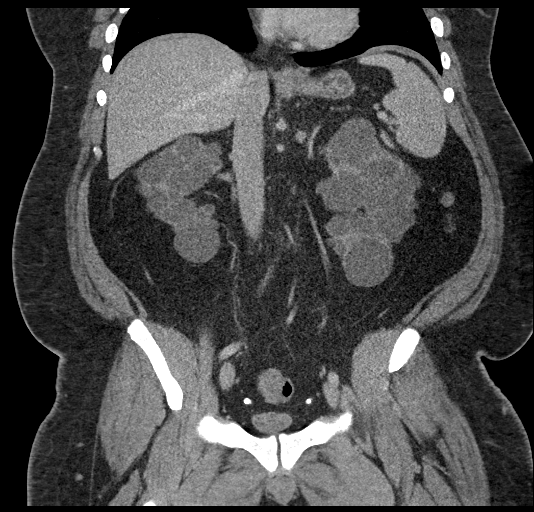

[16 of 46 positions shown; findings below may reference images not displayed]

FINDINGS: Lower chest: No consolidation or pleural effusion.

Hepatobiliary: No focal hepatic abnormality. Gallbladder
physiologically distended, no calcified stone. No biliary
dilatation.

Pancreas: No ductal dilatation or inflammation.

Spleen: Prominent size spanning 17.7 cm AP. No focal abnormality.

Adrenals/Urinary Tract: Normal adrenal glands. Near complete cystic
replacement of both kidneys, with increased number and size of renal
cysts from prior exam. Few of the cysts have peripheral wall
calcifications. No excretion on delayed phase imaging consistent
with history of end-stage renal disease. No hydronephrosis. The
urinary bladder is empty.

Stomach/Bowel: Stomach is unremarkable. No small bowel wall
thickening, inflammatory change, or obstruction. Normal appendix.
Proximal colon is decompressed, submucosal fatty infiltration as
before. Liquid stool in the sigmoid colon and rectum. No colonic
wall thickening or pericolonic edema. No significant diverticular
disease.

Vascular/Lymphatic: Mild aorta bi-iliac atherosclerosis without
aneurysm. Small retroperitoneal and porta hepatis nodes, not
enlarged by size criteria. No pathologic adenopathy.

Reproductive: Prostate gland is unremarkable. Vas deferens
calcifications, chronic.

Other: Tiny fat containing umbilical hernia. No free air, free fluid
or ascites.

Musculoskeletal: Diffuse osseous sclerosis consistent with renal
osteodystrophy. No acute osseous abnormalities.
IMPRESSION: 1. Liquid stool in the sigmoid colon and rectum, consistent with
diarrheal process. No colonic wall thickening or pericolonic
inflammation.
2. Cystic replacement of both kidneys consistent with history of
end-stage renal disease, cysts have increased in size and number
from 7784 exam. No suspicious solid lesion.
3.  Aortic Atherosclerosis ([id number]).

## 2020-10-24 DIAGNOSIS — N186 End stage renal disease: Secondary | ICD-10-CM | POA: Diagnosis not present

## 2020-10-24 DIAGNOSIS — Z992 Dependence on renal dialysis: Secondary | ICD-10-CM | POA: Diagnosis not present

## 2020-10-24 DIAGNOSIS — I120 Hypertensive chronic kidney disease with stage 5 chronic kidney disease or end stage renal disease: Secondary | ICD-10-CM | POA: Diagnosis not present

## 2020-10-24 HISTORY — PX: COLONOSCOPY: SHX174

## 2020-10-26 DIAGNOSIS — D631 Anemia in chronic kidney disease: Secondary | ICD-10-CM | POA: Diagnosis not present

## 2020-10-26 DIAGNOSIS — N186 End stage renal disease: Secondary | ICD-10-CM | POA: Diagnosis not present

## 2020-10-26 DIAGNOSIS — N2581 Secondary hyperparathyroidism of renal origin: Secondary | ICD-10-CM | POA: Diagnosis not present

## 2020-10-26 DIAGNOSIS — Z992 Dependence on renal dialysis: Secondary | ICD-10-CM | POA: Diagnosis not present

## 2020-10-28 DIAGNOSIS — D631 Anemia in chronic kidney disease: Secondary | ICD-10-CM | POA: Diagnosis not present

## 2020-10-28 DIAGNOSIS — N186 End stage renal disease: Secondary | ICD-10-CM | POA: Diagnosis not present

## 2020-10-28 DIAGNOSIS — Z992 Dependence on renal dialysis: Secondary | ICD-10-CM | POA: Diagnosis not present

## 2020-10-28 DIAGNOSIS — N2581 Secondary hyperparathyroidism of renal origin: Secondary | ICD-10-CM | POA: Diagnosis not present

## 2020-10-30 DIAGNOSIS — Z992 Dependence on renal dialysis: Secondary | ICD-10-CM | POA: Diagnosis not present

## 2020-10-30 DIAGNOSIS — N186 End stage renal disease: Secondary | ICD-10-CM | POA: Diagnosis not present

## 2020-10-30 DIAGNOSIS — N2581 Secondary hyperparathyroidism of renal origin: Secondary | ICD-10-CM | POA: Diagnosis not present

## 2020-10-30 DIAGNOSIS — D631 Anemia in chronic kidney disease: Secondary | ICD-10-CM | POA: Diagnosis not present

## 2020-11-02 DIAGNOSIS — N186 End stage renal disease: Secondary | ICD-10-CM | POA: Diagnosis not present

## 2020-11-02 DIAGNOSIS — N2581 Secondary hyperparathyroidism of renal origin: Secondary | ICD-10-CM | POA: Diagnosis not present

## 2020-11-02 DIAGNOSIS — D631 Anemia in chronic kidney disease: Secondary | ICD-10-CM | POA: Diagnosis not present

## 2020-11-02 DIAGNOSIS — Z992 Dependence on renal dialysis: Secondary | ICD-10-CM | POA: Diagnosis not present

## 2020-11-04 DIAGNOSIS — D631 Anemia in chronic kidney disease: Secondary | ICD-10-CM | POA: Diagnosis not present

## 2020-11-04 DIAGNOSIS — N2581 Secondary hyperparathyroidism of renal origin: Secondary | ICD-10-CM | POA: Diagnosis not present

## 2020-11-04 DIAGNOSIS — Z992 Dependence on renal dialysis: Secondary | ICD-10-CM | POA: Diagnosis not present

## 2020-11-04 DIAGNOSIS — N186 End stage renal disease: Secondary | ICD-10-CM | POA: Diagnosis not present

## 2020-11-06 DIAGNOSIS — Z992 Dependence on renal dialysis: Secondary | ICD-10-CM | POA: Diagnosis not present

## 2020-11-06 DIAGNOSIS — D631 Anemia in chronic kidney disease: Secondary | ICD-10-CM | POA: Diagnosis not present

## 2020-11-06 DIAGNOSIS — N2581 Secondary hyperparathyroidism of renal origin: Secondary | ICD-10-CM | POA: Diagnosis not present

## 2020-11-06 DIAGNOSIS — N186 End stage renal disease: Secondary | ICD-10-CM | POA: Diagnosis not present

## 2020-11-09 DIAGNOSIS — Z992 Dependence on renal dialysis: Secondary | ICD-10-CM | POA: Diagnosis not present

## 2020-11-09 DIAGNOSIS — N2581 Secondary hyperparathyroidism of renal origin: Secondary | ICD-10-CM | POA: Diagnosis not present

## 2020-11-09 DIAGNOSIS — D631 Anemia in chronic kidney disease: Secondary | ICD-10-CM | POA: Diagnosis not present

## 2020-11-09 DIAGNOSIS — N186 End stage renal disease: Secondary | ICD-10-CM | POA: Diagnosis not present

## 2020-11-11 DIAGNOSIS — N186 End stage renal disease: Secondary | ICD-10-CM | POA: Diagnosis not present

## 2020-11-11 DIAGNOSIS — N2581 Secondary hyperparathyroidism of renal origin: Secondary | ICD-10-CM | POA: Diagnosis not present

## 2020-11-11 DIAGNOSIS — D631 Anemia in chronic kidney disease: Secondary | ICD-10-CM | POA: Diagnosis not present

## 2020-11-11 DIAGNOSIS — Z992 Dependence on renal dialysis: Secondary | ICD-10-CM | POA: Diagnosis not present

## 2020-11-13 DIAGNOSIS — N2581 Secondary hyperparathyroidism of renal origin: Secondary | ICD-10-CM | POA: Diagnosis not present

## 2020-11-13 DIAGNOSIS — N186 End stage renal disease: Secondary | ICD-10-CM | POA: Diagnosis not present

## 2020-11-13 DIAGNOSIS — D631 Anemia in chronic kidney disease: Secondary | ICD-10-CM | POA: Diagnosis not present

## 2020-11-13 DIAGNOSIS — Z992 Dependence on renal dialysis: Secondary | ICD-10-CM | POA: Diagnosis not present

## 2020-11-16 DIAGNOSIS — N2581 Secondary hyperparathyroidism of renal origin: Secondary | ICD-10-CM | POA: Diagnosis not present

## 2020-11-16 DIAGNOSIS — D631 Anemia in chronic kidney disease: Secondary | ICD-10-CM | POA: Diagnosis not present

## 2020-11-16 DIAGNOSIS — N186 End stage renal disease: Secondary | ICD-10-CM | POA: Diagnosis not present

## 2020-11-16 DIAGNOSIS — Z992 Dependence on renal dialysis: Secondary | ICD-10-CM | POA: Diagnosis not present

## 2020-11-18 DIAGNOSIS — H35033 Hypertensive retinopathy, bilateral: Secondary | ICD-10-CM | POA: Diagnosis not present

## 2020-11-18 DIAGNOSIS — Z992 Dependence on renal dialysis: Secondary | ICD-10-CM | POA: Diagnosis not present

## 2020-11-18 DIAGNOSIS — N186 End stage renal disease: Secondary | ICD-10-CM | POA: Diagnosis not present

## 2020-11-18 DIAGNOSIS — D631 Anemia in chronic kidney disease: Secondary | ICD-10-CM | POA: Diagnosis not present

## 2020-11-18 DIAGNOSIS — N2581 Secondary hyperparathyroidism of renal origin: Secondary | ICD-10-CM | POA: Diagnosis not present

## 2020-11-20 DIAGNOSIS — D631 Anemia in chronic kidney disease: Secondary | ICD-10-CM | POA: Diagnosis not present

## 2020-11-20 DIAGNOSIS — N186 End stage renal disease: Secondary | ICD-10-CM | POA: Diagnosis not present

## 2020-11-20 DIAGNOSIS — Z992 Dependence on renal dialysis: Secondary | ICD-10-CM | POA: Diagnosis not present

## 2020-11-20 DIAGNOSIS — N2581 Secondary hyperparathyroidism of renal origin: Secondary | ICD-10-CM | POA: Diagnosis not present

## 2020-11-23 DIAGNOSIS — D631 Anemia in chronic kidney disease: Secondary | ICD-10-CM | POA: Diagnosis not present

## 2020-11-23 DIAGNOSIS — N2581 Secondary hyperparathyroidism of renal origin: Secondary | ICD-10-CM | POA: Diagnosis not present

## 2020-11-23 DIAGNOSIS — N186 End stage renal disease: Secondary | ICD-10-CM | POA: Diagnosis not present

## 2020-11-23 DIAGNOSIS — Z992 Dependence on renal dialysis: Secondary | ICD-10-CM | POA: Diagnosis not present

## 2020-11-24 DIAGNOSIS — Z992 Dependence on renal dialysis: Secondary | ICD-10-CM | POA: Diagnosis not present

## 2020-11-24 DIAGNOSIS — I120 Hypertensive chronic kidney disease with stage 5 chronic kidney disease or end stage renal disease: Secondary | ICD-10-CM | POA: Diagnosis not present

## 2020-11-24 DIAGNOSIS — N186 End stage renal disease: Secondary | ICD-10-CM | POA: Diagnosis not present

## 2020-11-25 DIAGNOSIS — N2581 Secondary hyperparathyroidism of renal origin: Secondary | ICD-10-CM | POA: Diagnosis not present

## 2020-11-25 DIAGNOSIS — N186 End stage renal disease: Secondary | ICD-10-CM | POA: Diagnosis not present

## 2020-11-25 DIAGNOSIS — D631 Anemia in chronic kidney disease: Secondary | ICD-10-CM | POA: Diagnosis not present

## 2020-11-25 DIAGNOSIS — Z992 Dependence on renal dialysis: Secondary | ICD-10-CM | POA: Diagnosis not present

## 2020-11-27 DIAGNOSIS — N2581 Secondary hyperparathyroidism of renal origin: Secondary | ICD-10-CM | POA: Diagnosis not present

## 2020-11-27 DIAGNOSIS — Z992 Dependence on renal dialysis: Secondary | ICD-10-CM | POA: Diagnosis not present

## 2020-11-27 DIAGNOSIS — N186 End stage renal disease: Secondary | ICD-10-CM | POA: Diagnosis not present

## 2020-11-27 DIAGNOSIS — D631 Anemia in chronic kidney disease: Secondary | ICD-10-CM | POA: Diagnosis not present

## 2020-11-30 DIAGNOSIS — N2581 Secondary hyperparathyroidism of renal origin: Secondary | ICD-10-CM | POA: Diagnosis not present

## 2020-11-30 DIAGNOSIS — N186 End stage renal disease: Secondary | ICD-10-CM | POA: Diagnosis not present

## 2020-11-30 DIAGNOSIS — Z992 Dependence on renal dialysis: Secondary | ICD-10-CM | POA: Diagnosis not present

## 2020-11-30 DIAGNOSIS — D631 Anemia in chronic kidney disease: Secondary | ICD-10-CM | POA: Diagnosis not present

## 2020-12-02 DIAGNOSIS — Z992 Dependence on renal dialysis: Secondary | ICD-10-CM | POA: Diagnosis not present

## 2020-12-02 DIAGNOSIS — N2581 Secondary hyperparathyroidism of renal origin: Secondary | ICD-10-CM | POA: Diagnosis not present

## 2020-12-02 DIAGNOSIS — N186 End stage renal disease: Secondary | ICD-10-CM | POA: Diagnosis not present

## 2020-12-02 DIAGNOSIS — D631 Anemia in chronic kidney disease: Secondary | ICD-10-CM | POA: Diagnosis not present

## 2020-12-04 DIAGNOSIS — D631 Anemia in chronic kidney disease: Secondary | ICD-10-CM | POA: Diagnosis not present

## 2020-12-04 DIAGNOSIS — N2581 Secondary hyperparathyroidism of renal origin: Secondary | ICD-10-CM | POA: Diagnosis not present

## 2020-12-04 DIAGNOSIS — N186 End stage renal disease: Secondary | ICD-10-CM | POA: Diagnosis not present

## 2020-12-04 DIAGNOSIS — Z992 Dependence on renal dialysis: Secondary | ICD-10-CM | POA: Diagnosis not present

## 2020-12-07 DIAGNOSIS — N2581 Secondary hyperparathyroidism of renal origin: Secondary | ICD-10-CM | POA: Diagnosis not present

## 2020-12-07 DIAGNOSIS — D631 Anemia in chronic kidney disease: Secondary | ICD-10-CM | POA: Diagnosis not present

## 2020-12-07 DIAGNOSIS — Z992 Dependence on renal dialysis: Secondary | ICD-10-CM | POA: Diagnosis not present

## 2020-12-07 DIAGNOSIS — N186 End stage renal disease: Secondary | ICD-10-CM | POA: Diagnosis not present

## 2020-12-09 DIAGNOSIS — N186 End stage renal disease: Secondary | ICD-10-CM | POA: Diagnosis not present

## 2020-12-09 DIAGNOSIS — Z992 Dependence on renal dialysis: Secondary | ICD-10-CM | POA: Diagnosis not present

## 2020-12-09 DIAGNOSIS — N2581 Secondary hyperparathyroidism of renal origin: Secondary | ICD-10-CM | POA: Diagnosis not present

## 2020-12-09 DIAGNOSIS — D631 Anemia in chronic kidney disease: Secondary | ICD-10-CM | POA: Diagnosis not present

## 2020-12-11 DIAGNOSIS — N2581 Secondary hyperparathyroidism of renal origin: Secondary | ICD-10-CM | POA: Diagnosis not present

## 2020-12-11 DIAGNOSIS — D631 Anemia in chronic kidney disease: Secondary | ICD-10-CM | POA: Diagnosis not present

## 2020-12-11 DIAGNOSIS — Z992 Dependence on renal dialysis: Secondary | ICD-10-CM | POA: Diagnosis not present

## 2020-12-11 DIAGNOSIS — N186 End stage renal disease: Secondary | ICD-10-CM | POA: Diagnosis not present

## 2020-12-14 DIAGNOSIS — N2581 Secondary hyperparathyroidism of renal origin: Secondary | ICD-10-CM | POA: Diagnosis not present

## 2020-12-14 DIAGNOSIS — Z992 Dependence on renal dialysis: Secondary | ICD-10-CM | POA: Diagnosis not present

## 2020-12-14 DIAGNOSIS — N186 End stage renal disease: Secondary | ICD-10-CM | POA: Diagnosis not present

## 2020-12-14 DIAGNOSIS — D631 Anemia in chronic kidney disease: Secondary | ICD-10-CM | POA: Diagnosis not present

## 2020-12-16 DIAGNOSIS — D631 Anemia in chronic kidney disease: Secondary | ICD-10-CM | POA: Diagnosis not present

## 2020-12-16 DIAGNOSIS — N186 End stage renal disease: Secondary | ICD-10-CM | POA: Diagnosis not present

## 2020-12-16 DIAGNOSIS — N2581 Secondary hyperparathyroidism of renal origin: Secondary | ICD-10-CM | POA: Diagnosis not present

## 2020-12-16 DIAGNOSIS — Z992 Dependence on renal dialysis: Secondary | ICD-10-CM | POA: Diagnosis not present

## 2020-12-18 DIAGNOSIS — D631 Anemia in chronic kidney disease: Secondary | ICD-10-CM | POA: Diagnosis not present

## 2020-12-18 DIAGNOSIS — Z992 Dependence on renal dialysis: Secondary | ICD-10-CM | POA: Diagnosis not present

## 2020-12-18 DIAGNOSIS — N186 End stage renal disease: Secondary | ICD-10-CM | POA: Diagnosis not present

## 2020-12-18 DIAGNOSIS — N2581 Secondary hyperparathyroidism of renal origin: Secondary | ICD-10-CM | POA: Diagnosis not present

## 2020-12-21 DIAGNOSIS — D631 Anemia in chronic kidney disease: Secondary | ICD-10-CM | POA: Diagnosis not present

## 2020-12-21 DIAGNOSIS — N2581 Secondary hyperparathyroidism of renal origin: Secondary | ICD-10-CM | POA: Diagnosis not present

## 2020-12-21 DIAGNOSIS — N186 End stage renal disease: Secondary | ICD-10-CM | POA: Diagnosis not present

## 2020-12-21 DIAGNOSIS — Z992 Dependence on renal dialysis: Secondary | ICD-10-CM | POA: Diagnosis not present

## 2020-12-22 DIAGNOSIS — I120 Hypertensive chronic kidney disease with stage 5 chronic kidney disease or end stage renal disease: Secondary | ICD-10-CM | POA: Diagnosis not present

## 2020-12-22 DIAGNOSIS — N186 End stage renal disease: Secondary | ICD-10-CM | POA: Diagnosis not present

## 2020-12-22 DIAGNOSIS — Z992 Dependence on renal dialysis: Secondary | ICD-10-CM | POA: Diagnosis not present

## 2020-12-23 DIAGNOSIS — N2581 Secondary hyperparathyroidism of renal origin: Secondary | ICD-10-CM | POA: Diagnosis not present

## 2020-12-23 DIAGNOSIS — Z992 Dependence on renal dialysis: Secondary | ICD-10-CM | POA: Diagnosis not present

## 2020-12-23 DIAGNOSIS — D631 Anemia in chronic kidney disease: Secondary | ICD-10-CM | POA: Diagnosis not present

## 2020-12-23 DIAGNOSIS — N186 End stage renal disease: Secondary | ICD-10-CM | POA: Diagnosis not present

## 2020-12-25 DIAGNOSIS — D631 Anemia in chronic kidney disease: Secondary | ICD-10-CM | POA: Diagnosis not present

## 2020-12-25 DIAGNOSIS — N186 End stage renal disease: Secondary | ICD-10-CM | POA: Diagnosis not present

## 2020-12-25 DIAGNOSIS — N2581 Secondary hyperparathyroidism of renal origin: Secondary | ICD-10-CM | POA: Diagnosis not present

## 2020-12-25 DIAGNOSIS — Z992 Dependence on renal dialysis: Secondary | ICD-10-CM | POA: Diagnosis not present

## 2020-12-28 DIAGNOSIS — Z992 Dependence on renal dialysis: Secondary | ICD-10-CM | POA: Diagnosis not present

## 2020-12-28 DIAGNOSIS — N186 End stage renal disease: Secondary | ICD-10-CM | POA: Diagnosis not present

## 2020-12-28 DIAGNOSIS — D631 Anemia in chronic kidney disease: Secondary | ICD-10-CM | POA: Diagnosis not present

## 2020-12-28 DIAGNOSIS — N2581 Secondary hyperparathyroidism of renal origin: Secondary | ICD-10-CM | POA: Diagnosis not present

## 2020-12-30 DIAGNOSIS — D631 Anemia in chronic kidney disease: Secondary | ICD-10-CM | POA: Diagnosis not present

## 2020-12-30 DIAGNOSIS — N186 End stage renal disease: Secondary | ICD-10-CM | POA: Diagnosis not present

## 2020-12-30 DIAGNOSIS — Z992 Dependence on renal dialysis: Secondary | ICD-10-CM | POA: Diagnosis not present

## 2020-12-30 DIAGNOSIS — N2581 Secondary hyperparathyroidism of renal origin: Secondary | ICD-10-CM | POA: Diagnosis not present

## 2021-01-01 DIAGNOSIS — N2581 Secondary hyperparathyroidism of renal origin: Secondary | ICD-10-CM | POA: Diagnosis not present

## 2021-01-01 DIAGNOSIS — Z992 Dependence on renal dialysis: Secondary | ICD-10-CM | POA: Diagnosis not present

## 2021-01-01 DIAGNOSIS — D631 Anemia in chronic kidney disease: Secondary | ICD-10-CM | POA: Diagnosis not present

## 2021-01-01 DIAGNOSIS — N186 End stage renal disease: Secondary | ICD-10-CM | POA: Diagnosis not present

## 2021-01-04 DIAGNOSIS — D631 Anemia in chronic kidney disease: Secondary | ICD-10-CM | POA: Diagnosis not present

## 2021-01-04 DIAGNOSIS — N186 End stage renal disease: Secondary | ICD-10-CM | POA: Diagnosis not present

## 2021-01-04 DIAGNOSIS — N2581 Secondary hyperparathyroidism of renal origin: Secondary | ICD-10-CM | POA: Diagnosis not present

## 2021-01-04 DIAGNOSIS — Z992 Dependence on renal dialysis: Secondary | ICD-10-CM | POA: Diagnosis not present

## 2021-01-06 DIAGNOSIS — Z992 Dependence on renal dialysis: Secondary | ICD-10-CM | POA: Diagnosis not present

## 2021-01-06 DIAGNOSIS — N2581 Secondary hyperparathyroidism of renal origin: Secondary | ICD-10-CM | POA: Diagnosis not present

## 2021-01-06 DIAGNOSIS — N186 End stage renal disease: Secondary | ICD-10-CM | POA: Diagnosis not present

## 2021-01-06 DIAGNOSIS — D631 Anemia in chronic kidney disease: Secondary | ICD-10-CM | POA: Diagnosis not present

## 2021-01-08 DIAGNOSIS — D631 Anemia in chronic kidney disease: Secondary | ICD-10-CM | POA: Diagnosis not present

## 2021-01-08 DIAGNOSIS — N2581 Secondary hyperparathyroidism of renal origin: Secondary | ICD-10-CM | POA: Diagnosis not present

## 2021-01-08 DIAGNOSIS — N186 End stage renal disease: Secondary | ICD-10-CM | POA: Diagnosis not present

## 2021-01-08 DIAGNOSIS — Z992 Dependence on renal dialysis: Secondary | ICD-10-CM | POA: Diagnosis not present

## 2021-01-11 ENCOUNTER — Encounter (INDEPENDENT_AMBULATORY_CARE_PROVIDER_SITE_OTHER): Payer: Self-pay

## 2021-01-11 DIAGNOSIS — N186 End stage renal disease: Secondary | ICD-10-CM | POA: Diagnosis not present

## 2021-01-11 DIAGNOSIS — N2581 Secondary hyperparathyroidism of renal origin: Secondary | ICD-10-CM | POA: Diagnosis not present

## 2021-01-11 DIAGNOSIS — D631 Anemia in chronic kidney disease: Secondary | ICD-10-CM | POA: Diagnosis not present

## 2021-01-11 DIAGNOSIS — Z992 Dependence on renal dialysis: Secondary | ICD-10-CM | POA: Diagnosis not present

## 2021-01-13 DIAGNOSIS — D631 Anemia in chronic kidney disease: Secondary | ICD-10-CM | POA: Diagnosis not present

## 2021-01-13 DIAGNOSIS — Z992 Dependence on renal dialysis: Secondary | ICD-10-CM | POA: Diagnosis not present

## 2021-01-13 DIAGNOSIS — N186 End stage renal disease: Secondary | ICD-10-CM | POA: Diagnosis not present

## 2021-01-13 DIAGNOSIS — N2581 Secondary hyperparathyroidism of renal origin: Secondary | ICD-10-CM | POA: Diagnosis not present

## 2021-01-15 DIAGNOSIS — N186 End stage renal disease: Secondary | ICD-10-CM | POA: Diagnosis not present

## 2021-01-15 DIAGNOSIS — D631 Anemia in chronic kidney disease: Secondary | ICD-10-CM | POA: Diagnosis not present

## 2021-01-15 DIAGNOSIS — Z992 Dependence on renal dialysis: Secondary | ICD-10-CM | POA: Diagnosis not present

## 2021-01-15 DIAGNOSIS — N2581 Secondary hyperparathyroidism of renal origin: Secondary | ICD-10-CM | POA: Diagnosis not present

## 2021-01-18 DIAGNOSIS — N2581 Secondary hyperparathyroidism of renal origin: Secondary | ICD-10-CM | POA: Diagnosis not present

## 2021-01-18 DIAGNOSIS — D631 Anemia in chronic kidney disease: Secondary | ICD-10-CM | POA: Diagnosis not present

## 2021-01-18 DIAGNOSIS — Z992 Dependence on renal dialysis: Secondary | ICD-10-CM | POA: Diagnosis not present

## 2021-01-18 DIAGNOSIS — N186 End stage renal disease: Secondary | ICD-10-CM | POA: Diagnosis not present

## 2021-01-20 DIAGNOSIS — D631 Anemia in chronic kidney disease: Secondary | ICD-10-CM | POA: Diagnosis not present

## 2021-01-20 DIAGNOSIS — N186 End stage renal disease: Secondary | ICD-10-CM | POA: Diagnosis not present

## 2021-01-20 DIAGNOSIS — N2581 Secondary hyperparathyroidism of renal origin: Secondary | ICD-10-CM | POA: Diagnosis not present

## 2021-01-20 DIAGNOSIS — Z992 Dependence on renal dialysis: Secondary | ICD-10-CM | POA: Diagnosis not present

## 2021-01-22 DIAGNOSIS — N186 End stage renal disease: Secondary | ICD-10-CM | POA: Diagnosis not present

## 2021-01-22 DIAGNOSIS — N2581 Secondary hyperparathyroidism of renal origin: Secondary | ICD-10-CM | POA: Diagnosis not present

## 2021-01-22 DIAGNOSIS — D631 Anemia in chronic kidney disease: Secondary | ICD-10-CM | POA: Diagnosis not present

## 2021-01-22 DIAGNOSIS — I120 Hypertensive chronic kidney disease with stage 5 chronic kidney disease or end stage renal disease: Secondary | ICD-10-CM | POA: Diagnosis not present

## 2021-01-22 DIAGNOSIS — Z992 Dependence on renal dialysis: Secondary | ICD-10-CM | POA: Diagnosis not present

## 2021-01-25 DIAGNOSIS — N186 End stage renal disease: Secondary | ICD-10-CM | POA: Diagnosis not present

## 2021-01-25 DIAGNOSIS — Z992 Dependence on renal dialysis: Secondary | ICD-10-CM | POA: Diagnosis not present

## 2021-01-25 DIAGNOSIS — N2581 Secondary hyperparathyroidism of renal origin: Secondary | ICD-10-CM | POA: Diagnosis not present

## 2021-01-25 DIAGNOSIS — D631 Anemia in chronic kidney disease: Secondary | ICD-10-CM | POA: Diagnosis not present

## 2021-01-27 DIAGNOSIS — Z992 Dependence on renal dialysis: Secondary | ICD-10-CM | POA: Diagnosis not present

## 2021-01-27 DIAGNOSIS — D631 Anemia in chronic kidney disease: Secondary | ICD-10-CM | POA: Diagnosis not present

## 2021-01-27 DIAGNOSIS — N186 End stage renal disease: Secondary | ICD-10-CM | POA: Diagnosis not present

## 2021-01-27 DIAGNOSIS — N2581 Secondary hyperparathyroidism of renal origin: Secondary | ICD-10-CM | POA: Diagnosis not present

## 2021-01-29 DIAGNOSIS — D631 Anemia in chronic kidney disease: Secondary | ICD-10-CM | POA: Diagnosis not present

## 2021-01-29 DIAGNOSIS — Z992 Dependence on renal dialysis: Secondary | ICD-10-CM | POA: Diagnosis not present

## 2021-01-29 DIAGNOSIS — N2581 Secondary hyperparathyroidism of renal origin: Secondary | ICD-10-CM | POA: Diagnosis not present

## 2021-01-29 DIAGNOSIS — N186 End stage renal disease: Secondary | ICD-10-CM | POA: Diagnosis not present

## 2021-02-01 DIAGNOSIS — Z992 Dependence on renal dialysis: Secondary | ICD-10-CM | POA: Diagnosis not present

## 2021-02-01 DIAGNOSIS — N186 End stage renal disease: Secondary | ICD-10-CM | POA: Diagnosis not present

## 2021-02-01 DIAGNOSIS — D631 Anemia in chronic kidney disease: Secondary | ICD-10-CM | POA: Diagnosis not present

## 2021-02-01 DIAGNOSIS — N2581 Secondary hyperparathyroidism of renal origin: Secondary | ICD-10-CM | POA: Diagnosis not present

## 2021-02-03 DIAGNOSIS — D631 Anemia in chronic kidney disease: Secondary | ICD-10-CM | POA: Diagnosis not present

## 2021-02-03 DIAGNOSIS — Z992 Dependence on renal dialysis: Secondary | ICD-10-CM | POA: Diagnosis not present

## 2021-02-03 DIAGNOSIS — N186 End stage renal disease: Secondary | ICD-10-CM | POA: Diagnosis not present

## 2021-02-03 DIAGNOSIS — N2581 Secondary hyperparathyroidism of renal origin: Secondary | ICD-10-CM | POA: Diagnosis not present

## 2021-02-05 DIAGNOSIS — N2581 Secondary hyperparathyroidism of renal origin: Secondary | ICD-10-CM | POA: Diagnosis not present

## 2021-02-05 DIAGNOSIS — N186 End stage renal disease: Secondary | ICD-10-CM | POA: Diagnosis not present

## 2021-02-05 DIAGNOSIS — D631 Anemia in chronic kidney disease: Secondary | ICD-10-CM | POA: Diagnosis not present

## 2021-02-05 DIAGNOSIS — Z992 Dependence on renal dialysis: Secondary | ICD-10-CM | POA: Diagnosis not present

## 2021-02-08 DIAGNOSIS — Z992 Dependence on renal dialysis: Secondary | ICD-10-CM | POA: Diagnosis not present

## 2021-02-08 DIAGNOSIS — D631 Anemia in chronic kidney disease: Secondary | ICD-10-CM | POA: Diagnosis not present

## 2021-02-08 DIAGNOSIS — N2581 Secondary hyperparathyroidism of renal origin: Secondary | ICD-10-CM | POA: Diagnosis not present

## 2021-02-08 DIAGNOSIS — N186 End stage renal disease: Secondary | ICD-10-CM | POA: Diagnosis not present

## 2021-02-10 DIAGNOSIS — D631 Anemia in chronic kidney disease: Secondary | ICD-10-CM | POA: Diagnosis not present

## 2021-02-10 DIAGNOSIS — Z992 Dependence on renal dialysis: Secondary | ICD-10-CM | POA: Diagnosis not present

## 2021-02-10 DIAGNOSIS — N186 End stage renal disease: Secondary | ICD-10-CM | POA: Diagnosis not present

## 2021-02-10 DIAGNOSIS — N2581 Secondary hyperparathyroidism of renal origin: Secondary | ICD-10-CM | POA: Diagnosis not present

## 2021-02-12 DIAGNOSIS — Z992 Dependence on renal dialysis: Secondary | ICD-10-CM | POA: Diagnosis not present

## 2021-02-12 DIAGNOSIS — N186 End stage renal disease: Secondary | ICD-10-CM | POA: Diagnosis not present

## 2021-02-12 DIAGNOSIS — N2581 Secondary hyperparathyroidism of renal origin: Secondary | ICD-10-CM | POA: Diagnosis not present

## 2021-02-12 DIAGNOSIS — D631 Anemia in chronic kidney disease: Secondary | ICD-10-CM | POA: Diagnosis not present

## 2021-02-15 DIAGNOSIS — Z992 Dependence on renal dialysis: Secondary | ICD-10-CM | POA: Diagnosis not present

## 2021-02-15 DIAGNOSIS — N2581 Secondary hyperparathyroidism of renal origin: Secondary | ICD-10-CM | POA: Diagnosis not present

## 2021-02-15 DIAGNOSIS — D631 Anemia in chronic kidney disease: Secondary | ICD-10-CM | POA: Diagnosis not present

## 2021-02-15 DIAGNOSIS — N186 End stage renal disease: Secondary | ICD-10-CM | POA: Diagnosis not present

## 2021-02-17 DIAGNOSIS — N2581 Secondary hyperparathyroidism of renal origin: Secondary | ICD-10-CM | POA: Diagnosis not present

## 2021-02-17 DIAGNOSIS — N186 End stage renal disease: Secondary | ICD-10-CM | POA: Diagnosis not present

## 2021-02-17 DIAGNOSIS — D631 Anemia in chronic kidney disease: Secondary | ICD-10-CM | POA: Diagnosis not present

## 2021-02-17 DIAGNOSIS — Z992 Dependence on renal dialysis: Secondary | ICD-10-CM | POA: Diagnosis not present

## 2021-02-19 DIAGNOSIS — N186 End stage renal disease: Secondary | ICD-10-CM | POA: Diagnosis not present

## 2021-02-19 DIAGNOSIS — N2581 Secondary hyperparathyroidism of renal origin: Secondary | ICD-10-CM | POA: Diagnosis not present

## 2021-02-19 DIAGNOSIS — Z992 Dependence on renal dialysis: Secondary | ICD-10-CM | POA: Diagnosis not present

## 2021-02-19 DIAGNOSIS — D631 Anemia in chronic kidney disease: Secondary | ICD-10-CM | POA: Diagnosis not present

## 2021-02-21 DIAGNOSIS — N186 End stage renal disease: Secondary | ICD-10-CM | POA: Diagnosis not present

## 2021-02-21 DIAGNOSIS — Z992 Dependence on renal dialysis: Secondary | ICD-10-CM | POA: Diagnosis not present

## 2021-02-21 DIAGNOSIS — I120 Hypertensive chronic kidney disease with stage 5 chronic kidney disease or end stage renal disease: Secondary | ICD-10-CM | POA: Diagnosis not present

## 2021-02-22 DIAGNOSIS — Z992 Dependence on renal dialysis: Secondary | ICD-10-CM | POA: Diagnosis not present

## 2021-02-22 DIAGNOSIS — N186 End stage renal disease: Secondary | ICD-10-CM | POA: Diagnosis not present

## 2021-02-22 DIAGNOSIS — N2581 Secondary hyperparathyroidism of renal origin: Secondary | ICD-10-CM | POA: Diagnosis not present

## 2021-02-22 DIAGNOSIS — D631 Anemia in chronic kidney disease: Secondary | ICD-10-CM | POA: Diagnosis not present

## 2021-02-24 DIAGNOSIS — N186 End stage renal disease: Secondary | ICD-10-CM | POA: Diagnosis not present

## 2021-02-24 DIAGNOSIS — Z992 Dependence on renal dialysis: Secondary | ICD-10-CM | POA: Diagnosis not present

## 2021-02-24 DIAGNOSIS — N2581 Secondary hyperparathyroidism of renal origin: Secondary | ICD-10-CM | POA: Diagnosis not present

## 2021-02-24 DIAGNOSIS — D631 Anemia in chronic kidney disease: Secondary | ICD-10-CM | POA: Diagnosis not present

## 2021-02-26 DIAGNOSIS — D631 Anemia in chronic kidney disease: Secondary | ICD-10-CM | POA: Diagnosis not present

## 2021-02-26 DIAGNOSIS — N186 End stage renal disease: Secondary | ICD-10-CM | POA: Diagnosis not present

## 2021-02-26 DIAGNOSIS — Z992 Dependence on renal dialysis: Secondary | ICD-10-CM | POA: Diagnosis not present

## 2021-02-26 DIAGNOSIS — N2581 Secondary hyperparathyroidism of renal origin: Secondary | ICD-10-CM | POA: Diagnosis not present

## 2021-03-01 DIAGNOSIS — N2581 Secondary hyperparathyroidism of renal origin: Secondary | ICD-10-CM | POA: Diagnosis not present

## 2021-03-01 DIAGNOSIS — Z992 Dependence on renal dialysis: Secondary | ICD-10-CM | POA: Diagnosis not present

## 2021-03-01 DIAGNOSIS — D631 Anemia in chronic kidney disease: Secondary | ICD-10-CM | POA: Diagnosis not present

## 2021-03-01 DIAGNOSIS — N186 End stage renal disease: Secondary | ICD-10-CM | POA: Diagnosis not present

## 2021-03-03 DIAGNOSIS — N186 End stage renal disease: Secondary | ICD-10-CM | POA: Diagnosis not present

## 2021-03-03 DIAGNOSIS — N2581 Secondary hyperparathyroidism of renal origin: Secondary | ICD-10-CM | POA: Diagnosis not present

## 2021-03-03 DIAGNOSIS — D631 Anemia in chronic kidney disease: Secondary | ICD-10-CM | POA: Diagnosis not present

## 2021-03-03 DIAGNOSIS — Z992 Dependence on renal dialysis: Secondary | ICD-10-CM | POA: Diagnosis not present

## 2021-03-05 DIAGNOSIS — N2581 Secondary hyperparathyroidism of renal origin: Secondary | ICD-10-CM | POA: Diagnosis not present

## 2021-03-05 DIAGNOSIS — Z992 Dependence on renal dialysis: Secondary | ICD-10-CM | POA: Diagnosis not present

## 2021-03-05 DIAGNOSIS — D631 Anemia in chronic kidney disease: Secondary | ICD-10-CM | POA: Diagnosis not present

## 2021-03-05 DIAGNOSIS — N186 End stage renal disease: Secondary | ICD-10-CM | POA: Diagnosis not present

## 2021-03-08 DIAGNOSIS — N186 End stage renal disease: Secondary | ICD-10-CM | POA: Diagnosis not present

## 2021-03-08 DIAGNOSIS — Z992 Dependence on renal dialysis: Secondary | ICD-10-CM | POA: Diagnosis not present

## 2021-03-08 DIAGNOSIS — N2581 Secondary hyperparathyroidism of renal origin: Secondary | ICD-10-CM | POA: Diagnosis not present

## 2021-03-08 DIAGNOSIS — D631 Anemia in chronic kidney disease: Secondary | ICD-10-CM | POA: Diagnosis not present

## 2021-03-10 DIAGNOSIS — Z992 Dependence on renal dialysis: Secondary | ICD-10-CM | POA: Diagnosis not present

## 2021-03-10 DIAGNOSIS — N186 End stage renal disease: Secondary | ICD-10-CM | POA: Diagnosis not present

## 2021-03-10 DIAGNOSIS — D631 Anemia in chronic kidney disease: Secondary | ICD-10-CM | POA: Diagnosis not present

## 2021-03-10 DIAGNOSIS — N2581 Secondary hyperparathyroidism of renal origin: Secondary | ICD-10-CM | POA: Diagnosis not present

## 2021-03-12 DIAGNOSIS — N2581 Secondary hyperparathyroidism of renal origin: Secondary | ICD-10-CM | POA: Diagnosis not present

## 2021-03-12 DIAGNOSIS — Z992 Dependence on renal dialysis: Secondary | ICD-10-CM | POA: Diagnosis not present

## 2021-03-12 DIAGNOSIS — D631 Anemia in chronic kidney disease: Secondary | ICD-10-CM | POA: Diagnosis not present

## 2021-03-12 DIAGNOSIS — N186 End stage renal disease: Secondary | ICD-10-CM | POA: Diagnosis not present

## 2021-03-15 DIAGNOSIS — D631 Anemia in chronic kidney disease: Secondary | ICD-10-CM | POA: Diagnosis not present

## 2021-03-15 DIAGNOSIS — Z992 Dependence on renal dialysis: Secondary | ICD-10-CM | POA: Diagnosis not present

## 2021-03-15 DIAGNOSIS — N186 End stage renal disease: Secondary | ICD-10-CM | POA: Diagnosis not present

## 2021-03-15 DIAGNOSIS — N2581 Secondary hyperparathyroidism of renal origin: Secondary | ICD-10-CM | POA: Diagnosis not present

## 2021-03-17 DIAGNOSIS — Z992 Dependence on renal dialysis: Secondary | ICD-10-CM | POA: Diagnosis not present

## 2021-03-17 DIAGNOSIS — D631 Anemia in chronic kidney disease: Secondary | ICD-10-CM | POA: Diagnosis not present

## 2021-03-17 DIAGNOSIS — N186 End stage renal disease: Secondary | ICD-10-CM | POA: Diagnosis not present

## 2021-03-17 DIAGNOSIS — N2581 Secondary hyperparathyroidism of renal origin: Secondary | ICD-10-CM | POA: Diagnosis not present

## 2021-03-19 DIAGNOSIS — Z992 Dependence on renal dialysis: Secondary | ICD-10-CM | POA: Diagnosis not present

## 2021-03-19 DIAGNOSIS — N2581 Secondary hyperparathyroidism of renal origin: Secondary | ICD-10-CM | POA: Diagnosis not present

## 2021-03-19 DIAGNOSIS — N186 End stage renal disease: Secondary | ICD-10-CM | POA: Diagnosis not present

## 2021-03-19 DIAGNOSIS — D631 Anemia in chronic kidney disease: Secondary | ICD-10-CM | POA: Diagnosis not present

## 2021-03-22 DIAGNOSIS — N186 End stage renal disease: Secondary | ICD-10-CM | POA: Diagnosis not present

## 2021-03-22 DIAGNOSIS — D631 Anemia in chronic kidney disease: Secondary | ICD-10-CM | POA: Diagnosis not present

## 2021-03-22 DIAGNOSIS — N2581 Secondary hyperparathyroidism of renal origin: Secondary | ICD-10-CM | POA: Diagnosis not present

## 2021-03-22 DIAGNOSIS — Z992 Dependence on renal dialysis: Secondary | ICD-10-CM | POA: Diagnosis not present

## 2021-03-24 DIAGNOSIS — Z992 Dependence on renal dialysis: Secondary | ICD-10-CM | POA: Diagnosis not present

## 2021-03-24 DIAGNOSIS — N2581 Secondary hyperparathyroidism of renal origin: Secondary | ICD-10-CM | POA: Diagnosis not present

## 2021-03-24 DIAGNOSIS — N186 End stage renal disease: Secondary | ICD-10-CM | POA: Diagnosis not present

## 2021-03-24 DIAGNOSIS — I120 Hypertensive chronic kidney disease with stage 5 chronic kidney disease or end stage renal disease: Secondary | ICD-10-CM | POA: Diagnosis not present

## 2021-03-24 DIAGNOSIS — D631 Anemia in chronic kidney disease: Secondary | ICD-10-CM | POA: Diagnosis not present

## 2021-03-26 DIAGNOSIS — N186 End stage renal disease: Secondary | ICD-10-CM | POA: Diagnosis not present

## 2021-03-26 DIAGNOSIS — Z992 Dependence on renal dialysis: Secondary | ICD-10-CM | POA: Diagnosis not present

## 2021-03-26 DIAGNOSIS — D631 Anemia in chronic kidney disease: Secondary | ICD-10-CM | POA: Diagnosis not present

## 2021-03-26 DIAGNOSIS — N2581 Secondary hyperparathyroidism of renal origin: Secondary | ICD-10-CM | POA: Diagnosis not present

## 2021-03-29 DIAGNOSIS — D631 Anemia in chronic kidney disease: Secondary | ICD-10-CM | POA: Diagnosis not present

## 2021-03-29 DIAGNOSIS — N186 End stage renal disease: Secondary | ICD-10-CM | POA: Diagnosis not present

## 2021-03-29 DIAGNOSIS — N2581 Secondary hyperparathyroidism of renal origin: Secondary | ICD-10-CM | POA: Diagnosis not present

## 2021-03-29 DIAGNOSIS — Z992 Dependence on renal dialysis: Secondary | ICD-10-CM | POA: Diagnosis not present

## 2021-03-31 DIAGNOSIS — N2581 Secondary hyperparathyroidism of renal origin: Secondary | ICD-10-CM | POA: Diagnosis not present

## 2021-03-31 DIAGNOSIS — N186 End stage renal disease: Secondary | ICD-10-CM | POA: Diagnosis not present

## 2021-03-31 DIAGNOSIS — D631 Anemia in chronic kidney disease: Secondary | ICD-10-CM | POA: Diagnosis not present

## 2021-03-31 DIAGNOSIS — Z992 Dependence on renal dialysis: Secondary | ICD-10-CM | POA: Diagnosis not present

## 2021-04-02 DIAGNOSIS — N2581 Secondary hyperparathyroidism of renal origin: Secondary | ICD-10-CM | POA: Diagnosis not present

## 2021-04-02 DIAGNOSIS — Z992 Dependence on renal dialysis: Secondary | ICD-10-CM | POA: Diagnosis not present

## 2021-04-02 DIAGNOSIS — N186 End stage renal disease: Secondary | ICD-10-CM | POA: Diagnosis not present

## 2021-04-02 DIAGNOSIS — D631 Anemia in chronic kidney disease: Secondary | ICD-10-CM | POA: Diagnosis not present

## 2021-04-05 DIAGNOSIS — Z992 Dependence on renal dialysis: Secondary | ICD-10-CM | POA: Diagnosis not present

## 2021-04-05 DIAGNOSIS — N2581 Secondary hyperparathyroidism of renal origin: Secondary | ICD-10-CM | POA: Diagnosis not present

## 2021-04-05 DIAGNOSIS — D631 Anemia in chronic kidney disease: Secondary | ICD-10-CM | POA: Diagnosis not present

## 2021-04-05 DIAGNOSIS — N186 End stage renal disease: Secondary | ICD-10-CM | POA: Diagnosis not present

## 2021-04-07 DIAGNOSIS — N186 End stage renal disease: Secondary | ICD-10-CM | POA: Diagnosis not present

## 2021-04-07 DIAGNOSIS — D631 Anemia in chronic kidney disease: Secondary | ICD-10-CM | POA: Diagnosis not present

## 2021-04-07 DIAGNOSIS — N2581 Secondary hyperparathyroidism of renal origin: Secondary | ICD-10-CM | POA: Diagnosis not present

## 2021-04-07 DIAGNOSIS — Z992 Dependence on renal dialysis: Secondary | ICD-10-CM | POA: Diagnosis not present

## 2021-04-08 DIAGNOSIS — N186 End stage renal disease: Secondary | ICD-10-CM | POA: Diagnosis not present

## 2021-04-08 DIAGNOSIS — I871 Compression of vein: Secondary | ICD-10-CM | POA: Diagnosis not present

## 2021-04-08 DIAGNOSIS — Z992 Dependence on renal dialysis: Secondary | ICD-10-CM | POA: Diagnosis not present

## 2021-04-09 DIAGNOSIS — D631 Anemia in chronic kidney disease: Secondary | ICD-10-CM | POA: Diagnosis not present

## 2021-04-09 DIAGNOSIS — Z992 Dependence on renal dialysis: Secondary | ICD-10-CM | POA: Diagnosis not present

## 2021-04-09 DIAGNOSIS — N186 End stage renal disease: Secondary | ICD-10-CM | POA: Diagnosis not present

## 2021-04-09 DIAGNOSIS — N2581 Secondary hyperparathyroidism of renal origin: Secondary | ICD-10-CM | POA: Diagnosis not present

## 2021-04-12 DIAGNOSIS — Z992 Dependence on renal dialysis: Secondary | ICD-10-CM | POA: Diagnosis not present

## 2021-04-12 DIAGNOSIS — N2581 Secondary hyperparathyroidism of renal origin: Secondary | ICD-10-CM | POA: Diagnosis not present

## 2021-04-12 DIAGNOSIS — N186 End stage renal disease: Secondary | ICD-10-CM | POA: Diagnosis not present

## 2021-04-12 DIAGNOSIS — D631 Anemia in chronic kidney disease: Secondary | ICD-10-CM | POA: Diagnosis not present

## 2021-04-14 DIAGNOSIS — D631 Anemia in chronic kidney disease: Secondary | ICD-10-CM | POA: Diagnosis not present

## 2021-04-14 DIAGNOSIS — Z992 Dependence on renal dialysis: Secondary | ICD-10-CM | POA: Diagnosis not present

## 2021-04-14 DIAGNOSIS — N186 End stage renal disease: Secondary | ICD-10-CM | POA: Diagnosis not present

## 2021-04-14 DIAGNOSIS — N2581 Secondary hyperparathyroidism of renal origin: Secondary | ICD-10-CM | POA: Diagnosis not present

## 2021-04-16 DIAGNOSIS — N2581 Secondary hyperparathyroidism of renal origin: Secondary | ICD-10-CM | POA: Diagnosis not present

## 2021-04-16 DIAGNOSIS — N186 End stage renal disease: Secondary | ICD-10-CM | POA: Diagnosis not present

## 2021-04-16 DIAGNOSIS — D631 Anemia in chronic kidney disease: Secondary | ICD-10-CM | POA: Diagnosis not present

## 2021-04-16 DIAGNOSIS — Z992 Dependence on renal dialysis: Secondary | ICD-10-CM | POA: Diagnosis not present

## 2021-04-19 DIAGNOSIS — Z992 Dependence on renal dialysis: Secondary | ICD-10-CM | POA: Diagnosis not present

## 2021-04-19 DIAGNOSIS — D631 Anemia in chronic kidney disease: Secondary | ICD-10-CM | POA: Diagnosis not present

## 2021-04-19 DIAGNOSIS — N186 End stage renal disease: Secondary | ICD-10-CM | POA: Diagnosis not present

## 2021-04-19 DIAGNOSIS — N2581 Secondary hyperparathyroidism of renal origin: Secondary | ICD-10-CM | POA: Diagnosis not present

## 2021-04-21 DIAGNOSIS — Z992 Dependence on renal dialysis: Secondary | ICD-10-CM | POA: Diagnosis not present

## 2021-04-21 DIAGNOSIS — N186 End stage renal disease: Secondary | ICD-10-CM | POA: Diagnosis not present

## 2021-04-21 DIAGNOSIS — N2581 Secondary hyperparathyroidism of renal origin: Secondary | ICD-10-CM | POA: Diagnosis not present

## 2021-04-21 DIAGNOSIS — D631 Anemia in chronic kidney disease: Secondary | ICD-10-CM | POA: Diagnosis not present

## 2021-04-23 DIAGNOSIS — I120 Hypertensive chronic kidney disease with stage 5 chronic kidney disease or end stage renal disease: Secondary | ICD-10-CM | POA: Diagnosis not present

## 2021-04-23 DIAGNOSIS — Z992 Dependence on renal dialysis: Secondary | ICD-10-CM | POA: Diagnosis not present

## 2021-04-23 DIAGNOSIS — N2581 Secondary hyperparathyroidism of renal origin: Secondary | ICD-10-CM | POA: Diagnosis not present

## 2021-04-23 DIAGNOSIS — D631 Anemia in chronic kidney disease: Secondary | ICD-10-CM | POA: Diagnosis not present

## 2021-04-23 DIAGNOSIS — N186 End stage renal disease: Secondary | ICD-10-CM | POA: Diagnosis not present

## 2021-04-26 DIAGNOSIS — N186 End stage renal disease: Secondary | ICD-10-CM | POA: Diagnosis not present

## 2021-04-26 DIAGNOSIS — Z992 Dependence on renal dialysis: Secondary | ICD-10-CM | POA: Diagnosis not present

## 2021-04-26 DIAGNOSIS — N2581 Secondary hyperparathyroidism of renal origin: Secondary | ICD-10-CM | POA: Diagnosis not present

## 2021-04-26 DIAGNOSIS — D631 Anemia in chronic kidney disease: Secondary | ICD-10-CM | POA: Diagnosis not present

## 2021-04-28 DIAGNOSIS — N186 End stage renal disease: Secondary | ICD-10-CM | POA: Diagnosis not present

## 2021-04-28 DIAGNOSIS — Z992 Dependence on renal dialysis: Secondary | ICD-10-CM | POA: Diagnosis not present

## 2021-04-28 DIAGNOSIS — D631 Anemia in chronic kidney disease: Secondary | ICD-10-CM | POA: Diagnosis not present

## 2021-04-28 DIAGNOSIS — N2581 Secondary hyperparathyroidism of renal origin: Secondary | ICD-10-CM | POA: Diagnosis not present

## 2021-04-30 DIAGNOSIS — N186 End stage renal disease: Secondary | ICD-10-CM | POA: Diagnosis not present

## 2021-04-30 DIAGNOSIS — D631 Anemia in chronic kidney disease: Secondary | ICD-10-CM | POA: Diagnosis not present

## 2021-04-30 DIAGNOSIS — Z992 Dependence on renal dialysis: Secondary | ICD-10-CM | POA: Diagnosis not present

## 2021-04-30 DIAGNOSIS — N2581 Secondary hyperparathyroidism of renal origin: Secondary | ICD-10-CM | POA: Diagnosis not present

## 2021-05-03 DIAGNOSIS — D631 Anemia in chronic kidney disease: Secondary | ICD-10-CM | POA: Diagnosis not present

## 2021-05-03 DIAGNOSIS — N186 End stage renal disease: Secondary | ICD-10-CM | POA: Diagnosis not present

## 2021-05-03 DIAGNOSIS — N2581 Secondary hyperparathyroidism of renal origin: Secondary | ICD-10-CM | POA: Diagnosis not present

## 2021-05-03 DIAGNOSIS — Z992 Dependence on renal dialysis: Secondary | ICD-10-CM | POA: Diagnosis not present

## 2021-05-05 DIAGNOSIS — D631 Anemia in chronic kidney disease: Secondary | ICD-10-CM | POA: Diagnosis not present

## 2021-05-05 DIAGNOSIS — N2581 Secondary hyperparathyroidism of renal origin: Secondary | ICD-10-CM | POA: Diagnosis not present

## 2021-05-05 DIAGNOSIS — Z992 Dependence on renal dialysis: Secondary | ICD-10-CM | POA: Diagnosis not present

## 2021-05-05 DIAGNOSIS — N186 End stage renal disease: Secondary | ICD-10-CM | POA: Diagnosis not present

## 2021-05-07 DIAGNOSIS — N2581 Secondary hyperparathyroidism of renal origin: Secondary | ICD-10-CM | POA: Diagnosis not present

## 2021-05-07 DIAGNOSIS — N186 End stage renal disease: Secondary | ICD-10-CM | POA: Diagnosis not present

## 2021-05-07 DIAGNOSIS — D631 Anemia in chronic kidney disease: Secondary | ICD-10-CM | POA: Diagnosis not present

## 2021-05-07 DIAGNOSIS — Z992 Dependence on renal dialysis: Secondary | ICD-10-CM | POA: Diagnosis not present

## 2021-05-10 DIAGNOSIS — D631 Anemia in chronic kidney disease: Secondary | ICD-10-CM | POA: Diagnosis not present

## 2021-05-10 DIAGNOSIS — N2581 Secondary hyperparathyroidism of renal origin: Secondary | ICD-10-CM | POA: Diagnosis not present

## 2021-05-10 DIAGNOSIS — Z992 Dependence on renal dialysis: Secondary | ICD-10-CM | POA: Diagnosis not present

## 2021-05-10 DIAGNOSIS — N186 End stage renal disease: Secondary | ICD-10-CM | POA: Diagnosis not present

## 2021-05-12 DIAGNOSIS — N186 End stage renal disease: Secondary | ICD-10-CM | POA: Diagnosis not present

## 2021-05-12 DIAGNOSIS — Z992 Dependence on renal dialysis: Secondary | ICD-10-CM | POA: Diagnosis not present

## 2021-05-12 DIAGNOSIS — N2581 Secondary hyperparathyroidism of renal origin: Secondary | ICD-10-CM | POA: Diagnosis not present

## 2021-05-12 DIAGNOSIS — D631 Anemia in chronic kidney disease: Secondary | ICD-10-CM | POA: Diagnosis not present

## 2021-05-14 DIAGNOSIS — D631 Anemia in chronic kidney disease: Secondary | ICD-10-CM | POA: Diagnosis not present

## 2021-05-14 DIAGNOSIS — N186 End stage renal disease: Secondary | ICD-10-CM | POA: Diagnosis not present

## 2021-05-14 DIAGNOSIS — N2581 Secondary hyperparathyroidism of renal origin: Secondary | ICD-10-CM | POA: Diagnosis not present

## 2021-05-14 DIAGNOSIS — Z992 Dependence on renal dialysis: Secondary | ICD-10-CM | POA: Diagnosis not present

## 2021-05-17 DIAGNOSIS — Z992 Dependence on renal dialysis: Secondary | ICD-10-CM | POA: Diagnosis not present

## 2021-05-17 DIAGNOSIS — N2581 Secondary hyperparathyroidism of renal origin: Secondary | ICD-10-CM | POA: Diagnosis not present

## 2021-05-17 DIAGNOSIS — D631 Anemia in chronic kidney disease: Secondary | ICD-10-CM | POA: Diagnosis not present

## 2021-05-17 DIAGNOSIS — N186 End stage renal disease: Secondary | ICD-10-CM | POA: Diagnosis not present

## 2021-05-19 DIAGNOSIS — N2581 Secondary hyperparathyroidism of renal origin: Secondary | ICD-10-CM | POA: Diagnosis not present

## 2021-05-19 DIAGNOSIS — N186 End stage renal disease: Secondary | ICD-10-CM | POA: Diagnosis not present

## 2021-05-19 DIAGNOSIS — D631 Anemia in chronic kidney disease: Secondary | ICD-10-CM | POA: Diagnosis not present

## 2021-05-19 DIAGNOSIS — Z992 Dependence on renal dialysis: Secondary | ICD-10-CM | POA: Diagnosis not present

## 2021-05-21 DIAGNOSIS — D631 Anemia in chronic kidney disease: Secondary | ICD-10-CM | POA: Diagnosis not present

## 2021-05-21 DIAGNOSIS — Z992 Dependence on renal dialysis: Secondary | ICD-10-CM | POA: Diagnosis not present

## 2021-05-21 DIAGNOSIS — N186 End stage renal disease: Secondary | ICD-10-CM | POA: Diagnosis not present

## 2021-05-21 DIAGNOSIS — N2581 Secondary hyperparathyroidism of renal origin: Secondary | ICD-10-CM | POA: Diagnosis not present

## 2021-05-24 DIAGNOSIS — T7840XA Allergy, unspecified, initial encounter: Secondary | ICD-10-CM | POA: Diagnosis not present

## 2021-05-24 DIAGNOSIS — N186 End stage renal disease: Secondary | ICD-10-CM | POA: Diagnosis not present

## 2021-05-24 DIAGNOSIS — I120 Hypertensive chronic kidney disease with stage 5 chronic kidney disease or end stage renal disease: Secondary | ICD-10-CM | POA: Diagnosis not present

## 2021-05-24 DIAGNOSIS — D631 Anemia in chronic kidney disease: Secondary | ICD-10-CM | POA: Diagnosis not present

## 2021-05-24 DIAGNOSIS — Z992 Dependence on renal dialysis: Secondary | ICD-10-CM | POA: Diagnosis not present

## 2021-05-24 DIAGNOSIS — N2581 Secondary hyperparathyroidism of renal origin: Secondary | ICD-10-CM | POA: Diagnosis not present

## 2021-05-26 DIAGNOSIS — N2581 Secondary hyperparathyroidism of renal origin: Secondary | ICD-10-CM | POA: Diagnosis not present

## 2021-05-26 DIAGNOSIS — N186 End stage renal disease: Secondary | ICD-10-CM | POA: Diagnosis not present

## 2021-05-26 DIAGNOSIS — D631 Anemia in chronic kidney disease: Secondary | ICD-10-CM | POA: Diagnosis not present

## 2021-05-26 DIAGNOSIS — T7840XA Allergy, unspecified, initial encounter: Secondary | ICD-10-CM | POA: Diagnosis not present

## 2021-05-26 DIAGNOSIS — Z992 Dependence on renal dialysis: Secondary | ICD-10-CM | POA: Diagnosis not present

## 2021-05-28 DIAGNOSIS — N186 End stage renal disease: Secondary | ICD-10-CM | POA: Diagnosis not present

## 2021-05-28 DIAGNOSIS — Z992 Dependence on renal dialysis: Secondary | ICD-10-CM | POA: Diagnosis not present

## 2021-05-28 DIAGNOSIS — T7840XA Allergy, unspecified, initial encounter: Secondary | ICD-10-CM | POA: Diagnosis not present

## 2021-05-28 DIAGNOSIS — N2581 Secondary hyperparathyroidism of renal origin: Secondary | ICD-10-CM | POA: Diagnosis not present

## 2021-05-28 DIAGNOSIS — D631 Anemia in chronic kidney disease: Secondary | ICD-10-CM | POA: Diagnosis not present

## 2021-05-31 DIAGNOSIS — D631 Anemia in chronic kidney disease: Secondary | ICD-10-CM | POA: Diagnosis not present

## 2021-05-31 DIAGNOSIS — N186 End stage renal disease: Secondary | ICD-10-CM | POA: Diagnosis not present

## 2021-05-31 DIAGNOSIS — Z992 Dependence on renal dialysis: Secondary | ICD-10-CM | POA: Diagnosis not present

## 2021-05-31 DIAGNOSIS — N2581 Secondary hyperparathyroidism of renal origin: Secondary | ICD-10-CM | POA: Diagnosis not present

## 2021-05-31 DIAGNOSIS — T7840XA Allergy, unspecified, initial encounter: Secondary | ICD-10-CM | POA: Diagnosis not present

## 2021-06-02 DIAGNOSIS — Z992 Dependence on renal dialysis: Secondary | ICD-10-CM | POA: Diagnosis not present

## 2021-06-02 DIAGNOSIS — N2581 Secondary hyperparathyroidism of renal origin: Secondary | ICD-10-CM | POA: Diagnosis not present

## 2021-06-02 DIAGNOSIS — D631 Anemia in chronic kidney disease: Secondary | ICD-10-CM | POA: Diagnosis not present

## 2021-06-02 DIAGNOSIS — T7840XA Allergy, unspecified, initial encounter: Secondary | ICD-10-CM | POA: Diagnosis not present

## 2021-06-02 DIAGNOSIS — N186 End stage renal disease: Secondary | ICD-10-CM | POA: Diagnosis not present

## 2021-06-04 DIAGNOSIS — T7840XA Allergy, unspecified, initial encounter: Secondary | ICD-10-CM | POA: Diagnosis not present

## 2021-06-04 DIAGNOSIS — Z992 Dependence on renal dialysis: Secondary | ICD-10-CM | POA: Diagnosis not present

## 2021-06-04 DIAGNOSIS — N186 End stage renal disease: Secondary | ICD-10-CM | POA: Diagnosis not present

## 2021-06-04 DIAGNOSIS — D631 Anemia in chronic kidney disease: Secondary | ICD-10-CM | POA: Diagnosis not present

## 2021-06-04 DIAGNOSIS — N2581 Secondary hyperparathyroidism of renal origin: Secondary | ICD-10-CM | POA: Diagnosis not present

## 2021-06-07 DIAGNOSIS — N186 End stage renal disease: Secondary | ICD-10-CM | POA: Diagnosis not present

## 2021-06-07 DIAGNOSIS — T7840XA Allergy, unspecified, initial encounter: Secondary | ICD-10-CM | POA: Diagnosis not present

## 2021-06-07 DIAGNOSIS — N2581 Secondary hyperparathyroidism of renal origin: Secondary | ICD-10-CM | POA: Diagnosis not present

## 2021-06-07 DIAGNOSIS — D631 Anemia in chronic kidney disease: Secondary | ICD-10-CM | POA: Diagnosis not present

## 2021-06-07 DIAGNOSIS — Z992 Dependence on renal dialysis: Secondary | ICD-10-CM | POA: Diagnosis not present

## 2021-06-09 DIAGNOSIS — N2581 Secondary hyperparathyroidism of renal origin: Secondary | ICD-10-CM | POA: Diagnosis not present

## 2021-06-09 DIAGNOSIS — N186 End stage renal disease: Secondary | ICD-10-CM | POA: Diagnosis not present

## 2021-06-09 DIAGNOSIS — D631 Anemia in chronic kidney disease: Secondary | ICD-10-CM | POA: Diagnosis not present

## 2021-06-09 DIAGNOSIS — T7840XA Allergy, unspecified, initial encounter: Secondary | ICD-10-CM | POA: Diagnosis not present

## 2021-06-09 DIAGNOSIS — Z992 Dependence on renal dialysis: Secondary | ICD-10-CM | POA: Diagnosis not present

## 2021-06-11 DIAGNOSIS — N2581 Secondary hyperparathyroidism of renal origin: Secondary | ICD-10-CM | POA: Diagnosis not present

## 2021-06-11 DIAGNOSIS — D631 Anemia in chronic kidney disease: Secondary | ICD-10-CM | POA: Diagnosis not present

## 2021-06-11 DIAGNOSIS — T7840XA Allergy, unspecified, initial encounter: Secondary | ICD-10-CM | POA: Diagnosis not present

## 2021-06-11 DIAGNOSIS — N186 End stage renal disease: Secondary | ICD-10-CM | POA: Diagnosis not present

## 2021-06-11 DIAGNOSIS — Z992 Dependence on renal dialysis: Secondary | ICD-10-CM | POA: Diagnosis not present

## 2021-06-14 DIAGNOSIS — N2581 Secondary hyperparathyroidism of renal origin: Secondary | ICD-10-CM | POA: Diagnosis not present

## 2021-06-14 DIAGNOSIS — N186 End stage renal disease: Secondary | ICD-10-CM | POA: Diagnosis not present

## 2021-06-14 DIAGNOSIS — D631 Anemia in chronic kidney disease: Secondary | ICD-10-CM | POA: Diagnosis not present

## 2021-06-14 DIAGNOSIS — Z992 Dependence on renal dialysis: Secondary | ICD-10-CM | POA: Diagnosis not present

## 2021-06-14 DIAGNOSIS — T7840XA Allergy, unspecified, initial encounter: Secondary | ICD-10-CM | POA: Diagnosis not present

## 2021-06-16 DIAGNOSIS — D631 Anemia in chronic kidney disease: Secondary | ICD-10-CM | POA: Diagnosis not present

## 2021-06-16 DIAGNOSIS — T7840XA Allergy, unspecified, initial encounter: Secondary | ICD-10-CM | POA: Diagnosis not present

## 2021-06-16 DIAGNOSIS — Z992 Dependence on renal dialysis: Secondary | ICD-10-CM | POA: Diagnosis not present

## 2021-06-16 DIAGNOSIS — N186 End stage renal disease: Secondary | ICD-10-CM | POA: Diagnosis not present

## 2021-06-16 DIAGNOSIS — N2581 Secondary hyperparathyroidism of renal origin: Secondary | ICD-10-CM | POA: Diagnosis not present

## 2021-06-18 DIAGNOSIS — Z992 Dependence on renal dialysis: Secondary | ICD-10-CM | POA: Diagnosis not present

## 2021-06-18 DIAGNOSIS — N186 End stage renal disease: Secondary | ICD-10-CM | POA: Diagnosis not present

## 2021-06-18 DIAGNOSIS — N2581 Secondary hyperparathyroidism of renal origin: Secondary | ICD-10-CM | POA: Diagnosis not present

## 2021-06-18 DIAGNOSIS — T7840XA Allergy, unspecified, initial encounter: Secondary | ICD-10-CM | POA: Diagnosis not present

## 2021-06-18 DIAGNOSIS — D631 Anemia in chronic kidney disease: Secondary | ICD-10-CM | POA: Diagnosis not present

## 2021-06-21 DIAGNOSIS — D631 Anemia in chronic kidney disease: Secondary | ICD-10-CM | POA: Diagnosis not present

## 2021-06-21 DIAGNOSIS — T7840XA Allergy, unspecified, initial encounter: Secondary | ICD-10-CM | POA: Diagnosis not present

## 2021-06-21 DIAGNOSIS — Z992 Dependence on renal dialysis: Secondary | ICD-10-CM | POA: Diagnosis not present

## 2021-06-21 DIAGNOSIS — N2581 Secondary hyperparathyroidism of renal origin: Secondary | ICD-10-CM | POA: Diagnosis not present

## 2021-06-21 DIAGNOSIS — N186 End stage renal disease: Secondary | ICD-10-CM | POA: Diagnosis not present

## 2021-06-23 DIAGNOSIS — N186 End stage renal disease: Secondary | ICD-10-CM | POA: Diagnosis not present

## 2021-06-23 DIAGNOSIS — T7840XA Allergy, unspecified, initial encounter: Secondary | ICD-10-CM | POA: Diagnosis not present

## 2021-06-23 DIAGNOSIS — Z992 Dependence on renal dialysis: Secondary | ICD-10-CM | POA: Diagnosis not present

## 2021-06-23 DIAGNOSIS — N2581 Secondary hyperparathyroidism of renal origin: Secondary | ICD-10-CM | POA: Diagnosis not present

## 2021-06-23 DIAGNOSIS — D631 Anemia in chronic kidney disease: Secondary | ICD-10-CM | POA: Diagnosis not present

## 2021-06-24 DIAGNOSIS — N186 End stage renal disease: Secondary | ICD-10-CM | POA: Diagnosis not present

## 2021-06-24 DIAGNOSIS — I120 Hypertensive chronic kidney disease with stage 5 chronic kidney disease or end stage renal disease: Secondary | ICD-10-CM | POA: Diagnosis not present

## 2021-06-24 DIAGNOSIS — Z992 Dependence on renal dialysis: Secondary | ICD-10-CM | POA: Diagnosis not present

## 2021-06-25 DIAGNOSIS — Z992 Dependence on renal dialysis: Secondary | ICD-10-CM | POA: Diagnosis not present

## 2021-06-25 DIAGNOSIS — N186 End stage renal disease: Secondary | ICD-10-CM | POA: Diagnosis not present

## 2021-06-25 DIAGNOSIS — N2581 Secondary hyperparathyroidism of renal origin: Secondary | ICD-10-CM | POA: Diagnosis not present

## 2021-06-25 DIAGNOSIS — D631 Anemia in chronic kidney disease: Secondary | ICD-10-CM | POA: Diagnosis not present

## 2021-06-25 DIAGNOSIS — Z23 Encounter for immunization: Secondary | ICD-10-CM | POA: Diagnosis not present

## 2021-06-28 DIAGNOSIS — N2581 Secondary hyperparathyroidism of renal origin: Secondary | ICD-10-CM | POA: Diagnosis not present

## 2021-06-28 DIAGNOSIS — Z23 Encounter for immunization: Secondary | ICD-10-CM | POA: Diagnosis not present

## 2021-06-28 DIAGNOSIS — D631 Anemia in chronic kidney disease: Secondary | ICD-10-CM | POA: Diagnosis not present

## 2021-06-28 DIAGNOSIS — Z992 Dependence on renal dialysis: Secondary | ICD-10-CM | POA: Diagnosis not present

## 2021-06-28 DIAGNOSIS — N186 End stage renal disease: Secondary | ICD-10-CM | POA: Diagnosis not present

## 2021-06-30 DIAGNOSIS — Z992 Dependence on renal dialysis: Secondary | ICD-10-CM | POA: Diagnosis not present

## 2021-06-30 DIAGNOSIS — Z23 Encounter for immunization: Secondary | ICD-10-CM | POA: Diagnosis not present

## 2021-06-30 DIAGNOSIS — D631 Anemia in chronic kidney disease: Secondary | ICD-10-CM | POA: Diagnosis not present

## 2021-06-30 DIAGNOSIS — N2581 Secondary hyperparathyroidism of renal origin: Secondary | ICD-10-CM | POA: Diagnosis not present

## 2021-06-30 DIAGNOSIS — N186 End stage renal disease: Secondary | ICD-10-CM | POA: Diagnosis not present

## 2021-07-02 DIAGNOSIS — D631 Anemia in chronic kidney disease: Secondary | ICD-10-CM | POA: Diagnosis not present

## 2021-07-02 DIAGNOSIS — N186 End stage renal disease: Secondary | ICD-10-CM | POA: Diagnosis not present

## 2021-07-02 DIAGNOSIS — N2581 Secondary hyperparathyroidism of renal origin: Secondary | ICD-10-CM | POA: Diagnosis not present

## 2021-07-02 DIAGNOSIS — Z992 Dependence on renal dialysis: Secondary | ICD-10-CM | POA: Diagnosis not present

## 2021-07-02 DIAGNOSIS — Z23 Encounter for immunization: Secondary | ICD-10-CM | POA: Diagnosis not present

## 2021-07-05 DIAGNOSIS — Z992 Dependence on renal dialysis: Secondary | ICD-10-CM | POA: Diagnosis not present

## 2021-07-05 DIAGNOSIS — Z23 Encounter for immunization: Secondary | ICD-10-CM | POA: Diagnosis not present

## 2021-07-05 DIAGNOSIS — N186 End stage renal disease: Secondary | ICD-10-CM | POA: Diagnosis not present

## 2021-07-05 DIAGNOSIS — D631 Anemia in chronic kidney disease: Secondary | ICD-10-CM | POA: Diagnosis not present

## 2021-07-05 DIAGNOSIS — N2581 Secondary hyperparathyroidism of renal origin: Secondary | ICD-10-CM | POA: Diagnosis not present

## 2021-07-07 DIAGNOSIS — Z23 Encounter for immunization: Secondary | ICD-10-CM | POA: Diagnosis not present

## 2021-07-07 DIAGNOSIS — Z992 Dependence on renal dialysis: Secondary | ICD-10-CM | POA: Diagnosis not present

## 2021-07-07 DIAGNOSIS — N2581 Secondary hyperparathyroidism of renal origin: Secondary | ICD-10-CM | POA: Diagnosis not present

## 2021-07-07 DIAGNOSIS — D631 Anemia in chronic kidney disease: Secondary | ICD-10-CM | POA: Diagnosis not present

## 2021-07-07 DIAGNOSIS — N186 End stage renal disease: Secondary | ICD-10-CM | POA: Diagnosis not present

## 2021-07-09 DIAGNOSIS — N2581 Secondary hyperparathyroidism of renal origin: Secondary | ICD-10-CM | POA: Diagnosis not present

## 2021-07-09 DIAGNOSIS — Z992 Dependence on renal dialysis: Secondary | ICD-10-CM | POA: Diagnosis not present

## 2021-07-09 DIAGNOSIS — Z23 Encounter for immunization: Secondary | ICD-10-CM | POA: Diagnosis not present

## 2021-07-09 DIAGNOSIS — D631 Anemia in chronic kidney disease: Secondary | ICD-10-CM | POA: Diagnosis not present

## 2021-07-09 DIAGNOSIS — N186 End stage renal disease: Secondary | ICD-10-CM | POA: Diagnosis not present

## 2021-07-12 DIAGNOSIS — D631 Anemia in chronic kidney disease: Secondary | ICD-10-CM | POA: Diagnosis not present

## 2021-07-12 DIAGNOSIS — N2581 Secondary hyperparathyroidism of renal origin: Secondary | ICD-10-CM | POA: Diagnosis not present

## 2021-07-12 DIAGNOSIS — Z23 Encounter for immunization: Secondary | ICD-10-CM | POA: Diagnosis not present

## 2021-07-12 DIAGNOSIS — Z992 Dependence on renal dialysis: Secondary | ICD-10-CM | POA: Diagnosis not present

## 2021-07-12 DIAGNOSIS — N186 End stage renal disease: Secondary | ICD-10-CM | POA: Diagnosis not present

## 2021-07-14 DIAGNOSIS — Z23 Encounter for immunization: Secondary | ICD-10-CM | POA: Diagnosis not present

## 2021-07-14 DIAGNOSIS — D631 Anemia in chronic kidney disease: Secondary | ICD-10-CM | POA: Diagnosis not present

## 2021-07-14 DIAGNOSIS — N2581 Secondary hyperparathyroidism of renal origin: Secondary | ICD-10-CM | POA: Diagnosis not present

## 2021-07-14 DIAGNOSIS — N186 End stage renal disease: Secondary | ICD-10-CM | POA: Diagnosis not present

## 2021-07-14 DIAGNOSIS — Z992 Dependence on renal dialysis: Secondary | ICD-10-CM | POA: Diagnosis not present

## 2021-07-16 DIAGNOSIS — N186 End stage renal disease: Secondary | ICD-10-CM | POA: Diagnosis not present

## 2021-07-16 DIAGNOSIS — D631 Anemia in chronic kidney disease: Secondary | ICD-10-CM | POA: Diagnosis not present

## 2021-07-16 DIAGNOSIS — Z23 Encounter for immunization: Secondary | ICD-10-CM | POA: Diagnosis not present

## 2021-07-16 DIAGNOSIS — N2581 Secondary hyperparathyroidism of renal origin: Secondary | ICD-10-CM | POA: Diagnosis not present

## 2021-07-16 DIAGNOSIS — Z992 Dependence on renal dialysis: Secondary | ICD-10-CM | POA: Diagnosis not present

## 2021-07-19 DIAGNOSIS — N2581 Secondary hyperparathyroidism of renal origin: Secondary | ICD-10-CM | POA: Diagnosis not present

## 2021-07-19 DIAGNOSIS — D631 Anemia in chronic kidney disease: Secondary | ICD-10-CM | POA: Diagnosis not present

## 2021-07-19 DIAGNOSIS — Z23 Encounter for immunization: Secondary | ICD-10-CM | POA: Diagnosis not present

## 2021-07-19 DIAGNOSIS — Z992 Dependence on renal dialysis: Secondary | ICD-10-CM | POA: Diagnosis not present

## 2021-07-19 DIAGNOSIS — N186 End stage renal disease: Secondary | ICD-10-CM | POA: Diagnosis not present

## 2021-07-21 DIAGNOSIS — D631 Anemia in chronic kidney disease: Secondary | ICD-10-CM | POA: Diagnosis not present

## 2021-07-21 DIAGNOSIS — Z992 Dependence on renal dialysis: Secondary | ICD-10-CM | POA: Diagnosis not present

## 2021-07-21 DIAGNOSIS — N2581 Secondary hyperparathyroidism of renal origin: Secondary | ICD-10-CM | POA: Diagnosis not present

## 2021-07-21 DIAGNOSIS — Z23 Encounter for immunization: Secondary | ICD-10-CM | POA: Diagnosis not present

## 2021-07-21 DIAGNOSIS — N186 End stage renal disease: Secondary | ICD-10-CM | POA: Diagnosis not present

## 2021-07-23 DIAGNOSIS — Z992 Dependence on renal dialysis: Secondary | ICD-10-CM | POA: Diagnosis not present

## 2021-07-23 DIAGNOSIS — N186 End stage renal disease: Secondary | ICD-10-CM | POA: Diagnosis not present

## 2021-07-23 DIAGNOSIS — D631 Anemia in chronic kidney disease: Secondary | ICD-10-CM | POA: Diagnosis not present

## 2021-07-23 DIAGNOSIS — Z23 Encounter for immunization: Secondary | ICD-10-CM | POA: Diagnosis not present

## 2021-07-23 DIAGNOSIS — N2581 Secondary hyperparathyroidism of renal origin: Secondary | ICD-10-CM | POA: Diagnosis not present

## 2021-07-24 DIAGNOSIS — I120 Hypertensive chronic kidney disease with stage 5 chronic kidney disease or end stage renal disease: Secondary | ICD-10-CM | POA: Diagnosis not present

## 2021-07-24 DIAGNOSIS — N186 End stage renal disease: Secondary | ICD-10-CM | POA: Diagnosis not present

## 2021-07-24 DIAGNOSIS — Z992 Dependence on renal dialysis: Secondary | ICD-10-CM | POA: Diagnosis not present

## 2021-07-26 DIAGNOSIS — N2581 Secondary hyperparathyroidism of renal origin: Secondary | ICD-10-CM | POA: Diagnosis not present

## 2021-07-26 DIAGNOSIS — Z992 Dependence on renal dialysis: Secondary | ICD-10-CM | POA: Diagnosis not present

## 2021-07-26 DIAGNOSIS — N186 End stage renal disease: Secondary | ICD-10-CM | POA: Diagnosis not present

## 2021-07-26 DIAGNOSIS — D631 Anemia in chronic kidney disease: Secondary | ICD-10-CM | POA: Diagnosis not present

## 2021-07-28 DIAGNOSIS — N186 End stage renal disease: Secondary | ICD-10-CM | POA: Diagnosis not present

## 2021-07-28 DIAGNOSIS — D631 Anemia in chronic kidney disease: Secondary | ICD-10-CM | POA: Diagnosis not present

## 2021-07-28 DIAGNOSIS — Z992 Dependence on renal dialysis: Secondary | ICD-10-CM | POA: Diagnosis not present

## 2021-07-28 DIAGNOSIS — N2581 Secondary hyperparathyroidism of renal origin: Secondary | ICD-10-CM | POA: Diagnosis not present

## 2021-07-30 DIAGNOSIS — D631 Anemia in chronic kidney disease: Secondary | ICD-10-CM | POA: Diagnosis not present

## 2021-07-30 DIAGNOSIS — N2581 Secondary hyperparathyroidism of renal origin: Secondary | ICD-10-CM | POA: Diagnosis not present

## 2021-07-30 DIAGNOSIS — N186 End stage renal disease: Secondary | ICD-10-CM | POA: Diagnosis not present

## 2021-07-30 DIAGNOSIS — Z992 Dependence on renal dialysis: Secondary | ICD-10-CM | POA: Diagnosis not present

## 2021-08-02 DIAGNOSIS — Z992 Dependence on renal dialysis: Secondary | ICD-10-CM | POA: Diagnosis not present

## 2021-08-02 DIAGNOSIS — N186 End stage renal disease: Secondary | ICD-10-CM | POA: Diagnosis not present

## 2021-08-02 DIAGNOSIS — N2581 Secondary hyperparathyroidism of renal origin: Secondary | ICD-10-CM | POA: Diagnosis not present

## 2021-08-02 DIAGNOSIS — D631 Anemia in chronic kidney disease: Secondary | ICD-10-CM | POA: Diagnosis not present

## 2021-08-04 DIAGNOSIS — D631 Anemia in chronic kidney disease: Secondary | ICD-10-CM | POA: Diagnosis not present

## 2021-08-04 DIAGNOSIS — N186 End stage renal disease: Secondary | ICD-10-CM | POA: Diagnosis not present

## 2021-08-04 DIAGNOSIS — Z992 Dependence on renal dialysis: Secondary | ICD-10-CM | POA: Diagnosis not present

## 2021-08-04 DIAGNOSIS — N2581 Secondary hyperparathyroidism of renal origin: Secondary | ICD-10-CM | POA: Diagnosis not present

## 2021-08-06 DIAGNOSIS — D631 Anemia in chronic kidney disease: Secondary | ICD-10-CM | POA: Diagnosis not present

## 2021-08-06 DIAGNOSIS — Z992 Dependence on renal dialysis: Secondary | ICD-10-CM | POA: Diagnosis not present

## 2021-08-06 DIAGNOSIS — N2581 Secondary hyperparathyroidism of renal origin: Secondary | ICD-10-CM | POA: Diagnosis not present

## 2021-08-06 DIAGNOSIS — N186 End stage renal disease: Secondary | ICD-10-CM | POA: Diagnosis not present

## 2021-08-09 DIAGNOSIS — N2581 Secondary hyperparathyroidism of renal origin: Secondary | ICD-10-CM | POA: Diagnosis not present

## 2021-08-09 DIAGNOSIS — D631 Anemia in chronic kidney disease: Secondary | ICD-10-CM | POA: Diagnosis not present

## 2021-08-09 DIAGNOSIS — N186 End stage renal disease: Secondary | ICD-10-CM | POA: Diagnosis not present

## 2021-08-09 DIAGNOSIS — Z992 Dependence on renal dialysis: Secondary | ICD-10-CM | POA: Diagnosis not present

## 2021-08-11 DIAGNOSIS — N2581 Secondary hyperparathyroidism of renal origin: Secondary | ICD-10-CM | POA: Diagnosis not present

## 2021-08-11 DIAGNOSIS — D631 Anemia in chronic kidney disease: Secondary | ICD-10-CM | POA: Diagnosis not present

## 2021-08-11 DIAGNOSIS — N186 End stage renal disease: Secondary | ICD-10-CM | POA: Diagnosis not present

## 2021-08-11 DIAGNOSIS — Z992 Dependence on renal dialysis: Secondary | ICD-10-CM | POA: Diagnosis not present

## 2021-08-13 DIAGNOSIS — N2581 Secondary hyperparathyroidism of renal origin: Secondary | ICD-10-CM | POA: Diagnosis not present

## 2021-08-13 DIAGNOSIS — Z992 Dependence on renal dialysis: Secondary | ICD-10-CM | POA: Diagnosis not present

## 2021-08-13 DIAGNOSIS — N186 End stage renal disease: Secondary | ICD-10-CM | POA: Diagnosis not present

## 2021-08-13 DIAGNOSIS — D631 Anemia in chronic kidney disease: Secondary | ICD-10-CM | POA: Diagnosis not present

## 2021-08-16 DIAGNOSIS — D631 Anemia in chronic kidney disease: Secondary | ICD-10-CM | POA: Diagnosis not present

## 2021-08-16 DIAGNOSIS — Z992 Dependence on renal dialysis: Secondary | ICD-10-CM | POA: Diagnosis not present

## 2021-08-16 DIAGNOSIS — N2581 Secondary hyperparathyroidism of renal origin: Secondary | ICD-10-CM | POA: Diagnosis not present

## 2021-08-16 DIAGNOSIS — N186 End stage renal disease: Secondary | ICD-10-CM | POA: Diagnosis not present

## 2021-08-18 DIAGNOSIS — Z992 Dependence on renal dialysis: Secondary | ICD-10-CM | POA: Diagnosis not present

## 2021-08-18 DIAGNOSIS — N2581 Secondary hyperparathyroidism of renal origin: Secondary | ICD-10-CM | POA: Diagnosis not present

## 2021-08-18 DIAGNOSIS — N186 End stage renal disease: Secondary | ICD-10-CM | POA: Diagnosis not present

## 2021-08-18 DIAGNOSIS — D631 Anemia in chronic kidney disease: Secondary | ICD-10-CM | POA: Diagnosis not present

## 2021-08-20 DIAGNOSIS — N2581 Secondary hyperparathyroidism of renal origin: Secondary | ICD-10-CM | POA: Diagnosis not present

## 2021-08-20 DIAGNOSIS — D631 Anemia in chronic kidney disease: Secondary | ICD-10-CM | POA: Diagnosis not present

## 2021-08-20 DIAGNOSIS — Z992 Dependence on renal dialysis: Secondary | ICD-10-CM | POA: Diagnosis not present

## 2021-08-20 DIAGNOSIS — N186 End stage renal disease: Secondary | ICD-10-CM | POA: Diagnosis not present

## 2021-08-23 DIAGNOSIS — N186 End stage renal disease: Secondary | ICD-10-CM | POA: Diagnosis not present

## 2021-08-23 DIAGNOSIS — D631 Anemia in chronic kidney disease: Secondary | ICD-10-CM | POA: Diagnosis not present

## 2021-08-23 DIAGNOSIS — N2581 Secondary hyperparathyroidism of renal origin: Secondary | ICD-10-CM | POA: Diagnosis not present

## 2021-08-23 DIAGNOSIS — Z992 Dependence on renal dialysis: Secondary | ICD-10-CM | POA: Diagnosis not present

## 2021-08-24 DIAGNOSIS — Z992 Dependence on renal dialysis: Secondary | ICD-10-CM | POA: Diagnosis not present

## 2021-08-24 DIAGNOSIS — N186 End stage renal disease: Secondary | ICD-10-CM | POA: Diagnosis not present

## 2021-08-24 DIAGNOSIS — I120 Hypertensive chronic kidney disease with stage 5 chronic kidney disease or end stage renal disease: Secondary | ICD-10-CM | POA: Diagnosis not present

## 2021-08-25 DIAGNOSIS — N2581 Secondary hyperparathyroidism of renal origin: Secondary | ICD-10-CM | POA: Diagnosis not present

## 2021-08-25 DIAGNOSIS — N186 End stage renal disease: Secondary | ICD-10-CM | POA: Diagnosis not present

## 2021-08-25 DIAGNOSIS — Z992 Dependence on renal dialysis: Secondary | ICD-10-CM | POA: Diagnosis not present

## 2021-08-25 DIAGNOSIS — D631 Anemia in chronic kidney disease: Secondary | ICD-10-CM | POA: Diagnosis not present

## 2021-08-27 DIAGNOSIS — D631 Anemia in chronic kidney disease: Secondary | ICD-10-CM | POA: Diagnosis not present

## 2021-08-27 DIAGNOSIS — Z992 Dependence on renal dialysis: Secondary | ICD-10-CM | POA: Diagnosis not present

## 2021-08-27 DIAGNOSIS — N186 End stage renal disease: Secondary | ICD-10-CM | POA: Diagnosis not present

## 2021-08-27 DIAGNOSIS — N2581 Secondary hyperparathyroidism of renal origin: Secondary | ICD-10-CM | POA: Diagnosis not present

## 2021-08-30 DIAGNOSIS — N186 End stage renal disease: Secondary | ICD-10-CM | POA: Diagnosis not present

## 2021-08-30 DIAGNOSIS — D631 Anemia in chronic kidney disease: Secondary | ICD-10-CM | POA: Diagnosis not present

## 2021-08-30 DIAGNOSIS — N2581 Secondary hyperparathyroidism of renal origin: Secondary | ICD-10-CM | POA: Diagnosis not present

## 2021-08-30 DIAGNOSIS — Z992 Dependence on renal dialysis: Secondary | ICD-10-CM | POA: Diagnosis not present

## 2021-09-01 DIAGNOSIS — D631 Anemia in chronic kidney disease: Secondary | ICD-10-CM | POA: Diagnosis not present

## 2021-09-01 DIAGNOSIS — N186 End stage renal disease: Secondary | ICD-10-CM | POA: Diagnosis not present

## 2021-09-01 DIAGNOSIS — Z992 Dependence on renal dialysis: Secondary | ICD-10-CM | POA: Diagnosis not present

## 2021-09-01 DIAGNOSIS — N2581 Secondary hyperparathyroidism of renal origin: Secondary | ICD-10-CM | POA: Diagnosis not present

## 2021-09-03 DIAGNOSIS — D631 Anemia in chronic kidney disease: Secondary | ICD-10-CM | POA: Diagnosis not present

## 2021-09-03 DIAGNOSIS — N186 End stage renal disease: Secondary | ICD-10-CM | POA: Diagnosis not present

## 2021-09-03 DIAGNOSIS — N2581 Secondary hyperparathyroidism of renal origin: Secondary | ICD-10-CM | POA: Diagnosis not present

## 2021-09-03 DIAGNOSIS — Z992 Dependence on renal dialysis: Secondary | ICD-10-CM | POA: Diagnosis not present

## 2021-09-06 DIAGNOSIS — Z992 Dependence on renal dialysis: Secondary | ICD-10-CM | POA: Diagnosis not present

## 2021-09-06 DIAGNOSIS — D631 Anemia in chronic kidney disease: Secondary | ICD-10-CM | POA: Diagnosis not present

## 2021-09-06 DIAGNOSIS — N186 End stage renal disease: Secondary | ICD-10-CM | POA: Diagnosis not present

## 2021-09-06 DIAGNOSIS — N2581 Secondary hyperparathyroidism of renal origin: Secondary | ICD-10-CM | POA: Diagnosis not present

## 2021-09-08 DIAGNOSIS — N2581 Secondary hyperparathyroidism of renal origin: Secondary | ICD-10-CM | POA: Diagnosis not present

## 2021-09-08 DIAGNOSIS — D631 Anemia in chronic kidney disease: Secondary | ICD-10-CM | POA: Diagnosis not present

## 2021-09-08 DIAGNOSIS — N186 End stage renal disease: Secondary | ICD-10-CM | POA: Diagnosis not present

## 2021-09-08 DIAGNOSIS — Z992 Dependence on renal dialysis: Secondary | ICD-10-CM | POA: Diagnosis not present

## 2021-09-10 DIAGNOSIS — D631 Anemia in chronic kidney disease: Secondary | ICD-10-CM | POA: Diagnosis not present

## 2021-09-10 DIAGNOSIS — Z992 Dependence on renal dialysis: Secondary | ICD-10-CM | POA: Diagnosis not present

## 2021-09-10 DIAGNOSIS — N2581 Secondary hyperparathyroidism of renal origin: Secondary | ICD-10-CM | POA: Diagnosis not present

## 2021-09-10 DIAGNOSIS — N186 End stage renal disease: Secondary | ICD-10-CM | POA: Diagnosis not present

## 2021-09-13 DIAGNOSIS — N2581 Secondary hyperparathyroidism of renal origin: Secondary | ICD-10-CM | POA: Diagnosis not present

## 2021-09-13 DIAGNOSIS — N186 End stage renal disease: Secondary | ICD-10-CM | POA: Diagnosis not present

## 2021-09-13 DIAGNOSIS — Z992 Dependence on renal dialysis: Secondary | ICD-10-CM | POA: Diagnosis not present

## 2021-09-13 DIAGNOSIS — D631 Anemia in chronic kidney disease: Secondary | ICD-10-CM | POA: Diagnosis not present

## 2021-09-15 DIAGNOSIS — N2581 Secondary hyperparathyroidism of renal origin: Secondary | ICD-10-CM | POA: Diagnosis not present

## 2021-09-15 DIAGNOSIS — D631 Anemia in chronic kidney disease: Secondary | ICD-10-CM | POA: Diagnosis not present

## 2021-09-15 DIAGNOSIS — Z992 Dependence on renal dialysis: Secondary | ICD-10-CM | POA: Diagnosis not present

## 2021-09-15 DIAGNOSIS — N186 End stage renal disease: Secondary | ICD-10-CM | POA: Diagnosis not present

## 2021-09-18 DIAGNOSIS — N186 End stage renal disease: Secondary | ICD-10-CM | POA: Diagnosis not present

## 2021-09-18 DIAGNOSIS — D631 Anemia in chronic kidney disease: Secondary | ICD-10-CM | POA: Diagnosis not present

## 2021-09-18 DIAGNOSIS — Z992 Dependence on renal dialysis: Secondary | ICD-10-CM | POA: Diagnosis not present

## 2021-09-18 DIAGNOSIS — N2581 Secondary hyperparathyroidism of renal origin: Secondary | ICD-10-CM | POA: Diagnosis not present

## 2021-09-20 DIAGNOSIS — Z992 Dependence on renal dialysis: Secondary | ICD-10-CM | POA: Diagnosis not present

## 2021-09-20 DIAGNOSIS — N186 End stage renal disease: Secondary | ICD-10-CM | POA: Diagnosis not present

## 2021-09-20 DIAGNOSIS — D631 Anemia in chronic kidney disease: Secondary | ICD-10-CM | POA: Diagnosis not present

## 2021-09-20 DIAGNOSIS — N2581 Secondary hyperparathyroidism of renal origin: Secondary | ICD-10-CM | POA: Diagnosis not present

## 2021-09-22 DIAGNOSIS — D631 Anemia in chronic kidney disease: Secondary | ICD-10-CM | POA: Diagnosis not present

## 2021-09-22 DIAGNOSIS — Z992 Dependence on renal dialysis: Secondary | ICD-10-CM | POA: Diagnosis not present

## 2021-09-22 DIAGNOSIS — N186 End stage renal disease: Secondary | ICD-10-CM | POA: Diagnosis not present

## 2021-09-22 DIAGNOSIS — N2581 Secondary hyperparathyroidism of renal origin: Secondary | ICD-10-CM | POA: Diagnosis not present

## 2021-09-23 DIAGNOSIS — N186 End stage renal disease: Secondary | ICD-10-CM | POA: Diagnosis not present

## 2021-09-23 DIAGNOSIS — Z992 Dependence on renal dialysis: Secondary | ICD-10-CM | POA: Diagnosis not present

## 2021-09-23 DIAGNOSIS — I120 Hypertensive chronic kidney disease with stage 5 chronic kidney disease or end stage renal disease: Secondary | ICD-10-CM | POA: Diagnosis not present

## 2021-09-24 DIAGNOSIS — N186 End stage renal disease: Secondary | ICD-10-CM | POA: Diagnosis not present

## 2021-09-24 DIAGNOSIS — N2581 Secondary hyperparathyroidism of renal origin: Secondary | ICD-10-CM | POA: Diagnosis not present

## 2021-09-24 DIAGNOSIS — Z992 Dependence on renal dialysis: Secondary | ICD-10-CM | POA: Diagnosis not present

## 2021-09-24 DIAGNOSIS — D631 Anemia in chronic kidney disease: Secondary | ICD-10-CM | POA: Diagnosis not present

## 2021-09-27 DIAGNOSIS — D631 Anemia in chronic kidney disease: Secondary | ICD-10-CM | POA: Diagnosis not present

## 2021-09-27 DIAGNOSIS — Z992 Dependence on renal dialysis: Secondary | ICD-10-CM | POA: Diagnosis not present

## 2021-09-27 DIAGNOSIS — N2581 Secondary hyperparathyroidism of renal origin: Secondary | ICD-10-CM | POA: Diagnosis not present

## 2021-09-27 DIAGNOSIS — N186 End stage renal disease: Secondary | ICD-10-CM | POA: Diagnosis not present

## 2021-09-29 DIAGNOSIS — Z992 Dependence on renal dialysis: Secondary | ICD-10-CM | POA: Diagnosis not present

## 2021-09-29 DIAGNOSIS — N186 End stage renal disease: Secondary | ICD-10-CM | POA: Diagnosis not present

## 2021-09-29 DIAGNOSIS — D631 Anemia in chronic kidney disease: Secondary | ICD-10-CM | POA: Diagnosis not present

## 2021-09-29 DIAGNOSIS — N2581 Secondary hyperparathyroidism of renal origin: Secondary | ICD-10-CM | POA: Diagnosis not present

## 2021-09-30 DIAGNOSIS — Z992 Dependence on renal dialysis: Secondary | ICD-10-CM | POA: Diagnosis not present

## 2021-09-30 DIAGNOSIS — N186 End stage renal disease: Secondary | ICD-10-CM | POA: Diagnosis not present

## 2021-09-30 DIAGNOSIS — I871 Compression of vein: Secondary | ICD-10-CM | POA: Diagnosis not present

## 2021-10-01 DIAGNOSIS — N2581 Secondary hyperparathyroidism of renal origin: Secondary | ICD-10-CM | POA: Diagnosis not present

## 2021-10-01 DIAGNOSIS — N186 End stage renal disease: Secondary | ICD-10-CM | POA: Diagnosis not present

## 2021-10-01 DIAGNOSIS — Z992 Dependence on renal dialysis: Secondary | ICD-10-CM | POA: Diagnosis not present

## 2021-10-01 DIAGNOSIS — D631 Anemia in chronic kidney disease: Secondary | ICD-10-CM | POA: Diagnosis not present

## 2021-10-04 DIAGNOSIS — Z992 Dependence on renal dialysis: Secondary | ICD-10-CM | POA: Diagnosis not present

## 2021-10-04 DIAGNOSIS — D631 Anemia in chronic kidney disease: Secondary | ICD-10-CM | POA: Diagnosis not present

## 2021-10-04 DIAGNOSIS — N2581 Secondary hyperparathyroidism of renal origin: Secondary | ICD-10-CM | POA: Diagnosis not present

## 2021-10-04 DIAGNOSIS — N186 End stage renal disease: Secondary | ICD-10-CM | POA: Diagnosis not present

## 2021-10-06 DIAGNOSIS — N186 End stage renal disease: Secondary | ICD-10-CM | POA: Diagnosis not present

## 2021-10-06 DIAGNOSIS — N2581 Secondary hyperparathyroidism of renal origin: Secondary | ICD-10-CM | POA: Diagnosis not present

## 2021-10-06 DIAGNOSIS — Z992 Dependence on renal dialysis: Secondary | ICD-10-CM | POA: Diagnosis not present

## 2021-10-06 DIAGNOSIS — D631 Anemia in chronic kidney disease: Secondary | ICD-10-CM | POA: Diagnosis not present

## 2021-10-08 DIAGNOSIS — N186 End stage renal disease: Secondary | ICD-10-CM | POA: Diagnosis not present

## 2021-10-08 DIAGNOSIS — N2581 Secondary hyperparathyroidism of renal origin: Secondary | ICD-10-CM | POA: Diagnosis not present

## 2021-10-08 DIAGNOSIS — Z992 Dependence on renal dialysis: Secondary | ICD-10-CM | POA: Diagnosis not present

## 2021-10-08 DIAGNOSIS — D631 Anemia in chronic kidney disease: Secondary | ICD-10-CM | POA: Diagnosis not present

## 2021-10-11 DIAGNOSIS — N186 End stage renal disease: Secondary | ICD-10-CM | POA: Diagnosis not present

## 2021-10-11 DIAGNOSIS — Z992 Dependence on renal dialysis: Secondary | ICD-10-CM | POA: Diagnosis not present

## 2021-10-11 DIAGNOSIS — D631 Anemia in chronic kidney disease: Secondary | ICD-10-CM | POA: Diagnosis not present

## 2021-10-11 DIAGNOSIS — N2581 Secondary hyperparathyroidism of renal origin: Secondary | ICD-10-CM | POA: Diagnosis not present

## 2021-10-13 DIAGNOSIS — N186 End stage renal disease: Secondary | ICD-10-CM | POA: Diagnosis not present

## 2021-10-13 DIAGNOSIS — D631 Anemia in chronic kidney disease: Secondary | ICD-10-CM | POA: Diagnosis not present

## 2021-10-13 DIAGNOSIS — N2581 Secondary hyperparathyroidism of renal origin: Secondary | ICD-10-CM | POA: Diagnosis not present

## 2021-10-13 DIAGNOSIS — Z992 Dependence on renal dialysis: Secondary | ICD-10-CM | POA: Diagnosis not present

## 2021-10-15 DIAGNOSIS — N186 End stage renal disease: Secondary | ICD-10-CM | POA: Diagnosis not present

## 2021-10-15 DIAGNOSIS — Z992 Dependence on renal dialysis: Secondary | ICD-10-CM | POA: Diagnosis not present

## 2021-10-15 DIAGNOSIS — N2581 Secondary hyperparathyroidism of renal origin: Secondary | ICD-10-CM | POA: Diagnosis not present

## 2021-10-15 DIAGNOSIS — D631 Anemia in chronic kidney disease: Secondary | ICD-10-CM | POA: Diagnosis not present

## 2021-10-18 DIAGNOSIS — N2581 Secondary hyperparathyroidism of renal origin: Secondary | ICD-10-CM | POA: Diagnosis not present

## 2021-10-18 DIAGNOSIS — N186 End stage renal disease: Secondary | ICD-10-CM | POA: Diagnosis not present

## 2021-10-18 DIAGNOSIS — D631 Anemia in chronic kidney disease: Secondary | ICD-10-CM | POA: Diagnosis not present

## 2021-10-18 DIAGNOSIS — Z992 Dependence on renal dialysis: Secondary | ICD-10-CM | POA: Diagnosis not present

## 2021-10-20 DIAGNOSIS — N2581 Secondary hyperparathyroidism of renal origin: Secondary | ICD-10-CM | POA: Diagnosis not present

## 2021-10-20 DIAGNOSIS — N186 End stage renal disease: Secondary | ICD-10-CM | POA: Diagnosis not present

## 2021-10-20 DIAGNOSIS — D631 Anemia in chronic kidney disease: Secondary | ICD-10-CM | POA: Diagnosis not present

## 2021-10-20 DIAGNOSIS — Z992 Dependence on renal dialysis: Secondary | ICD-10-CM | POA: Diagnosis not present

## 2021-10-22 DIAGNOSIS — N2581 Secondary hyperparathyroidism of renal origin: Secondary | ICD-10-CM | POA: Diagnosis not present

## 2021-10-22 DIAGNOSIS — D631 Anemia in chronic kidney disease: Secondary | ICD-10-CM | POA: Diagnosis not present

## 2021-10-22 DIAGNOSIS — Z992 Dependence on renal dialysis: Secondary | ICD-10-CM | POA: Diagnosis not present

## 2021-10-22 DIAGNOSIS — N186 End stage renal disease: Secondary | ICD-10-CM | POA: Diagnosis not present

## 2022-06-01 ENCOUNTER — Encounter (INDEPENDENT_AMBULATORY_CARE_PROVIDER_SITE_OTHER): Payer: Self-pay

## 2023-03-27 ENCOUNTER — Other Ambulatory Visit (HOSPITAL_COMMUNITY): Payer: Self-pay | Admitting: Internal Medicine

## 2023-03-27 DIAGNOSIS — Z992 Dependence on renal dialysis: Secondary | ICD-10-CM

## 2023-03-27 DIAGNOSIS — N186 End stage renal disease: Secondary | ICD-10-CM

## 2023-04-05 ENCOUNTER — Other Ambulatory Visit: Payer: Self-pay | Admitting: Radiology

## 2023-04-06 ENCOUNTER — Other Ambulatory Visit: Payer: Self-pay

## 2023-04-06 ENCOUNTER — Other Ambulatory Visit (HOSPITAL_COMMUNITY): Payer: Self-pay | Admitting: Internal Medicine

## 2023-04-06 ENCOUNTER — Encounter (HOSPITAL_COMMUNITY): Payer: Self-pay

## 2023-04-06 ENCOUNTER — Ambulatory Visit (HOSPITAL_COMMUNITY)
Admission: RE | Admit: 2023-04-06 | Discharge: 2023-04-06 | Disposition: A | Discharge status: Home / Self Care | Payer: Medicare PPO | Source: Ambulatory Visit | Attending: Internal Medicine | Admitting: Internal Medicine

## 2023-04-06 DIAGNOSIS — F17290 Nicotine dependence, other tobacco product, uncomplicated: Secondary | ICD-10-CM | POA: Diagnosis not present

## 2023-04-06 DIAGNOSIS — I120 Hypertensive chronic kidney disease with stage 5 chronic kidney disease or end stage renal disease: Secondary | ICD-10-CM | POA: Insufficient documentation

## 2023-04-06 DIAGNOSIS — N186 End stage renal disease: Secondary | ICD-10-CM | POA: Insufficient documentation

## 2023-04-06 DIAGNOSIS — T82898A Other specified complication of vascular prosthetic devices, implants and grafts, initial encounter: Secondary | ICD-10-CM | POA: Insufficient documentation

## 2023-04-06 DIAGNOSIS — Y841 Kidney dialysis as the cause of abnormal reaction of the patient, or of later complication, without mention of misadventure at the time of the procedure: Secondary | ICD-10-CM | POA: Diagnosis not present

## 2023-04-06 DIAGNOSIS — Z992 Dependence on renal dialysis: Secondary | ICD-10-CM | POA: Insufficient documentation

## 2023-04-06 HISTORY — PX: IR US GUIDE VASC ACCESS LEFT: IMG2389

## 2023-04-06 HISTORY — PX: IR DIALY SHUNT INTRO NEEDLE/INTRACATH INITIAL W/IMG LEFT: IMG6102

## 2023-04-06 MED ORDER — FENTANYL CITRATE (PF) 100 MCG/2ML IJ SOLN
INTRAMUSCULAR | Status: AC
  Filled 2023-04-06: qty 2

## 2023-04-06 MED ORDER — LIDOCAINE HCL 1 % IJ SOLN
INTRAMUSCULAR | Status: AC
  Filled 2023-04-06: qty 20

## 2023-04-06 MED ORDER — MIDAZOLAM HCL 2 MG/2ML IJ SOLN
INTRAMUSCULAR | Status: AC
  Filled 2023-04-06: qty 2

## 2023-04-06 MED ORDER — FENTANYL CITRATE (PF) 100 MCG/2ML IJ SOLN
INTRAMUSCULAR | Status: AC | PRN
  Administered 2023-04-06: 25 ug via INTRAVENOUS

## 2023-04-06 MED ORDER — MIDAZOLAM HCL 2 MG/2ML IJ SOLN
INTRAMUSCULAR | Status: AC | PRN
  Administered 2023-04-06: 1 mg via INTRAVENOUS
  Administered 2023-04-06: .5 mg via INTRAVENOUS
  Administered 2023-04-06: 1 mg via INTRAVENOUS

## 2023-04-06 MED ORDER — IOHEXOL 300 MG/ML  SOLN
100.0000 mL | Freq: Once | INTRAMUSCULAR | Status: AC | PRN
  Administered 2023-04-06: 52 mL via INTRA_ARTERIAL

## 2023-04-06 NOTE — Progress Notes (Signed)
Left arm fistula with good bruit and thrill

## 2023-04-06 NOTE — H&P (Signed)
Chief Complaint: Patient was seen in consultation today for left upper arm dialysis fistula evaluation and possible intervention at the request of [name]  Referring Physician(s): [name]  Supervising Physician: [name]  Patient Status: [facility] - Out-pt  History of Present Illness: [name] is Winters 52 y.o. male   Full Code status per pt  ESRD Left upper arm dialysis fistula placed 2014 Basilic vein transposition 2018 Last use yesterday--- complete Slow flow per chart Pt says some does runs well; some less well  Request made for evaluation and possible intervention   Past Medical History:  Diagnosis Date   Anemia    Iron deficiency   Aneurysm (HCC) 02/15/2007   Right arm large aneurysm   Constipation    End stage renal disease on dialysis (HCC)    M- W-F   GERD (gastroesophageal reflux disease)    Hemodialysis finding May 2 ,2005   First HD at [facility].   History of bronchitis    Hypertension    Sickle cell trait [facility])     Past Surgical History:  Procedure Laterality Date   AV FISTULA PLACEMENT     AV FISTULA PLACEMENT Left 05/03/2013   Procedure: ARTERIOVENOUS (AV) FISTULA CREATION;  Surgeon: Tim Earthly, MD;  Location: [facility] OR;  Service: Vascular;  Laterality: Left;   BASCILIC VEIN TRANSPOSITION Left 11/28/2016   Procedure: BASCILIC VEIN TRANSPOSITION- LEFT ARM;  Surgeon: Tim Earthly, MD;  Location: [facility] OR;  Service: Vascular;  Laterality: Left;   COLONOSCOPY     Hxl: of   colonscopy     FISTULA SUPERFICIALIZATION Left 07/24/2013   Procedure: FISTULA SUPERFICIALIZATION AND LIGATION OF BRANCHES;  Surgeon: Tim Earthly, MD;  Location: [facility] OR;  Service: Vascular;  Laterality: Left;   FRACTURE SURGERY Left Jul 28, 2015   Petela Tendon and Fx   INSERTION OF DIALYSIS CATHETER Right 04/22/2013   Procedure: INSERTION OF DIALYSIS CATHETER;  Surgeon: Tim Earthly, MD;  Location: [facility] OR;  Service: Vascular;  Laterality: Right;   LIGATION OF  ARTERIOVENOUS  FISTULA Right 04/22/2013   Procedure: LIGATION OF ARTERIOVENOUS  FISTULA  AND RESECTION OF VENOUS ANEURYSM AND ABSCESS;  Surgeon: Tim Earthly, MD;  Location: [facility] OR;  Service: Vascular;  Laterality: Right;   PARATHYROIDECTOMY N/Winters 02/12/2016   Procedure:  TOTAL PARATHYROIDECTOMY AUTOTRANSPLANT TO RIGHT FOREARM ;  Surgeon: Tim Level, MD;  Location: [facility] OR;  Service: General;  Laterality: N/Winters;   QUADRICEPS TENDON REPAIR Left 08/24/2015   Procedure: REPAIR OF LEFT QUADRICEP TENDON;  Surgeon: Tim Birchwood, MD;  Location: [facility] OR;  Service: Orthopedics;  Laterality: Left;   REMOVAL OF Winters DIALYSIS CATHETER  2014   TEE WITHOUT CARDIOVERSION N/Winters 09/13/2013   Procedure: TRANSESOPHAGEAL ECHOCARDIOGRAM (TEE);  Surgeon: Tim Fair, MD;  Location: [facility] ENDOSCOPY;  Service: Cardiovascular;  Laterality: N/Winters;    Allergies: Tape and Chlorhexidine  Medications: Prior to Admission medications   Medication Sig Start Date End Date Taking? Authorizing Provider  acetaminophen (TYLENOL) 500 MG tablet Take 1,000 mg by mouth every 8 (eight) hours as needed (pain or headaches).    Yes [provider]  b complex-vitamin c-folic acid (NEPHRO-VITE) 0.8 MG TABS Take 1 tablet by mouth daily.    Yes [provider]  midodrine (PROAMATINE) 10 MG tablet Take 10 mg by mouth See admin instructions. Take 1 tablet (10 mg) by mouth prior to dialysis and 1 tablet (10 mg) half way through dialysis - Monday, Wednesday,  Friday   Yes [provider]  Multiple Vitamins-Minerals (RENAPLEX-D) TABS Take 1 tablet by mouth daily. 07/18/18  Yes [provider]  sevelamer (RENAGEL) 800 MG tablet Take 1,600-2,400 mg by mouth 3 (three) times daily with meals.   Yes [provider]  calcium elemental as carbonate (BARIATRIC TUMS ULTRA) 400 MG chewable tablet Chew 1,000 mg by mouth 3 (three) times daily after meals.     [provider]  omeprazole (PRILOSEC) 40 MG capsule Take 1 capsule  (40 mg total) by mouth daily. 10/08/18   Tim Handy, PA-C  oxyCODONE-acetaminophen (PERCOCET/ROXICET) 5-325 MG tablet Take 1 tablet by mouth every 6 (six) hours as needed. Patient not taking: Reported on 08/03/2018 11/28/16   Tim Gurney, PA-C     Family History  Problem Relation Age of Onset   Hypertension Mother    Heart disease Mother        before age 66   Other Brother        DVT    Social History   Socioeconomic History   Marital status: Single    Spouse name: Not on file   Number of children: Not on file   Years of education: Not on file   Highest education Winters: Not on file  Occupational History   Not on file  Tobacco Use   Smoking status: Light Smoker    Types: Cigars   Smokeless tobacco: Never   Tobacco comments:    pt states he smokes about 2 cigars per year  Substance and Sexual Activity   Alcohol use: Yes    Comment: rarely   Drug use: No   Sexual activity: Yes  Other Topics Concern   Not on file  Social History Narrative   Not on file   Social Determinants of Health   Financial Resource Strain: Not on file  Food Insecurity: Not on file  Transportation Needs: Not on file  Physical Activity: Not on file  Stress: Not on file  Social Connections: Not on file    Review of Systems: Winters 12 point ROS discussed and pertinent positives are indicated in the HPI above.  All other systems are negative.  Review of Systems  Constitutional:  Negative for activity change, fatigue and fever.  Respiratory:  Negative for cough and shortness of breath.   Cardiovascular:  Negative for chest pain.  Gastrointestinal:  Negative for abdominal pain.  Neurological:  Negative for weakness.  Psychiatric/Behavioral:  Negative for behavioral problems and confusion.     Vital Signs: BP 115/67   Pulse 85   Temp 98.3 F (36.8 C) (Temporal)   Resp 20   Ht 5\' 11"  (1.803 m)   Wt (!) 330 lb (149.7 kg)   SpO2 100%   BMI 46.03 kg/m     Physical Exam Vitals  reviewed.  HENT:     Mouth/Throat:     Mouth: Mucous membranes are moist.  Cardiovascular:     Rate and Rhythm: Normal rate and regular rhythm.     Heart sounds: Normal heart sounds.  Pulmonary:     Effort: Pulmonary effort is normal.     Breath sounds: Normal breath sounds.  Abdominal:     Palpations: Abdomen is soft.     Tenderness: There is no abdominal tenderness.  Musculoskeletal:        General: Normal range of motion.     Comments: Left arm fistula Good pulse Good thrill   Skin:    General: Skin is  warm.  Neurological:     Mental Status: He is alert and oriented to person, place, and time.  Psychiatric:        Behavior: Behavior normal.     Imaging: No results found.  Labs:  CBC: No results for input(s): "WBC", "HGB", "HCT", "PLT" in the last 8760 hours.  COAGS: No results for input(s): "INR", "APTT" in the last 8760 hours.  BMP: No results for input(s): "NA", "K", "CL", "CO2", "GLUCOSE", "BUN", "CALCIUM", "CREATININE", "GFRNONAA", "GFRAA" in the last 8760 hours.  Invalid input(s): "CMP"  LIVER FUNCTION TESTS: No results for input(s): "BILITOT", "AST", "ALT", "ALKPHOS", "PROT", "ALBUMIN" in the last 8760 hours.  TUMOR MARKERS: No results for input(s): "AFPTM", "CEA", "CA199", "CHROMGRNA" in the last 8760 hours.  Assessment and Plan:  Scheduled for left arm fistula evaluation/possible intervention Possible tunneled dialysis catheter placement if needed Risks and benefits discussed with the patient including, but not limited to bleeding, infection, vascular injury, pulmonary embolism, need for tunneled HD catheter placement or even death.  All of the patient's questions were answered, patient is agreeable to proceed. Consent signed and in chart.  Thank you for this interesting consult.  I greatly enjoyed meeting [name] and look forward to participating in their care.  Winters copy of this report was sent to the requesting provider on this  date.  Electronically Signed: Robet Leu, PA-C 04/06/2023, 8:39 AM   I spent Winters total of  30 Minutes   in face to face in clinical consultation, greater than 50% of which was counseling/coordinating care for left arm dialysis fistula eval/poss intervention

## 2023-04-06 NOTE — Procedures (Signed)
Interventional Radiology Procedure:   Indications: Abnormal flows  Procedure: Left upper extremity fistulogram  Findings: Left upper arm fistula is patent.  The vein at the anastomosis is aneurysmal and there is narrowing of the vein just beyond the aneurysm.  This narrowing appears to be related to the configuration of the aneurysm and compression rather than venous stenosis.    Complications: None     EBL: Minimal  Plan: Need to remove suture tomorrow at dialysis.  If persistent problems with fistula, recommend vascular surgery consultation.    Tramel Westbrook R. Lowella Dandy, MD  Pager: [phone]

## 2023-04-06 NOTE — Sedation Documentation (Signed)
Pt feeling anxious and asking to reschedule. MD [facility] aware, finishing other procedure. Pt offered to sit up and recover, then re-prep for procedure. Pt agreed.

## 2023-04-06 NOTE — Progress Notes (Signed)
Patient was given discharge instructions. He verbalized understanding. 

## 2023-04-19 ENCOUNTER — Other Ambulatory Visit (HOSPITAL_COMMUNITY): Payer: Self-pay | Admitting: Surgery

## 2023-04-19 DIAGNOSIS — E213 Hyperparathyroidism, unspecified: Secondary | ICD-10-CM

## 2023-05-11 ENCOUNTER — Other Ambulatory Visit (HOSPITAL_COMMUNITY): Payer: Self-pay | Admitting: Surgery

## 2023-05-11 ENCOUNTER — Encounter (HOSPITAL_COMMUNITY)
Admission: RE | Admit: 2023-05-11 | Discharge: 2023-05-11 | Disposition: A | Discharge status: Home / Self Care | Payer: Medicare PPO | Source: Ambulatory Visit | Attending: Surgery | Admitting: Surgery

## 2023-05-11 DIAGNOSIS — E213 Hyperparathyroidism, unspecified: Secondary | ICD-10-CM

## 2023-05-11 MED ORDER — TECHNETIUM TC 99M SESTAMIBI - CARDIOLITE
26.1000 | Freq: Once | INTRAVENOUS | Status: AC | PRN
  Administered 2023-05-11: 26.1 via INTRAVENOUS

## 2023-05-24 NOTE — Progress Notes (Signed)
Sestamibi scan shows most of the activity to be in the forearm as expected.  However, there is a small amount of activity in the neck adjacent to the thyroid gland.  I would like to get a 4D-CT scan of the neck to make sure there is no evidence of residual parathyroid tissue.  Tresa Endo - please schedule a 4D-CT scan of the neck to evaluate for possible residual parathyroid tissue following total parathyroidectomy.  tmg  [name], MD [facility] practice Office: [phone]

## 2023-05-30 ENCOUNTER — Other Ambulatory Visit (HOSPITAL_COMMUNITY): Payer: Self-pay | Admitting: Surgery

## 2023-05-30 DIAGNOSIS — N2581 Secondary hyperparathyroidism of renal origin: Secondary | ICD-10-CM

## 2023-06-08 ENCOUNTER — Ambulatory Visit (HOSPITAL_COMMUNITY)
Admission: RE | Admit: 2023-06-08 | Discharge: 2023-06-08 | Disposition: A | Discharge status: Home / Self Care | Payer: Medicare PPO | Source: Ambulatory Visit | Attending: Surgery | Admitting: Surgery

## 2023-06-08 DIAGNOSIS — N2581 Secondary hyperparathyroidism of renal origin: Secondary | ICD-10-CM | POA: Diagnosis not present

## 2023-06-08 MED ORDER — IOHEXOL 350 MG/ML SOLN
75.0000 mL | Freq: Once | INTRAVENOUS | Status: AC | PRN
  Administered 2023-06-08: 75 mL via INTRAVENOUS

## 2023-07-04 NOTE — Progress Notes (Signed)
Nuclear scan shows some activity in the neck, and there is an 18mm indeterminate lesion at this location.  Could be residual parathyroid tissue.  Tresa Endo - please arrange follow up office visit to discuss findings and decide on a surgical strategy with this patient.  tmg  [name], MD [facility] practice Office: [phone]

## 2023-07-13 ENCOUNTER — Ambulatory Visit: Payer: Self-pay | Admitting: Surgery

## 2023-10-05 NOTE — Progress Notes (Signed)
Surgical Instructions   Your procedure is scheduled on Thursday, December 19th, 2024. Report to [facility] Main Entrance "A" at 5:30 A.M., then check in with the Admitting office. Any questions or running late day of surgery: call [phone]  Questions prior to your surgery date: call [phone], Monday-Friday, 8am-4pm. If you experience any cold or flu symptoms such as cough, fever, chills, shortness of breath, etc. between now and your scheduled surgery, please notify us at the above number.     Remember:  Do not eat after midnight the night before your surgery   You may drink clear liquids until 4:30 the morning of your surgery.   Clear liquids allowed are: Water, Non-Citrus Juices (without pulp), Carbonated Beverages, Clear Tea (no milk, honey, etc.), Black Coffee Only (NO MILK, CREAM OR POWDERED CREAMER of any kind), and Gatorade.    Take these medicines the morning of surgery with A SIP OF WATER: Omeprazole (Prilosec)   May take these medicines IF NEEDED:    One week prior to surgery, STOP taking any Aspirin (unless otherwise instructed by your surgeon) Aleve, Naproxen, Ibuprofen, Motrin, Advil, Goody's, BC's, all herbal medications, fish oil, and non-prescription vitamins.                     Do NOT Smoke (Tobacco/Vaping) for 24 hours prior to your procedure.  If you use a CPAP at night, you may bring your mask/headgear for your overnight stay.   You will be asked to remove any contacts, glasses, piercing's, hearing aid's, dentures/partials prior to surgery. Please bring cases for these items if needed.    Patients discharged the day of surgery will not be allowed to drive home, and someone needs to stay with them for 24 hours.  SURGICAL WAITING ROOM VISITATION Patients may have no more than 2 support people in the waiting area - these visitors may rotate.   Pre-op nurse will coordinate an appropriate time for 1 ADULT support person, who may not rotate, to accompany  patient in pre-op.  Children under the age of 59 must have an adult with them who is not the patient and must remain in the main waiting area with an adult.  If the patient needs to stay at the hospital during part of their recovery, the visitor guidelines for inpatient rooms apply.  Please refer to the [facility] website for the visitor guidelines for any additional information.   If you received a COVID test during your pre-op visit  it is requested that you wear a mask when out in public, stay away from anyone that may not be feeling well and notify your surgeon if you develop symptoms. If you have been in contact with anyone that has tested positive in the last 10 days please notify you surgeon.      Pre-operative CHG Bathing Instructions   You can play a key role in reducing the risk of infection after surgery. Your skin needs to be as free of germs as possible. You can reduce the number of germs on your skin by washing with CHG (chlorhexidine gluconate) soap before surgery. CHG is an antiseptic soap that kills germs and continues to kill germs even after washing.   DO NOT use if you have an allergy to chlorhexidine/CHG or antibacterial soaps. If your skin becomes reddened or irritated, stop using the CHG and notify one of our RNs at [phone].              TAKE A  SHOWER THE NIGHT BEFORE SURGERY AND THE DAY OF SURGERY    Please keep in mind the following:  DO NOT shave, including legs and underarms, 48 hours prior to surgery.   You may shave your face before/day of surgery.  Place clean sheets on your bed the night before surgery Use a clean washcloth (not used since being washed) for each shower. DO NOT sleep with pet's night before surgery.  CHG Shower Instructions:  Wash your face and private area with normal soap. If you choose to wash your hair, wash first with your normal shampoo.  After you use shampoo/soap, rinse your hair and body thoroughly to remove shampoo/soap  residue.  Turn the water OFF and apply half the bottle of CHG soap to a CLEAN washcloth.  Apply CHG soap ONLY FROM YOUR NECK DOWN TO YOUR TOES (washing for 3-5 minutes)  DO NOT use CHG soap on face, private areas, open wounds, or sores.  Pay special attention to the area where your surgery is being performed.  If you are having back surgery, having someone wash your back for you may be helpful. Wait 2 minutes after CHG soap is applied, then you may rinse off the CHG soap.  Pat dry with a clean towel  Put on clean pajamas    Additional instructions for the day of surgery: DO NOT APPLY any lotions, deodorants, cologne, or perfumes.   Do not wear jewelry or makeup Do not wear nail polish, gel polish, artificial nails, or any other type of covering on natural nails (fingers and toes) Do not bring valuables to the hospital. [facility] is not responsible for valuables/personal belongings. Put on clean/comfortable clothes.  Please brush your teeth.  Ask your nurse before applying any prescription medications to the skin.

## 2023-10-06 ENCOUNTER — Encounter (HOSPITAL_COMMUNITY): Payer: Self-pay

## 2023-10-06 ENCOUNTER — Other Ambulatory Visit: Payer: Self-pay

## 2023-10-06 ENCOUNTER — Encounter (HOSPITAL_COMMUNITY)
Admission: RE | Admit: 2023-10-06 | Discharge: 2023-10-06 | Disposition: A | Discharge status: Home / Self Care | Payer: Medicare PPO | Source: Ambulatory Visit | Attending: Surgery | Admitting: Surgery

## 2023-10-06 DIAGNOSIS — K219 Gastro-esophageal reflux disease without esophagitis: Secondary | ICD-10-CM | POA: Diagnosis not present

## 2023-10-06 DIAGNOSIS — N186 End stage renal disease: Secondary | ICD-10-CM | POA: Diagnosis not present

## 2023-10-06 DIAGNOSIS — Z992 Dependence on renal dialysis: Secondary | ICD-10-CM | POA: Diagnosis not present

## 2023-10-06 DIAGNOSIS — E669 Obesity, unspecified: Secondary | ICD-10-CM | POA: Diagnosis not present

## 2023-10-06 DIAGNOSIS — N2581 Secondary hyperparathyroidism of renal origin: Secondary | ICD-10-CM | POA: Insufficient documentation

## 2023-10-06 DIAGNOSIS — Z6841 Body Mass Index (BMI) 40.0 and over, adult: Secondary | ICD-10-CM | POA: Diagnosis not present

## 2023-10-06 DIAGNOSIS — D573 Sickle-cell trait: Secondary | ICD-10-CM | POA: Diagnosis not present

## 2023-10-06 DIAGNOSIS — Z01818 Encounter for other preprocedural examination: Secondary | ICD-10-CM | POA: Diagnosis present

## 2023-10-06 DIAGNOSIS — I120 Hypertensive chronic kidney disease with stage 5 chronic kidney disease or end stage renal disease: Secondary | ICD-10-CM | POA: Insufficient documentation

## 2023-10-06 LAB — CBC
HCT: 41.5 % (ref 39.0–52.0)
Hemoglobin: 14.4 g/dL (ref 13.0–17.0)
MCH: 29.6 pg (ref 26.0–34.0)
MCHC: 34.7 g/dL (ref 30.0–36.0)
MCV: 85.4 fL (ref 80.0–100.0)
Platelets: 216 10*3/uL (ref 150–400)
RBC: 4.86 MIL/uL (ref 4.22–5.81)
RDW: 12.9 % (ref 11.5–15.5)
WBC: 6.1 10*3/uL (ref 4.0–10.5)
nRBC: 0 % (ref 0.0–0.2)

## 2023-10-06 LAB — BASIC METABOLIC PANEL
Anion gap: 15 (ref 5–15)
BUN: 14 mg/dL (ref 6–20)
CO2: 30 mmol/L (ref 22–32)
Calcium: 9.1 mg/dL (ref 8.9–10.3)
Chloride: 89 mmol/L — ABNORMAL LOW (ref 98–111)
Creatinine, Ser: 6.29 mg/dL — ABNORMAL HIGH (ref 0.61–1.24)
GFR, Estimated: 10 mL/min — ABNORMAL LOW (ref >60)
Glucose, Bld: 88 mg/dL (ref 70–99)
Potassium: 3.9 mmol/L (ref 3.5–5.1)
Sodium: 134 mmol/L — ABNORMAL LOW (ref 135–145)

## 2023-10-06 NOTE — Progress Notes (Signed)
PCP - Dr. Allena Katz Cardiologist - denies  PPM/ICD - denies Device Orders - n/a Rep Notified - n/a  Chest x-ray - denies EKG - 10-06-23 Stress Test - denies ECHO - 2014 Cardiac Cath - denies  Sleep Study - per patient had one in June or July 2024 everything ok per patient CPAP - n/a  DM denies  Blood Thinner Instructions:denies Aspirin Instructions:n/a  ERAS Protcol - clear liquids until 4:30   COVID TEST- n/a   Anesthesia review: yes EKG reading and per patient bp began low at dialysis on Monday  10-02-23. Dialysis M-W-F  Patient denies shortness of breath, fever, cough and chest pain at PAT appointment   All instructions explained to the patient, with a verbal understanding of the material. Patient agrees to go over the instructions while at home for a better understanding. Patient also instructed to self quarantine after being tested for COVID-19. The opportunity to ask questions was provided.

## 2023-10-06 NOTE — Progress Notes (Signed)
Surgical Instructions     Your procedure is scheduled on Thursday, December 19th, 2024. Report to [facility] Main Entrance "A" at 5:30 A.M., then check in with the Admitting office. Any questions or running late day of surgery: call [phone]   Questions prior to your surgery date: call [phone], Monday-Friday, 8am-4pm. If you experience any cold or flu symptoms such as cough, fever, chills, shortness of breath, etc. between now and your scheduled surgery, please notify us at the above number.            Remember:       Do not eat after midnight the night before your surgery     You may drink clear liquids until 4:30 the morning of your surgery.   Clear liquids allowed are: Water, Non-Citrus Juices (without pulp), Carbonated Beverages, Clear Tea (no milk, honey, etc.), Black Coffee Only (NO MILK, CREAM OR POWDERED CREAMER of any kind), and Gatorade.          Take these medicines the morning of surgery with A SIP OF WATER:      May take these medicines IF NEEDED:  acetaminophen (TYLENOL)   cetirizine (ZYRTEC)  Famotidine (ZANTAC 360 PO)  fluticasone (FLONASE)   One week prior to surgery, STOP taking any Aspirin (unless otherwise instructed by your surgeon)  Aleve, Naproxen, Ibuprofen, Motrin, Advil, Goody's, BC's, all herbal medications, fish oil, and non-prescription vitamins.                     Do NOT Smoke (Tobacco/Vaping) for 24 hours prior to your procedure.   If you use a CPAP at night, you may bring your mask/headgear for your overnight stay.   You will be asked to remove any contacts, glasses, piercing's, hearing aid's, dentures/partials prior to surgery. Please bring cases for these items if needed.    Patients discharged the day of surgery will not be allowed to drive home, and someone needs to stay with them for 24 hours.   SURGICAL WAITING ROOM VISITATION Patients may have no more than 2 support people in the waiting area - these visitors may rotate.    Pre-op nurse will coordinate an appropriate time for 1 ADULT support person, who may not rotate, to accompany patient in pre-op.  Children under the age of 19 must have an adult with them who is not the patient and must remain in the main waiting area with an adult.   If the patient needs to stay at the hospital during part of their recovery, the visitor guidelines for inpatient rooms apply.   Please refer to the [facility] website for the visitor guidelines for any additional information.     If you received a COVID test during your pre-op visit  it is requested that you wear a mask when out in public, stay away from anyone that may not be feeling well and notify your surgeon if you develop symptoms. If you have been in contact with anyone that has tested positive in the last 10 days please notify you surgeon.         Pre-operative CHG Bathing Instructions    You can play a key role in reducing the risk of infection after surgery. Your skin needs to be as free of germs as possible. You can reduce the number of germs on your skin by washing with CHG (chlorhexidine gluconate) soap before surgery. CHG is an antiseptic soap that kills germs and continues to kill germs even after washing.  DO NOT use if you have an allergy to chlorhexidine/CHG or antibacterial soaps. If your skin becomes reddened or irritated, stop using the CHG and notify one of our RNs at [phone].               TAKE A SHOWER THE NIGHT BEFORE SURGERY AND THE DAY OF SURGERY     Please keep in mind the following:  DO NOT shave, including legs and underarms, 48 hours prior to surgery.   You may shave your face before/day of surgery.  Place clean sheets on your bed the night before surgery Use a clean washcloth (not used since being washed) for each shower. DO NOT sleep with pet's night before surgery.   CHG Shower Instructions:  Wash your face and private area with normal soap. If you choose to wash your hair, wash  first with your normal shampoo.  After you use shampoo/soap, rinse your hair and body thoroughly to remove shampoo/soap residue.  Turn the water OFF and apply half the bottle of CHG soap to a CLEAN washcloth.  Apply CHG soap ONLY FROM YOUR NECK DOWN TO YOUR TOES (washing for 3-5 minutes)  DO NOT use CHG soap on face, private areas, open wounds, or sores.  Pay special attention to the area where your surgery is being performed.  If you are having back surgery, having someone wash your back for you may be helpful. Wait 2 minutes after CHG soap is applied, then you may rinse off the CHG soap.  Pat dry with a clean towel  Put on clean pajamas     Additional instructions for the day of surgery: DO NOT APPLY any lotions, deodorants, cologne, or perfumes.   Do not wear jewelry or makeup Do not wear nail polish, gel polish, artificial nails, or any other type of covering on natural nails (fingers and toes) Do not bring valuables to the hospital. [facility] is not responsible for valuables/personal belongings. Put on clean/comfortable clothes.  Please brush your teeth.  Ask your nurse before applying any prescription medications to the skin.            Electronically signed by Ellene Route, RN at 10/05/2023  5:02 PM

## 2023-10-06 NOTE — Progress Notes (Signed)
Surgical Instructions     Your procedure is scheduled on Thursday, December 19th, 2024. Report to [facility] Main Entrance "A" at 5:30 A.M., then check in with the Admitting office. Any questions or running late day of surgery: call [phone]   Questions prior to your surgery date: call [phone], Monday-Friday, 8am-4pm. If you experience any cold or flu symptoms such as cough, fever, chills, shortness of breath, etc. between now and your scheduled surgery, please notify us at the above number.            Remember:       Do not eat after midnight the night before your surgery     You may drink clear liquids until 4:30 the morning of your surgery.   Clear liquids allowed are: Water, Non-Citrus Juices (without pulp), Carbonated Beverages, Clear Tea (no milk, honey, etc.), Black Coffee Only (NO MILK, CREAM OR POWDERED CREAMER of any kind), and Gatorade.          Take these medicines the morning of surgery with A SIP OF WATER    May take these medicines IF NEEDED:  acetaminophen (TYLENOL)   cetirizine (ZYRTEC)   Famotidine (ZANTAC 360 PO)  fluticasone (FLONASE)   One week prior to surgery, STOP taking any Aspirin (unless otherwise instructed by your surgeon) Aleve, Naproxen, Ibuprofen, Motrin, Advil, Goody's, BC's, all herbal medications, fish oil, and non-prescription vitamins.                     Do NOT Smoke (Tobacco/Vaping) for 24 hours prior to your procedure.   If you use a CPAP at night, you may bring your mask/headgear for your overnight stay.   You will be asked to remove any contacts, glasses, piercing's, hearing aid's, dentures/partials prior to surgery. Please bring cases for these items if needed.    Patients discharged the day of surgery will not be allowed to drive home, and someone needs to stay with them for 24 hours.   SURGICAL WAITING ROOM VISITATION Patients may have no more than 2 support people in the waiting area - these visitors may rotate.   Pre-op  nurse will coordinate an appropriate time for 1 ADULT support person, who may not rotate, to accompany patient in pre-op.  Children under the age of 31 must have an adult with them who is not the patient and must remain in the main waiting area with an adult.   If the patient needs to stay at the hospital during part of their recovery, the visitor guidelines for inpatient rooms apply.   Please refer to the [facility] website for the visitor guidelines for any additional information.     If you received a COVID test during your pre-op visit  it is requested that you wear a mask when out in public, stay away from anyone that may not be feeling well and notify your surgeon if you develop symptoms. If you have been in contact with anyone that has tested positive in the last 10 days please notify you surgeon.         Pre-operative CHG Bathing Instructions    You can play a key role in reducing the risk of infection after surgery. Your skin needs to be as free of germs as possible. You can reduce the number of germs on your skin by washing with CHG (chlorhexidine gluconate) soap before surgery. CHG is an antiseptic soap that kills germs and continues to kill germs even after washing.  DO NOT use if you have an allergy to chlorhexidine/CHG or antibacterial soaps. If your skin becomes reddened or irritated, stop using the CHG and notify one of our RNs at [phone].               TAKE A SHOWER THE NIGHT BEFORE SURGERY AND THE DAY OF SURGERY     Please keep in mind the following:  DO NOT shave, including legs and underarms, 48 hours prior to surgery.   You may shave your face before/day of surgery.  Place clean sheets on your bed the night before surgery Use a clean washcloth (not used since being washed) for each shower. DO NOT sleep with pet's night before surgery.   CHG Shower Instructions:  Wash your face and private area with normal soap. If you choose to wash your hair, wash first with  your normal shampoo.  After you use shampoo/soap, rinse your hair and body thoroughly to remove shampoo/soap residue.  Turn the water OFF and apply half the bottle of CHG soap to a CLEAN washcloth.  Apply CHG soap ONLY FROM YOUR NECK DOWN TO YOUR TOES (washing for 3-5 minutes)  DO NOT use CHG soap on face, private areas, open wounds, or sores.  Pay special attention to the area where your surgery is being performed.  If you are having back surgery, having someone wash your back for you may be helpful. Wait 2 minutes after CHG soap is applied, then you may rinse off the CHG soap.  Pat dry with a clean towel  Put on clean pajamas     Additional instructions for the day of surgery: DO NOT APPLY any lotions, deodorants, cologne, or perfumes.   Do not wear jewelry or makeup Do not wear nail polish, gel polish, artificial nails, or any other type of covering on natural nails (fingers and toes) Do not bring valuables to the hospital. [facility] is not responsible for valuables/personal belongings. Put on clean/comfortable clothes.  Please brush your teeth.  Ask your nurse before applying any prescription medications to the skin.

## 2023-10-08 ENCOUNTER — Encounter (HOSPITAL_COMMUNITY): Payer: Self-pay | Admitting: Surgery

## 2023-10-08 NOTE — H&P (Signed)
PROVIDER: Happy Begeman Myra Rude, MD   Chief Complaint: Follow-up (Recurrent secondary hyperparathyroidism)  History of Present Illness:  Patient returns to review results of diagnostic studies for evaluation of recurrent secondary hyperparathyroidism. And has had intact PTH levels greater than 1000. Phosphorus levels have been elevated up to 8.6. Calcium level remains in the normal range at 9.8. At my request the patient underwent a nuclear medicine parathyroid scan. This indicated uptake in the left inferior neck as well as in the right forearm at the site of his parathyroid transplant grafts. Patient subsequently underwent a 4D CT scan of the neck on June 08, 2023. This demonstrated an 18 mm possible parathyroid lesion inferior to the lower pole of the left thyroid lobe and anterior to the tracheoesophageal groove. This most likely is in the thyroid thymic tract. This could represent an ectopic or extranumery parathyroid gland. Review of his operative report from 2017 demonstrates that 4 parathyroid glands were removed at the time of total parathyroidectomy, all of which were confirmed histologically.   Review of Systems: A complete review of systems was obtained from the patient. I have reviewed this information and discussed as appropriate with the patient. See HPI as well for other ROS.  Review of Systems  Constitutional: Negative.  HENT: Negative.  Eyes: Negative.  Respiratory: Negative.  Cardiovascular: Negative.  Gastrointestinal: Negative.  Genitourinary: Negative.  Musculoskeletal: Negative.  Skin: Negative.  Neurological: Negative.  Endo/Heme/Allergies: Negative.  Psychiatric/Behavioral: Negative.    Medical History: Past Medical History:  Diagnosis Date  Anemia  Aneurysm (CMS-HCC)  Arthritis  Chronic kidney disease  GERD (gastroesophageal reflux disease)  Hypertension   Patient Active Problem List  Diagnosis  Secondary hyperparathyroidism of renal origin  (CMS/HHS-HCC)   Past Surgical History:  Procedure Laterality Date  REMOVAL OF A DIALYSIS CATHETER 2014  LIGATION OF ARTERIOVENOUS FISTULA Right 04/22/2013  Insertion of Dialysis Catheter  AV FISTULA PLACEMENT Left 05/03/2013  TEE W/O CARDIOVERSION 09/13/2013  PARATHYROIDECTOMY 02/12/2016  BASCILIC VEIN TRANSPOSITION SURGERY Left 12/18/2016  IR DIALY SHUT INTRO NEEDLE/INTRA INITIAL Left 04/06/2023  IR US Guide Vasc. access  COLONOSCOPY  FISTULA SUPERFICIALIZATION Left    Allergies  Allergen Reactions  Chlorhexidine Itching, Other (See Comments) and Rash  Redness and irritation, also  Adhesive Other (See Comments)  PAPER TAPE, PLEASE!!   Current Outpatient Medications on File Prior to Visit  Medication Sig Dispense Refill  cinacalcet (SENSIPAR) 90 MG tablet Take by mouth  midodrine (PROAMATINE) 10 MG tablet Take by mouth  omeprazole (PRILOSEC) 10 MG DR capsule Take 10 mg by mouth once daily  vit B,C-FA-zinc-selen-vit D3-E (RENAPLEX-D) 800 mcg-12.5 mg -2,000 unit Tab Take by mouth   No current facility-administered medications on file prior to visit.   History reviewed. No pertinent family history.   Social History   Tobacco Use  Smoking Status Every Day  Types: Cigars  Smokeless Tobacco Never    Social History   Socioeconomic History  Marital status: Single  Tobacco Use  Smoking status: Every Day  Types: Cigars  Smokeless tobacco: Never  Substance and Sexual Activity  Alcohol use: Yes  Drug use: Never   Objective:   Vitals:   PainSc: 0-No pain   There is no height or weight on file to calculate BMI.  Physical Exam   Anterior cervical incision is well-healed. No palpable abnormalities. Voice quality is normal.  Right forearm incision is well-healed. No palpable abnormalities.   Assessment and Plan:   Secondary hyperparathyroidism of renal origin (CMS/HHS-HCC)  Today we discussed a surgical strategy to address his recurrent secondary  hyperparathyroidism. Patient may have an extranumery and ectopic parathyroid gland in the left inferior position. This could also represent a lymph node or ectopic thyroid tissue. I do not think additional imaging or a biopsy would be worthwhile. Therefore I have recommended exploring this area with the intention of resecting the 18 mm mass and performing a frozen section biopsy intraoperatively. If this is a hypercellular parathyroid gland, then I think we would not open the forearm or bother any of the parathyroid implants at this time. We would wait and follow his laboratory studies to see if this resolves the recurrent secondary hyperparathyroidism. If the mass in the left inferior neck does not represent parathyroid tissue, then we would plan to open the right forearm surgical site and removed any hyperplastic parathyroid grafts at the time of this procedure.  Patient and I discussed the strategy. He is in agreement. We will plan on an overnight stay at [facility]. He dialyzes on Mondays Wednesdays and Fridays. Hopefully he would be able to go home following dialysis on the morning after surgery.   [name], MD [facility] practice Office: [phone]

## 2023-10-09 NOTE — Progress Notes (Signed)
Anesthesia Chart Review:  Case: [id number] Date/Time: 10/12/23 0715   Procedure: NECK EXPLORATION WITH PARATHYROIDECTOMY, POSSIBLE EXCISION GRAFTS RIGHT FOREARM   Anesthesia type: General   Pre-op diagnosis: RECURRENT SECONDARY HYPERPARATHYROIDISM   [facility] ROOM 01 / [facility] OR   Surgeons: [name], MD       DISCUSSION: Patient is a 52 year old male scheduled for the above procedure.   History includes smoking, HTN, ESRD (left arm basilic vein transposition AVF 11/28/16), anemia, GERD, sickle cell trait, secondary hyperparathyroidism (s/p total parathyroidectomy, autotransplant right brachioradialis muscle 02/12/16).  BMI is consistent with obesity.   He has hemodialysis on MWF. He does report hypotension with dialysis and is on midodrine 15 mg prior to dialysis and half way through dialysis. BP 136/79 at PAT.  Anesthesia team to evaluate on the day of surgery. ISTAT on arrival.    VS: BP (P) 136/79   Pulse (P) 95   Temp (P) 36.7 C (Oral)   Resp (P) 18   Ht (P) 5\' 11"  (1.803 m)   Wt (!) (P) 148 kg   SpO2 (P) 99%   BMI (P) 45.50 kg/m   PROVIDERS: Dagoberto Ligas, MD is PCP/nephrologist   LABS: Preoperative labs noted. ISTAT for day of surgery given ESRD.  (all labs ordered are listed, but only abnormal results are displayed)  Labs Reviewed  BASIC METABOLIC PANEL - Abnormal; Notable for the following components:      Result Value   Sodium 134 (*)    Chloride 89 (*)    Creatinine, Ser 6.29 (*)    GFR, Estimated 10 (*)    All other components within normal limits  CBC     IMAGES: CT parathyroid 06/08/23: IMPRESSION: 18 mm possible parathyroid lesion inferior to the lower pole of the left thyroid lobe, anterior to the tracheoesophageal groove.  NM Parathyroid Scan 05/11/23: IMPRESSION: 1. Mild residual activity in the lower pole of the LEFT lobe of the thyroid gland. Indeterminate findings. 2. Focal activity in the RIGHT forearm consistent with  transplanted parathyroid gland.   EKG: 10/06/23: NSR   CV: TTE 04/09/19 ([facility]): Summary: Normal left ventricular systolic function  Ejection fraction is visually estimated at 60-65%  Moderate concentric left ventricular hypertrophy  Diastolic function is indeterminate  Mild pulmonic regurgitation.    TEE 09/13/13 (to evaluate for endocarditis): Study Conclusions  - Left ventricle: Systolic function was normal. The    estimated ejection fraction was in the range of 60% to    65%. Wall motion was normal; there were no regional wall    motion abnormalities. There was an increased relative    contribution of atrial contraction to ventricular filling,    which may be due to hypovolemia.  - Left atrium: No evidence of thrombus in the atrial cavity    or appendage.  - Right atrium: No evidence of thrombus in the atrial cavity    or appendage.  - Atrial septum: No defect or patent foramen ovale was    identified.  - No evidence of endocarditis    Stress Echo 10/09/12 ([facility]): Interpretation: Normal Stress Echocardiogram.    Past Medical History:  Diagnosis Date   Anemia    Iron deficiency   Aneurysm (HCC) 02/15/2007   Right arm large aneurysm   Constipation    End stage renal disease on dialysis [facility])    M- W-F   GERD (gastroesophageal reflux disease)    Hemodialysis finding May 2 ,2005   First  HD at [facility]   History of bronchitis    Hypertension    Sickle cell trait [facility])     Past Surgical History:  Procedure Laterality Date   AV FISTULA PLACEMENT     AV FISTULA PLACEMENT Left 05/03/2013   Procedure: ARTERIOVENOUS (AV) FISTULA CREATION;  Surgeon: Larina Earthly, MD;  Location: [facility] OR;  Service: Vascular;  Laterality: Left;   BASCILIC VEIN TRANSPOSITION Left 11/28/2016   Procedure: BASCILIC VEIN TRANSPOSITION- LEFT ARM;  Surgeon: Larina Earthly, MD;  Location: [facility] OR;  Service: Vascular;  Laterality: Left;   COLONOSCOPY     Hxl: of   colonscopy      FISTULA SUPERFICIALIZATION Left 07/24/2013   Procedure: FISTULA SUPERFICIALIZATION AND LIGATION OF BRANCHES;  Surgeon: Larina Earthly, MD;  Location: [facility] OR;  Service: Vascular;  Laterality: Left;   FRACTURE SURGERY Left Jul 28, 2015   Petela Tendon and Fx   INSERTION OF DIALYSIS CATHETER Right 04/22/2013   Procedure: INSERTION OF DIALYSIS CATHETER;  Surgeon: Larina Earthly, MD;  Location: [facility] OR;  Service: Vascular;  Laterality: Right;   IR DIALY SHUNT INTRO NEEDLE/INTRACATH INITIAL W/IMG LEFT Left 04/06/2023   IR US GUIDE VASC ACCESS LEFT  04/06/2023   LIGATION OF ARTERIOVENOUS  FISTULA Right 04/22/2013   Procedure: LIGATION OF ARTERIOVENOUS  FISTULA  AND RESECTION OF VENOUS ANEURYSM AND ABSCESS;  Surgeon: Larina Earthly, MD;  Location: [facility] OR;  Service: Vascular;  Laterality: Right;   PARATHYROIDECTOMY N/A 02/12/2016   Procedure:  TOTAL PARATHYROIDECTOMY AUTOTRANSPLANT TO RIGHT FOREARM ;  Surgeon: [name], MD;  Location: [facility] OR;  Service: General;  Laterality: N/A;   QUADRICEPS TENDON REPAIR Left 08/24/2015   Procedure: REPAIR OF LEFT QUADRICEP TENDON;  Surgeon: Gean Birchwood, MD;  Location: [facility] OR;  Service: Orthopedics;  Laterality: Left;   REMOVAL OF A DIALYSIS CATHETER  2014   TEE WITHOUT CARDIOVERSION N/A 09/13/2013   Procedure: TRANSESOPHAGEAL ECHOCARDIOGRAM (TEE);  Surgeon: Thurmon Fair, MD;  Location: [facility] ENDOSCOPY;  Service: Cardiovascular;  Laterality: N/A;    MEDICATIONS:  acetaminophen (TYLENOL) 500 MG tablet   cetirizine (ZYRTEC) 10 MG tablet   cinacalcet (SENSIPAR) 90 MG tablet   Famotidine (ZANTAC 360 PO)   fluticasone (FLONASE) 50 MCG/ACT nasal spray   midodrine (PROAMATINE) 10 MG tablet   VELPHORO 500 MG chewable tablet   No current facility-administered medications for this encounter.    Shonna Chock, PA-C Surgical Short Stay/Anesthesiology [facility] Phone [phone] [facility] Phone [phone] 10/09/2023 5:58 PM

## 2023-10-09 NOTE — Anesthesia Preprocedure Evaluation (Signed)
Anesthesia Evaluation  Patient identified by MRN, date of birth, ID band Patient awake    Reviewed: Allergy & Precautions, NPO status , Patient's Chart, lab work & pertinent test results  History of Anesthesia Complications Negative for: history of anesthetic complications  Airway Mallampati: II  TM Distance: >3 FB Neck ROM: Full    Dental  (+) Missing,    Pulmonary Current Smoker   Pulmonary exam normal        Cardiovascular hypertension, Normal cardiovascular exam     Neuro/Psych negative neurological ROS  negative psych ROS   GI/Hepatic Neg liver ROS,GERD  ,,  Endo/Other    Class 3 obesityRECURRENT SECONDARY HYPERPARATHYROIDISM  Renal/GU ESRF and DialysisRenal disease (HD M/W/F)  negative genitourinary   Musculoskeletal negative musculoskeletal ROS (+)    Abdominal   Peds  Hematology  (+) Blood dyscrasia, Sickle cell trait and anemia   Anesthesia Other Findings Day of surgery medications reviewed with patient.  Reproductive/Obstetrics negative OB ROS                              Anesthesia Physical Anesthesia Plan  ASA: 3  Anesthesia Plan: General   Post-op Pain Management: Tylenol PO (pre-op)*   Induction: Intravenous  PONV Risk Score and Plan: 1 and Treatment may vary due to age or medical condition, Midazolam, Ondansetron and Dexamethasone  Airway Management Planned: Oral ETT  Additional Equipment: None  Intra-op Plan:   Post-operative Plan: Extubation in OR  Informed Consent: I have reviewed the patients History and Physical, chart, labs and discussed the procedure including the risks, benefits and alternatives for the proposed anesthesia with the patient or authorized representative who has indicated his/her understanding and acceptance.     Dental advisory given  Plan Discussed with: CRNA  Anesthesia Plan Comments: (PAT note written 10/09/2023 by Shonna Chock, PA-C.  )        Anesthesia Quick Evaluation

## 2023-10-12 ENCOUNTER — Observation Stay (HOSPITAL_COMMUNITY): Payer: Self-pay | Admitting: Vascular Surgery

## 2023-10-12 ENCOUNTER — Encounter (HOSPITAL_COMMUNITY): Payer: Self-pay | Admitting: Surgery

## 2023-10-12 ENCOUNTER — Observation Stay (HOSPITAL_COMMUNITY)
Admission: RE | Admit: 2023-10-12 | Discharge: 2023-10-13 | Disposition: A | Discharge status: Home / Self Care | Payer: Medicare PPO | Attending: Surgery | Admitting: Surgery

## 2023-10-12 ENCOUNTER — Observation Stay (HOSPITAL_BASED_OUTPATIENT_CLINIC_OR_DEPARTMENT_OTHER): Payer: Medicare PPO | Admitting: Anesthesiology

## 2023-10-12 ENCOUNTER — Encounter (HOSPITAL_COMMUNITY)
Admission: RE | Disposition: A | Discharge status: Home / Self Care | Payer: Self-pay | Source: Home / Self Care | Attending: Surgery

## 2023-10-12 DIAGNOSIS — I120 Hypertensive chronic kidney disease with stage 5 chronic kidney disease or end stage renal disease: Secondary | ICD-10-CM | POA: Diagnosis not present

## 2023-10-12 DIAGNOSIS — Z79899 Other long term (current) drug therapy: Secondary | ICD-10-CM | POA: Insufficient documentation

## 2023-10-12 DIAGNOSIS — E211 Secondary hyperparathyroidism, not elsewhere classified: Secondary | ICD-10-CM | POA: Diagnosis not present

## 2023-10-12 DIAGNOSIS — Z992 Dependence on renal dialysis: Secondary | ICD-10-CM | POA: Diagnosis not present

## 2023-10-12 DIAGNOSIS — N186 End stage renal disease: Secondary | ICD-10-CM | POA: Diagnosis not present

## 2023-10-12 DIAGNOSIS — N2581 Secondary hyperparathyroidism of renal origin: Principal | ICD-10-CM | POA: Diagnosis present

## 2023-10-12 DIAGNOSIS — F17210 Nicotine dependence, cigarettes, uncomplicated: Secondary | ICD-10-CM

## 2023-10-12 DIAGNOSIS — F17290 Nicotine dependence, other tobacco product, uncomplicated: Secondary | ICD-10-CM | POA: Insufficient documentation

## 2023-10-12 HISTORY — PX: PARATHYROIDECTOMY: SHX19

## 2023-10-12 LAB — POCT I-STAT, CHEM 8
BUN: 36 mg/dL — ABNORMAL HIGH (ref 6–20)
Calcium, Ion: 0.8 mmol/L — CL (ref 1.15–1.40)
Chloride: 98 mmol/L (ref 98–111)
Creatinine, Ser: 9.7 mg/dL — ABNORMAL HIGH (ref 0.61–1.24)
Glucose, Bld: 92 mg/dL (ref 70–99)
HCT: 38 % — ABNORMAL LOW (ref 39.0–52.0)
Hemoglobin: 12.9 g/dL — ABNORMAL LOW (ref 13.0–17.0)
Potassium: 5.6 mmol/L — ABNORMAL HIGH (ref 3.5–5.1)
Sodium: 132 mmol/L — ABNORMAL LOW (ref 135–145)
TCO2: 26 mmol/L (ref 22–32)

## 2023-10-12 LAB — CALCIUM
Calcium: 9.6 mg/dL (ref 8.9–10.3)
Calcium: 9.7 mg/dL (ref 8.9–10.3)
Calcium: 9.7 mg/dL (ref 8.9–10.3)

## 2023-10-12 LAB — HEPATITIS B SURFACE ANTIGEN: Hepatitis B Surface Ag: NONREACTIVE

## 2023-10-12 SURGERY — PARATHYROIDECTOMY
Anesthesia: General

## 2023-10-12 MED ORDER — PROPOFOL 10 MG/ML IV BOLUS
INTRAVENOUS | Status: DC | PRN
  Administered 2023-10-12 (×2): 200 mg via INTRAVENOUS

## 2023-10-12 MED ORDER — MIDODRINE HCL 5 MG PO TABS
15.0000 mg | ORAL_TABLET | ORAL | Status: DC
  Filled 2023-10-12: qty 3

## 2023-10-12 MED ORDER — SODIUM ZIRCONIUM CYCLOSILICATE 10 G PO PACK
10.0000 g | PACK | Freq: Once | ORAL | Status: AC
  Administered 2023-10-12: 10 g via ORAL
  Filled 2023-10-12 (×2): qty 1

## 2023-10-12 MED ORDER — 0.9 % SODIUM CHLORIDE (POUR BTL) OPTIME
TOPICAL | Status: DC | PRN
  Administered 2023-10-12: 1000 mL

## 2023-10-12 MED ORDER — PROPOFOL 10 MG/ML IV BOLUS
INTRAVENOUS | Status: AC
  Filled 2023-10-12: qty 20

## 2023-10-12 MED ORDER — BUPIVACAINE HCL (PF) 0.25 % IJ SOLN
INTRAMUSCULAR | Status: DC | PRN
  Administered 2023-10-12: 10 mL

## 2023-10-12 MED ORDER — HEMOSTATIC AGENTS (NO CHARGE) OPTIME
TOPICAL | Status: DC | PRN
  Administered 2023-10-12: 1 via TOPICAL

## 2023-10-12 MED ORDER — ACETAMINOPHEN 325 MG PO TABS: 650.0000 mg | ORAL_TABLET | Freq: Four times a day (QID) | ORAL | Status: DC | PRN

## 2023-10-12 MED ORDER — PHENYLEPHRINE HCL-NACL 20-0.9 MG/250ML-% IV SOLN
INTRAVENOUS | Status: DC | PRN
  Administered 2023-10-12: 40 ug/min via INTRAVENOUS

## 2023-10-12 MED ORDER — DROPERIDOL 2.5 MG/ML IJ SOLN: 0.6250 mg | Freq: Once | INTRAMUSCULAR | Status: DC | PRN

## 2023-10-12 MED ORDER — ROCURONIUM BROMIDE 10 MG/ML (PF) SYRINGE
PREFILLED_SYRINGE | INTRAVENOUS | Status: DC | PRN
  Administered 2023-10-12 (×2): 100 mg via INTRAVENOUS

## 2023-10-12 MED ORDER — DEXAMETHASONE SODIUM PHOSPHATE 10 MG/ML IJ SOLN
INTRAMUSCULAR | Status: DC | PRN
  Administered 2023-10-12: 5 mg via INTRAVENOUS

## 2023-10-12 MED ORDER — HYDROMORPHONE HCL 1 MG/ML IJ SOLN
1.0000 mg | INTRAMUSCULAR | Status: DC | PRN
  Administered 2023-10-12: 1 mg via INTRAVENOUS
  Filled 2023-10-12: qty 1

## 2023-10-12 MED ORDER — SUCROFERRIC OXYHYDROXIDE 500 MG PO CHEW
1000.0000 mg | CHEWABLE_TABLET | Freq: Three times a day (TID) | ORAL | Status: DC
  Administered 2023-10-12: 1000 mg via ORAL
  Filled 2023-10-12 (×5): qty 2

## 2023-10-12 MED ORDER — ONDANSETRON HCL 4 MG/2ML IJ SOLN
INTRAMUSCULAR | Status: DC | PRN
  Administered 2023-10-12: 4 mg via INTRAVENOUS

## 2023-10-12 MED ORDER — TRAMADOL HCL 50 MG PO TABS: 50.0000 mg | ORAL_TABLET | Freq: Four times a day (QID) | ORAL | Status: DC | PRN

## 2023-10-12 MED ORDER — DEXTROSE-SODIUM CHLORIDE 5-0.9 % IV SOLN: INTRAVENOUS | Status: AC

## 2023-10-12 MED ORDER — OXYCODONE HCL 5 MG PO TABS
5.0000 mg | ORAL_TABLET | ORAL | Status: DC | PRN
  Administered 2023-10-12 – 2023-10-13 (×2): 5 mg via ORAL
  Filled 2023-10-12 (×2): qty 1

## 2023-10-12 MED ORDER — FENTANYL CITRATE (PF) 250 MCG/5ML IJ SOLN
INTRAMUSCULAR | Status: AC
  Filled 2023-10-12: qty 5

## 2023-10-12 MED ORDER — FENTANYL CITRATE (PF) 250 MCG/5ML IJ SOLN
INTRAMUSCULAR | Status: DC | PRN
  Administered 2023-10-12: 100 ug via INTRAVENOUS

## 2023-10-12 MED ORDER — TRANEXAMIC ACID-NACL 1000-0.7 MG/100ML-% IV SOLN
INTRAVENOUS | Status: AC
  Filled 2023-10-12: qty 100

## 2023-10-12 MED ORDER — LIDOCAINE HCL (CARDIAC) PF 100 MG/5ML IV SOSY
PREFILLED_SYRINGE | INTRAVENOUS | Status: DC | PRN
  Administered 2023-10-12: 60 mg via INTRATRACHEAL

## 2023-10-12 MED ORDER — MIDODRINE HCL 5 MG PO TABS
15.0000 mg | ORAL_TABLET | ORAL | Status: DC
  Administered 2023-10-13: 15 mg via ORAL

## 2023-10-12 MED ORDER — SUGAMMADEX SODIUM 200 MG/2ML IV SOLN
INTRAVENOUS | Status: DC | PRN
  Administered 2023-10-12: 300 mg via INTRAVENOUS

## 2023-10-12 MED ORDER — MIDODRINE HCL 5 MG PO TABS
15.0000 mg | ORAL_TABLET | ORAL | Status: DC
Start: 2023-10-13 — End: 2023-10-12

## 2023-10-12 MED ORDER — FENTANYL CITRATE (PF) 100 MCG/2ML IJ SOLN
25.0000 ug | INTRAMUSCULAR | Status: DC | PRN
  Administered 2023-10-12 (×2): 50 ug via INTRAVENOUS

## 2023-10-12 MED ORDER — ACETAMINOPHEN 500 MG PO TABS
1000.0000 mg | ORAL_TABLET | Freq: Once | ORAL | Status: AC
Start: 2023-10-12 — End: 2023-10-12
  Administered 2023-10-12: 1000 mg via ORAL

## 2023-10-12 MED ORDER — ORAL CARE MOUTH RINSE
15.0000 mL | Freq: Once | OROMUCOSAL | Status: AC
  Administered 2023-10-12: 15 mL via OROMUCOSAL

## 2023-10-12 MED ORDER — OXYCODONE HCL 5 MG/5ML PO SOLN: 5.0000 mg | Freq: Once | ORAL | Status: DC | PRN

## 2023-10-12 MED ORDER — SODIUM CHLORIDE 0.9 % IV SOLN
3.0000 g | INTRAVENOUS | Status: AC
  Administered 2023-10-12: 3 g via INTRAVENOUS

## 2023-10-12 MED ORDER — CEFAZOLIN SODIUM-DEXTROSE 2-4 GM/100ML-% IV SOLN
INTRAVENOUS | Status: AC
  Filled 2023-10-12: qty 100

## 2023-10-12 MED ORDER — BUPIVACAINE HCL (PF) 0.25 % IJ SOLN
INTRAMUSCULAR | Status: AC
  Filled 2023-10-12: qty 30

## 2023-10-12 MED ORDER — ACETAMINOPHEN 650 MG RE SUPP: 650.0000 mg | Freq: Four times a day (QID) | RECTAL | Status: DC | PRN

## 2023-10-12 MED ORDER — SUCROFERRIC OXYHYDROXIDE 500 MG PO CHEW
1500.0000 mg | CHEWABLE_TABLET | Freq: Three times a day (TID) | ORAL | Status: DC
Start: 2023-10-12 — End: 2023-10-14
  Administered 2023-10-13 (×2): 2000 mg via ORAL
  Filled 2023-10-12 (×5): qty 4

## 2023-10-12 MED ORDER — PHENYLEPHRINE 80 MCG/ML (10ML) SYRINGE FOR IV PUSH (FOR BLOOD PRESSURE SUPPORT)
PREFILLED_SYRINGE | INTRAVENOUS | Status: DC | PRN
  Administered 2023-10-12 (×3): 160 ug via INTRAVENOUS

## 2023-10-12 MED ORDER — CHLORHEXIDINE GLUCONATE 0.12 % MT SOLN
15.0000 mL | Freq: Once | OROMUCOSAL | Status: DC
Start: 2023-10-12 — End: 2023-10-12

## 2023-10-12 MED ORDER — OXYCODONE HCL 5 MG PO TABS
5.0000 mg | ORAL_TABLET | Freq: Once | ORAL | Status: DC | PRN
Start: 2023-10-12 — End: 2023-10-12

## 2023-10-12 MED ORDER — ONDANSETRON HCL 4 MG/2ML IJ SOLN
4.0000 mg | Freq: Four times a day (QID) | INTRAMUSCULAR | Status: DC | PRN
Start: 2023-10-12 — End: 2023-10-14

## 2023-10-12 MED ORDER — DOXERCALCIFEROL 4 MCG/2ML IV SOLN
4.0000 ug | INTRAVENOUS | Status: DC
  Administered 2023-10-13: 4 ug via INTRAVENOUS
  Filled 2023-10-12 (×2): qty 2

## 2023-10-12 MED ORDER — FENTANYL CITRATE (PF) 100 MCG/2ML IJ SOLN
INTRAMUSCULAR | Status: AC
  Filled 2023-10-12: qty 2

## 2023-10-12 MED ORDER — EPHEDRINE SULFATE-NACL 50-0.9 MG/10ML-% IV SOSY
PREFILLED_SYRINGE | INTRAVENOUS | Status: DC | PRN
  Administered 2023-10-12 (×5): 10 mg via INTRAVENOUS

## 2023-10-12 MED ORDER — ONDANSETRON 4 MG PO TBDP: 4.0000 mg | ORAL_TABLET | Freq: Four times a day (QID) | ORAL | Status: DC | PRN

## 2023-10-12 MED ORDER — STERILE WATER FOR IRRIGATION IR SOLN
Status: DC | PRN
  Administered 2023-10-12: 1000 mL

## 2023-10-12 MED ORDER — LIDOCAINE 2% (20 MG/ML) 5 ML SYRINGE: INTRAMUSCULAR | Status: DC | PRN

## 2023-10-12 SURGICAL SUPPLY — 45 items
ATTRACTOMAT 16X20 MAGNETIC DRP (DRAPES) ×1 IMPLANT
BAG COUNTER SPONGE SURGICOUNT (BAG) ×1 IMPLANT
BLADE SURG 15 STRL LF DISP TIS (BLADE) ×1 IMPLANT
CANISTER SUCT 3000ML PPV (MISCELLANEOUS) ×1 IMPLANT
CHLORAPREP W/TINT 26 (MISCELLANEOUS) ×1 IMPLANT
CLIP TI MEDIUM 6 (CLIP) ×1 IMPLANT
CLIP TI WIDE RED SMALL 6 (CLIP) ×1 IMPLANT
CNTNR URN SCR LID CUP LEK RST (MISCELLANEOUS) ×1 IMPLANT
CONTAINER PROTECT SURGISLUSH (MISCELLANEOUS) ×1 IMPLANT
COVER SURGICAL LIGHT HANDLE (MISCELLANEOUS) ×1 IMPLANT
DERMABOND ADVANCED .7 DNX12 (GAUZE/BANDAGES/DRESSINGS) IMPLANT
DRAPE LAPAROTOMY 100X72 PEDS (DRAPES) ×1 IMPLANT
DRAPE UTILITY XL STRL (DRAPES) ×1 IMPLANT
ELECT CAUTERY BLADE 6.4 (BLADE) ×1 IMPLANT
ELECT REM PT RETURN 9FT ADLT (ELECTROSURGICAL) ×1
ELECTRODE REM PT RTRN 9FT ADLT (ELECTROSURGICAL) ×1 IMPLANT
GAUZE 4X4 16PLY ~~LOC~~+RFID DBL (SPONGE) ×1 IMPLANT
GAUZE SPONGE 2X2 8PLY STRL LF (GAUZE/BANDAGES/DRESSINGS) ×1 IMPLANT
GLOVE SURG ORTHO 8.0 STRL STRW (GLOVE) ×1 IMPLANT
GOWN STRL REUS W/ TWL LRG LVL3 (GOWN DISPOSABLE) ×1 IMPLANT
GOWN STRL REUS W/ TWL XL LVL3 (GOWN DISPOSABLE) ×1 IMPLANT
HEMOSTAT ARISTA ABSORB 3G PWDR (HEMOSTASIS) IMPLANT
HEMOSTAT SURGICEL 2X4 FIBR (HEMOSTASIS) ×1 IMPLANT
ILLUMINATOR WAVEGUIDE N/F (MISCELLANEOUS) ×1 IMPLANT
KIT BASIN OR (CUSTOM PROCEDURE TRAY) ×1 IMPLANT
KIT TURNOVER KIT B (KITS) ×1 IMPLANT
NDL HYPO 25GX1X1/2 BEV (NEEDLE) ×1 IMPLANT
NEEDLE HYPO 25GX1X1/2 BEV (NEEDLE) ×1
NS IRRIG 1000ML POUR BTL (IV SOLUTION) ×1 IMPLANT
PACK SRG BSC III STRL LF ECLPS (CUSTOM PROCEDURE TRAY) ×1 IMPLANT
PAD ARMBOARD 7.5X6 YLW CONV (MISCELLANEOUS) ×1 IMPLANT
PENCIL BUTTON HOLSTER BLD 10FT (ELECTRODE) ×1 IMPLANT
POSITIONER HEAD DONUT 9IN (MISCELLANEOUS) ×1 IMPLANT
SHEARS HARMONIC 9CM CVD (BLADE) ×1 IMPLANT
SPONGE INTESTINAL PEANUT (DISPOSABLE) IMPLANT
STRIP CLOSURE SKIN 1/2X4 (GAUZE/BANDAGES/DRESSINGS) ×1 IMPLANT
SUT MNCRL AB 4-0 PS2 18 (SUTURE) ×1 IMPLANT
SUT SILK 2-0 18XBRD TIE 12 (SUTURE) IMPLANT
SUT SILK 3-0 18XBRD TIE 12 (SUTURE) IMPLANT
SUT VIC AB 3-0 SH 18 (SUTURE) ×1 IMPLANT
SYR BULB EAR ULCER 3OZ GRN STR (SYRINGE) ×1 IMPLANT
SYR CONTROL 10ML LL (SYRINGE) ×1 IMPLANT
TOWEL GREEN STERILE (TOWEL DISPOSABLE) ×1 IMPLANT
TOWEL GREEN STERILE FF (TOWEL DISPOSABLE) ×1 IMPLANT
TUBE CONNECTING 12X1/4 (SUCTIONS) ×1 IMPLANT

## 2023-10-12 NOTE — Transfer of Care (Signed)
Immediate Anesthesia Transfer of Care Note  Patient: [name]  Procedure(s) Performed: NECK EXPLORATION WITH PARATHYROIDECTOMY  Patient Location: PACU  Anesthesia Type:General  Level of Consciousness: awake, alert , and oriented  Airway & Oxygen Therapy: Patient Spontanous Breathing and Patient connected to face mask oxygen  Post-op Assessment: Report given to RN and Post -op Vital signs reviewed and stable  Post vital signs: Reviewed and stable  Last Vitals:  Vitals Value Taken Time  BP 112/69 10/12/23 0915  Temp    Pulse 71 10/12/23 0919  Resp 21 10/12/23 0919  SpO2 97 % 10/12/23 0919  Vitals shown include unfiled device data.  Last Pain:  Vitals:   10/12/23 0631  TempSrc:   PainSc: 0-No pain         Complications: No notable events documented.

## 2023-10-12 NOTE — Anesthesia Postprocedure Evaluation (Signed)
Anesthesia Post Note  Patient: [name]  Procedure(s) Performed: NECK EXPLORATION WITH PARATHYROIDECTOMY     Patient location during evaluation: PACU Anesthesia Type: General Level of consciousness: awake and alert Pain management: pain level controlled Vital Signs Assessment: post-procedure vital signs reviewed and stable Respiratory status: spontaneous breathing, nonlabored ventilation and respiratory function stable Cardiovascular status: blood pressure returned to baseline Postop Assessment: no apparent nausea or vomiting Anesthetic complications: no   There were no known notable events for this encounter.  Last Vitals:  Vitals:   10/12/23 0930 10/12/23 0945  BP: (!) 101/59 102/63  Pulse: 71 74  Resp: (!) 23 16  Temp:    SpO2: 94% 98%    Last Pain:  Vitals:   10/12/23 0945  TempSrc:   PainSc: 0-No pain                 Shanda Howells

## 2023-10-12 NOTE — Consult Note (Addendum)
[name] Renal Consultation Note    Indication for Consultation:  Management of ESRD/hemodialysis, anemia, hypertension/volume, and secondary hyperparathyroidism. PCP:  HPI: [name] is a 52 y.o. male with ESRD, HTN, and Hx prior total parathyroidectomy with autotransplantation on 1 gland to R forearm in 2017.   Noted to have rising PTH levels, underwent nuclear parathyroid scan 04/2013 showing residual activity in left lower thyroid gland either representing residual parathyroid tissue from his prior surgery or a 5th gland. Underwent resection this AM with Dr. Gerrit Friends.  Seen in PACU - feels well, eating lunch. No CP/dyspnea, abd pain, N/V/D, edema.  Dialyzes in [place] on MWF schedule - due for HD tomorrow. Uses LUE AVF - no recent issues.  Past Medical History:  Diagnosis Date   Anemia    Iron deficiency   Aneurysm (HCC) 02/15/2007   Right arm large aneurysm   Constipation    End stage renal disease on dialysis [facility])    M- W-F   GERD (gastroesophageal reflux disease)    Hemodialysis finding May 2 ,2005   First HD at [facility].   History of bronchitis    Hypertension    Sickle cell trait [facility])    Past Surgical History:  Procedure Laterality Date   AV FISTULA PLACEMENT     AV FISTULA PLACEMENT Left 05/03/2013   Procedure: ARTERIOVENOUS (AV) FISTULA CREATION;  Surgeon: Larina Earthly, MD;  Location: [facility] OR;  Service: Vascular;  Laterality: Left;   BASCILIC VEIN TRANSPOSITION Left 11/28/2016   Procedure: BASCILIC VEIN TRANSPOSITION- LEFT ARM;  Surgeon: Larina Earthly, MD;  Location: [facility] OR;  Service: Vascular;  Laterality: Left;   COLONOSCOPY     Hxl: of   colonscopy     FISTULA SUPERFICIALIZATION Left 07/24/2013   Procedure: FISTULA SUPERFICIALIZATION AND LIGATION OF BRANCHES;  Surgeon: Larina Earthly, MD;  Location: [facility] OR;  Service: Vascular;  Laterality: Left;   FRACTURE SURGERY Left Jul 28, 2015   Petela Tendon and Fx   INSERTION OF DIALYSIS CATHETER  Right 04/22/2013   Procedure: INSERTION OF DIALYSIS CATHETER;  Surgeon: Larina Earthly, MD;  Location: [facility] OR;  Service: Vascular;  Laterality: Right;   IR DIALY SHUNT INTRO NEEDLE/INTRACATH INITIAL W/IMG LEFT Left 04/06/2023   IR US GUIDE VASC ACCESS LEFT  04/06/2023   LIGATION OF ARTERIOVENOUS  FISTULA Right 04/22/2013   Procedure: LIGATION OF ARTERIOVENOUS  FISTULA  AND RESECTION OF VENOUS ANEURYSM AND ABSCESS;  Surgeon: Larina Earthly, MD;  Location: [facility] OR;  Service: Vascular;  Laterality: Right;   PARATHYROIDECTOMY N/A 02/12/2016   Procedure:  TOTAL PARATHYROIDECTOMY AUTOTRANSPLANT TO RIGHT FOREARM ;  Surgeon: [name], MD;  Location: [facility] OR;  Service: General;  Laterality: N/A;   QUADRICEPS TENDON REPAIR Left 08/24/2015   Procedure: REPAIR OF LEFT QUADRICEP TENDON;  Surgeon: Gean Birchwood, MD;  Location: [facility] OR;  Service: Orthopedics;  Laterality: Left;   REMOVAL OF A DIALYSIS CATHETER  2014   TEE WITHOUT CARDIOVERSION N/A 09/13/2013   Procedure: TRANSESOPHAGEAL ECHOCARDIOGRAM (TEE);  Surgeon: Thurmon Fair, MD;  Location: [facility] ENDOSCOPY;  Service: Cardiovascular;  Laterality: N/A;   Family History  Problem Relation Age of Onset   Hypertension Mother    Heart disease Mother        before age 65   Other Brother        DVT   Social History:  reports that he has been smoking cigars. He has never used smokeless tobacco. He reports current  alcohol use. He reports that he does not use drugs.  ROS: As per HPI otherwise negative.  Physical Exam: Vitals:   10/12/23 0945 10/12/23 1000 10/12/23 1015 10/12/23 1030  BP: 102/63 103/67 109/70 111/66  Pulse: 74 79 80 79  Resp: 16 19 (!) 26 (!) 24  Temp:      TempSrc:      SpO2: 98% 96% 90% 93%  Weight:      Height:         General: Well developed, well nourished, in no acute distress. Head: Normocephalic, atraumatic, sclera non-icteric, mucus membranes are moist. Neck: Supple without lymphadenopathy/masses. Glued horizontal incision. Lungs: Clear  bilaterally to auscultation without wheezes, rales, or rhonchi. Breathing is unlabored. Heart: RRR with normal S1, S2. No murmurs, rubs, or gallops appreciated. Abdomen: Soft, non-tender, non-distended with normoactive bowel sounds.  Musculoskeletal:  Strength and tone appear normal for age. Lower extremities: No edema or ischemic changes, no open wounds. Neuro: Alert and oriented X 3. Moves all extremities spontaneously. Psych:  Responds to questions appropriately with a normal affect. Dialysis Access: LUE AVF + t/b  Allergies  Allergen Reactions   Tape Other (See Comments)    PAPER TAPE, PLEASE!!   Chlorhexidine Itching, Rash and Other (See Comments)    Redness and irritation, also   Prior to Admission medications   Medication Sig Start Date End Date Taking? Authorizing Provider  acetaminophen (TYLENOL) 500 MG tablet Take 1,000 mg by mouth 2 (two) times daily as needed (pain or headaches).   Yes [provider]  cetirizine (ZYRTEC) 10 MG tablet Take 10 mg by mouth daily as needed for allergies. 05/08/23  Yes [provider]  cinacalcet (SENSIPAR) 90 MG tablet Take 90 mg by mouth daily.   Yes [provider]  Famotidine (ZANTAC 360 PO) Take 1-2 tablets by mouth daily as needed (reflux).   Yes [provider]  fluticasone (FLONASE) 50 MCG/ACT nasal spray Place 1-2 sprays into both nostrils as needed for allergies. 07/13/20  Yes [provider]  midodrine (PROAMATINE) 10 MG tablet Take 10 mg by mouth See admin instructions. Take 1 tablet 15 mg by mouth prior to dialysis and 1 tablet 15 mg half way through dialysis - Monday, Wednesday, Friday   Yes [provider]  VELPHORO 500 MG chewable tablet Chew 1,500-2,000 mg by mouth 3 (three) times daily with meals. 06/19/23  Yes [provider]   Current Facility-Administered Medications  Medication Dose Route Frequency Provider Last Rate Last Admin   ceFAZolin (ANCEF) 2-4 GM/100ML-% IVPB             droperidol (INAPSINE) 2.5 MG/ML injection 0.625 mg  0.625 mg Intravenous Once PRN Kaylyn Layer, MD       fentaNYL (SUBLIMAZE) 100 MCG/2ML injection            fentaNYL (SUBLIMAZE) injection 25-50 mcg  25-50 mcg Intravenous Q5 min PRN Kaylyn Layer, MD   50 mcg at 10/12/23 1010   oxyCODONE (Oxy IR/ROXICODONE) immediate release tablet 5 mg  5 mg Oral Once PRN Kaylyn Layer, MD       Or   oxyCODONE (ROXICODONE) 5 MG/5ML solution 5 mg  5 mg Oral Once PRN Kaylyn Layer, MD       sodium zirconium cyclosilicate (LOKELMA) packet 10 g  10 g Oral Once Julien Nordmann, PA-C       tranexamic acid (CYKLOKAPRON) 1000MG /119mL IVPB  Labs: Basic Metabolic Panel: Recent Labs  Lab 10/06/23 1530 10/12/23 0725  NA 134* 132*  K 3.9 5.6*  CL 89* 98  CO2 30  --   GLUCOSE 88 92  BUN 14 36*  CREATININE 6.29* 9.70*  CALCIUM 9.1  --    Dialysis Orders:  MWF - [place] 5:30hr, 450/A1.5, EDW 145.7kg, 2K/2Ca, UFP #2, AVF, 15g, heparin 10K bolus + 4000 mid-run bolus - no ESA, Hgb > 12 - Hectoral IV q HD  Assessment/Plan:  Secondary Hyperparathyroidism: S/p resection of 5th gland v. remnant 12/19 (prior total parathyroidectomy 2017). Check Ca q 6 hour, although do not anticipate severe drop.  Continue Velphoro as binder but reducing dose, will half VDRA dose for tomorrow, check Phos/PTH in AM. Hyperkalemia: Lokelma x 1 dose ordered for today to hold him until HD tomorrow.  ESRD:  Usual MWF - for HD tomorrow. Shortened HD to 4hr, 2K.  BP/volume: BP stable, no edema on exam. Uses midodrine pre-HD.  Anemia: Hgb > 12, no ESA needed   Ozzie Hoyle, Cordelia Poche 10/12/2023, 10:35 AM  [employer]

## 2023-10-12 NOTE — Anesthesia Procedure Notes (Signed)
Procedure Name: Intubation Date/Time: 10/12/2023 7:52 AM  Performed by: Allyn Kenner, CRNAPre-anesthesia Checklist: Patient identified, Emergency Drugs available, Suction available and Patient being monitored Patient Re-evaluated:Patient Re-evaluated prior to induction Oxygen Delivery Method: Circle System Utilized Preoxygenation: Pre-oxygenation with 100% oxygen Induction Type: IV induction Ventilation: Mask ventilation without difficulty Laryngoscope Size: Glidescope and 3 Grade View: Grade I Tube type: Oral Number of attempts: 1 Airway Equipment and Method: Stylet and Oral airway Placement Confirmation: ETT inserted through vocal cords under direct vision, positive ETCO2 and breath sounds checked- equal and bilateral Secured at: 21 cm Tube secured with: Tape Dental Injury: Teeth and Oropharynx as per pre-operative assessment

## 2023-10-12 NOTE — Op Note (Signed)
Operative Note  Pre-operative Diagnosis: Secondary hyperparathyroidism, recurrent  Post-operative Diagnosis: Same  Surgeon:  [name], MD  Assistant: None   Procedure: Neck exploration with resection of left inferior hypercellular parathyroid gland  Anesthesia: General  Estimated Blood Loss: 10 cc  Drains: None         Specimen: Parathyroid gland to pathology for frozen section and permanent  Indications:  Patient returns to review results of diagnostic studies for evaluation of recurrent secondary hyperparathyroidism. And has had intact PTH levels greater than 1000. Phosphorus levels have been elevated up to 8.6. Calcium level remains in the normal range at 9.8. At my request the patient underwent a nuclear medicine parathyroid scan. This indicated uptake in the left inferior neck as well as in the right forearm at the site of his parathyroid transplant grafts. Patient subsequently underwent a 4D CT scan of the neck on June 08, 2023. This demonstrated an 18 mm possible parathyroid lesion inferior to the lower pole of the left thyroid lobe and anterior to the tracheoesophageal groove. This most likely is in the thyroid thymic tract. This could represent an ectopic or extranumery parathyroid gland. Review of his operative report from 2017 demonstrates that 4 parathyroid glands were removed at the time of total parathyroidectomy, all of which were confirmed histologically.  Patient now comes to surgery for neck exploration and excision of what appears to be a left inferior parathyroid gland.  Procedure:  The patient was seen in the pre-op holding area. The risks, benefits, complications, treatment options, and expected outcomes were previously discussed with the patient. The patient agreed with the proposed plan and has signed the informed consent form.  The patient was brought to the operating room by the surgical team, identified as [name] and the procedure verified. A "time out"  was completed and the above information confirmed.  Following administration of general anesthesia, the patient was positioned and then prepped and draped in usual aseptic fashion.  After ascertaining that an adequate level of anesthesia been achieved, the previous Kocher incision is reopened on the left side with a #15 blade.  Dissection is carried into subcutaneous tissues and hemostasis achieved with the electrocautery.  Dissection is carried down through the platysma.  Scar tissue is noted.  Previous suture material is noted.  Midline is identified.  Strap muscles are incised in the midline and elevated off of the thyroid isthmus.  The left thyroid lobe is identified.  With gentle dissection the inferior pole of the left lobe of the thyroid is mobilized.  Venous tributaries are divided between small ligaclips with the harmonic scalpel.  Dissection posteriorly reveals what appears to be an enlarged parathyroid gland adjacent to the trachea and just above the tracheoesophageal groove.  This is gently mobilized.  There is some scar tissue.  It is difficult to tell whether or not this represents an extranumerary parathyroid gland or whether this was a remnant of parathyroid tissue which has become hyperplastic.  It is carefully mobilized and vascular tributaries divided between small ligaclips with the harmonic scalpel.  The entire gland is excised and it is submitted to pathology for frozen section.  Frozen section biopsy by Dr. Jimmy Picket confirms hypercellular parathyroid tissue.  Neck is irrigated with warm saline and good hemostasis is noted.  Fibrillar was placed throughout the operative field.  Strap muscles are reapproximated in the midline of interrupted 3-0 Vicryl sutures.  Platysma is closed with interrupted 3-0 Vicryl sutures.  Skin is anesthetized with local  anesthetic.  Skin edges are reapproximated with a running 4-0 Monocryl subcuticular suture.  Wound was washed and dried and Dermabond is  applied as dressing.  Patient is awakened from anesthesia and transferred to the recovery room in stable condition.  The patient tolerated the procedure well.   [name], MD [facility] Surgery Office: [phone]

## 2023-10-12 NOTE — Interval H&P Note (Signed)
History and Physical Interval Note:  10/12/2023 7:01 AM  [name]  has presented today for surgery, with the diagnosis of RECURRENT SECONDARY HYPERPARATHYROIDISM.  The various methods of treatment have been discussed with the patient and family. After consideration of risks, benefits and other options for treatment, the patient has consented to    Procedure(s): NECK EXPLORATION WITH PARATHYROIDECTOMY, POSSIBLE EXCISION GRAFTS RIGHT FOREARM (N/A) as a surgical intervention.    The patient's history has been reviewed, patient examined, no change in status, stable for surgery.  I have reviewed the patient's chart and labs.  Questions were answered to the patient's satisfaction.    [name], MD [facility] practice Office: [phone]   [name]

## 2023-10-13 ENCOUNTER — Encounter (HOSPITAL_COMMUNITY): Payer: Self-pay | Admitting: Surgery

## 2023-10-13 DIAGNOSIS — N2581 Secondary hyperparathyroidism of renal origin: Secondary | ICD-10-CM | POA: Diagnosis not present

## 2023-10-13 LAB — RENAL FUNCTION PANEL
Albumin: 3.3 g/dL — ABNORMAL LOW (ref 3.5–5.0)
Anion gap: 14 (ref 5–15)
BUN: 47 mg/dL — ABNORMAL HIGH (ref 6–20)
CO2: 26 mmol/L (ref 22–32)
Calcium: 9.7 mg/dL (ref 8.9–10.3)
Chloride: 93 mmol/L — ABNORMAL LOW (ref 98–111)
Creatinine, Ser: 11.78 mg/dL — ABNORMAL HIGH (ref 0.61–1.24)
GFR, Estimated: 5 mL/min — ABNORMAL LOW (ref >60)
Glucose, Bld: 114 mg/dL — ABNORMAL HIGH (ref 70–99)
Phosphorus: 7.4 mg/dL — ABNORMAL HIGH (ref 2.5–4.6)
Potassium: 4.3 mmol/L (ref 3.5–5.1)
Sodium: 133 mmol/L — ABNORMAL LOW (ref 135–145)

## 2023-10-13 LAB — CBC
HCT: 34.8 % — ABNORMAL LOW (ref 39.0–52.0)
Hemoglobin: 12.1 g/dL — ABNORMAL LOW (ref 13.0–17.0)
MCH: 29.8 pg (ref 26.0–34.0)
MCHC: 34.8 g/dL (ref 30.0–36.0)
MCV: 85.7 fL (ref 80.0–100.0)
Platelets: 209 10*3/uL (ref 150–400)
RBC: 4.06 MIL/uL — ABNORMAL LOW (ref 4.22–5.81)
RDW: 12.9 % (ref 11.5–15.5)
WBC: 9.3 10*3/uL (ref 4.0–10.5)
nRBC: 0 % (ref 0.0–0.2)

## 2023-10-13 LAB — CALCIUM
Calcium: 9.8 mg/dL (ref 8.9–10.3)
Calcium: 9.8 mg/dL (ref 8.9–10.3)

## 2023-10-13 LAB — HEPATITIS B SURFACE ANTIBODY, QUANTITATIVE: Hep B S AB Quant (Post): 61.9 m[IU]/mL

## 2023-10-13 MED ORDER — OXYCODONE HCL 5 MG PO TABS: 5.0000 mg | ORAL_TABLET | Freq: Four times a day (QID) | ORAL | 0 refills | Status: AC | PRN

## 2023-10-13 MED ORDER — TRAMADOL HCL 50 MG PO TABS: 50.0000 mg | ORAL_TABLET | Freq: Two times a day (BID) | ORAL | Status: DC | PRN

## 2023-10-13 MED ORDER — HEPARIN SODIUM (PORCINE) 1000 UNIT/ML IJ SOLN
INTRAMUSCULAR | Status: AC
  Administered 2023-10-13: 10000 [IU] via INTRAVENOUS
  Filled 2023-10-13: qty 10

## 2023-10-13 MED ORDER — HEPARIN SODIUM (PORCINE) 1000 UNIT/ML IJ SOLN
10000.0000 [IU] | Freq: Once | INTRAMUSCULAR | Status: AC
  Filled 2023-10-13: qty 10

## 2023-10-13 NOTE — Discharge Summary (Signed)
    Physician Discharge Summary   Patient ID: CAESON GALINSKY MRN: [id number] DOB/AGE: 07/01/1971 52 y.o.  Admit date: 10/12/2023  Discharge date: 10/13/2023  Discharge Diagnoses:  Principal Problem:   Secondary hyperparathyroidism [facility]) Active Problems:   Secondary hyperparathyroidism of renal origin [facility])   Discharged Condition: good  Hospital Course: Patient was admitted for observation following parathyroid surgery.  Post op course was uncomplicated.  Pain was well controlled.  Tolerated diet.  Post op calcium level on morning following surgery was 9.7 mg/dl.  Patient was prepared for discharge home on POD#1.  Consults: nephrology  Treatments: surgery: parathyroidectomy  Discharge Exam: Blood pressure 124/75, pulse 78, temperature 97.7 F (36.5 C), resp. rate (!) 28, height 5\' 11"  (1.803 m), weight (!) 149.5 kg, SpO2 100%. HEENT - clear Neck - wound dry and intact; mild STS; voice normal  Disposition: Home  Discharge Instructions     Diet - low sodium heart healthy   Complete by: As directed    Increase activity slowly   Complete by: As directed    No dressing needed   Complete by: As directed       Allergies as of 10/13/2023       Reactions   Tape Other (See Comments)   PAPER TAPE, PLEASE!!   Chlorhexidine Itching, Rash, Other (See Comments)   Redness and irritation, also        Medication List     TAKE these medications    acetaminophen 500 MG tablet Commonly known as: TYLENOL Take 1,000 mg by mouth 2 (two) times daily as needed (pain or headaches).   cetirizine 10 MG tablet Commonly known as: ZYRTEC Take 10 mg by mouth daily as needed for allergies.   cinacalcet 90 MG tablet Commonly known as: SENSIPAR Take 90 mg by mouth daily.   fluticasone 50 MCG/ACT nasal spray Commonly known as: FLONASE Place 1-2 sprays into both nostrils as needed for allergies.   midodrine 10 MG tablet Commonly known as: PROAMATINE Take 10 mg by mouth See  admin instructions. Take 1 tablet 15 mg by mouth prior to dialysis and 1 tablet 15 mg half way through dialysis - Monday, Wednesday, Friday   oxyCODONE 5 MG immediate release tablet Commonly known as: Oxy IR/ROXICODONE Take 1 tablet (5 mg total) by mouth every 6 (six) hours as needed for moderate pain (pain score 4-6).   Velphoro 500 MG chewable tablet Generic drug: sucroferric oxyhydroxide Chew 1,500-2,000 mg by mouth 3 (three) times daily with meals.   ZANTAC 360 PO Take 1-2 tablets by mouth daily as needed (reflux).               Discharge Care Instructions  (From admission, onward)           Start     Ordered   10/13/23 0000  No dressing needed        10/13/23 1652            Follow-up Information     [name], MD. Schedule an appointment as soon as possible for a visit in 3 week(s).   Specialty: General Surgery Why: For wound re-check Contact information: [address] [place] [address] [phone]                 [name], MD [facility] Office: [phone]   Signed: [name] 10/13/2023, 4:52 PM

## 2023-10-13 NOTE — Plan of Care (Signed)

## 2023-10-13 NOTE — Progress Notes (Signed)
Patient discharged to home, AVS reviewed, IV removed. Patient's family member will be providing transportation and plan be here at 8:15 pm.

## 2023-10-13 NOTE — Procedures (Signed)
HD Note:  Some information was entered later than the data was gathered due to patient care needs. The stated time with the data is accurate.  Received patient in bed to unit.   Alert and oriented.   Informed consent signed and in chart.   Access used: Upper left arm fistula Access issues: Arterial pressures were consistently not WDL.  Adjusted positions.  BFR reduced to 300.  Patient tolerated treatment well.   The patient became impatient to be discharged and decided to end treatment early.  TX duration:  3 hours and 35 min  Alert, without acute distress.  Total UF removed: 2500  Hand-off given to patient's nurse.   Transported back to the room   Lyllie Cobbins L. Dareen Piano, RN Kidney Dialysis Unit.

## 2023-10-13 NOTE — Progress Notes (Signed)
[name] Progress Note   Subjective:   Seen at start of HD - no overnight issues - Ca stable. No CP/dyspnea.  Objective Vitals:   10/13/23 0850 10/13/23 1208 10/13/23 1259 10/13/23 1353  BP: 115/64 130/68 (!) 111/55 133/70  Pulse: 94 96 93 85  Resp: 18 18 19  (!) 23  Temp: 97.8 F (36.6 C) 98.1 F (36.7 C)  97.7 F (36.5 C)  TempSrc: Oral Oral    SpO2: 100% 99% 96% 98%  Weight:    (!) 149.5 kg  Height:       Physical Exam General: Well appearing man, NAD Neck: Glued horizontal incision Heart: RRR; no mur Lungs: CTAB Abdomen: soft Extremities: no LE edema Dialysis Access: LUE AVF +t/b  Additional Objective Labs: Basic Metabolic Panel: Recent Labs  Lab 10/06/23 1530 10/12/23 0725 10/12/23 1035 10/12/23 2203 10/13/23 0638 10/13/23 1055  NA 134* 132*  --   --   --   --   K 3.9 5.6*  --   --   --   --   CL 89* 98  --   --   --   --   CO2 30  --   --   --   --   --   GLUCOSE 88 92  --   --   --   --   BUN 14 36*  --   --   --   --   CREATININE 6.29* 9.70*  --   --   --   --   CALCIUM 9.1  --    < > 9.7 9.8 9.8   < > = values in this interval not displayed.   Liver Function Tests: No results for input(s): "AST", "ALT", "ALKPHOS", "BILITOT", "PROT", "ALBUMIN" in the last 168 hours. No results for input(s): "LIPASE", "AMYLASE" in the last 168 hours. CBC: Recent Labs  Lab 10/06/23 1530 10/12/23 0725  WBC 6.1  --   HGB 14.4 12.9*  HCT 41.5 38.0*  MCV 85.4  --   PLT 216  --    Blood Culture    Component Value Date/Time   SDES BLOOD RIGHT ARM 10/08/2018 1710   SPECREQUEST  10/08/2018 1710    BOTTLES DRAWN AEROBIC AND ANAEROBIC Blood Culture adequate volume   CULT  10/08/2018 1710    NO GROWTH 5 DAYS Performed at [facility], [address] [place], [place] [address]    REPTSTATUS 10/13/2018 FINAL 10/08/2018 1710   Medications:  dextrose 5 % and 0.9 % NaCl 10 mL/hr at 10/13/23 0054    doxercalciferol  4 mcg Intravenous Q  M,W,F-HD   heparin sodium (porcine)       heparin sodium (porcine)  10,000 Units Intravenous Once   midodrine  15 mg Oral Q M,W,F-HD   And   midodrine  15 mg Oral Q M,W,F-HD   sucroferric oxyhydroxide  1,000 mg Oral TID WC   sucroferric oxyhydroxide  1,500-2,000 mg Oral TID WC   Dialysis Orders: MWF - [place] 5:30hr, 450/A1.5, EDW 145.7kg, 2K/2Ca, UFP #2, AVF, 15g, heparin 10K bolus + 4000 mid-run bolus - no ESA, Hgb > 12 - Hectoral IV q HD   Assessment/Plan:  Secondary Hyperparathyroidism: S/p resection of 5th gland v. remnant 12/19 (prior total parathyroidectomy 2017). Ca has remained stable. Phos/PTH checked/pending. Continue Velphoro as binder but halving doses. Hyperkalemia: Lokelma given yesterday, HD today to correct.  ESRD:  Usual MWF - HD now, 4hr, 2K - ok for discharge  home after treatment.  BP/volume: BP stable, no edema on exam. Uses midodrine pre-HD.  Anemia: Hgb > 12, no ESA needed   Ozzie Hoyle, Cordelia Poche 10/13/2023, 1:58 PM  [employer]

## 2023-10-13 NOTE — Progress Notes (Signed)
    Assessment & Plan: POD#1 - status post neck exploration and parathyroidectomy  Calcium levels have remained stable post op  Patient doing well, tolerating diet, pain controlled  Plan HD today, then likely discharge home  Patient has done well. Will monitor PTH level next 1-2 months.  Anticipate discharge home today after hemodialysis.        [name], MD [facility] practice Office: [phone]        Chief Complaint: Secondary hyperparathyroidism  Subjective: Patient in bed, comfortable, tolerating diet  Objective: Vital signs in last 24 hours: Temp:  [97.7 F (36.5 C)-98.7 F (37.1 C)] 98.1 F (36.7 C) (12/20 1208) Pulse Rate:  [76-96] 96 (12/20 1208) Resp:  [17-22] 18 (12/20 1208) BP: (115-140)/(64-86) 130/68 (12/20 1208) SpO2:  [97 %-100 %] 99 % (12/20 1208) Last BM Date : 10/12/23  Intake/Output from previous day: 12/19 0701 - 12/20 0700 In: 740.9 [P.O.:560; I.V.:80.9; IV Piggyback:100] Out: 5 [Blood:5] Intake/Output this shift: Total I/O In: 240 [P.O.:240] Out: -   Physical Exam: HEENT - sclerae clear, mucous membranes moist Neck - wound dry and intact; mild STS; voice normal   Lab Results:  Recent Labs    10/12/23 0725  HGB 12.9*  HCT 38.0*   BMET Recent Labs    10/12/23 0725 10/12/23 1035 10/13/23 0638 10/13/23 1055  NA 132*  --   --   --   K 5.6*  --   --   --   CL 98  --   --   --   GLUCOSE 92  --   --   --   BUN 36*  --   --   --   CREATININE 9.70*  --   --   --   CALCIUM  --    < > 9.8 9.8   < > = values in this interval not displayed.   PT/INR No results for input(s): "LABPROT", "INR" in the last 72 hours. Comprehensive Metabolic Panel:    Component Value Date/Time   NA 132 (L) 10/12/2023 0725   NA 134 (L) 10/06/2023 1530   K 5.6 (H) 10/12/2023 0725   K 3.9 10/06/2023 1530   CL 98 10/12/2023 0725   CL 89 (L) 10/06/2023 1530   CO2 30 10/06/2023 1530   CO2 22 10/08/2018 1721   BUN 36 (H)  10/12/2023 0725   BUN 14 10/06/2023 1530   CREATININE 9.70 (H) 10/12/2023 0725   CREATININE 6.29 (H) 10/06/2023 1530   GLUCOSE 92 10/12/2023 0725   GLUCOSE 88 10/06/2023 1530   CALCIUM 9.8 10/13/2023 1055   CALCIUM 9.8 10/13/2023 0638   AST 14 (L) 10/08/2018 1721   AST 12 01/25/2014 0838   ALT 13 10/08/2018 1721   ALT 8 01/25/2014 0838   ALKPHOS 40 10/08/2018 1721   ALKPHOS 42 01/25/2014 0838   BILITOT 1.8 (H) 10/08/2018 1721   BILITOT 0.9 01/25/2014 0838   PROT 9.0 (H) 10/08/2018 1721   PROT 8.3 01/25/2014 0838   ALBUMIN 3.6 10/08/2018 1721   ALBUMIN 3.4 (L) 02/18/2016 0511    Studies/Results: No results found.    [name] 10/13/2023  Patient ID: Burton Apley, male   DOB: [birth year], 52 y.o.   MRN: [id number]

## 2023-10-13 NOTE — Discharge Planning (Signed)
[facility] Patient Discharge Orders - [facility] CLINIC: [place]  Patient's name: [name] Admit/DC Dates: 10/12/2023 - 10/13/23 (tentatively will be discharged after HD)  DISCHARGE DIAGNOSES: Secondary HPTH s/p remnant v. 5th parathyroid gland removal 10/12/23 Mild hyperkalemia - Lokelma given once to hold for HD  HD ORDER CHANGES: Heparin change: no EDW Change: no  Bath Change: no  ANEMIA MANAGEMENT: Aranesp: Given: no    ESA dose for discharge: per protocol IV Iron dose at discharge: per protocol Transfusion: Given: no  BONE/MINERAL MEDICATIONS: Hectorol/Calcitriol change: YES - Reduce Hectoral to IV q HD, then reduce further per protocol based on labs Sensipar/Parsabiv change: YES - d/c sensipar  ACCESS INTERVENTION/CHANGE: no Details:  RECENT LABS: Recent Labs  Lab 10/12/23 0725 10/12/23 1035 10/13/23 1055 10/13/23 1406  HGB 12.9*  --   --  12.1*  NA 132*  --   --   --   K 5.6*  --   --   --   CALCIUM  --    < > 9.8  --    < > = values in this interval not displayed.   IV ANTIBIOTICS: no Details:  OTHER ANTICOAGULATION: On Coumadin?: no  OTHER/APPTS/LAB ORDERS:  - Likely will be able to reduce Velphoro dose as well - Phos and PTH were pending at time of this note.  - Check Ca, Phos, and PTH weekly x 2  D/C Meds to be reconciled by nurse after every discharge.  Completed By: Ozzie Hoyle, PA-C [facility] Pager [phone]   Reviewed by: MD:______ RN_______

## 2023-10-13 NOTE — Plan of Care (Signed)
  Problem: Education: Goal: Knowledge of General Education information will improve Description: Including pain rating scale, medication(s)/side effects and non-pharmacologic comfort measures Outcome: Progressing   Problem: Clinical Measurements: Goal: Respiratory complications will improve Outcome: Progressing   Problem: Nutrition: Goal: Adequate nutrition will be maintained Outcome: Progressing   Problem: Coping: Goal: Level of anxiety will decrease Outcome: Progressing   Problem: Elimination: Goal: Will not experience complications related to bowel motility Outcome: Progressing   Problem: Pain Management: Goal: General experience of comfort will improve Outcome: Progressing   Problem: Skin Integrity: Goal: Risk for impaired skin integrity will decrease Outcome: Progressing

## 2023-10-13 NOTE — Progress Notes (Signed)
Pt receives out-pt HD at [facility] on MWF. Upon chart review, noted pt for possible d/c later today. Contacted [facility] to be made aware that pt may d/c later today therefore pt may be at treatment on Sunday (holiday schedule). Will assist as needed.   Olivia Canter Renal Navigator [phone]

## 2023-10-13 NOTE — Discharge Instructions (Signed)

## 2023-10-16 LAB — PARATHYROID HORMONE, INTACT (NO CA): PTH: 495 pg/mL — ABNORMAL HIGH (ref 15–65)

## 2023-10-16 NOTE — Progress Notes (Signed)
PTH of 495 is actually significantly improved from pre-op.  Will observe levels over next 2-3 months.  tmg  [name], MD [facility] practice Office: [phone]

## 2023-10-17 LAB — SURGICAL PATHOLOGY

## 2023-11-02 ENCOUNTER — Ambulatory Visit: Payer: Self-pay | Admitting: Surgery

## 2023-11-07 ENCOUNTER — Ambulatory Visit (HOSPITAL_BASED_OUTPATIENT_CLINIC_OR_DEPARTMENT_OTHER)
Admission: EM | Admit: 2023-11-07 | Discharge: 2023-11-07 | Disposition: A | Discharge status: Home / Self Care | Payer: Medicare PPO | Attending: Family Medicine | Admitting: Family Medicine

## 2023-11-07 ENCOUNTER — Ambulatory Visit (HOSPITAL_BASED_OUTPATIENT_CLINIC_OR_DEPARTMENT_OTHER)
Admission: RE | Admit: 2023-11-07 | Discharge: 2023-11-07 | Disposition: A | Discharge status: Home / Self Care | Payer: Medicare PPO | Source: Ambulatory Visit | Attending: Family Medicine | Admitting: Family Medicine

## 2023-11-07 ENCOUNTER — Encounter (HOSPITAL_BASED_OUTPATIENT_CLINIC_OR_DEPARTMENT_OTHER): Payer: Self-pay

## 2023-11-07 DIAGNOSIS — M25561 Pain in right knee: Secondary | ICD-10-CM | POA: Diagnosis present

## 2023-11-07 DIAGNOSIS — M25461 Effusion, right knee: Secondary | ICD-10-CM | POA: Insufficient documentation

## 2023-11-07 MED ORDER — METHYLPREDNISOLONE 4 MG PO TBPK: ORAL_TABLET | ORAL | 0 refills | Status: DC

## 2023-11-07 NOTE — ED Triage Notes (Signed)
 Right knee "tightness" x 1 week. States no injury. No long travels in car or by plane.  Reports "it's stinging behind the knee too".  Patient on dialysis M-W-F Reports pain 10/10. Has had no meds for pain today.

## 2023-11-07 NOTE — ED Provider Notes (Signed)
 [facility]    CSN: [id number] Arrival date & time: 11/07/23  0835      History   Chief Complaint Chief Complaint  Patient presents with   Knee Pain    HPI [name] is a 53 y.o. male.    Knee Pain Here for right knee pain and swelling  This began about 7 to 10 days ago.  No trauma or fall.  The pain is centered around his knee anterior and posterior.  It is swelling some anteriorly also.  He does not have any calf pain or tenderness and no new swelling in his lower right leg.  No fever or chills  Past medical history significant for end-stage renal disease, on dialysis.  No history of diabetes.    Past Medical History:  Diagnosis Date   Anemia    Iron deficiency   Aneurysm (HCC) 02/15/2007   Right arm large aneurysm   Constipation    End stage renal disease on dialysis [facility])    M- W-F   GERD (gastroesophageal reflux disease)    Hemodialysis finding May 2 ,2005   First HD at [facility]   History of bronchitis    Hypertension    Sickle cell trait [facility])     Patient Active Problem List   Diagnosis Date Noted   Secondary hyperparathyroidism of renal origin (HCC) 10/12/2023   Secondary hyperparathyroidism (HCC) 02/12/2016   Hyperparathyroidism, secondary (HCC) 02/10/2016   Closed fracture of left patella 08/24/2015   Quadriceps tendon rupture 08/24/2015   Non-healing surgical wound 07/10/2014   Fever in adult 09/10/2013   SIRS (systemic inflammatory response syndrome) (HCC) 09/10/2013   End stage renal disease (HCC) 05/03/2012   Other complications due to renal dialysis device, implant, and graft 05/03/2012    Past Surgical History:  Procedure Laterality Date   AV FISTULA PLACEMENT     AV FISTULA PLACEMENT Left 05/03/2013   Procedure: ARTERIOVENOUS (AV) FISTULA CREATION;  Surgeon: Krystal JULIANNA Doing, MD;  Location: [facility] OR;  Service: Vascular;  Laterality: Left;   BASCILIC VEIN TRANSPOSITION Left 11/28/2016   Procedure: BASCILIC VEIN  TRANSPOSITION- LEFT ARM;  Surgeon: Krystal JULIANNA Doing, MD;  Location: [facility] OR;  Service: Vascular;  Laterality: Left;   COLONOSCOPY     Hxl: of   colonscopy     FISTULA SUPERFICIALIZATION Left 07/24/2013   Procedure: FISTULA SUPERFICIALIZATION AND LIGATION OF BRANCHES;  Surgeon: Krystal JULIANNA Doing, MD;  Location: [facility] OR;  Service: Vascular;  Laterality: Left;   FRACTURE SURGERY Left Jul 28, 2015   Petela Tendon and Fx   INSERTION OF DIALYSIS CATHETER Right 04/22/2013   Procedure: INSERTION OF DIALYSIS CATHETER;  Surgeon: Krystal JULIANNA Doing, MD;  Location: [facility] OR;  Service: Vascular;  Laterality: Right;   IR DIALY SHUNT INTRO NEEDLE/INTRACATH INITIAL W/IMG LEFT Left 04/06/2023   IR US  GUIDE VASC ACCESS LEFT  04/06/2023   LIGATION OF ARTERIOVENOUS  FISTULA Right 04/22/2013   Procedure: LIGATION OF ARTERIOVENOUS  FISTULA  AND RESECTION OF VENOUS ANEURYSM AND ABSCESS;  Surgeon: Krystal JULIANNA Doing, MD;  Location: [facility] OR;  Service: Vascular;  Laterality: Right;   PARATHYROIDECTOMY N/A 02/12/2016   Procedure:  TOTAL PARATHYROIDECTOMY AUTOTRANSPLANT TO RIGHT FOREARM ;  Surgeon: [name], MD;  Location: [facility] OR;  Service: General;  Laterality: N/A;   PARATHYROIDECTOMY N/A 10/12/2023   Procedure: NECK EXPLORATION WITH PARATHYROIDECTOMY;  Surgeon: Spinner Krystal, MD;  Location: [facility] OR;  Service: General;  Laterality: N/A;   QUADRICEPS TENDON REPAIR  Left 08/24/2015   Procedure: REPAIR OF LEFT QUADRICEP TENDON;  Surgeon: Dempsey Sensor, MD;  Location: [facility] OR;  Service: Orthopedics;  Laterality: Left;   REMOVAL OF A DIALYSIS CATHETER  2014   TEE WITHOUT CARDIOVERSION N/A 09/13/2013   Procedure: TRANSESOPHAGEAL ECHOCARDIOGRAM (TEE);  Surgeon: Jerel Balding, MD;  Location: [facility] ENDOSCOPY;  Service: Cardiovascular;  Laterality: N/A;       Home Medications    Prior to Admission medications   Medication Sig Start Date End Date Taking? Authorizing Provider  methylPREDNISolone  (MEDROL  DOSEPAK) 4 MG TBPK tablet Take as per package instructions  11/07/23  Yes Sharai Overbay, Sharlet POUR, MD  acetaminophen  (TYLENOL ) 500 MG tablet Take 1,000 mg by mouth 2 (two) times daily as needed (pain or headaches).    [provider]  cetirizine (ZYRTEC) 10 MG tablet Take 10 mg by mouth daily as needed for allergies. 05/08/23   [provider]  cinacalcet  (SENSIPAR ) 90 MG tablet Take 90 mg by mouth daily.    [provider]  Famotidine  (ZANTAC 360 PO) Take 1-2 tablets by mouth daily as needed (reflux).    [provider]  fluticasone (FLONASE) 50 MCG/ACT nasal spray Place 1-2 sprays into both nostrils as needed for allergies. 07/13/20   [provider]  midodrine  (PROAMATINE ) 10 MG tablet Take 10 mg by mouth See admin instructions. Take 1 tablet 15 mg by mouth prior to dialysis and 1 tablet 15 mg half way through dialysis - Monday, Wednesday, Friday    [provider]  oxyCODONE  (OXY IR/ROXICODONE ) 5 MG immediate release tablet Take 1 tablet (5 mg total) by mouth every 6 (six) hours as needed for moderate pain (pain score 4-6). 10/13/23   Eletha Boas, MD  VELPHORO  500 MG chewable tablet Chew 1,500-2,000 mg by mouth 3 (three) times daily with meals. 06/19/23   [provider]    Family History Family History  Problem Relation Age of Onset   Hypertension Mother    Heart disease Mother        before age 13   Other Brother        DVT    Social History Social History   Tobacco Use   Smoking status: Light Smoker    Types: Cigars   Smokeless tobacco: Never   Tobacco comments:    pt states he smokes about 2 cigars per year  Substance Use Topics   Alcohol use: Yes    Comment: rarely   Drug use: No     Allergies   Tape and Chlorhexidine    Review of Systems Review of Systems   Physical Exam Triage Vital Signs ED Triage Vitals  Encounter Vitals Group     BP 11/07/23 0845 (!) 176/90     Systolic BP Percentile --      Diastolic BP Percentile --      Pulse Rate 11/07/23 0845 98      Resp 11/07/23 0845 20     Temp 11/07/23 0843 98.3 F (36.8 C)     Temp Source 11/07/23 0843 Oral     SpO2 11/07/23 0845 97 %     Weight 11/07/23 0845 (!) 319 lb (144.7 kg)     Height --      Head Circumference --      Peak Flow --      Pain Score 11/07/23 0845 10     Pain Loc --      Pain Education --      Exclude from Growth Chart --  No data found.  Updated Vital Signs BP (!) 176/90 (BP Location: Right Arm)   Pulse 98   Temp 98.3 F (36.8 C) (Oral)   Resp 20   Wt (!) 144.7 kg   SpO2 97%   BMI 44.49 kg/m   Visual Acuity Right Eye Distance:   Left Eye Distance:   Bilateral Distance:    Right Eye Near:   Left Eye Near:    Bilateral Near:     Physical Exam Constitutional:      General: He is not in acute distress.    Appearance: He is not ill-appearing, toxic-appearing or diaphoretic.  Musculoskeletal:     Comments: There is some swelling of the anterior right knee.  It is mildly tender anteriorly and in the popliteal fossa.  No masses noted in the popliteal fossa.  There is no calf tenderness and negative Homans on the right leg.  There is trace edema in the right ankle.  Neurological:     Mental Status: He is alert.      UC Treatments / Results  Labs (all labs ordered are listed, but only abnormal results are displayed) Labs Reviewed - No data to display  EKG   Radiology No results found.  Procedures Procedures (including critical care time)  Medications Ordered in UC Medications - No data to display  Initial Impression / Assessment and Plan / UC Course  I have reviewed the triage vital signs and the nursing notes.  Pertinent labs & imaging results that were available during my care of the patient were reviewed by me and considered in my medical decision making (see chart for details).     I do not think that this is consistent with DVT.  Steroid pack is sent in and he already has oxycodone  at home to take if he needs something stronger for  pain.  We discussed recommended doses of Tylenol  also.  X-rays ordered for him to do at [facility].  We do not have x-ray in the building yet.  Will notify him if there is any significant abnormality.  He is given contact information for primary care here in [place] and for orthopedics. Final Clinical Impressions(s) / UC Diagnoses   Final diagnoses:  Acute pain of right knee     Discharge Instructions      Take the Medrol  Dosepak as instructed in the package.  You can take the oxycodone  you still have as needed for the pain.   Maximum dose of Tylenol /acetaminophen  is 500 mg--2 tablets every 6 hours as needed for pain.  Please get your x-ray done at the [facility] health med center at [facility].  It addresses [address] in [place].  They are open 7-5 30 on weekdays.  Our staff should call you if there is a significant abnormality that needs different management.  If you are worse or your pain is not relieved at all, please go to the emergency room for further evaluation       ED Prescriptions     Medication Sig Dispense Auth. Provider   methylPREDNISolone  (MEDROL  DOSEPAK) 4 MG TBPK tablet Take as per package instructions 1 each Vonna, Jeneen Doutt K, MD      I have reviewed the PDMP during this encounter.   Vonna Sharlet POUR, MD 11/07/23 [phone]

## 2023-11-07 NOTE — Discharge Instructions (Addendum)
 Take the Medrol  Dosepak as instructed in the package.  You can take the oxycodone  you still have as needed for the pain.   Maximum dose of Tylenol /acetaminophen  is 500 mg--2 tablets every 6 hours as needed for pain.  Please get your x-ray done at the [facility] health med center at [facility].  It addresses [address] in [place].  They are open 7-5 30 on weekdays.  Our staff should call you if there is a significant abnormality that needs different management.  If you are worse or your pain is not relieved at all, please go to the emergency room for further evaluation

## 2023-11-29 ENCOUNTER — Encounter (HOSPITAL_COMMUNITY): Payer: Self-pay | Admitting: Surgery

## 2023-11-29 NOTE — H&P (Signed)
 PROVIDER: Jniya Madara [name], MD   Chief Complaint: Follow-up (Recurrent secondary hyperparathyroidism)  History of Present Illness:  Patient returns to review results of diagnostic studies for evaluation of recurrent secondary hyperparathyroidism. And has had intact PTH levels greater than 1000. Phosphorus levels have been elevated up to 8.6. Calcium  level remains in the normal range at 9.8. At my request the patient underwent a nuclear medicine parathyroid  scan. This indicated uptake in the left inferior neck as well as in the right forearm at the site of his parathyroid  transplant grafts. Patient subsequently underwent a 4D CT scan of the neck on June 08, 2023. This demonstrated an 18 mm possible parathyroid  lesion inferior to the lower pole of the left thyroid lobe and anterior to the tracheoesophageal groove. This most likely is in the thyroid thymic tract. This could represent an ectopic or extranumery parathyroid  gland. Review of his operative report from 2017 demonstrates that 4 parathyroid  glands were removed at the time of total parathyroidectomy, all of which were confirmed histologically.   Review of Systems: A complete review of systems was obtained from the patient. I have reviewed this information and discussed as appropriate with the patient. See HPI as well for other ROS.  Review of Systems  Constitutional: Negative.  HENT: Negative.  Eyes: Negative.  Respiratory: Negative.  Cardiovascular: Negative.  Gastrointestinal: Negative.  Genitourinary: Negative.  Musculoskeletal: Negative.  Skin: Negative.  Neurological: Negative.  Endo/Heme/Allergies: Negative.  Psychiatric/Behavioral: Negative.    Medical History: Past Medical History:  Diagnosis Date  Anemia  Aneurysm (CMS-HCC)  Arthritis  Chronic kidney disease  GERD (gastroesophageal reflux disease)  Hypertension   Patient Active Problem List  Diagnosis  Secondary hyperparathyroidism of renal origin  (CMS/HHS-HCC)   Past Surgical History:  Procedure Laterality Date  REMOVAL OF A DIALYSIS CATHETER 2014  LIGATION OF ARTERIOVENOUS FISTULA Right 04/22/2013  Insertion of Dialysis Catheter  AV FISTULA PLACEMENT Left 05/03/2013  TEE W/O CARDIOVERSION 09/13/2013  PARATHYROIDECTOMY 02/12/2016  BASCILIC VEIN TRANSPOSITION SURGERY Left 12/18/2016  IR DIALY SHUT INTRO NEEDLE/INTRA INITIAL Left 04/06/2023  IR us  Guide Vasc. access  COLONOSCOPY  FISTULA SUPERFICIALIZATION Left    Allergies  Allergen Reactions  Chlorhexidine  Itching, Other (See Comments) and Rash  Redness and irritation, also  Adhesive Other (See Comments)  PAPER TAPE, PLEASE!!   Current Outpatient Medications on File Prior to Visit  Medication Sig Dispense Refill  cinacalcet  (SENSIPAR ) 90 MG tablet Take by mouth  midodrine  (PROAMATINE ) 10 MG tablet Take by mouth  omeprazole  (PRILOSEC) 10 MG DR capsule Take 10 mg by mouth once daily  vit B,C-FA-zinc -selen-vit D3-E (RENAPLEX-D) 800 mcg-12.5 mg -2,000 unit Tab Take by mouth   No current facility-administered medications on file prior to visit.   History reviewed. No pertinent family history.   Social History   Tobacco Use  Smoking Status Every Day  Types: Cigars  Smokeless Tobacco Never    Social History   Socioeconomic History  Marital status: Single  Tobacco Use  Smoking status: Every Day  Types: Cigars  Smokeless tobacco: Never  Substance and Sexual Activity  Alcohol use: Yes  Drug use: Never   Objective:   Vitals:  PainSc: 0-No pain   There is no height or weight on file to calculate BMI.  Physical Exam   Anterior cervical incision is well-healed. No palpable abnormalities. Voice quality is normal.  Right forearm incision is well-healed. No palpable abnormalities.   Assessment and Plan:   Secondary hyperparathyroidism of renal origin (CMS/HHS-HCC)  Today we discussed a surgical strategy to address his recurrent secondary  hyperparathyroidism. Patient may have an extranumery and ectopic parathyroid  gland in the left inferior position. This could also represent a lymph node or ectopic thyroid tissue. I do not think additional imaging or a biopsy would be worthwhile. Therefore I have recommended exploring this area with the intention of resecting the 18 mm mass and performing a frozen section biopsy intraoperatively. If this is a hypercellular parathyroid  gland, then I think we would not open the forearm or bother any of the parathyroid  implants at this time. We would wait and follow his laboratory studies to see if this resolves the recurrent secondary hyperparathyroidism. If the mass in the left inferior neck does not represent parathyroid  tissue, then we would plan to open the right forearm surgical site and removed any hyperplastic parathyroid  grafts at the time of this procedure.  Patient and I discussed the strategy. He is in agreement. We will plan on an overnight stay at [facility]. He dialyzes on Mondays Wednesdays and Fridays. Hopefully he would be able to go home following dialysis on the morning after surgery.  [name], MD [name]: [phone]   ADDENDUM:  PROVIDER: [name], MD  Patient returns for postoperative visit having undergone neck exploration and resection of a parathyroid  gland measuring 1.7 cm in size. Pathology shows hypercellular parathyroid  tissue. This weighed 1107 mg. Laboratory studies however continued to show an elevated intact PTH level between 850 and 880. Levels have not significantly decreased since removal of the parathyroid  tissue from the neck.  Physical Examination:   Anterior cervical incision is healing nicely. Minimal soft tissue swelling. Voice quality is normal.  Assessment and Plan:   Status post parathyroidectomy  Patient has done well following neck exploration and parathyroidectomy.  Unfortunately his parathyroid  hormone levels remain elevated. He does have implants in the right brachial radialis muscle. This will need to be explored and any hypercellular grafts will need to be removed. We discussed this today. We would plan on an overnight hospital stay at least for monitoring of his calcium  levels and for probable inpatient dialysis.  Patient would like to proceed with this in the immediate future. He does not want to begin taking Sensipar . He experiences gastrointestinal side effects.  [name], MD [facility] practice [place]: [phone]

## 2023-11-29 NOTE — Pre-Procedure Instructions (Addendum)
 Surgical Instructions   Your procedure is scheduled on December 05, 2023. Report to [facility] Main Entrance A at 8:30 A.M., then check in with the Admitting office. Any questions or running late day of surgery: call [phone]  Questions prior to your surgery date: call [phone], Monday-Friday, 8am-4pm. If you experience any cold or flu symptoms such as cough, fever, chills, shortness of breath, etc. between now and your scheduled surgery, please notify us  at the above number.     Remember:  Do not eat after midnight the night before your surgery   You may drink clear liquids until 7:30 AM the morning of your surgery.   Clear liquids allowed are: Water , Non-Citrus Juices (without pulp), Carbonated Beverages, Clear Tea (no milk, honey, etc.), Black Coffee Only (NO MILK, CREAM OR POWDERED CREAMER of any kind), and Gatorade.    Take these medicines the morning of surgery with A SIP OF WATER : cinacalcet  (SENSIPAR )    May take these medicines IF NEEDED: acetaminophen  (TYLENOL )  cetirizine (ZYRTEC)  Famotidine  (ZANTAC 360 PO)  fluticasone (FLONASE) nasal spray  lactulose, encephalopathy, (CHRONULAC)  oxyCODONE  (OXY IR/ROXICODONE )    One week prior to surgery, STOP taking any Aspirin  (unless otherwise instructed by your surgeon) Aleve, Naproxen, Ibuprofen, Motrin, Advil, Goody's, BC's, all herbal medications, fish oil, and non-prescription vitamins.                     Do NOT Smoke (Tobacco/Vaping) for 24 hours prior to your procedure.  If you use a CPAP at night, you may bring your mask/headgear for your overnight stay.   You will be asked to remove any contacts, glasses, piercing's, hearing aid's, dentures/partials prior to surgery. Please bring cases for these items if needed.    Patients discharged the day of surgery will not be allowed to drive home, and someone needs to stay with them for 24 hours.  SURGICAL WAITING ROOM VISITATION Patients may have no more than 2  support people in the waiting area - these visitors may rotate.   Pre-op  nurse will coordinate an appropriate time for 1 ADULT support person, who may not rotate, to accompany patient in pre-op .  Children under the age of 53 must have an adult with them who is not the patient and must remain in the main waiting area with an adult.  If the patient needs to stay at the hospital during part of their recovery, the visitor guidelines for inpatient rooms apply.  Please refer to the [facility] website for the visitor guidelines for any additional information.   If you received a COVID test during your pre-op  visit  it is requested that you wear a mask when out in public, stay away from anyone that may not be feeling well and notify your surgeon if you develop symptoms. If you have been in contact with anyone that has tested positive in the last 10 days please notify you surgeon.      Pre-operative CHG Bathing Instructions   You can play a key role in reducing the risk of infection after surgery. Your skin needs to be as free of germs as possible. You can reduce the number of germs on your skin by washing with CHG (chlorhexidine  gluconate) soap before surgery. CHG is an antiseptic soap that kills germs and continues to kill germs even after washing.   DO NOT use if you have an allergy to chlorhexidine /CHG or antibacterial soaps. If your skin becomes reddened or irritated, stop using the  CHG and notify one of our RNs at [phone].              TAKE A SHOWER THE NIGHT BEFORE SURGERY AND THE DAY OF SURGERY    Please keep in mind the following:  DO NOT shave, including legs and underarms, 48 hours prior to surgery.   You may shave your face before/day of surgery.  Place clean sheets on your bed the night before surgery Use a clean washcloth (not used since being washed) for each shower. DO NOT sleep with pet's night before surgery.  CHG Shower Instructions:  Wash your face and private area with  normal soap. If you choose to wash your hair, wash first with your normal shampoo.  After you use shampoo/soap, rinse your hair and body thoroughly to remove shampoo/soap residue.  Turn the water  OFF and apply half the bottle of CHG soap to a CLEAN washcloth.  Apply CHG soap ONLY FROM YOUR NECK DOWN TO YOUR TOES (washing for 3-5 minutes)  DO NOT use CHG soap on face, private areas, open wounds, or sores.  Pay special attention to the area where your surgery is being performed.  If you are having back surgery, having someone wash your back for you may be helpful. Wait 2 minutes after CHG soap is applied, then you may rinse off the CHG soap.  Pat dry with a clean towel  Put on clean pajamas    Additional instructions for the day of surgery: DO NOT APPLY any lotions, deodorants, cologne, or perfumes.   Do not wear jewelry or makeup Do not wear nail polish, gel polish, artificial nails, or any other type of covering on natural nails (fingers and toes) Do not bring valuables to the hospital. [facility] is not responsible for valuables/personal belongings. Put on clean/comfortable clothes.  Please brush your teeth.  Ask your nurse before applying any prescription medications to the skin.

## 2023-11-30 ENCOUNTER — Other Ambulatory Visit: Payer: Self-pay

## 2023-11-30 ENCOUNTER — Encounter (HOSPITAL_COMMUNITY)
Admission: RE | Admit: 2023-11-30 | Discharge: 2023-11-30 | Disposition: A | Discharge status: Home / Self Care | Payer: Medicare PPO | Source: Ambulatory Visit | Attending: Surgery | Admitting: Surgery

## 2023-11-30 ENCOUNTER — Encounter (HOSPITAL_COMMUNITY): Payer: Self-pay

## 2023-11-30 VITALS — BP 135/75 | HR 96 | Temp 98.2°F | Resp 20 | Ht 71.0 in | Wt 313.5 lb

## 2023-11-30 DIAGNOSIS — N289 Disorder of kidney and ureter, unspecified: Secondary | ICD-10-CM

## 2023-11-30 NOTE — Progress Notes (Signed)
 PCP - Dr. Gordy Blanch Cardiologist - Dr. Lynwood Nolene Raddle - Last office visit 03/26/2019 but has not followed up since. Original visit was for hypotension and midodrine  was prescribed prior to HD sessions. Pt has not needed his midodrine  in the last four months due to appropriate BPs during HD. Discussed with Lynwood Hope, PA-C  PPM/ICD - Denies Device Orders - n/a Rep Notified - n/a  Chest x-ray - n/a EKG - 10/06/2023 Stress Test - 10/09/2012 - [facility] ECHO - 04/09/2019 - [facility] Cardiac Cath - Denies  Sleep Study - Denies - STOPBANG result 5 CPAP - n/a  No DM  Last dose of GLP1 agonist- n/a GLP1 instructions: n/a  Blood Thinner Instructions: n/a Aspirin  Instructions: n/a  ERAS Protcol - Clear liquids until 0730 morning of surgery PRE-SURGERY Ensure or G2- n/a  COVID TEST- n/a   Anesthesia review: Yes. Hx of ESRD (HD MWF), Hypotension, STOPBANG 5. Pt does plan to have HD completed the Monday 2/10 prior to surgery on Tuesday.  Patient denies shortness of breath, fever, cough and chest pain at PAT appointment. Pt denies any respiratory illness/infection in the last two months.    All instructions explained to the patient, with a verbal understanding of the material. Patient agrees to go over the instructions while at home for a better understanding. Patient also instructed to self quarantine after being tested for COVID-19. The opportunity to ask questions was provided.

## 2023-12-01 NOTE — Anesthesia Preprocedure Evaluation (Addendum)
Anesthesia Evaluation  Patient identified by MRN, date of birth, ID band Patient awake    Reviewed: Allergy & Precautions, H&P , NPO status , Patient's Chart, lab work & pertinent test results  Airway Mallampati: II  TM Distance: >3 FB Neck ROM: Full    Dental no notable dental hx. (+) Teeth Intact, Dental Advisory Given   Pulmonary Current Smoker   Pulmonary exam normal breath sounds clear to auscultation       Cardiovascular hypertension, Pt. on medications  Rhythm:Regular Rate:Normal     Neuro/Psych negative neurological ROS  negative psych ROS   GI/Hepatic Neg liver ROS,GERD  Medicated,,  Endo/Other    Class 3 obesity  Renal/GU ESRF and DialysisRenal diseasenegative Renal ROS  negative genitourinary   Musculoskeletal   Abdominal   Peds  Hematology  (+) Blood dyscrasia, anemia   Anesthesia Other Findings   Reproductive/Obstetrics negative OB ROS                             Anesthesia Physical Anesthesia Plan  ASA: 3  Anesthesia Plan: General   Post-op Pain Management: Tylenol PO (pre-op)*   Induction: Intravenous  PONV Risk Score and Plan: 2 and Ondansetron, Dexamethasone and Midazolam  Airway Management Planned: Oral ETT and Video Laryngoscope Planned  Additional Equipment:   Intra-op Plan:   Post-operative Plan: Extubation in OR  Informed Consent: I have reviewed the patients History and Physical, chart, labs and discussed the procedure including the risks, benefits and alternatives for the proposed anesthesia with the patient or authorized representative who has indicated his/her understanding and acceptance.     Dental advisory given  Plan Discussed with: CRNA  Anesthesia Plan Comments: (PAT note written 12/01/2023 by Shonna Chock, PA-C.  )       Anesthesia Quick Evaluation

## 2023-12-01 NOTE — Progress Notes (Signed)
 Anesthesia Chart Review:  Case: [id number] Date/Time: 12/05/23 1015   Procedure: EXCISION OF PARATHYROID  GRAFTS FROM RIGHT RIGHT FOREARM (Right)   Anesthesia type: General   Pre-op  diagnosis: RECURRENT SECONDARY HYPEPARATHYROIDISM   [facility] / [facility]   Surgeons: Eletha Boas, MD       DISCUSSION: Patient is a 53 year old male scheduled for the above procedure. S/p neck exploration with resection of left inferior hypercellular parathyroid  gland on 10/12/23. Per H&P,  Unfortunately his parathyroid  hormone levels remain elevated. He does have implants in the right brachial radialis muscle. This will need to be explored and any hypercellular grafts will need to be removed. Overnight hospital stay for monitoring of his calcium  levels and for probable inpatient dialysis is anticipated.    History includes smoking, HTN, ESRD (left arm basilic vein transposition AVF 11/28/16), anemia, GERD, sickle cell trait, secondary hyperparathyroidism (s/p total parathyroidectomy, autotransplant right brachioradialis muscle 02/12/16; resection of left inferior parathyroid  gland 10/12/23).  BMI is consistent with morbid obesity.    He has hemodialysis on MWF at the [facility]. He does have a history of hypotension with dialysis and is on midodrine  15 mg prior to dialysis and half way through dialysis; However, reported that he has not required midodrine  at HD for the past 4 months. BP 135/75 at PAT.   Anesthesia team to evaluate on the day of surgery. Labs on arrival. He is planning to have HD on 12/04/23 for 12/05/23 surgery. .     VS: BP 135/75   Pulse 96   Temp 36.8 C (Oral)   Resp 20   Ht 5' 11 (1.803 m)   Wt (!) 142.2 kg   SpO2 100%   BMI 43.72 kg/m    PROVIDERS: Tobie Gordy POUR, MD is PCP/nephrologist - He is not followed routinely by cardiology. He had evaluation by Nolene Agent, MD in 2020 for hypotension associated with HD--previously had been high. Was using midodrine  as  needed with HD. Echo ordered to assess LVF and done on 04/09/19 showing LVEF 60-65%, moderate LVH. He was asked to increase salt intake, consider support hose, and discuss with nephrology if any adjustment in volume being taking off during HD should be considered.   LABS: For day of surgery.  Per [facility] labs (See CE): As of 11/22/23, H/H 31.2/29.6, Total calcium  9.2 on 11/13/23, PTH-intact 1482 on 11/27/23.    IMAGES: CT parathyroid  06/08/23: IMPRESSION: 18 mm possible parathyroid  lesion inferior to the lower pole of the left thyroid lobe, anterior to the tracheoesophageal groove.   NM Parathyroid  Scan 05/11/23: IMPRESSION: 1. Mild residual activity in the lower pole of the LEFT lobe of the thyroid gland. Indeterminate findings. 2. Focal activity in the RIGHT forearm consistent with transplanted parathyroid  gland.     EKG: 10/06/23: NSR     CV: TTE 04/09/19 ([facility]): Summary: Normal left ventricular systolic function  Ejection fraction is visually estimated at 60-65%  Moderate concentric left ventricular hypertrophy  Diastolic function is indeterminate  Mild pulmonic regurgitation.      TEE 09/13/13 (to evaluate for endocarditis): Study Conclusions  - Left ventricle: Systolic function was normal. The    estimated ejection fraction was in the range of 60% to    65%. Wall motion was normal; there were no regional wall    motion abnormalities. There was an increased relative    contribution of atrial contraction to ventricular filling,    which may be due to hypovolemia.  -  Left atrium: No evidence of thrombus in the atrial cavity    or appendage.  - Right atrium: No evidence of thrombus in the atrial cavity    or appendage.  - Atrial septum: No defect or patent foramen ovale was    identified.  - No evidence of endocarditis     Stress Echo 10/09/12 ([facility]): Interpretation: Normal Stress Echocardiogram.    Past Medical History:  Diagnosis Date   Anemia    Iron  deficiency   Aneurysm (HCC) 02/15/2007   Right arm large aneurysm   Constipation    End stage renal disease on dialysis [facility])    M-W-F   GERD (gastroesophageal reflux disease)    Hemodialysis finding 02/23/2004   First HD at [facility].   History of bronchitis    Hypertension    Sickle cell trait [facility])     Past Surgical History:  Procedure Laterality Date   AV FISTULA PLACEMENT     AV FISTULA PLACEMENT Left 05/03/2013   Procedure: ARTERIOVENOUS (AV) FISTULA CREATION;  Surgeon: Krystal JULIANNA Doing, MD;  Location: [facility] OR;  Service: Vascular;  Laterality: Left;   BASCILIC VEIN TRANSPOSITION Left 11/28/2016   Procedure: BASCILIC VEIN TRANSPOSITION- LEFT ARM;  Surgeon: Krystal JULIANNA Doing, MD;  Location: [facility] OR;  Service: Vascular;  Laterality: Left;   COLONOSCOPY  2022   FISTULA SUPERFICIALIZATION Left 07/24/2013   Procedure: FISTULA SUPERFICIALIZATION AND LIGATION OF BRANCHES;  Surgeon: Krystal JULIANNA Doing, MD;  Location[facility] OR;  Service: Vascular;  Laterality: Left;   FRACTURE SURGERY Left 07/28/2015   Petela Tendon and Fx   INSERTION OF DIALYSIS CATHETER Right 04/22/2013   Procedure: INSERTION OF DIALYSIS CATHETER;  Surgeon: Krystal JULIANNA Doing, MD;  Location: [facility] OR;  Service: Vascular;  Laterality: Right;   IR DIALY SHUNT INTRO NEEDLE/INTRACATH INITIAL W/IMG LEFT Left 04/06/2023   IR US  GUIDE VASC ACCESS LEFT  04/06/2023   LIGATION OF ARTERIOVENOUS  FISTULA Right 04/22/2013   Procedure: LIGATION OF ARTERIOVENOUS  FISTULA  AND RESECTION OF VENOUS ANEURYSM AND ABSCESS;  Surgeon: Krystal JULIANNA Doing, MD;  Location: [facility] OR;  Service: Vascular;  Laterality: Right;   PARATHYROIDECTOMY N/A 02/12/2016   Procedure:  TOTAL PARATHYROIDECTOMY AUTOTRANSPLANT TO RIGHT FOREARM ;  Surgeon: [name], MD;  Location: [facility] OR;  Service: General;  Laterality: N/A;   PARATHYROIDECTOMY N/A 10/12/2023   Procedure: NECK EXPLORATION WITH PARATHYROIDECTOMY;  Surgeon: Spinner Krystal, MD;  Location[facility] OR;  Service: General;  Laterality: N/A;    QUADRICEPS TENDON REPAIR Left 08/24/2015   Procedure: REPAIR OF LEFT QUADRICEP TENDON;  Surgeon: Dempsey Sensor, MD;  Location: [facility] OR;  Service: Orthopedics;  Laterality: Left;   REMOVAL OF A DIALYSIS CATHETER  10/24/2012   TEE WITHOUT CARDIOVERSION N/A 09/13/2013   Procedure: TRANSESOPHAGEAL ECHOCARDIOGRAM (TEE);  Surgeon: Jerel Balding, MD;  Location: [facility] ENDOSCOPY;  Service: Cardiovascular;  Laterality: N/A;    MEDICATIONS:  acetaminophen  (TYLENOL ) 500 MG tablet   cetirizine (ZYRTEC) 10 MG tablet   Famotidine  (ZANTAC 360 PO)   fluticasone (FLONASE) 50 MCG/ACT nasal spray   lactulose, encephalopathy, (CHRONULAC) 10 GM/15ML SOLN   midodrine  (PROAMATINE ) 10 MG tablet   oxyCODONE  (OXY IR/ROXICODONE ) 5 MG immediate release tablet   SENSIPAR  30 MG tablet   VELPHORO  500 MG chewable tablet   No current facility-administered medications for this encounter.    Isaiah Ruder, PA-C Surgical Short Stay/Anesthesiology [facility] Phone [phone] [facility] Phone [phone] 12/01/2023 1:11 PM

## 2023-12-05 ENCOUNTER — Encounter (HOSPITAL_COMMUNITY)
Admission: RE | Disposition: A | Discharge status: Home / Self Care | Payer: Self-pay | Source: Home / Self Care | Attending: Surgery

## 2023-12-05 ENCOUNTER — Ambulatory Visit (HOSPITAL_COMMUNITY): Payer: Self-pay | Admitting: Physician Assistant

## 2023-12-05 ENCOUNTER — Ambulatory Visit (HOSPITAL_BASED_OUTPATIENT_CLINIC_OR_DEPARTMENT_OTHER): Payer: Medicare PPO

## 2023-12-05 ENCOUNTER — Ambulatory Visit (HOSPITAL_COMMUNITY)
Admission: RE | Admit: 2023-12-05 | Discharge: 2023-12-07 | Disposition: A | Discharge status: Home / Self Care | Payer: Medicare PPO | Attending: Surgery | Admitting: Surgery

## 2023-12-05 ENCOUNTER — Encounter (HOSPITAL_COMMUNITY): Payer: Self-pay | Admitting: Surgery

## 2023-12-05 ENCOUNTER — Other Ambulatory Visit: Payer: Self-pay

## 2023-12-05 DIAGNOSIS — E211 Secondary hyperparathyroidism, not elsewhere classified: Secondary | ICD-10-CM

## 2023-12-05 DIAGNOSIS — Z6841 Body Mass Index (BMI) 40.0 and over, adult: Secondary | ICD-10-CM | POA: Insufficient documentation

## 2023-12-05 DIAGNOSIS — F17210 Nicotine dependence, cigarettes, uncomplicated: Secondary | ICD-10-CM

## 2023-12-05 DIAGNOSIS — Z79899 Other long term (current) drug therapy: Secondary | ICD-10-CM | POA: Diagnosis not present

## 2023-12-05 DIAGNOSIS — K219 Gastro-esophageal reflux disease without esophagitis: Secondary | ICD-10-CM | POA: Insufficient documentation

## 2023-12-05 DIAGNOSIS — N2581 Secondary hyperparathyroidism of renal origin: Secondary | ICD-10-CM | POA: Diagnosis present

## 2023-12-05 DIAGNOSIS — Z992 Dependence on renal dialysis: Secondary | ICD-10-CM | POA: Diagnosis not present

## 2023-12-05 DIAGNOSIS — E66813 Obesity, class 3: Secondary | ICD-10-CM | POA: Diagnosis not present

## 2023-12-05 DIAGNOSIS — N186 End stage renal disease: Secondary | ICD-10-CM | POA: Diagnosis not present

## 2023-12-05 DIAGNOSIS — F17290 Nicotine dependence, other tobacco product, uncomplicated: Secondary | ICD-10-CM | POA: Insufficient documentation

## 2023-12-05 DIAGNOSIS — I120 Hypertensive chronic kidney disease with stage 5 chronic kidney disease or end stage renal disease: Secondary | ICD-10-CM | POA: Diagnosis not present

## 2023-12-05 DIAGNOSIS — N289 Disorder of kidney and ureter, unspecified: Secondary | ICD-10-CM

## 2023-12-05 HISTORY — PX: PARATHYROIDECTOMY: SHX19

## 2023-12-05 LAB — POCT I-STAT, CHEM 8
BUN: 17 mg/dL (ref 6–20)
Calcium, Ion: 1.07 mmol/L — ABNORMAL LOW (ref 1.15–1.40)
Chloride: 93 mmol/L — ABNORMAL LOW (ref 98–111)
Creatinine, Ser: 9.9 mg/dL — ABNORMAL HIGH (ref 0.61–1.24)
Glucose, Bld: 84 mg/dL (ref 70–99)
HCT: 37 % — ABNORMAL LOW (ref 39.0–52.0)
Hemoglobin: 12.6 g/dL — ABNORMAL LOW (ref 13.0–17.0)
Potassium: 3.7 mmol/L (ref 3.5–5.1)
Sodium: 136 mmol/L (ref 135–145)
TCO2: 30 mmol/L (ref 22–32)

## 2023-12-05 LAB — CALCIUM: Calcium: 9.3 mg/dL (ref 8.9–10.3)

## 2023-12-05 LAB — HEPATITIS B SURFACE ANTIGEN: Hepatitis B Surface Ag: NONREACTIVE

## 2023-12-05 SURGERY — PARATHYROIDECTOMY
Anesthesia: General | Site: Arm Lower | Laterality: Right

## 2023-12-05 MED ORDER — ACETAMINOPHEN 650 MG RE SUPP: 650.0000 mg | Freq: Four times a day (QID) | RECTAL | Status: DC | PRN

## 2023-12-05 MED ORDER — ORAL CARE MOUTH RINSE
15.0000 mL | Freq: Once | OROMUCOSAL | Status: AC
  Administered 2023-12-05: 15 mL via OROMUCOSAL

## 2023-12-05 MED ORDER — MIDODRINE HCL 5 MG PO TABS: 15.0000 mg | ORAL_TABLET | ORAL | Status: DC

## 2023-12-05 MED ORDER — CEFAZOLIN SODIUM-DEXTROSE 3-4 GM/150ML-% IV SOLN
3.0000 g | INTRAVENOUS | Status: AC
  Administered 2023-12-05: 3 g via INTRAVENOUS
  Filled 2023-12-05: qty 150

## 2023-12-05 MED ORDER — TRAMADOL HCL 50 MG PO TABS: 50.0000 mg | ORAL_TABLET | Freq: Two times a day (BID) | ORAL | Status: DC | PRN

## 2023-12-05 MED ORDER — ACETAMINOPHEN 500 MG PO TABS
1000.0000 mg | ORAL_TABLET | Freq: Once | ORAL | Status: AC
  Administered 2023-12-05: 1000 mg via ORAL
  Filled 2023-12-05: qty 2

## 2023-12-05 MED ORDER — CHLORHEXIDINE GLUCONATE CLOTH 2 % EX PADS: 6.0000 | MEDICATED_PAD | Freq: Once | CUTANEOUS | Status: DC

## 2023-12-05 MED ORDER — DEXAMETHASONE SODIUM PHOSPHATE 10 MG/ML IJ SOLN
INTRAMUSCULAR | Status: DC | PRN
  Administered 2023-12-05: 10 mg via INTRAVENOUS

## 2023-12-05 MED ORDER — PROPOFOL 10 MG/ML IV BOLUS
INTRAVENOUS | Status: DC | PRN
  Administered 2023-12-05: 100 mg via INTRAVENOUS
  Administered 2023-12-05: 40 mg via INTRAVENOUS
  Administered 2023-12-05: 200 mg via INTRAVENOUS

## 2023-12-05 MED ORDER — ONDANSETRON HCL 4 MG/2ML IJ SOLN
INTRAMUSCULAR | Status: AC
  Filled 2023-12-05: qty 2

## 2023-12-05 MED ORDER — SODIUM CHLORIDE 0.9 % IV SOLN: INTRAVENOUS | Status: AC

## 2023-12-05 MED ORDER — FENTANYL CITRATE (PF) 250 MCG/5ML IJ SOLN
INTRAMUSCULAR | Status: DC | PRN
  Administered 2023-12-05: 50 ug via INTRAVENOUS
  Administered 2023-12-05: 100 ug via INTRAVENOUS
  Administered 2023-12-05 (×2): 50 ug via INTRAVENOUS

## 2023-12-05 MED ORDER — SODIUM CHLORIDE 0.9 % IV SOLN: INTRAVENOUS | Status: DC

## 2023-12-05 MED ORDER — CALCITRIOL 0.5 MCG PO CAPS
0.5000 ug | ORAL_CAPSULE | Freq: Two times a day (BID) | ORAL | Status: DC
  Administered 2023-12-05 – 2023-12-07 (×4): 0.5 ug via ORAL
  Filled 2023-12-05 (×4): qty 1

## 2023-12-05 MED ORDER — PROPOFOL 10 MG/ML IV BOLUS
INTRAVENOUS | Status: AC
  Filled 2023-12-05: qty 20

## 2023-12-05 MED ORDER — 0.9 % SODIUM CHLORIDE (POUR BTL) OPTIME
TOPICAL | Status: DC | PRN
  Administered 2023-12-05: 1000 mL

## 2023-12-05 MED ORDER — ONDANSETRON HCL 4 MG/2ML IJ SOLN: 4.0000 mg | Freq: Four times a day (QID) | INTRAMUSCULAR | Status: DC | PRN

## 2023-12-05 MED ORDER — ACETAMINOPHEN 325 MG PO TABS: 650.0000 mg | ORAL_TABLET | Freq: Four times a day (QID) | ORAL | Status: DC | PRN

## 2023-12-05 MED ORDER — BUPIVACAINE HCL (PF) 0.25 % IJ SOLN
INTRAMUSCULAR | Status: DC | PRN
  Administered 2023-12-05: 7 mL

## 2023-12-05 MED ORDER — DEXAMETHASONE SODIUM PHOSPHATE 10 MG/ML IJ SOLN
INTRAMUSCULAR | Status: AC
  Filled 2023-12-05: qty 1

## 2023-12-05 MED ORDER — OXYCODONE HCL 5 MG PO TABS
5.0000 mg | ORAL_TABLET | ORAL | Status: DC | PRN
  Administered 2023-12-06: 10 mg via ORAL
  Filled 2023-12-05: qty 1
  Filled 2023-12-05: qty 2

## 2023-12-05 MED ORDER — HYDROMORPHONE HCL 1 MG/ML IJ SOLN
INTRAMUSCULAR | Status: AC
  Filled 2023-12-05: qty 1

## 2023-12-05 MED ORDER — ROCURONIUM BROMIDE 10 MG/ML (PF) SYRINGE
PREFILLED_SYRINGE | INTRAVENOUS | Status: AC
  Filled 2023-12-05: qty 10

## 2023-12-05 MED ORDER — PHENYLEPHRINE HCL-NACL 20-0.9 MG/250ML-% IV SOLN
INTRAVENOUS | Status: DC | PRN
  Administered 2023-12-05: 20 ug/min via INTRAVENOUS

## 2023-12-05 MED ORDER — DEXMEDETOMIDINE HCL IN NACL 80 MCG/20ML IV SOLN
INTRAVENOUS | Status: DC | PRN
  Administered 2023-12-05 (×2): 8 ug via INTRAVENOUS

## 2023-12-05 MED ORDER — ONDANSETRON HCL 4 MG/2ML IJ SOLN
INTRAMUSCULAR | Status: DC | PRN
  Administered 2023-12-05: 4 mg via INTRAVENOUS

## 2023-12-05 MED ORDER — CALCIUM GLUCONATE-NACL 1-0.675 GM/50ML-% IV SOLN
1.0000 g | Freq: Once | INTRAVENOUS | Status: AC
  Administered 2023-12-05: 1000 mg via INTRAVENOUS
  Filled 2023-12-05: qty 50

## 2023-12-05 MED ORDER — EPHEDRINE SULFATE-NACL 50-0.9 MG/10ML-% IV SOSY
PREFILLED_SYRINGE | INTRAVENOUS | Status: DC | PRN
  Administered 2023-12-05: 10 mg via INTRAVENOUS
  Administered 2023-12-05: 15 mg via INTRAVENOUS

## 2023-12-05 MED ORDER — BUPIVACAINE HCL (PF) 0.25 % IJ SOLN
INTRAMUSCULAR | Status: AC
  Filled 2023-12-05: qty 30

## 2023-12-05 MED ORDER — EPHEDRINE 5 MG/ML INJ
INTRAVENOUS | Status: AC
  Filled 2023-12-05: qty 5

## 2023-12-05 MED ORDER — TRAMADOL HCL 50 MG PO TABS: 50.0000 mg | ORAL_TABLET | Freq: Four times a day (QID) | ORAL | Status: DC | PRN

## 2023-12-05 MED ORDER — PHENYLEPHRINE HCL (PRESSORS) 10 MG/ML IV SOLN
INTRAVENOUS | Status: DC | PRN
  Administered 2023-12-05: 80 ug via INTRAVENOUS
  Administered 2023-12-05: 160 ug via INTRAVENOUS

## 2023-12-05 MED ORDER — HYDROMORPHONE HCL 1 MG/ML IJ SOLN
0.2500 mg | INTRAMUSCULAR | Status: DC | PRN
Start: 2023-12-05 — End: 2023-12-05
  Administered 2023-12-05 (×2): 0.5 mg via INTRAVENOUS

## 2023-12-05 MED ORDER — HYDROMORPHONE HCL 1 MG/ML IJ SOLN
1.0000 mg | INTRAMUSCULAR | Status: DC | PRN
  Administered 2023-12-05 – 2023-12-06 (×2): 1 mg via INTRAVENOUS
  Filled 2023-12-05 (×3): qty 1

## 2023-12-05 MED ORDER — LIDOCAINE 2% (20 MG/ML) 5 ML SYRINGE
INTRAMUSCULAR | Status: DC | PRN
  Administered 2023-12-05: 60 mg via INTRAVENOUS

## 2023-12-05 MED ORDER — LIDOCAINE 2% (20 MG/ML) 5 ML SYRINGE
INTRAMUSCULAR | Status: AC
  Filled 2023-12-05: qty 5

## 2023-12-05 MED ORDER — FENTANYL CITRATE (PF) 250 MCG/5ML IJ SOLN
INTRAMUSCULAR | Status: AC
  Filled 2023-12-05: qty 5

## 2023-12-05 MED ORDER — CHLORHEXIDINE GLUCONATE 0.12 % MT SOLN: 15.0000 mL | Freq: Once | OROMUCOSAL | Status: AC

## 2023-12-05 MED ORDER — ONDANSETRON 4 MG PO TBDP: 4.0000 mg | ORAL_TABLET | Freq: Four times a day (QID) | ORAL | Status: DC | PRN

## 2023-12-05 SURGICAL SUPPLY — 28 items
BLADE SURG 15 STRL LF DISP TIS (BLADE) ×1 IMPLANT
CANISTER SUCT 3000ML PPV (MISCELLANEOUS) ×1 IMPLANT
CNTNR URN SCR LID CUP LEK RST (MISCELLANEOUS) ×1 IMPLANT
COVER SURGICAL LIGHT HANDLE (MISCELLANEOUS) ×1 IMPLANT
DERMABOND ADVANCED .7 DNX12 (GAUZE/BANDAGES/DRESSINGS) IMPLANT
DRAPE LAPAROTOMY 100X72 PEDS (DRAPES) ×1 IMPLANT
DRAPE UTILITY XL STRL (DRAPES) ×1 IMPLANT
ELECT CAUTERY BLADE 6.4 (BLADE) ×1 IMPLANT
ELECT REM PT RETURN 9FT ADLT (ELECTROSURGICAL) ×1
ELECTRODE REM PT RTRN 9FT ADLT (ELECTROSURGICAL) ×1 IMPLANT
GLOVE SURG ORTHO 8.0 STRL STRW (GLOVE) ×1 IMPLANT
GOWN STRL REUS W/ TWL LRG LVL3 (GOWN DISPOSABLE) ×1 IMPLANT
GOWN STRL REUS W/ TWL XL LVL3 (GOWN DISPOSABLE) ×1 IMPLANT
KIT BASIN OR (CUSTOM PROCEDURE TRAY) ×1 IMPLANT
KIT TURNOVER KIT B (KITS) ×1 IMPLANT
NDL HYPO 25GX1X1/2 BEV (NEEDLE) ×1 IMPLANT
NEEDLE HYPO 25GX1X1/2 BEV (NEEDLE) ×1
NS IRRIG 1000ML POUR BTL (IV SOLUTION) ×1 IMPLANT
PACK SRG BSC III STRL LF ECLPS (CUSTOM PROCEDURE TRAY) ×1 IMPLANT
PAD ARMBOARD 7.5X6 YLW CONV (MISCELLANEOUS) ×1 IMPLANT
PENCIL BUTTON HOLSTER BLD 10FT (ELECTRODE) ×1 IMPLANT
POSITIONER HEAD DONUT 9IN (MISCELLANEOUS) ×1 IMPLANT
SUT MNCRL AB 4-0 PS2 18 (SUTURE) ×1 IMPLANT
SUT VIC AB 3-0 SH 18 (SUTURE) ×1 IMPLANT
SYR BULB EAR ULCER 3OZ GRN STR (SYRINGE) ×1 IMPLANT
SYR CONTROL 10ML LL (SYRINGE) ×1 IMPLANT
TOWEL GREEN STERILE FF (TOWEL DISPOSABLE) ×1 IMPLANT
TUBE CONNECTING 12X1/4 (SUCTIONS) ×1 IMPLANT

## 2023-12-05 NOTE — Interval H&P Note (Signed)
History and Physical Interval Note:  12/05/2023 10:44 AM  [name]  has presented today for surgery, with the diagnosis of RECURRENT SECONDARY HYPEPARATHYROIDISM.  The various methods of treatment have been discussed with the patient and family. After consideration of risks, benefits and other options for treatment, the patient has consented to    Procedure(s): EXCISION OF PARATHYROID GRAFTS FROM RIGHT RIGHT FOREARM (Right) as a surgical intervention.    The patient's history has been reviewed, patient examined, no change in status, stable for surgery.  I have reviewed the patient's chart and labs.  Questions were answered to the patient's satisfaction.    [name], MD [facility] Surgery A [facility] practice Office: [phone]   [name]

## 2023-12-05 NOTE — Anesthesia Procedure Notes (Addendum)
Procedure Name: LMA Insertion Date/Time: 12/05/2023 11:16 AM  Performed by: Camillia Herter, CRNAPre-anesthesia Checklist: Patient identified, Emergency Drugs available, Suction available and Patient being monitored Patient Re-evaluated:Patient Re-evaluated prior to induction Oxygen Delivery Method: Circle System Utilized Preoxygenation: Pre-oxygenation with 100% oxygen Induction Type: IV induction Ventilation: Mask ventilation without difficulty LMA: LMA inserted LMA Size: 5.0 Number of attempts: 1 Airway Equipment and Method: Bite block Placement Confirmation: positive ETCO2 Tube secured with: Tape Dental Injury: Teeth and Oropharynx as per pre-operative assessment

## 2023-12-05 NOTE — Op Note (Signed)
Operative Note  Pre-operative Diagnosis:  recurrent secondary hyperparathyroidism  Post-operative Diagnosis:  same  Surgeon:  [name], MD  Assistant:  none   Procedure:  Excision of parathyroid grafts from right brachioradialis muscle  Anesthesia:  general (LMA)  Estimated Blood Loss:  25 cc  Drains: none         Specimen: explanted grafts to pathology  Indications:    Procedure:  The patient was seen in the pre-op holding area. The risks, benefits, complications, treatment options, and expected outcomes were previously discussed with the patient. The patient agreed with the proposed plan and has signed the informed consent form.  The patient was brought to the operating room by the surgical team, identified as [name] and the procedure verified. A "time out" was completed and the above information confirmed.  Following induction of general anesthesia, the patient is positioned and then prepped and draped in the usual aseptic fashion.  After ascertaining that an adequate level of anesthesia been achieved, an elliptical incision is made with a #15 blade so as to excised the surgical scar overlying the right brachial radialis muscle.  Dissection was carried into the subcutaneous tissues and hemostasis obtained with the electrocautery.  Skin flaps are elevated circumferentially exposing the underlying muscle fascia.  8 separate Prolene sutures are identified representing the site of implantation of parathyroid grafts.  Palpation reveals several prominent nodules beneath the muscle fascia.  Using the electrocautery the fascia is incised and what appears to be hypertrophic parathyroid tissue is excised.  It appears that 4 or 5 of the grafts have become hyperplastic.  These are excised using the electrocautery for hemostasis.  The overlying suture material is also removed.  After removal there is no further palpable abnormality within the muscle.  Muscle fascia is closed with  interrupted 3-0 Vicryl sutures.  Good hemostasis is achieved with the electrocautery.  Local anesthetic is infiltrated circumferentially.  Subcutaneous tissues are closed with interrupted 3-0 Vicryl sutures.  Skin is closed with a running 4-0 Monocryl subcuticular suture.  Wound was washed and dried and Dermabond is applied as dressing.  Patient is awakened from anesthesia and transferred to the recovery room in stable condition.  The patient tolerated the procedure well.   [name], MD [facility] Surgery Office: [phone]

## 2023-12-05 NOTE — Anesthesia Postprocedure Evaluation (Signed)
Anesthesia Post Note  Patient: Burton Apley  Procedure(s) Performed: EXCISION OF PARATHYROID GRAFTS FROM RIGHT RIGHT FOREARM (Right)     Patient location during evaluation: PACU Anesthesia Type: General Level of consciousness: awake and alert Pain management: pain level controlled Vital Signs Assessment: post-procedure vital signs reviewed and stable Respiratory status: spontaneous breathing, nonlabored ventilation and respiratory function stable Cardiovascular status: blood pressure returned to baseline and stable Postop Assessment: no apparent nausea or vomiting Anesthetic complications: no  No notable events documented.  Last Vitals:  Vitals:   12/05/23 1400 12/05/23 1430  BP: (!) 165/84 (!) 168/88  Pulse: 71   Resp: 17 20  Temp: 36.7 C   SpO2: 97% 96%    Last Pain:  Vitals:   12/05/23 1430  TempSrc:   PainSc: 0-No pain                 Lametria Klunk,W. EDMOND

## 2023-12-05 NOTE — Plan of Care (Signed)
  Problem: Education: Goal: Knowledge of General Education information will improve Description: Including pain rating scale, medication(s)/side effects and non-pharmacologic comfort measures Outcome: Progressing   Problem: Health Behavior/Discharge Planning: Goal: Ability to manage health-related needs will improve Outcome: Progressing   Problem: Clinical Measurements: Goal: Ability to maintain clinical measurements within normal limits will improve Outcome: Progressing Goal: Will remain free from infection Outcome: Progressing Goal: Diagnostic test results will improve Outcome: Progressing Goal: Respiratory complications will improve Outcome: Progressing Goal: Cardiovascular complication will be avoided Outcome: Progressing   Problem: Activity: Goal: Risk for activity intolerance will decrease Outcome: Progressing   Problem: Nutrition: Goal: Adequate nutrition will be maintained Outcome: Progressing   Problem: Coping: Goal: Level of anxiety will decrease Outcome: Progressing   Problem: Elimination: Goal: Will not experience complications related to bowel motility Outcome: Progressing Goal: Will not experience complications related to urinary retention Outcome: Progressing   Problem: Pain Managment: Goal: General experience of comfort will improve and/or be controlled Outcome: Progressing   Problem: Safety: Goal: Ability to remain free from injury will improve Outcome: Progressing   Problem: Skin Integrity: Goal: Risk for impaired skin integrity will decrease Outcome: Progressing   Problem: Education: Goal: Required Educational Video(s) Outcome: Progressing   Problem: Clinical Measurements: Goal: Postoperative complications will be avoided or minimized Outcome: Progressing   Problem: Skin Integrity: Goal: Demonstration of wound healing without infection will improve Outcome: Progressing

## 2023-12-05 NOTE — Transfer of Care (Signed)
Immediate Anesthesia Transfer of Care Note  Patient: [name]  Procedure(s) Performed: EXCISION OF PARATHYROID GRAFTS FROM RIGHT RIGHT FOREARM (Right)  Patient Location: PACU  Anesthesia Type:General  Level of Consciousness: awake and patient cooperative  Airway & Oxygen Therapy: Patient Spontanous Breathing and Patient connected to face mask oxygen  Post-op Assessment: Report given to RN and Post -op Vital signs reviewed and stable  Post vital signs: Reviewed and stable  Last Vitals:  Vitals Value Taken Time  BP 150/70 12/05/23 1225  Temp 36.5 C 12/05/23 1225  Pulse 72 12/05/23 1229  Resp 25 12/05/23 1229  SpO2 99 % 12/05/23 1229  Vitals shown include unfiled device data.  Last Pain:  Vitals:   12/05/23 0920  TempSrc:   PainSc: 7       Patients Stated Pain Goal: 0 (12/05/23 0920)  Complications: No notable events documented.

## 2023-12-05 NOTE — Consult Note (Addendum)
[facility] Renal Consultation Note  Requesting MD: [name], MD  Indication for Consultation: ESRD and secondary hyperparathyroidism   Chief complaint: planned surgery - taking out gland from arm   HPI:  [name] is a 53 y.o. male with a history of ESRD on hemodialysis, refractory secondary hyperparathyroidism, HTN, and GERD who presented to the hospital for a planned excision of parathyroid gland tissue from his right forearm.  He had a total parathyroidectomy with autotransplantation of 1 gland to his right forearm in 2017 per charting.  He had more recently been experiencing rising PTH levels and had a nuclear parathyroid scan in 04/2023 which demonstrated residual activity in the left lower thyroid gland representing possible residual parathyroid tissue or a 5th gland; he also had the expected focal activity in the right forearm consistent with transplanted parathyroid gland.   He then underwent a parathyroidectomy with neck exploration and resection of the left inferior hypercellular parathyroid gland on October 12, 2023.  Today, his initial ionized calcium was measured at 1.07.  We ordered calcium 1 g IV once this evening.  Calcium this evening returned at 9.3.  He feels ok.  States that after the December surgery he was in the hospital overnight; in 2017 he was in the hospital for a week.  He states that he hasn't taken any midodrine for about three months.    PMHx:   Past Medical History:  Diagnosis Date   Anemia    Iron deficiency   Aneurysm (HCC) 02/15/2007   Right arm large aneurysm   Constipation    End stage renal disease on dialysis [facility])    M-W-F   GERD (gastroesophageal reflux disease)    Hemodialysis finding 02/23/2004   First HD at [facility].   History of bronchitis    Hypertension    Sickle cell trait [facility])     Past Surgical History:  Procedure Laterality Date   AV FISTULA PLACEMENT     AV FISTULA PLACEMENT Left 05/03/2013    Procedure: ARTERIOVENOUS (AV) FISTULA CREATION;  Surgeon: Larina Earthly, MD;  Location: [facility] OR;  Service: Vascular;  Laterality: Left;   BASCILIC VEIN TRANSPOSITION Left 11/28/2016   Procedure: BASCILIC VEIN TRANSPOSITION- LEFT ARM;  Surgeon: Larina Earthly, MD;  Location[facility] OR;  Service: Vascular;  Laterality: Left;   COLONOSCOPY  2022   FISTULA SUPERFICIALIZATION Left 07/24/2013   Procedure: FISTULA SUPERFICIALIZATION AND LIGATION OF BRANCHES;  Surgeon: Larina Earthly, MD;  Location: [facility] OR;  Service: Vascular;  Laterality: Left;   FRACTURE SURGERY Left 07/28/2015   Petela Tendon and Fx   INSERTION OF DIALYSIS CATHETER Right 04/22/2013   Procedure: INSERTION OF DIALYSIS CATHETER;  Surgeon: Larina Earthly, MD;  Location: [facility] OR;  Service: Vascular;  Laterality: Right;   IR DIALY SHUNT INTRO NEEDLE/INTRACATH INITIAL W/IMG LEFT Left 04/06/2023   IR US GUIDE VASC ACCESS LEFT  04/06/2023   LIGATION OF ARTERIOVENOUS  FISTULA Right 04/22/2013   Procedure: LIGATION OF ARTERIOVENOUS  FISTULA  AND RESECTION OF VENOUS ANEURYSM AND ABSCESS;  Surgeon: Larina Earthly, MD;  Location: [facility] OR;  Service: Vascular;  Laterality: Right;   PARATHYROIDECTOMY N/A 02/12/2016   Procedure:  TOTAL PARATHYROIDECTOMY AUTOTRANSPLANT TO RIGHT FOREARM ;  Surgeon: [name], MD;  Location: [facility] OR;  Service: General;  Laterality: N/A;   PARATHYROIDECTOMY N/A 10/12/2023   Procedure: NECK EXPLORATION WITH PARATHYROIDECTOMY;  Surgeon: [name], MD;  Location: [facility] OR;  Service: General;  Laterality: N/A;  QUADRICEPS TENDON REPAIR Left 08/24/2015   Procedure: REPAIR OF LEFT QUADRICEP TENDON;  Surgeon: Gean Birchwood, MD;  Location: [facility] OR;  Service: Orthopedics;  Laterality: Left;   REMOVAL OF A DIALYSIS CATHETER  10/24/2012   TEE WITHOUT CARDIOVERSION N/A 09/13/2013   Procedure: TRANSESOPHAGEAL ECHOCARDIOGRAM (TEE);  Surgeon: Thurmon Fair, MD;  Location: [facility] ENDOSCOPY;  Service: Cardiovascular;  Laterality: N/A;    Family Hx:  Family  History  Problem Relation Age of Onset   Hypertension Mother    Heart disease Mother        before age 35   Other Brother        DVT    Social History:  reports that he has been smoking cigars. He has never used smokeless tobacco. He reports current alcohol use. He reports that he does not use drugs.  Allergies:  Allergies  Allergen Reactions   Tape Other (See Comments)    PAPER TAPE, PLEASE!!   Chlorhexidine Itching, Rash and Other (See Comments)    Redness and irritation, also    Medications: Prior to Admission medications   Medication Sig Start Date End Date Taking? Authorizing Provider  acetaminophen (TYLENOL) 500 MG tablet Take 1,000 mg by mouth 2 (two) times daily as needed (pain or headaches).   Yes [provider]  cetirizine (ZYRTEC) 10 MG tablet Take 10 mg by mouth daily as needed for allergies. 05/08/23  Yes [provider]  Famotidine (ZANTAC 360 PO) Take 1-2 tablets by mouth daily as needed (reflux).   Yes [provider]  fluticasone (FLONASE) 50 MCG/ACT nasal spray Place 1-2 sprays into both nostrils as needed for allergies. 07/13/20  Yes [provider]  lactulose, encephalopathy, (CHRONULAC) 10 GM/15ML SOLN Take 20 g by mouth 2 (two) times daily as needed. 11/20/23  Yes [provider]  SENSIPAR 30 MG tablet Take 30 mg by mouth daily with breakfast. 11/01/23  Yes [provider]  VELPHORO 500 MG chewable tablet Chew 1,500-2,000 mg by mouth 3 (three) times daily with meals. 06/19/23  Yes [provider]  midodrine (PROAMATINE) 10 MG tablet Take 15 mg by mouth See admin instructions. Take 1 tablet 15 mg by mouth prior to dialysis and 1 tablet 15 mg half way through dialysis - Monday, Wednesday, Friday (Not actually taking but on the imported med list)   [provider]  oxyCODONE (OXY IR/ROXICODONE) 5 MG immediate release tablet Take 1 tablet (5 mg total) by mouth every 6 (six) hours as needed for moderate  pain (pain score 4-6). 10/13/23   [name], MD   I have reviewed the patient's current and reported prior to admission medications.  Labs:     Latest Ref Rng & Units 12/05/2023    6:09 PM 12/05/2023    9:33 AM 10/13/2023    2:06 PM  BMP  Glucose 70 - 99 mg/dL  84  846   BUN 6 - 20 mg/dL  17  47   Creatinine 9.62 - 1.24 mg/dL  9.52  84.13   Sodium 244 - 145 mmol/L  136  133   Potassium 3.5 - 5.1 mmol/L  3.7  4.3   Chloride 98 - 111 mmol/L  93  93   CO2 22 - 32 mmol/L   26   Calcium 8.9 - 10.3 mg/dL 9.3   9.7     Urinalysis No results found for: "COLORURINE", "APPEARANCEUR", "LABSPEC", "PHURINE", "GLUCOSEU", "HGBUR", "BILIRUBINUR", "KETONESUR", "PROTEINUR", "UROBILINOGEN", "NITRITE", "LEUKOCYTESUR"   ROS:  Pertinent items  noted in HPI and remainder of comprehensive ROS otherwise negative.  Physical Exam: Vitals:   12/05/23 1703 12/05/23 1800  BP: (!) 198/110 132/74  Pulse: 82   Resp: 18   Temp:    SpO2: 100%      General:  adult male in bed in NAD  HEENT: NCAT Eyes: EOMI sclera anicteric  Neck: supple trachea midline Heart: S1S2 no rub Lungs: clear and unlabored; normal work of breathing Abdomen: soft/nt/obese habitus Extremities: no pitting edema; no cyanosis or clubbing; right arm with surgical incision - approximated Skin: no rash on extremities exposed Neuro: alert and oriented x 3 provides hx and follows commands  Psych normal mood and affect Access: LUE AVF with bruit and thrill   Outpatient HD orders:  [facility] HD unit  MWF 5 hours and 30 minutes  2 K / 2 Ca  250 dialyzer BF 450 DF Auto 1.5 EDW 139.3 kg AV fistula  Note on profile 2 Meds: hectorol 2 mcg three times a week with HD; not on ESA currently   Assessment/Plan:  # Secondary hyperparathyroidism, renal  - s/p resection of implanted parathyroid tissue in arm after parathyroidectomy x 2 prior.  Watching for the development of hungry bone - Check calcium twice a day for now - check pre  and post-op is ok thus far - s/p calcium gluconate 1 gram IV once - on calcitriol here for now - continue for tonight but may be able to adjust to just dosing the activated vit D with HD outpatient as this could be difficult to obtain at home  - would anticipate need to increase calcium bath outpatient to at least a 2.5 - follow trends  # ESRD  - HD per MWF schedule   - note that he appears to have had clearance issues outpatient - he is on 5.5 hours and a 250 dialyzer - Will do 3.5 hours for now and hopeful that he is stable for discharge soon  # Chronic hypotension - he states he has been off of the pre-HD midodrine for three months - I have discontinued here.  Would not resume on discharge  # Anemia of CKD - Defer ESA for now - Hb 12.6  Disposition - continue hospital monitoring at this time   Estanislado Emms 12/05/2023, 9:35 PM

## 2023-12-06 ENCOUNTER — Encounter: Payer: Self-pay | Admitting: Surgery

## 2023-12-06 ENCOUNTER — Other Ambulatory Visit: Payer: Self-pay

## 2023-12-06 ENCOUNTER — Encounter (HOSPITAL_COMMUNITY): Payer: Self-pay | Admitting: Surgery

## 2023-12-06 DIAGNOSIS — N2581 Secondary hyperparathyroidism of renal origin: Secondary | ICD-10-CM | POA: Diagnosis not present

## 2023-12-06 LAB — CBC
HCT: 33.2 % — ABNORMAL LOW (ref 39.0–52.0)
Hemoglobin: 11.8 g/dL — ABNORMAL LOW (ref 13.0–17.0)
MCH: 30.1 pg (ref 26.0–34.0)
MCHC: 35.5 g/dL (ref 30.0–36.0)
MCV: 84.7 fL (ref 80.0–100.0)
Platelets: 183 10*3/uL (ref 150–400)
RBC: 3.92 MIL/uL — ABNORMAL LOW (ref 4.22–5.81)
RDW: 13.2 % (ref 11.5–15.5)
WBC: 9.7 10*3/uL (ref 4.0–10.5)
nRBC: 0 % (ref 0.0–0.2)

## 2023-12-06 LAB — CALCIUM
Calcium: 8.2 mg/dL — ABNORMAL LOW (ref 8.9–10.3)
Calcium: 7.8 mg/dL — ABNORMAL LOW (ref 8.9–10.3)

## 2023-12-06 LAB — SURGICAL PATHOLOGY

## 2023-12-06 MED ORDER — BISACODYL 5 MG PO TBEC
5.0000 mg | DELAYED_RELEASE_TABLET | Freq: Every day | ORAL | Status: DC | PRN
  Administered 2023-12-06 – 2023-12-07 (×2): 5 mg via ORAL
  Filled 2023-12-06 (×4): qty 1

## 2023-12-06 MED ORDER — CALCIUM CARBONATE 1250 (500 CA) MG PO TABS
1000.0000 mg | ORAL_TABLET | Freq: Two times a day (BID) | ORAL | Status: DC
  Administered 2023-12-06 – 2023-12-07 (×2): 2500 mg via ORAL
  Filled 2023-12-06 (×3): qty 2

## 2023-12-06 MED ORDER — HEPARIN SODIUM (PORCINE) 1000 UNIT/ML IJ SOLN
INTRAMUSCULAR | Status: AC
  Administered 2023-12-06: 10000 [IU] via INTRAVENOUS_CENTRAL
  Filled 2023-12-06: qty 10

## 2023-12-06 MED ORDER — HEPARIN SODIUM (PORCINE) 1000 UNIT/ML DIALYSIS: 10000.0000 [IU] | Freq: Once | INTRAMUSCULAR | Status: AC

## 2023-12-06 MED ORDER — CALCIUM CARBONATE 1250 (500 CA) MG PO TABS
2500.0000 mg | ORAL_TABLET | Freq: Two times a day (BID) | ORAL | Status: DC
  Filled 2023-12-06: qty 2

## 2023-12-06 NOTE — Plan of Care (Signed)
  Problem: Education: Goal: Knowledge of General Education information will improve Description: Including pain rating scale, medication(s)/side effects and non-pharmacologic comfort measures 12/06/2023 0738 by Darryl Nestle, RN Outcome: Progressing 12/05/2023 1818 by Darryl Nestle, RN Outcome: Progressing   Problem: Health Behavior/Discharge Planning: Goal: Ability to manage health-related needs will improve 12/06/2023 0738 by Darryl Nestle, RN Outcome: Progressing 12/05/2023 1818 by Darryl Nestle, RN Outcome: Progressing   Problem: Clinical Measurements: Goal: Ability to maintain clinical measurements within normal limits will improve 12/06/2023 0738 by Darryl Nestle, RN Outcome: Progressing 12/05/2023 1818 by Darryl Nestle, RN Outcome: Progressing Goal: Will remain free from infection 12/06/2023 0738 by Darryl Nestle, RN Outcome: Progressing 12/05/2023 1818 by Darryl Nestle, RN Outcome: Progressing Goal: Diagnostic test results will improve 12/06/2023 0738 by Darryl Nestle, RN Outcome: Progressing 12/05/2023 1818 by Darryl Nestle, RN Outcome: Progressing Goal: Respiratory complications will improve 12/06/2023 0738 by Darryl Nestle, RN Outcome: Progressing 12/05/2023 1818 by Darryl Nestle, RN Outcome: Progressing Goal: Cardiovascular complication will be avoided 12/06/2023 0738 by Darryl Nestle, RN Outcome: Progressing 12/05/2023 1818 by Darryl Nestle, RN Outcome: Progressing   Problem: Activity: Goal: Risk for activity intolerance will decrease 12/06/2023 0738 by Darryl Nestle, RN Outcome: Progressing 12/05/2023 1818 by Darryl Nestle, RN Outcome: Progressing   Problem: Nutrition: Goal: Adequate nutrition will be maintained 12/06/2023 0738 by Darryl Nestle, RN Outcome: Progressing 12/05/2023 1818 by Darryl Nestle, RN Outcome: Progressing   Problem: Coping: Goal: Level of anxiety will  decrease 12/06/2023 0738 by Darryl Nestle, RN Outcome: Progressing 12/05/2023 1818 by Darryl Nestle, RN Outcome: Progressing   Problem: Elimination: Goal: Will not experience complications related to bowel motility 12/06/2023 0738 by Darryl Nestle, RN Outcome: Progressing 12/05/2023 1818 by Darryl Nestle, RN Outcome: Progressing Goal: Will not experience complications related to urinary retention 12/06/2023 0738 by Darryl Nestle, RN Outcome: Progressing 12/05/2023 1818 by Darryl Nestle, RN Outcome: Progressing   Problem: Pain Managment: Goal: General experience of comfort will improve and/or be controlled 12/06/2023 0738 by Darryl Nestle, RN Outcome: Progressing 12/05/2023 1818 by Darryl Nestle, RN Outcome: Progressing   Problem: Safety: Goal: Ability to remain free from injury will improve 12/06/2023 0738 by Darryl Nestle, RN Outcome: Progressing 12/05/2023 1818 by Darryl Nestle, RN Outcome: Progressing   Problem: Skin Integrity: Goal: Risk for impaired skin integrity will decrease 12/06/2023 0738 by Darryl Nestle, RN Outcome: Progressing 12/05/2023 1818 by Darryl Nestle, RN Outcome: Progressing   Problem: Education: Goal: Required Educational Video(s) 12/06/2023 0738 by Darryl Nestle, RN Outcome: Progressing 12/05/2023 1818 by Darryl Nestle, RN Outcome: Progressing   Problem: Clinical Measurements: Goal: Postoperative complications will be avoided or minimized 12/06/2023 0738 by Darryl Nestle, RN Outcome: Progressing 12/05/2023 1818 by Darryl Nestle, RN Outcome: Progressing   Problem: Skin Integrity: Goal: Demonstration of wound healing without infection will improve 12/06/2023 0738 by Darryl Nestle, RN Outcome: Progressing 12/05/2023 1818 by Darryl Nestle, RN Outcome: Progressing

## 2023-12-06 NOTE — Progress Notes (Signed)
    Assessment & Plan: POD#1 - status post excision parathryoid grafts from right forearm  Calcium 9.3 this AM  HD today per nephrology  Home when stable from nephrology standpoint for outpatient dialysis  Patient looks good this AM.  Mild pain. Mild swelling.  Planning HD today.  Will discharge when OK with nephrology.  Follow PTH next few months.        [name], MD [facility] practice Office: [phone]        Chief Complaint: Recurrent secondary hyperparathyroidism  Subjective: Patient up at bedside, having breakfast.  Mild pain.  Objective: Vital signs in last 24 hours: Temp:  [97.4 F (36.3 C)-98.4 F (36.9 C)] 97.4 F (36.3 C) (02/12 0981) Pulse Rate:  [70-91] 89 (02/12 0633) Resp:  [14-27] 19 (02/12 0733) BP: (116-198)/(57-110) 116/57 (02/12 0633) SpO2:  [95 %-100 %] 100 % (02/12 1914) Weight:  [136.5 kg] 136.5 kg (02/11 0853) Last BM Date : 12/03/23  Intake/Output from previous day: 02/11 0701 - 02/12 0700 In: 344.2 [I.V.:300; IV Piggyback:44.2] Out: 5 [Blood:5] Intake/Output this shift: No intake/output data recorded.  Physical Exam: Right forearm - wound dry and intact with Dermabond;  mild STS; minimal tenderness  Lab Results:  Recent Labs    12/05/23 0933  HGB 12.6*  HCT 37.0*   BMET Recent Labs    12/05/23 0933 12/05/23 1809  NA 136  --   K 3.7  --   CL 93*  --   GLUCOSE 84  --   BUN 17  --   CREATININE 9.90*  --   CALCIUM  --  9.3   PT/INR No results for input(s): "LABPROT", "INR" in the last 72 hours. Comprehensive Metabolic Panel:    Component Value Date/Time   NA 136 12/05/2023 0933   NA 133 (L) 10/13/2023 1406   K 3.7 12/05/2023 0933   K 4.3 10/13/2023 1406   CL 93 (L) 12/05/2023 0933   CL 93 (L) 10/13/2023 1406   CO2 26 10/13/2023 1406   CO2 30 10/06/2023 1530   BUN 17 12/05/2023 0933   BUN 47 (H) 10/13/2023 1406   CREATININE 9.90 (H) 12/05/2023 0933   CREATININE 11.78 (H) 10/13/2023  1406   GLUCOSE 84 12/05/2023 0933   GLUCOSE 114 (H) 10/13/2023 1406   CALCIUM 9.3 12/05/2023 1809   CALCIUM 9.7 10/13/2023 1406   AST 14 (L) 10/08/2018 1721   AST 12 01/25/2014 0838   ALT 13 10/08/2018 1721   ALT 8 01/25/2014 0838   ALKPHOS 40 10/08/2018 1721   ALKPHOS 42 01/25/2014 0838   BILITOT 1.8 (H) 10/08/2018 1721   BILITOT 0.9 01/25/2014 0838   PROT 9.0 (H) 10/08/2018 1721   PROT 8.3 01/25/2014 0838   ALBUMIN 3.3 (L) 10/13/2023 1406   ALBUMIN 3.6 10/08/2018 1721    Studies/Results: No results found.    [name] 12/06/2023  Patient ID: [name], male   DOB: 1971-10-16, 53 y.o.   MRN: [id number]

## 2023-12-06 NOTE — Progress Notes (Signed)
[facility] Progress Note   Subjective:    Seen and examined patient at bedside. Patient's brother also at bedside. S/p excision of parathyroid grafts from right right brachioradialis muscle 2/11 by Dr. Gerrit Friends. Currently, patient feels well with no acute symptoms. Last Ca was 8.2. He received IV Ca Gluconate overnight and remains on Calcitriol 0.29mcg BID. Plan for HD this afternoon per his routine schedule. Continue to check labs throughout the night and AM. If labs remains stable in the AM, okay for discharge.  Objective Vitals:   12/05/23 2055 12/06/23 0633 12/06/23 0733 12/06/23 0903  BP: 127/82 (!) 116/57  111/73  Pulse: 80 89  92  Resp: 20 16 19 18   Temp: 98.4 F (36.9 C) (!) 97.4 F (36.3 C)  98.6 F (37 C)  TempSrc: Oral Oral    SpO2: 100% 100%  100%  Weight:      Height:       Physical Exam General: Awake, alert, on RA, NAD Heart: S1 and S2; No murmurs, gallops, or rubs Lungs: Clear throughout Abdomen: Soft and non-tender Extremities: No LE edema Dialysis Access: L AVF (+) B/T   Filed Weights   12/05/23 0853  Weight: (!) 136.5 kg    Intake/Output Summary (Last 24 hours) at 12/06/2023 1335 Last data filed at 12/06/2023 0518 Gross per 24 hour  Intake 44.22 ml  Output 0 ml  Net 44.22 ml    Additional Objective Labs: Basic Metabolic Panel: Recent Labs  Lab 12/05/23 0933 12/05/23 1809 12/06/23 0803  NA 136  --   --   K 3.7  --   --   CL 93*  --   --   GLUCOSE 84  --   --   BUN 17  --   --   CREATININE 9.90*  --   --   CALCIUM  --  9.3 8.2*   Liver Function Tests: No results for input(s): "AST", "ALT", "ALKPHOS", "BILITOT", "PROT", "ALBUMIN" in the last 168 hours. No results for input(s): "LIPASE", "AMYLASE" in the last 168 hours. CBC: Recent Labs  Lab 12/05/23 0933  HGB 12.6*  HCT 37.0*   Blood Culture    Component Value Date/Time   SDES BLOOD RIGHT ARM 10/08/2018 1710   SPECREQUEST  10/08/2018 1710    BOTTLES DRAWN AEROBIC  AND ANAEROBIC Blood Culture adequate volume   CULT  10/08/2018 1710    NO GROWTH 5 DAYS Performed at [facility], [address] [place], [place] [address]    REPTSTATUS 10/13/2018 FINAL 10/08/2018 1710    Cardiac Enzymes: No results for input(s): "CKTOTAL", "CKMB", "CKMBINDEX", "TROPONINI" in the last 168 hours. CBG: No results for input(s): "GLUCAP" in the last 168 hours. Iron Studies: No results for input(s): "IRON", "TIBC", "TRANSFERRIN", "FERRITIN" in the last 72 hours. Lab Results  Component Value Date   INR 1.09 10/08/2018   Studies/Results: No results found.  Medications:  sodium chloride      calcitRIOL  0.5 mcg Oral BID   calcium carbonate  2,500 mg Oral BID    Dialysis Orders: [facility] HD unit  MWF 5 hours and 30 minutes  2 K / 2 Ca  250 dialyzer BF 450 DF Auto 1.5 EDW 139.3 kg AV fistula  Note on profile 2 Meds: hectorol 2 mcg three times a week with HD; not on ESA currently  Assessment/Plan: # Secondary hyperparathyroidism, renal  - s/p resection of implanted parathyroid tissue in arm after parathyroidectomy x 2 prior.  Watching for  the development of hungry bone - Check calcium twice a day for now - check pre and post-op is ok thus far. Last Ca is 8.2 - s/p calcium gluconate 1 gram IV once overnight - on calcitriol here for now - continue for tonight but may be able to adjust to just dosing the activated vit D with HD outpatient as this could be difficult to obtain at home  -added calcium carbonate (TUMS) 2 tablets BID - using 2.5Ca baths while here and anticipate we need to continue this in outpatient-follow trends   # ESRD  - HD per MWF schedule. Plan for HD later this afternoon   - note that he appears to have had clearance issues outpatient - he is on 5.5 hours and a 250 dialyzer - Will do 3.5 hours for now and hopeful that he is stable for discharge soon   # Chronic hypotension - he states he has been off of the pre-HD midodrine for  three months - I have discontinued here.  Would not resume on discharge   # Anemia of CKD - Defer ESA for now - Hb 12.6  Salome Holmes, NP [facility] 12/06/2023,1:35 PM  LOS: 0 days

## 2023-12-06 NOTE — Progress Notes (Signed)
Pathology as expected.  [name], MD [facility] practice Office: [phone]

## 2023-12-06 NOTE — TOC CM/SW Note (Signed)
Transition of Care [facility]) - Inpatient Brief Assessment   Patient Details  Name: [name] MRN: [id number] Date of Birth: 04/05/1971  [facility]) CM/SW Contact:    Tom-Johnson, Hershal Coria, RN Phone Number: 12/06/2023, 3:38 PM   Clinical Narrative:  Patient presented to the Hospital with Recurrent Secondary Hyperparathyroidism. Underwent   Excision of Parathyroid Grafts from Rt Brachioradialis Muscle by General Sx.  Patient has hx of ESRD, on MWF outpatient HD schedule. Patient states he drives self to and from his HD. Nephrology following for inpatient HD.  From home alone, has one supportive son. Does not have DME's at home.  PCP is Dagoberto Ligas, MD and uses [facility] on [address] in [place].  No TOC needs or recommendations noted at this time.  Patient not Medically ready for discharge.  CM will continue to follow as patient progresses with care towards discharge.              Transition of Care Asessment: Insurance and Status: Insurance coverage has been reviewed Patient has primary care physician: Yes Home environment has been reviewed: Yes Prior level of function:: Independent Prior/Current Home Services: No current home services Social Drivers of Health Review: SDOH reviewed no interventions necessary Readmission risk has been reviewed: Yes Transition of care needs: no transition of care needs at this time

## 2023-12-06 NOTE — Plan of Care (Signed)
Problem: Education: Goal: Knowledge of General Education information will improve Description: Including pain rating scale, medication(s)/side effects and non-pharmacologic comfort measures Outcome: Progressing   Problem: Clinical Measurements: Goal: Respiratory complications will improve Outcome: Progressing   Problem: Activity: Goal: Risk for activity intolerance will decrease Outcome: Progressing

## 2023-12-07 DIAGNOSIS — N2581 Secondary hyperparathyroidism of renal origin: Secondary | ICD-10-CM | POA: Diagnosis not present

## 2023-12-07 LAB — RENAL FUNCTION PANEL
Albumin: 3.4 g/dL — ABNORMAL LOW (ref 3.5–5.0)
Anion gap: 13 (ref 5–15)
BUN: 19 mg/dL (ref 6–20)
CO2: 27 mmol/L (ref 22–32)
Calcium: 8.3 mg/dL — ABNORMAL LOW (ref 8.9–10.3)
Chloride: 93 mmol/L — ABNORMAL LOW (ref 98–111)
Creatinine, Ser: 8.74 mg/dL — ABNORMAL HIGH (ref 0.61–1.24)
GFR, Estimated: 7 mL/min — ABNORMAL LOW (ref >60)
Glucose, Bld: 94 mg/dL (ref 70–99)
Phosphorus: 3.6 mg/dL (ref 2.5–4.6)
Potassium: 3.5 mmol/L (ref 3.5–5.1)
Sodium: 133 mmol/L — ABNORMAL LOW (ref 135–145)

## 2023-12-07 LAB — CALCIUM: Calcium: 8.2 mg/dL — ABNORMAL LOW (ref 8.9–10.3)

## 2023-12-07 LAB — HEPATITIS B SURFACE ANTIBODY, QUANTITATIVE: Hep B S AB Quant (Post): 46.6 m[IU]/mL

## 2023-12-07 MED ORDER — OXYCODONE HCL 5 MG PO TABS: 5.0000 mg | ORAL_TABLET | Freq: Four times a day (QID) | ORAL | 0 refills | Status: AC | PRN

## 2023-12-07 NOTE — Plan of Care (Signed)
  Problem: Education: Goal: Knowledge of General Education information will improve Description: Including pain rating scale, medication(s)/side effects and non-pharmacologic comfort measures Outcome: Adequate for Discharge   Problem: Health Behavior/Discharge Planning: Goal: Ability to manage health-related needs will improve Outcome: Adequate for Discharge   Problem: Clinical Measurements: Goal: Ability to maintain clinical measurements within normal limits will improve Outcome: Adequate for Discharge Goal: Will remain free from infection Outcome: Adequate for Discharge Goal: Diagnostic test results will improve Outcome: Adequate for Discharge Goal: Respiratory complications will improve Outcome: Adequate for Discharge Goal: Cardiovascular complication will be avoided Outcome: Adequate for Discharge   Problem: Activity: Goal: Risk for activity intolerance will decrease Outcome: Adequate for Discharge   Problem: Nutrition: Goal: Adequate nutrition will be maintained Outcome: Adequate for Discharge   Problem: Coping: Goal: Level of anxiety will decrease Outcome: Adequate for Discharge   Problem: Elimination: Goal: Will not experience complications related to bowel motility Outcome: Adequate for Discharge Goal: Will not experience complications related to urinary retention Outcome: Adequate for Discharge   Problem: Pain Managment: Goal: General experience of comfort will improve and/or be controlled Outcome: Adequate for Discharge   Problem: Safety: Goal: Ability to remain free from injury will improve Outcome: Adequate for Discharge   Problem: Skin Integrity: Goal: Risk for impaired skin integrity will decrease Outcome: Adequate for Discharge   Problem: Education: Goal: Required Educational Video(s) Outcome: Adequate for Discharge   Problem: Clinical Measurements: Goal: Postoperative complications will be avoided or minimized Outcome: Adequate for Discharge    Problem: Skin Integrity: Goal: Demonstration of wound healing without infection will improve Outcome: Adequate for Discharge

## 2023-12-07 NOTE — Progress Notes (Signed)
D/C order noted. Contacted [facility] to be advised of pt's d/c today and that pt should resume care tomorrow.   Olivia Canter Renal Navigator [phone]

## 2023-12-07 NOTE — TOC Transition Note (Signed)
Transition of Care [facility]) - Discharge Note   Patient Details  Name: [name] MRN: [id number] Date of Birth: July 28, 1971  Transition of Care [facility]) CM/SW Contact:  Tom-Johnson, Hershal Coria, RN Phone Number: 12/07/2023, 1:21 PM   Clinical Narrative:     Patient is scheduled for discharge today.  Outpatient f/u, hospital f/u and discharge instructions on AVS. No TOC needs or recommendations noted. Family to transport at discharge.  No further TOC needs noted.       Final next level of care: Home/Self Care Barriers to Discharge: Barriers Resolved   Patient Goals and CMS Choice Patient states their goals for this hospitalization and ongoing recovery are:: To return home CMS Medicare.gov Compare Post Acute Care list provided to:: Patient Choice offered to / list presented to : NA      Discharge Placement                Patient to be transferred to facility by: Family      Discharge Plan and Services Additional resources added to the After Visit Summary for                  DME Arranged: N/A         HH Arranged: NA HH Agency: NA        Social Drivers of Health (SDOH) Interventions SDOH Screenings   Food Insecurity: No Food Insecurity (12/06/2023)  Housing: Low Risk  (12/06/2023)  Transportation Needs: No Transportation Needs (12/06/2023)  Utilities: Not At Risk (12/06/2023)  Tobacco Use: High Risk (12/05/2023)     Readmission Risk Interventions     No data to display

## 2023-12-07 NOTE — Discharge Instructions (Signed)
  [facility] -- DISCHARGE INSTRUCTIONS  REMINDER:   Carry a list of your medications and allergies with you at all times  Call your pharmacy at least 1 week in advance to refill prescriptions  Do not mix any prescribed pain medicine with alcohol  Do not drive any motor vehicles while taking pain medication  Take medications with food unless otherwise directed  Follow-up appointments (date to return to physician): Please call [phone] to confirm your follow up appointment with your surgeon.  Call your Surgeon if you have:  Temperature greater than 101.0  Persistent nausea and vomiting  Severe uncontrolled pain  Redness, tenderness, or signs of infection (pain, swelling, redness, odor or green/yellow discharge around the site)  Difficulty breathing, headache or visual disturbances  Hives  Persistent dizziness or light-headedness  Any other questions or concerns you may have after discharge  In an emergency, call 911 or go to an Emergency Department at a nearby hospital.  Diet: Begin with liquids, and if they are tolerated, resume your usual diet.  Avoid spicy, greasy or heavy foods.  If you have nausea or vomiting, go back to liquids.  If you cannot keep liquids down, call your doctor.  Avoid alcohol consumption while on prescription pain medications. Good nutrition promotes healing. Increase fiber and fluids.   ADDITIONAL INSTRUCTIONS: Leave Dermabond in place 7-10 days.  Use ice pack as needed.  May shower.  Rx at pharmacy for pain med at patient request.  [facility] Surgery Office: [phone]

## 2023-12-07 NOTE — Discharge Summary (Signed)
Physician Discharge Summary   Patient ID: [name] MRN: [id number] DOB/AGE: 11-19-1970 53 y.o.  Admit date: 12/05/2023  Discharge date: 12/07/2023  Discharge Diagnoses:  Principal Problem:   Secondary hyperparathyroidism of renal origin [facility])   Discharged Condition: good  Hospital Course: Patient was admitted for observation following parathyroid surgery.  Post op course was uncomplicated.  Pain was well controlled.  Tolerated diet.  Post op calcium level on morning following surgery was 8.3 mg/dl.  Patient was prepared for discharge home on POD#2.  Consults: nephrology  Treatments: surgery: excision parathyroid grafts right forearm  Discharge Exam: Blood pressure 102/61, pulse 86, temperature 97.6 F (36.4 C), temperature source Oral, resp. rate 17, height 5\' 11"  (1.803 m), weight (!) 140.7 kg, SpO2 100%. Right forearm wound dry and intact; mild STS; mild tenderness.  Disposition: Home  Discharge Instructions     Diet - low sodium heart healthy   Complete by: As directed    Increase activity slowly   Complete by: As directed    No dressing needed   Complete by: As directed       Allergies as of 12/07/2023       Reactions   Tape Other (See Comments)   PAPER TAPE, PLEASE!!   Chlorhexidine Itching, Rash, Other (See Comments)   Redness and irritation, also        Medication List     TAKE these medications    acetaminophen 500 MG tablet Commonly known as: TYLENOL Take 1,000 mg by mouth 2 (two) times daily as needed (pain or headaches).   cetirizine 10 MG tablet Commonly known as: ZYRTEC Take 10 mg by mouth daily as needed for allergies.   fluticasone 50 MCG/ACT nasal spray Commonly known as: FLONASE Place 1-2 sprays into both nostrils as needed for allergies.   lactulose (encephalopathy) 10 GM/15ML Soln Commonly known as: CHRONULAC Take 20 g by mouth 2 (two) times daily as needed.   midodrine 10 MG tablet Commonly known as: PROAMATINE Take  15 mg by mouth See admin instructions. Take 1 tablet 15 mg by mouth prior to dialysis and 1 tablet 15 mg half way through dialysis - Monday, Wednesday, Friday   oxyCODONE 5 MG immediate release tablet Commonly known as: Oxy IR/ROXICODONE Take 1 tablet (5 mg total) by mouth every 6 (six) hours as needed for moderate pain (pain score 4-6). What changed: Another medication with the same name was added. Make sure you understand how and when to take each.   oxyCODONE 5 MG immediate release tablet Commonly known as: Oxy IR/ROXICODONE Take 1 tablet (5 mg total) by mouth every 6 (six) hours as needed for moderate pain (pain score 4-6). What changed: You were already taking a medication with the same name, and this prescription was added. Make sure you understand how and when to take each.   Sensipar 30 MG tablet Generic drug: cinacalcet Take 30 mg by mouth daily with breakfast.   Velphoro 500 MG chewable tablet Generic drug: sucroferric oxyhydroxide Chew 1,500-2,000 mg by mouth 3 (three) times daily with meals.   ZANTAC 360 PO Take 1-2 tablets by mouth daily as needed (reflux).               Discharge Care Instructions  (From admission, onward)           Start     Ordered   12/07/23 0000  No dressing needed        12/07/23 1304  Follow-up Information     [name], MD. Schedule an appointment as soon as possible for a visit in 3 week(s).   Specialty: General Surgery Why: For wound re-check Contact information: [address] [place] [address] [phone]                 [name], MD [facility] Office: [phone]   Signed: [name] 12/07/2023, 1:04 PM

## 2023-12-07 NOTE — Progress Notes (Addendum)
[facility] Progress Note   Subjective:    Seen and examined patient at bedside. Current Ca is 8.2 and Corr Ca is 8.7. Patient reports mild constipation but otherwise feels well. Denies any acute symptoms. Tolerated yesterday's HD with net UF 2L. Okay for discharge from a renal standpoint. See below on Ca supplementation plan for outpatient. Reached out to Dr. Ardine Eng office and awaiting a response.  Objective Vitals:   12/06/23 2030 12/06/23 2142 12/07/23 0424 12/07/23 0800  BP: 130/81 126/84 137/81 (!) 106/55  Pulse: 87 96 86 84  Resp: (!) 21 18 18    Temp: 97.8 F (36.6 C) 98.7 F (37.1 C) 98.7 F (37.1 C) 97.7 F (36.5 C)  TempSrc: Oral   Oral  SpO2: 97% 100% 99% 99%  Weight:      Height:       Physical Exam General: Awake, alert, on RA, NAD Heart: S1 and S2; No murmurs, gallops, or rubs Lungs: Clear throughout Abdomen: Soft and non-tender Extremities: No LE edema Dialysis Access: L AVF (+) B/T   Filed Weights   12/05/23 0853 12/06/23 1639  Weight: (!) 136.5 kg (!) 140.7 kg    Intake/Output Summary (Last 24 hours) at 12/07/2023 1100 Last data filed at 12/07/2023 0600 Gross per 24 hour  Intake 120 ml  Output 2000 ml  Net -1880 ml    Additional Objective Labs: Basic Metabolic Panel: Recent Labs  Lab 12/05/23 0933 12/05/23 1809 12/06/23 1747 12/07/23 0740 12/07/23 0741  NA 136  --   --  133*  --   K 3.7  --   --  3.5  --   CL 93*  --   --  93*  --   CO2  --   --   --  27  --   GLUCOSE 84  --   --  94  --   BUN 17  --   --  19  --   CREATININE 9.90*  --   --  8.74*  --   CALCIUM  --    < > 7.8* 8.3* 8.2*  PHOS  --   --   --  3.6  --    < > = values in this interval not displayed.   Liver Function Tests: Recent Labs  Lab 12/07/23 0740  ALBUMIN 3.4*   No results for input(s): "LIPASE", "AMYLASE" in the last 168 hours. CBC: Recent Labs  Lab 12/05/23 0933 12/06/23 1747  WBC  --  9.7  HGB 12.6* 11.8*  HCT 37.0* 33.2*  MCV  --   84.7  PLT  --  183   Blood Culture    Component Value Date/Time   SDES BLOOD RIGHT ARM 10/08/2018 1710   SPECREQUEST  10/08/2018 1710    BOTTLES DRAWN AEROBIC AND ANAEROBIC Blood Culture adequate volume   CULT  10/08/2018 1710    NO GROWTH 5 DAYS Performed at [facility], [address] [place], [place] [address]    REPTSTATUS 10/13/2018 FINAL 10/08/2018 1710    Cardiac Enzymes: No results for input(s): "CKTOTAL", "CKMB", "CKMBINDEX", "TROPONINI" in the last 168 hours. CBG: No results for input(s): "GLUCAP" in the last 168 hours. Iron Studies: No results for input(s): "IRON", "TIBC", "TRANSFERRIN", "FERRITIN" in the last 72 hours. Lab Results  Component Value Date   INR 1.09 10/08/2018   Studies/Results: No results found.  Medications:   calcitRIOL  0.5 mcg Oral BID   calcium carbonate  1,000 mg of elemental calcium  Oral BID    Dialysis Orders: [facility]  MWF 5 hours and 30 minutes  2 K / 2 Ca  250 dialyzer BF 450 DF Auto 1.5 EDW 139.3 kg AV fistula  Note on profile 2 Meds: hectorol 2 mcg three times a week with HD; not on ESA currently  Assessment/Plan: # Secondary hyperparathyroidism, renal  - s/p resection of implanted parathyroid tissue in arm after parathyroidectomy x 2 prior.  Watching for the development of hungry bone - s/p calcium gluconate 1 gram IV once 2/11 - Ca has been running 7.8-8.2 since procedure. Patient reports feeling fine without any acute symptoms. Okay for discharge from a renal standpoint.  -He needs to continue Calcitriol 0.61mcg BID and Calcium Carbonate Os-Cal 2500mg  (1000mg  elemental Ca) BID. Advised he takes this in between meals for proper absorption. -Will send Rx to his outpatient pharmacy -Will check weekly Ca levels and use 3Ca bath at [facility] -Will restart Hectorol IV with HD if we need to    # ESRD  - HD per MWF schedule. Received HD yesterday and 2L was removed. -Next HD 2/14 in  outpatient   # Chronic hypotension - he states he has been off of the pre-HD midodrine for three months - I have discontinued here.  Would not resume on discharge   # Anemia of CKD - Defer ESA for now - Hb 11.8  Salome Holmes, NP [facility] 12/07/2023,11:00 AM  LOS: 0 days

## 2023-12-08 NOTE — Discharge Planning (Addendum)
[facility] Patient Discharge Orders- [facility] CLINIC: [facility]-REVIEW THIS UPDATED VERSION!!!  Patient's name: [name] Admit/DC Dates: 12/05/2023 - 12/07/2023  Discharge Diagnoses: Secondary hyperparathyroidism - S/p resection of implanted parathyroid tissue in arm 2/11 by Dr. Gerrit Friends. Noted he had parathyroidectomy x 2 prior.   ESRD - S/p HD 2/12 and 2L were removed Chronic hypotension - Per patient, has been off of the pre-HD midodrine for three months  so medication discontinued here. Do not resume at discharge for now. Consider restarting if Bps consistently drop during treatment.  Aranesp: Given: No    Last Hgb: 11.8 PRBC's Given: No ESA dose for discharge: N/A IV Iron dose at discharge: N/A  Heparin change: No  EDW Change: No   Bath Change: Change to 3Ca bath for now. Continue 2K bath  Access intervention/Change: No  Hectorol change: Hold Hectorol for now. Will resume if Ca levels trend down. See below for Ca supplementation plan.  Discharge Labs: Calcium: 9.3 (2/11), 8.2 (2/12), 7.8, 8.2; Last Corr Ca 8.68 Phosphorus 3.6  Albumin 3.4  K+ 3.5  IV Antibiotics: No   On Coumadin?: No   OTHER/APPTS/LAB ORDERS:  Please check Ca level pre-HD on 2/14 then weekly thereafter I already sent the following Rx to patient's outpatient pharmacy: Calcitriol 0.17mcg BID and Calcium Carbonate Os-Cal 2500mg  (1000mg  elemental Ca) BID. Educated him that he takes this medication in between meals for proper absorption. Change to 3Ca bath Hold Hectorol with HD for now. Will consider restarting and/or adjust supplementation if Ca levels were to drop Notify the renal team if he has issues getting his supplementation from his local pharmacy Please note, his Ca levels were ranging 7.8-8.2 post op. Patient remained asymptomatic  Consider switching his binders to Calcium Acetate if needed-have renal provider discuss with RD Patient is not on Sensipar which is  good!    D/C Meds to be reconciled by nurse after every discharge.  Completed By: Salome Holmes, NP   Reviewed by: MD:______ RN_______

## 2024-07-22 ENCOUNTER — Encounter (HOSPITAL_BASED_OUTPATIENT_CLINIC_OR_DEPARTMENT_OTHER): Payer: Self-pay | Admitting: Emergency Medicine

## 2024-07-22 ENCOUNTER — Ambulatory Visit (HOSPITAL_BASED_OUTPATIENT_CLINIC_OR_DEPARTMENT_OTHER): Payer: Self-pay | Admitting: Family Medicine

## 2024-07-22 ENCOUNTER — Ambulatory Visit (HOSPITAL_BASED_OUTPATIENT_CLINIC_OR_DEPARTMENT_OTHER): Admitting: Radiology

## 2024-07-22 ENCOUNTER — Ambulatory Visit (HOSPITAL_BASED_OUTPATIENT_CLINIC_OR_DEPARTMENT_OTHER)
Admission: EM | Admit: 2024-07-22 | Discharge: 2024-07-22 | Disposition: A | Discharge status: Home / Self Care | Attending: Family Medicine | Admitting: Family Medicine

## 2024-07-22 DIAGNOSIS — M25562 Pain in left knee: Secondary | ICD-10-CM | POA: Diagnosis not present

## 2024-07-22 DIAGNOSIS — W19XXXA Unspecified fall, initial encounter: Secondary | ICD-10-CM | POA: Diagnosis not present

## 2024-07-22 DIAGNOSIS — Z8781 Personal history of (healed) traumatic fracture: Secondary | ICD-10-CM | POA: Diagnosis not present

## 2024-07-22 DIAGNOSIS — M25462 Effusion, left knee: Secondary | ICD-10-CM | POA: Diagnosis not present

## 2024-07-22 MED ORDER — TRAMADOL HCL 50 MG PO TABS: 50.0000 mg | ORAL_TABLET | Freq: Four times a day (QID) | ORAL | 0 refills | Status: AC | PRN

## 2024-07-22 NOTE — Progress Notes (Signed)
 Probable acute fracture of the left patella bone.  Patient updated via voicemail message and encouraged to follow-up with orthopedics as planned

## 2024-07-22 NOTE — Discharge Instructions (Addendum)
 Previous fracture of left patella in 1986 and acute pain and swelling of left knee: X-rays show an old fracture but I do not see an acute fracture or dislocation on the x-rays.  Will update the patient once radiology has read the films.  Encouraged and elastic support or brace.  We do not have 1 in stock that we will fit the patient right now.  Encouraged ice and elevation.  Patient is on dialysis for chronic kidney failure.  Use tramadol , 50 mg, 1 pill every 6 hours if needed for severe pain.  Otherwise use acetaminophen  as previously prescribed.  Encouraged to contact his orthopedist and follow-up with orthopedics for further evaluation of left knee.  Follow-up here as needed.

## 2024-07-22 NOTE — ED Triage Notes (Signed)
 Pt reports he fell on his left knee on Saturday, he has previously injured this knee in the past. Pt c/o pain to the top part of his knee cap

## 2024-07-22 NOTE — ED Provider Notes (Signed)
 [facility]    CSN: [id number] Arrival date & time: 07/22/24  1211      History   Chief Complaint Chief Complaint  Patient presents with   Knee Pain   Fall    HPI [name] is a 53 y.o. male.   53 year old male who fell and injured his left knee in 28 when he was 53 years old.  He had a patellar fracture at that time.  He fell on Saturday, 07/20/2024 and hurt his knee.  He has had pain and swelling along the bone of the patella.  He is able to walk on it but it is painful when he puts weight on his left leg.   Knee Pain Associated symptoms: no back pain and no fever   Fall Pertinent negatives include no chest pain and no abdominal pain.    Past Medical History:  Diagnosis Date   Anemia    Iron deficiency   Aneurysm 02/15/2007   Right arm large aneurysm   Constipation    End stage renal disease on dialysis [facility])    M-W-F   GERD (gastroesophageal reflux disease)    Hemodialysis finding 02/23/2004   First HD at [facility].   History of bronchitis    Hypertension    Sickle cell trait     Patient Active Problem List   Diagnosis Date Noted   Secondary hyperparathyroidism of renal origin 10/12/2023   Secondary hyperparathyroidism 02/12/2016   Hyperparathyroidism, secondary 02/10/2016   Closed fracture of left patella 08/24/2015   Quadriceps tendon rupture 08/24/2015   Non-healing surgical wound 07/10/2014   Fever in adult 09/10/2013   SIRS (systemic inflammatory response syndrome) (HCC) 09/10/2013   End stage renal disease (HCC) 05/03/2012   Other complications due to renal dialysis device, implant, and graft 05/03/2012    Past Surgical History:  Procedure Laterality Date   AV FISTULA PLACEMENT     AV FISTULA PLACEMENT Left 05/03/2013   Procedure: ARTERIOVENOUS (AV) FISTULA CREATION;  Surgeon: Krystal JULIANNA Doing, MD;  Location: [facility] OR;  Service: Vascular;  Laterality: Left;   BASCILIC VEIN TRANSPOSITION Left 11/28/2016   Procedure: BASCILIC  VEIN TRANSPOSITION- LEFT ARM;  Surgeon: Krystal JULIANNA Doing, MD;  Location: [facility] OR;  Service: Vascular;  Laterality: Left;   COLONOSCOPY  2022   FISTULA SUPERFICIALIZATION Left 07/24/2013   Procedure: FISTULA SUPERFICIALIZATION AND LIGATION OF BRANCHES;  Surgeon: Krystal JULIANNA Doing, MD;  Location: [facility] OR;  Service: Vascular;  Laterality: Left;   FRACTURE SURGERY Left 07/28/2015   Petela Tendon and Fx   INSERTION OF DIALYSIS CATHETER Right 04/22/2013   Procedure: INSERTION OF DIALYSIS CATHETER;  Surgeon: Krystal JULIANNA Doing, MD;  Location: [facility] OR;  Service: Vascular;  Laterality: Right;   IR DIALY SHUNT INTRO NEEDLE/INTRACATH INITIAL W/IMG LEFT Left 04/06/2023   IR US  GUIDE VASC ACCESS LEFT  04/06/2023   LIGATION OF ARTERIOVENOUS  FISTULA Right 04/22/2013   Procedure: LIGATION OF ARTERIOVENOUS  FISTULA  AND RESECTION OF VENOUS ANEURYSM AND ABSCESS;  Surgeon: Krystal JULIANNA Doing, MD;  Location: [facility] OR;  Service: Vascular;  Laterality: Right;   PARATHYROIDECTOMY N/A 02/12/2016   Procedure:  TOTAL PARATHYROIDECTOMY AUTOTRANSPLANT TO RIGHT FOREARM ;  Surgeon: [name], MD;  Location: [facility] OR;  Service: General;  Laterality: N/A;   PARATHYROIDECTOMY N/A 10/12/2023   Procedure: NECK EXPLORATION WITH PARATHYROIDECTOMY;  Surgeon: Spinner Krystal, MD;  Location: [facility] OR;  Service: General;  Laterality: N/A;   PARATHYROIDECTOMY Right 12/05/2023   Procedure: EXCISION  OF PARATHYROID  GRAFTS FROM RIGHT FOREARM;  Surgeon: Eletha Boas, MD;  Location: [facility] OR;  Service: General;  Laterality: Right;   QUADRICEPS TENDON REPAIR Left 08/24/2015   Procedure: REPAIR OF LEFT QUADRICEP TENDON;  Surgeon: Dempsey Sensor, MD;  Location: [facility] OR;  Service: Orthopedics;  Laterality: Left;   REMOVAL OF A DIALYSIS CATHETER  10/24/2012   TEE WITHOUT CARDIOVERSION N/A 09/13/2013   Procedure: TRANSESOPHAGEAL ECHOCARDIOGRAM (TEE);  Surgeon: Jerel Balding, MD;  Location: [facility] ENDOSCOPY;  Service: Cardiovascular;  Laterality: N/A;       Home Medications    Prior to  Admission medications   Medication Sig Start Date End Date Taking? Authorizing Provider  cetirizine (ZYRTEC) 10 MG tablet Take 10 mg by mouth daily as needed for allergies. 05/08/23  Yes [provider]  midodrine  (PROAMATINE ) 10 MG tablet Take 15 mg by mouth See admin instructions. Take 1 tablet 15 mg by mouth prior to dialysis and 1 tablet 15 mg half way through dialysis - Monday, Wednesday, Friday   Yes [provider]  traMADol  (ULTRAM ) 50 MG tablet Take 1 tablet (50 mg total) by mouth every 6 (six) hours as needed. 07/22/24  Yes Ival Domino, FNP  acetaminophen  (TYLENOL ) 500 MG tablet Take 1,000 mg by mouth 2 (two) times daily as needed (pain or headaches).    [provider]  Famotidine  (ZANTAC 360 PO) Take 1-2 tablets by mouth daily as needed (reflux).    [provider]  fluticasone (FLONASE) 50 MCG/ACT nasal spray Place 1-2 sprays into both nostrils as needed for allergies. 07/13/20   [provider]  lactulose, encephalopathy, (CHRONULAC) 10 GM/15ML SOLN Take 20 g by mouth 2 (two) times daily as needed. 11/20/23   [provider]  oxyCODONE  (OXY IR/ROXICODONE ) 5 MG immediate release tablet Take 1 tablet (5 mg total) by mouth every 6 (six) hours as needed for moderate pain (pain score 4-6). 10/13/23   Eletha Boas, MD  oxyCODONE  (OXY IR/ROXICODONE ) 5 MG immediate release tablet Take 1 tablet (5 mg total) by mouth every 6 (six) hours as needed for moderate pain (pain score 4-6). 12/07/23   Eletha Boas, MD  SENSIPAR  30 MG tablet Take 30 mg by mouth daily with breakfast. 11/01/23   [provider]  VELPHORO  500 MG chewable tablet Chew 1,500-2,000 mg by mouth 3 (three) times daily with meals. 06/19/23   [provider]    Family History Family History  Problem Relation Age of Onset   Hypertension Mother    Heart disease Mother        before age 62   Other Brother        DVT    Social History Social History   Tobacco  Use   Smoking status: Light Smoker    Types: Cigars   Smokeless tobacco: Never   Tobacco comments:    pt states he smokes about 2 cigars per year  Vaping Use   Vaping status: Never Used  Substance Use Topics   Alcohol use: Yes    Comment: rarely   Drug use: No     Allergies   Tape and Chlorhexidine    Review of Systems Review of Systems  Constitutional:  Negative for fever.  Respiratory:  Negative for cough.   Cardiovascular:  Negative for chest pain.  Gastrointestinal:  Negative for abdominal pain, constipation, diarrhea, nausea and vomiting.  Musculoskeletal:  Positive for joint swelling (Left knee). Negative for arthralgias and back pain.  Skin:  Negative for color change  and rash.  Neurological:  Negative for syncope.  All other systems reviewed and are negative.    Physical Exam Triage Vital Signs ED Triage Vitals  Encounter Vitals Group     BP 07/22/24 1233 (!) 148/98     Girls Systolic BP Percentile --      Girls Diastolic BP Percentile --      Boys Systolic BP Percentile --      Boys Diastolic BP Percentile --      Pulse Rate 07/22/24 1233 70     Resp 07/22/24 1233 20     Temp 07/22/24 1233 97.6 F (36.4 C)     Temp Source 07/22/24 1233 Oral     SpO2 07/22/24 1233 98 %     Weight --      Height --      Head Circumference --      Peak Flow --      Pain Score 07/22/24 1231 7     Pain Loc --      Pain Education --      Exclude from Growth Chart --    No data found.  Updated Vital Signs BP (!) 148/98 (BP Location: Right Wrist)   Pulse 70   Temp 97.6 F (36.4 C) (Oral)   Resp 20   SpO2 98%   Visual Acuity Right Eye Distance:   Left Eye Distance:   Bilateral Distance:    Right Eye Near:   Left Eye Near:    Bilateral Near:     Physical Exam Vitals and nursing note reviewed.  Constitutional:      General: He is not in acute distress.    Appearance: He is well-developed. He is not ill-appearing or toxic-appearing.  HENT:     Head:  Normocephalic and atraumatic.     Right Ear: External ear normal.     Left Ear: External ear normal.     Nose: Nose normal.     Mouth/Throat:     Lips: Pink.     Mouth: Mucous membranes are moist.  Eyes:     Conjunctiva/sclera: Conjunctivae normal.     Pupils: Pupils are equal, round, and reactive to light.  Cardiovascular:     Rate and Rhythm: Normal rate and regular rhythm.     Heart sounds: S1 normal and S2 normal. No murmur heard. Pulmonary:     Effort: Pulmonary effort is normal. No respiratory distress.     Breath sounds: Normal breath sounds. No decreased breath sounds, wheezing, rhonchi or rales.  Musculoskeletal:        General: No swelling.     Right knee: Normal.     Left knee: Swelling present. No deformity, effusion, erythema, ecchymosis, lacerations, bony tenderness or crepitus. Decreased range of motion (Decreased extension and flexion due to pain). No tenderness. No LCL laxity, MCL laxity, ACL laxity or PCL laxity.Normal alignment, normal meniscus and normal patellar mobility. Normal pulse.     Comments: Unable to reproduce tenderness during the exam but he has left knee tenderness with weightbearing and walking.  See photo for more information  Skin:    General: Skin is warm and dry.     Capillary Refill: Capillary refill takes less than 2 seconds.     Findings: No rash.  Neurological:     Mental Status: He is alert and oriented to person, place, and time.  Psychiatric:        Mood and Affect: Mood normal.  UC Treatments / Results  Labs (all labs ordered are listed, but only abnormal results are displayed) Labs Reviewed - No data to display  EKG   Radiology No results found.  Procedures Procedures (including critical care time)  Medications Ordered in UC Medications - No data to display  Initial Impression / Assessment and Plan / UC Course  I have reviewed the triage vital signs and the nursing notes.  Pertinent labs & imaging results  that were available during my care of the patient were reviewed by me and considered in my medical decision making (see chart for details).  Plan of Care: Previous fracture of left patella in 1986 and acute pain and swelling of left knee: X-rays show an old fracture but I do not see an acute fracture or dislocation on the x-rays.  Will update the patient once radiology has read the films.  Encouraged and elastic support or brace.  We do not have 1 in stock that we will fit the patient right now.  Encouraged ice and elevation.  Patient is on dialysis for chronic kidney failure.  Use tramadol , 50 mg, 1 pill every 6 hours if needed for severe pain.  Otherwise use acetaminophen  as previously prescribed.  Encouraged to contact his orthopedist and follow-up with orthopedics for further evaluation of left knee.  Follow-up here as needed.  I reviewed the plan of care with the patient and/or the patient's guardian.  The patient and/or guardian had time to ask questions and acknowledged that the questions were answered.  I provided instruction on symptoms or reasons to return here or to go to an ER, if symptoms/condition did not improve, worsened or if new symptoms occurred.  Final Clinical Impressions(s) / UC Diagnoses   Final diagnoses:  Pain and swelling of left knee  History of patellar fracture     Discharge Instructions      Previous fracture of left patella in 1986 and acute pain and swelling of left knee: X-rays show an old fracture but I do not see an acute fracture or dislocation on the x-rays.  Will update the patient once radiology has read the films.  Encouraged and elastic support or brace.  We do not have 1 in stock that we will fit the patient right now.  Encouraged ice and elevation.  Patient is on dialysis for chronic kidney failure.  Use tramadol , 50 mg, 1 pill every 6 hours if needed for severe pain.  Otherwise use acetaminophen  as previously prescribed.  Encouraged to contact his  orthopedist and follow-up with orthopedics for further evaluation of left knee.  Follow-up here as needed.     ED Prescriptions     Medication Sig Dispense Auth. Provider   traMADol  (ULTRAM ) 50 MG tablet Take 1 tablet (50 mg total) by mouth every 6 (six) hours as needed. 10 tablet Akshita Italiano, FNP      I have reviewed the PDMP during this encounter.   Ival Domino, FNP 07/22/24 1325
